# Patient Record
Sex: Female | Born: 1935 | Race: Black or African American | Hispanic: No | Marital: Married | Smoking: Never smoker
Health system: Southern US, Community
[De-identification: ages and names within clinical notes are randomized; demographics above are authoritative.]

## PROBLEM LIST (undated history)

## (undated) DIAGNOSIS — R350 Frequency of micturition: Secondary | ICD-10-CM

## (undated) DIAGNOSIS — G629 Polyneuropathy, unspecified: Secondary | ICD-10-CM

## (undated) DIAGNOSIS — Z8619 Personal history of other infectious and parasitic diseases: Secondary | ICD-10-CM

## (undated) DIAGNOSIS — T7840XA Allergy, unspecified, initial encounter: Secondary | ICD-10-CM

## (undated) DIAGNOSIS — I10 Essential (primary) hypertension: Secondary | ICD-10-CM

## (undated) DIAGNOSIS — R002 Palpitations: Secondary | ICD-10-CM

## (undated) DIAGNOSIS — I639 Cerebral infarction, unspecified: Secondary | ICD-10-CM

## (undated) DIAGNOSIS — I872 Venous insufficiency (chronic) (peripheral): Secondary | ICD-10-CM

## (undated) DIAGNOSIS — E7800 Pure hypercholesterolemia, unspecified: Secondary | ICD-10-CM

## (undated) DIAGNOSIS — M1990 Unspecified osteoarthritis, unspecified site: Secondary | ICD-10-CM

## (undated) DIAGNOSIS — F419 Anxiety disorder, unspecified: Secondary | ICD-10-CM

## (undated) DIAGNOSIS — R011 Cardiac murmur, unspecified: Secondary | ICD-10-CM

## (undated) DIAGNOSIS — K5730 Diverticulosis of large intestine without perforation or abscess without bleeding: Secondary | ICD-10-CM

## (undated) DIAGNOSIS — Z87440 Personal history of urinary (tract) infections: Secondary | ICD-10-CM

## (undated) DIAGNOSIS — K219 Gastro-esophageal reflux disease without esophagitis: Secondary | ICD-10-CM

## (undated) HISTORY — DX: Gastro-esophageal reflux disease without esophagitis: K21.9

## (undated) HISTORY — DX: Diverticulosis of large intestine without perforation or abscess without bleeding: K57.30

## (undated) HISTORY — DX: Allergy, unspecified, initial encounter: T78.40XA

## (undated) HISTORY — DX: Venous insufficiency (chronic) (peripheral): I87.2

## (undated) HISTORY — DX: Pure hypercholesterolemia, unspecified: E78.00

## (undated) HISTORY — PX: TONSILLECTOMY: SUR1361

## (undated) HISTORY — DX: Essential (primary) hypertension: I10

## (undated) HISTORY — DX: Unspecified osteoarthritis, unspecified site: M19.90

## (undated) HISTORY — DX: Frequency of micturition: R35.0

## (undated) HISTORY — DX: Personal history of other infectious and parasitic diseases: Z86.19

## (undated) HISTORY — PX: VESICOVAGINAL FISTULA CLOSURE W/ TAH: SUR271

## (undated) HISTORY — DX: Palpitations: R00.2

## (undated) HISTORY — DX: Personal history of urinary (tract) infections: Z87.440

## (undated) HISTORY — DX: Polyneuropathy, unspecified: G62.9

## (undated) HISTORY — PX: ABDOMINAL HYSTERECTOMY: SHX81

## (undated) HISTORY — DX: Anxiety disorder, unspecified: F41.9

---

## 1998-09-23 ENCOUNTER — Emergency Department (HOSPITAL_COMMUNITY): Admission: EM | Admit: 1998-09-23 | Discharge: 1998-09-23 | Payer: Self-pay | Admitting: Emergency Medicine

## 1999-06-12 ENCOUNTER — Ambulatory Visit (HOSPITAL_COMMUNITY): Admission: RE | Admit: 1999-06-12 | Discharge: 1999-06-12 | Payer: Self-pay | Admitting: Internal Medicine

## 1999-06-12 ENCOUNTER — Encounter: Payer: Self-pay | Admitting: Internal Medicine

## 2000-11-19 ENCOUNTER — Ambulatory Visit (HOSPITAL_COMMUNITY): Admission: RE | Admit: 2000-11-19 | Discharge: 2000-11-19 | Payer: Self-pay | Admitting: Pulmonary Disease

## 2000-11-19 ENCOUNTER — Encounter: Payer: Self-pay | Admitting: Pulmonary Disease

## 2000-11-26 ENCOUNTER — Encounter: Payer: Self-pay | Admitting: Pulmonary Disease

## 2000-11-26 ENCOUNTER — Ambulatory Visit (HOSPITAL_COMMUNITY): Admission: RE | Admit: 2000-11-26 | Discharge: 2000-11-26 | Payer: Self-pay | Admitting: Pulmonary Disease

## 2002-01-17 ENCOUNTER — Emergency Department (HOSPITAL_COMMUNITY): Admission: EM | Admit: 2002-01-17 | Discharge: 2002-01-17 | Payer: Self-pay | Admitting: Emergency Medicine

## 2004-07-31 ENCOUNTER — Emergency Department (HOSPITAL_COMMUNITY): Admission: EM | Admit: 2004-07-31 | Discharge: 2004-07-31 | Payer: Self-pay | Admitting: Emergency Medicine

## 2004-09-11 ENCOUNTER — Ambulatory Visit (HOSPITAL_COMMUNITY): Admission: RE | Admit: 2004-09-11 | Discharge: 2004-09-11 | Payer: Self-pay | Admitting: Internal Medicine

## 2004-10-31 ENCOUNTER — Ambulatory Visit: Payer: Self-pay | Admitting: Pulmonary Disease

## 2005-02-28 ENCOUNTER — Ambulatory Visit: Payer: Self-pay | Admitting: Pulmonary Disease

## 2005-05-01 ENCOUNTER — Ambulatory Visit: Payer: Self-pay | Admitting: Pulmonary Disease

## 2005-06-26 ENCOUNTER — Ambulatory Visit: Payer: Self-pay | Admitting: Pulmonary Disease

## 2005-07-01 ENCOUNTER — Encounter: Admission: RE | Admit: 2005-07-01 | Discharge: 2005-07-01 | Payer: Self-pay | Admitting: Pulmonary Disease

## 2005-07-23 ENCOUNTER — Ambulatory Visit: Payer: Self-pay | Admitting: Pulmonary Disease

## 2005-08-06 ENCOUNTER — Ambulatory Visit: Payer: Self-pay | Admitting: Pulmonary Disease

## 2005-08-15 ENCOUNTER — Ambulatory Visit: Payer: Self-pay

## 2005-08-23 ENCOUNTER — Ambulatory Visit: Payer: Self-pay | Admitting: Pulmonary Disease

## 2005-10-14 ENCOUNTER — Ambulatory Visit: Payer: Self-pay | Admitting: Pulmonary Disease

## 2006-01-13 ENCOUNTER — Ambulatory Visit: Payer: Self-pay | Admitting: Pulmonary Disease

## 2006-02-25 ENCOUNTER — Ambulatory Visit: Payer: Self-pay | Admitting: Pulmonary Disease

## 2006-02-25 ENCOUNTER — Ambulatory Visit (HOSPITAL_COMMUNITY): Admission: RE | Admit: 2006-02-25 | Discharge: 2006-02-25 | Payer: Self-pay | Admitting: Pulmonary Disease

## 2006-03-19 ENCOUNTER — Ambulatory Visit: Payer: Self-pay | Admitting: Pulmonary Disease

## 2006-05-13 ENCOUNTER — Ambulatory Visit: Payer: Self-pay | Admitting: Pulmonary Disease

## 2006-06-04 ENCOUNTER — Ambulatory Visit: Payer: Self-pay

## 2006-06-04 ENCOUNTER — Encounter: Payer: Self-pay | Admitting: Cardiovascular Disease

## 2006-06-20 ENCOUNTER — Ambulatory Visit: Payer: Self-pay | Admitting: Pulmonary Disease

## 2006-08-13 ENCOUNTER — Encounter: Admission: RE | Admit: 2006-08-13 | Discharge: 2006-08-13 | Payer: Self-pay | Admitting: Pulmonary Disease

## 2006-09-10 ENCOUNTER — Ambulatory Visit: Payer: Self-pay | Admitting: Pulmonary Disease

## 2006-09-29 ENCOUNTER — Ambulatory Visit: Payer: Self-pay | Admitting: Pulmonary Disease

## 2006-10-17 ENCOUNTER — Ambulatory Visit: Payer: Self-pay | Admitting: Pulmonary Disease

## 2006-10-17 LAB — CONVERTED CEMR LAB
Bilirubin Urine: NEGATIVE
CO2: 33 meq/L — ABNORMAL HIGH (ref 19–32)
Glucose, Bld: 110 mg/dL — ABNORMAL HIGH (ref 70–99)
Ketones, ur: NEGATIVE mg/dL
Nitrite: NEGATIVE
Phosphorus Concentration: 2.4 mg/dL (ref 2.3–4.6)
Potassium: 3.9 meq/L (ref 3.5–5.1)
Total Protein, Urine: NEGATIVE mg/dL
Urine Glucose: NEGATIVE mg/dL
Urobilinogen, UA: 0.2 (ref 0.0–1.0)
pH: 6 (ref 5.0–8.0)

## 2006-11-17 ENCOUNTER — Ambulatory Visit: Payer: Self-pay | Admitting: Pulmonary Disease

## 2006-11-17 LAB — CONVERTED CEMR LAB
AST: 29 units/L (ref 0–37)
BUN: 11 mg/dL (ref 6–23)
CO2: 35 meq/L — ABNORMAL HIGH (ref 19–32)
Calcium: 10.7 mg/dL — ABNORMAL HIGH (ref 8.4–10.5)
Creatinine, Ser: 0.9 mg/dL (ref 0.4–1.2)
GFR calc non Af Amer: 62 mL/min
Glomerular Filtration Rate, Af Am: 75 mL/min/{1.73_m2}
Total Protein: 6.3 g/dL (ref 6.0–8.3)

## 2006-12-18 ENCOUNTER — Ambulatory Visit: Payer: Self-pay | Admitting: Pulmonary Disease

## 2007-01-12 ENCOUNTER — Ambulatory Visit: Payer: Self-pay | Admitting: Pulmonary Disease

## 2007-01-12 LAB — CONVERTED CEMR LAB
ALT: 16 units/L (ref 0–40)
BUN: 8 mg/dL (ref 6–23)
Basophils Absolute: 0.1 10*3/uL (ref 0.0–0.1)
Basophils Relative: 1.3 % — ABNORMAL HIGH (ref 0.0–1.0)
Calcium: 10.6 mg/dL — ABNORMAL HIGH (ref 8.4–10.5)
Chloride: 106 meq/L (ref 96–112)
Cholesterol: 253 mg/dL (ref 0–200)
Direct LDL: 92.6 mg/dL
Eosinophils Relative: 8.9 % — ABNORMAL HIGH (ref 0.0–5.0)
Glucose, Bld: 95 mg/dL (ref 70–99)
Hemoglobin: 14.5 g/dL (ref 12.0–15.0)
Lymphocytes Relative: 46.7 % — ABNORMAL HIGH (ref 12.0–46.0)
MCV: 84.7 fL (ref 78.0–100.0)
Monocytes Absolute: 0.5 10*3/uL (ref 0.2–0.7)
Neutrophils Relative %: 34.5 % — ABNORMAL LOW (ref 43.0–77.0)
Platelets: 227 10*3/uL (ref 150–400)
Potassium: 3.6 meq/L (ref 3.5–5.1)
TSH: 2.03 microintl units/mL (ref 0.35–5.50)
Total CHOL/HDL Ratio: 2.1
Total Protein: 7 g/dL (ref 6.0–8.3)
Triglycerides: 125 mg/dL (ref 0–149)
WBC: 5.6 10*3/uL (ref 4.5–10.5)

## 2007-05-12 ENCOUNTER — Ambulatory Visit: Payer: Self-pay | Admitting: Pulmonary Disease

## 2007-07-02 ENCOUNTER — Ambulatory Visit: Payer: Self-pay | Admitting: Pulmonary Disease

## 2007-07-02 LAB — CONVERTED CEMR LAB
BUN: 9 mg/dL (ref 6–23)
Basophils Relative: 0.4 % (ref 0.0–1.0)
CO2: 36 meq/L — ABNORMAL HIGH (ref 19–32)
Calcium: 10.4 mg/dL (ref 8.4–10.5)
Creatinine, Ser: 0.9 mg/dL (ref 0.4–1.2)
Crystals: NEGATIVE
Eosinophils Absolute: 0.4 10*3/uL (ref 0.0–0.6)
Eosinophils Relative: 6.8 % — ABNORMAL HIGH (ref 0.0–5.0)
Glucose, Bld: 87 mg/dL (ref 70–99)
HCT: 38.9 % (ref 36.0–46.0)
Hemoglobin: 13.3 g/dL (ref 12.0–15.0)
Ketones, ur: NEGATIVE mg/dL
Lymphocytes Relative: 39.8 % (ref 12.0–46.0)
MCV: 84.3 fL (ref 78.0–100.0)
Monocytes Absolute: 0.5 10*3/uL (ref 0.2–0.7)
Neutro Abs: 2.5 10*3/uL (ref 1.4–7.7)
Neutrophils Relative %: 43.4 % (ref 43.0–77.0)
Total Protein, Urine: NEGATIVE mg/dL
Urine Glucose: NEGATIVE mg/dL
Urobilinogen, UA: 0.2 (ref 0.0–1.0)
WBC: 5.6 10*3/uL (ref 4.5–10.5)

## 2007-07-03 ENCOUNTER — Encounter: Payer: Self-pay | Admitting: Pulmonary Disease

## 2007-07-30 ENCOUNTER — Ambulatory Visit: Payer: Self-pay | Admitting: Pulmonary Disease

## 2007-08-10 ENCOUNTER — Ambulatory Visit: Payer: Self-pay | Admitting: Pulmonary Disease

## 2007-09-23 ENCOUNTER — Encounter: Admission: RE | Admit: 2007-09-23 | Discharge: 2007-09-23 | Payer: Self-pay | Admitting: Pulmonary Disease

## 2007-10-28 ENCOUNTER — Ambulatory Visit: Payer: Self-pay | Admitting: Pulmonary Disease

## 2008-01-26 ENCOUNTER — Ambulatory Visit: Payer: Self-pay | Admitting: Pulmonary Disease

## 2008-02-07 DIAGNOSIS — F0390 Unspecified dementia without behavioral disturbance: Secondary | ICD-10-CM

## 2008-02-07 DIAGNOSIS — I10 Essential (primary) hypertension: Secondary | ICD-10-CM

## 2008-02-07 DIAGNOSIS — J209 Acute bronchitis, unspecified: Secondary | ICD-10-CM | POA: Insufficient documentation

## 2008-02-07 DIAGNOSIS — M810 Age-related osteoporosis without current pathological fracture: Secondary | ICD-10-CM | POA: Insufficient documentation

## 2008-02-07 DIAGNOSIS — E7800 Pure hypercholesterolemia, unspecified: Secondary | ICD-10-CM | POA: Insufficient documentation

## 2008-02-07 DIAGNOSIS — F411 Generalized anxiety disorder: Secondary | ICD-10-CM | POA: Insufficient documentation

## 2008-02-07 DIAGNOSIS — M1990 Unspecified osteoarthritis, unspecified site: Secondary | ICD-10-CM

## 2008-02-07 DIAGNOSIS — K5730 Diverticulosis of large intestine without perforation or abscess without bleeding: Secondary | ICD-10-CM | POA: Insufficient documentation

## 2008-02-07 LAB — CONVERTED CEMR LAB
Albumin: 3.9 g/dL (ref 3.5–5.2)
Basophils Absolute: 0 10*3/uL (ref 0.0–0.1)
Basophils Relative: 0.4 % (ref 0.0–1.0)
Bilirubin, Direct: 0.3 mg/dL (ref 0.0–0.3)
Eosinophils Absolute: 0.2 10*3/uL (ref 0.0–0.6)
Eosinophils Relative: 4.4 % (ref 0.0–5.0)
GFR calc non Af Amer: 62 mL/min
Glucose, Bld: 79 mg/dL (ref 70–99)
HDL: 135.9 mg/dL (ref >39.0)
LDL Cholesterol: 44 mg/dL (ref 0–99)
Monocytes Relative: 8.2 % (ref 3.0–11.0)
Neutro Abs: 2.2 10*3/uL (ref 1.4–7.7)
Platelets: 205 10*3/uL (ref 150–400)
Potassium: 3.9 meq/L (ref 3.5–5.1)
RBC: 4.79 M/uL (ref 3.87–5.11)
RDW: 13.1 % (ref 11.5–14.6)
Sodium: 143 meq/L (ref 135–145)
TSH: 1.67 microintl units/mL (ref 0.35–5.50)
Triglycerides: 99 mg/dL (ref 0–149)
VLDL: 20 mg/dL (ref 0–40)
Vit D, 1,25-Dihydroxy: 34 (ref 30–89)
WBC: 5.3 10*3/uL (ref 4.5–10.5)

## 2008-02-09 ENCOUNTER — Telehealth (INDEPENDENT_AMBULATORY_CARE_PROVIDER_SITE_OTHER): Payer: Self-pay | Admitting: *Deleted

## 2008-04-01 ENCOUNTER — Ambulatory Visit: Payer: Self-pay | Admitting: Pulmonary Disease

## 2008-06-01 ENCOUNTER — Telehealth (INDEPENDENT_AMBULATORY_CARE_PROVIDER_SITE_OTHER): Payer: Self-pay | Admitting: *Deleted

## 2008-06-13 ENCOUNTER — Ambulatory Visit: Payer: Self-pay | Admitting: Pulmonary Disease

## 2008-08-19 ENCOUNTER — Ambulatory Visit: Payer: Self-pay | Admitting: Internal Medicine

## 2008-08-19 DIAGNOSIS — I872 Venous insufficiency (chronic) (peripheral): Secondary | ICD-10-CM

## 2008-08-19 LAB — CONVERTED CEMR LAB
Bacteria, UA: NEGATIVE
Crystals: NEGATIVE
Hemoglobin, Urine: NEGATIVE
Ketones, ur: NEGATIVE mg/dL
RBC / HPF: NONE SEEN
Specific Gravity, Urine: 1.01 (ref 1.000–1.03)
Urobilinogen, UA: 0.2 (ref 0.0–1.0)

## 2008-08-20 ENCOUNTER — Encounter: Payer: Self-pay | Admitting: Adult Health

## 2008-09-29 ENCOUNTER — Encounter: Admission: RE | Admit: 2008-09-29 | Discharge: 2008-09-29 | Payer: Self-pay | Admitting: Pulmonary Disease

## 2008-10-11 ENCOUNTER — Ambulatory Visit: Payer: Self-pay | Admitting: Pulmonary Disease

## 2009-02-02 ENCOUNTER — Ambulatory Visit: Payer: Self-pay | Admitting: Pulmonary Disease

## 2009-03-16 ENCOUNTER — Telehealth (INDEPENDENT_AMBULATORY_CARE_PROVIDER_SITE_OTHER): Payer: Self-pay | Admitting: *Deleted

## 2009-03-31 ENCOUNTER — Telehealth (INDEPENDENT_AMBULATORY_CARE_PROVIDER_SITE_OTHER): Payer: Self-pay | Admitting: *Deleted

## 2009-04-05 ENCOUNTER — Ambulatory Visit: Payer: Self-pay | Admitting: Internal Medicine

## 2009-04-05 ENCOUNTER — Encounter: Payer: Self-pay | Admitting: Adult Health

## 2009-04-11 ENCOUNTER — Telehealth (INDEPENDENT_AMBULATORY_CARE_PROVIDER_SITE_OTHER): Payer: Self-pay | Admitting: *Deleted

## 2009-04-11 LAB — CONVERTED CEMR LAB
BUN: 11 mg/dL (ref 6–23)
Basophils Absolute: 0 10*3/uL (ref 0.0–0.1)
Calcium: 10.5 mg/dL (ref 8.4–10.5)
Creatinine, Ser: 0.8 mg/dL (ref 0.4–1.2)
Eosinophils Relative: 4.8 % (ref 0.0–5.0)
GFR calc non Af Amer: 85.63 mL/min (ref >60)
Glucose, Bld: 78 mg/dL (ref 70–99)
HCT: 39.3 % (ref 36.0–46.0)
Lymphocytes Relative: 33.5 % (ref 12.0–46.0)
Lymphs Abs: 1.8 10*3/uL (ref 0.7–4.0)
Monocytes Relative: 8.8 % (ref 3.0–12.0)
Neutrophils Relative %: 52.7 % (ref 43.0–77.0)
Platelets: 204 10*3/uL (ref 150.0–400.0)
Potassium: 3.8 meq/L (ref 3.5–5.1)
RDW: 13.8 % (ref 11.5–14.6)
TSH: 1.53 microintl units/mL (ref 0.35–5.50)
Vit D, 25-Hydroxy: 26 ng/mL — ABNORMAL LOW (ref 30–89)
WBC: 5.4 10*3/uL (ref 4.5–10.5)

## 2009-04-13 ENCOUNTER — Encounter: Payer: Self-pay | Admitting: Adult Health

## 2009-04-18 ENCOUNTER — Telehealth (INDEPENDENT_AMBULATORY_CARE_PROVIDER_SITE_OTHER): Payer: Self-pay | Admitting: *Deleted

## 2009-05-23 ENCOUNTER — Telehealth (INDEPENDENT_AMBULATORY_CARE_PROVIDER_SITE_OTHER): Payer: Self-pay | Admitting: *Deleted

## 2009-05-24 ENCOUNTER — Ambulatory Visit: Payer: Self-pay | Admitting: Pulmonary Disease

## 2009-06-21 ENCOUNTER — Ambulatory Visit: Payer: Self-pay | Admitting: Pulmonary Disease

## 2009-06-21 DIAGNOSIS — J069 Acute upper respiratory infection, unspecified: Secondary | ICD-10-CM | POA: Insufficient documentation

## 2009-07-03 ENCOUNTER — Ambulatory Visit: Payer: Self-pay | Admitting: Pulmonary Disease

## 2009-07-03 ENCOUNTER — Telehealth (INDEPENDENT_AMBULATORY_CARE_PROVIDER_SITE_OTHER): Payer: Self-pay | Admitting: *Deleted

## 2009-07-04 ENCOUNTER — Ambulatory Visit: Payer: Self-pay | Admitting: Pulmonary Disease

## 2009-07-05 ENCOUNTER — Ambulatory Visit: Payer: Self-pay | Admitting: Cardiovascular Disease

## 2009-07-06 LAB — CONVERTED CEMR LAB
ALT: 18 U/L
AST: 32 U/L
Albumin: 4.2 g/dL
Alkaline Phosphatase: 53 U/L
BUN: 18 mg/dL
Bilirubin, Direct: 0.2 mg/dL
CO2: 32 meq/L
Calcium: 10.8 mg/dL — ABNORMAL HIGH
Chloride: 100 meq/L
Creatinine, Ser: 0.8 mg/dL
GFR calc non Af Amer: 85.58 mL/min
Glucose, Bld: 95 mg/dL
HCT: 42.5 %
Hemoglobin: 13.9 g/dL
MCHC: 32.7 g/dL
MCV: 86.8 fL
Platelets: 225 K/uL
Potassium: 5 meq/L
RBC: 4.9 M/uL
RDW: 13.4 %
Sed Rate: 9 mm/h
Sodium: 140 meq/L
TSH: 2.43 u[IU]/mL
Total Bilirubin: 1 mg/dL
Total Protein: 7.2 g/dL
WBC: 6.8 10*3/microliter

## 2009-07-26 ENCOUNTER — Ambulatory Visit: Payer: Self-pay | Admitting: Adult Health

## 2009-07-26 ENCOUNTER — Encounter (INDEPENDENT_AMBULATORY_CARE_PROVIDER_SITE_OTHER): Payer: Self-pay | Admitting: *Deleted

## 2009-07-26 DIAGNOSIS — R002 Palpitations: Secondary | ICD-10-CM | POA: Insufficient documentation

## 2009-07-28 ENCOUNTER — Telehealth (INDEPENDENT_AMBULATORY_CARE_PROVIDER_SITE_OTHER): Payer: Self-pay | Admitting: *Deleted

## 2009-07-28 ENCOUNTER — Other Ambulatory Visit: Payer: Self-pay | Admitting: Emergency Medicine

## 2009-07-28 ENCOUNTER — Inpatient Hospital Stay (HOSPITAL_COMMUNITY): Admission: EM | Admit: 2009-07-28 | Discharge: 2009-07-29 | Payer: Self-pay | Admitting: Emergency Medicine

## 2009-07-28 ENCOUNTER — Encounter: Payer: Self-pay | Admitting: Cardiovascular Disease

## 2009-07-28 ENCOUNTER — Ambulatory Visit: Payer: Self-pay | Admitting: Cardiovascular Disease

## 2009-08-01 ENCOUNTER — Telehealth: Payer: Self-pay | Admitting: Pulmonary Disease

## 2009-08-02 ENCOUNTER — Ambulatory Visit: Payer: Self-pay | Admitting: Cardiovascular Disease

## 2009-08-09 ENCOUNTER — Telehealth: Payer: Self-pay | Admitting: Pulmonary Disease

## 2009-08-14 ENCOUNTER — Telehealth: Payer: Self-pay | Admitting: Cardiovascular Disease

## 2009-08-17 ENCOUNTER — Telehealth (INDEPENDENT_AMBULATORY_CARE_PROVIDER_SITE_OTHER): Payer: Self-pay | Admitting: *Deleted

## 2009-08-23 ENCOUNTER — Ambulatory Visit: Payer: Self-pay | Admitting: Pulmonary Disease

## 2009-09-11 ENCOUNTER — Ambulatory Visit: Payer: Self-pay | Admitting: Pulmonary Disease

## 2009-09-11 ENCOUNTER — Ambulatory Visit: Payer: Self-pay | Admitting: Cardiovascular Disease

## 2009-09-20 ENCOUNTER — Ambulatory Visit: Payer: Self-pay | Admitting: Pulmonary Disease

## 2009-09-20 DIAGNOSIS — R32 Unspecified urinary incontinence: Secondary | ICD-10-CM

## 2009-10-05 ENCOUNTER — Encounter: Admission: RE | Admit: 2009-10-05 | Discharge: 2009-10-05 | Payer: Self-pay | Admitting: Pulmonary Disease

## 2009-10-17 ENCOUNTER — Ambulatory Visit: Payer: Self-pay | Admitting: Pulmonary Disease

## 2009-10-26 ENCOUNTER — Encounter: Payer: Self-pay | Admitting: Pulmonary Disease

## 2009-10-30 ENCOUNTER — Encounter: Payer: Self-pay | Admitting: Pulmonary Disease

## 2009-11-02 ENCOUNTER — Telehealth: Payer: Self-pay | Admitting: Pulmonary Disease

## 2009-12-04 ENCOUNTER — Encounter: Payer: Self-pay | Admitting: Pulmonary Disease

## 2009-12-27 ENCOUNTER — Ambulatory Visit: Payer: Self-pay | Admitting: Pulmonary Disease

## 2010-02-08 ENCOUNTER — Encounter: Payer: Self-pay | Admitting: Pulmonary Disease

## 2010-02-27 ENCOUNTER — Encounter: Payer: Self-pay | Admitting: Pulmonary Disease

## 2010-02-28 ENCOUNTER — Encounter: Payer: Self-pay | Admitting: Pulmonary Disease

## 2010-03-09 ENCOUNTER — Telehealth (INDEPENDENT_AMBULATORY_CARE_PROVIDER_SITE_OTHER): Payer: Self-pay | Admitting: *Deleted

## 2010-03-13 ENCOUNTER — Encounter: Payer: Self-pay | Admitting: Pulmonary Disease

## 2010-03-28 ENCOUNTER — Ambulatory Visit: Payer: Self-pay | Admitting: Pulmonary Disease

## 2010-03-29 ENCOUNTER — Encounter: Payer: Self-pay | Admitting: Pulmonary Disease

## 2010-04-01 LAB — CONVERTED CEMR LAB
ALT: 19 units/L (ref 0–35)
AST: 30 units/L (ref 0–37)
Albumin: 4.1 g/dL (ref 3.5–5.2)
BUN: 10 mg/dL (ref 6–23)
Basophils Relative: 1 % (ref 0.0–3.0)
Chloride: 101 meq/L (ref 96–112)
Cholesterol: 244 mg/dL — ABNORMAL HIGH (ref 0–200)
Creatinine, Ser: 0.9 mg/dL (ref 0.4–1.2)
Eosinophils Relative: 3.8 % (ref 0.0–5.0)
GFR calc non Af Amer: 74.59 mL/min (ref >60)
Glucose, Bld: 91 mg/dL (ref 70–99)
HDL: 149 mg/dL (ref >39.00)
Lymphocytes Relative: 31.7 % (ref 12.0–46.0)
Monocytes Relative: 7.3 % (ref 3.0–12.0)
Neutrophils Relative %: 56.2 % (ref 43.0–77.0)
Platelets: 225 10*3/uL (ref 150.0–400.0)
Potassium: 3.7 meq/L (ref 3.5–5.1)
RBC: 4.75 M/uL (ref 3.87–5.11)
Total CHOL/HDL Ratio: 2
Triglycerides: 143 mg/dL (ref 0.0–149.0)
VLDL: 28.6 mg/dL (ref 0.0–40.0)
WBC: 4.8 10*3/uL (ref 4.5–10.5)

## 2010-04-10 ENCOUNTER — Telehealth: Payer: Self-pay | Admitting: Pulmonary Disease

## 2010-04-30 ENCOUNTER — Telehealth: Payer: Self-pay | Admitting: Pulmonary Disease

## 2010-05-01 ENCOUNTER — Telehealth (INDEPENDENT_AMBULATORY_CARE_PROVIDER_SITE_OTHER): Payer: Self-pay | Admitting: *Deleted

## 2010-05-01 DIAGNOSIS — G629 Polyneuropathy, unspecified: Secondary | ICD-10-CM

## 2010-05-08 ENCOUNTER — Encounter: Payer: Self-pay | Admitting: Pulmonary Disease

## 2010-05-10 ENCOUNTER — Telehealth (INDEPENDENT_AMBULATORY_CARE_PROVIDER_SITE_OTHER): Payer: Self-pay | Admitting: *Deleted

## 2010-05-11 ENCOUNTER — Ambulatory Visit: Payer: Self-pay

## 2010-05-11 ENCOUNTER — Ambulatory Visit: Payer: Self-pay | Admitting: Pulmonary Disease

## 2010-05-11 ENCOUNTER — Encounter: Payer: Self-pay | Admitting: Adult Health

## 2010-05-17 LAB — CONVERTED CEMR LAB
BUN: 17 mg/dL (ref 6–23)
CO2: 35 meq/L — ABNORMAL HIGH (ref 19–32)
Calcium: 11.2 mg/dL — ABNORMAL HIGH (ref 8.4–10.5)
Creatinine, Ser: 0.9 mg/dL (ref 0.4–1.2)
Glucose, Bld: 78 mg/dL (ref 70–99)
Sodium: 145 meq/L (ref 135–145)
Uric Acid, Serum: 3.6 mg/dL (ref 2.4–7.0)

## 2010-05-24 ENCOUNTER — Ambulatory Visit: Payer: Self-pay | Admitting: Pulmonary Disease

## 2010-05-30 ENCOUNTER — Telehealth (INDEPENDENT_AMBULATORY_CARE_PROVIDER_SITE_OTHER): Payer: Self-pay | Admitting: *Deleted

## 2010-06-04 ENCOUNTER — Encounter: Payer: Self-pay | Admitting: Pulmonary Disease

## 2010-06-04 ENCOUNTER — Telehealth (INDEPENDENT_AMBULATORY_CARE_PROVIDER_SITE_OTHER): Payer: Self-pay | Admitting: *Deleted

## 2010-06-08 ENCOUNTER — Telehealth: Payer: Self-pay | Admitting: Pulmonary Disease

## 2010-06-13 ENCOUNTER — Ambulatory Visit: Payer: Self-pay | Admitting: Pulmonary Disease

## 2010-07-05 ENCOUNTER — Telehealth: Payer: Self-pay | Admitting: Pulmonary Disease

## 2010-07-17 ENCOUNTER — Encounter: Payer: Self-pay | Admitting: Pulmonary Disease

## 2010-07-19 ENCOUNTER — Encounter: Payer: Self-pay | Admitting: Pulmonary Disease

## 2010-08-02 ENCOUNTER — Telehealth (INDEPENDENT_AMBULATORY_CARE_PROVIDER_SITE_OTHER): Payer: Self-pay | Admitting: *Deleted

## 2010-09-18 ENCOUNTER — Ambulatory Visit: Payer: Self-pay | Admitting: Pulmonary Disease

## 2010-10-03 ENCOUNTER — Telehealth: Payer: Self-pay | Admitting: Pulmonary Disease

## 2010-10-11 ENCOUNTER — Encounter: Admission: RE | Admit: 2010-10-11 | Discharge: 2010-10-11 | Payer: Self-pay | Admitting: Pulmonary Disease

## 2010-10-11 ENCOUNTER — Ambulatory Visit: Payer: Self-pay | Admitting: Pulmonary Disease

## 2010-10-22 ENCOUNTER — Telehealth (INDEPENDENT_AMBULATORY_CARE_PROVIDER_SITE_OTHER): Payer: Self-pay | Admitting: *Deleted

## 2010-10-25 ENCOUNTER — Encounter: Payer: Self-pay | Admitting: Pulmonary Disease

## 2010-11-30 ENCOUNTER — Ambulatory Visit: Payer: Self-pay | Admitting: Pulmonary Disease

## 2010-11-30 ENCOUNTER — Telehealth: Payer: Self-pay | Admitting: Pulmonary Disease

## 2010-12-26 ENCOUNTER — Other Ambulatory Visit: Payer: Self-pay | Admitting: Pulmonary Disease

## 2010-12-26 ENCOUNTER — Ambulatory Visit
Admission: RE | Admit: 2010-12-26 | Discharge: 2010-12-26 | Payer: Self-pay | Source: Home / Self Care | Attending: Pulmonary Disease | Admitting: Pulmonary Disease

## 2010-12-26 LAB — LIPID PANEL
Cholesterol: 266 mg/dL — ABNORMAL HIGH (ref 0–200)
HDL: 150.8 mg/dL (ref >39.00)
Total CHOL/HDL Ratio: 2
Triglycerides: 102 mg/dL (ref 0.0–149.0)
VLDL: 20.4 mg/dL (ref 0.0–40.0)

## 2010-12-26 LAB — CBC WITH DIFFERENTIAL/PLATELET
Basophils Absolute: 0.1 10*3/uL (ref 0.0–0.1)
Basophils Relative: 1 % (ref 0.0–3.0)
Eosinophils Absolute: 0.1 10*3/uL (ref 0.0–0.7)
Eosinophils Relative: 1.6 % (ref 0.0–5.0)
HCT: 40.3 % (ref 36.0–46.0)
Hemoglobin: 13.4 g/dL (ref 12.0–15.0)
Lymphocytes Relative: 34.5 % (ref 12.0–46.0)
Lymphs Abs: 1.8 10*3/uL (ref 0.7–4.0)
MCHC: 33.3 g/dL (ref 30.0–36.0)
MCV: 87.8 fl (ref 78.0–100.0)
Monocytes Absolute: 0.5 10*3/uL (ref 0.1–1.0)
Monocytes Relative: 9.9 % (ref 3.0–12.0)
Neutro Abs: 2.8 10*3/uL (ref 1.4–7.7)
Neutrophils Relative %: 53 % (ref 43.0–77.0)
Platelets: 211 10*3/uL (ref 150.0–400.0)
RBC: 4.59 Mil/uL (ref 3.87–5.11)
RDW: 14.8 % — ABNORMAL HIGH (ref 11.5–14.6)
WBC: 5.4 10*3/uL (ref 4.5–10.5)

## 2010-12-26 LAB — BASIC METABOLIC PANEL
BUN: 16 mg/dL (ref 6–23)
CO2: 32 mEq/L (ref 19–32)
Calcium: 11.7 mg/dL — ABNORMAL HIGH (ref 8.4–10.5)
Chloride: 104 mEq/L (ref 96–112)
Creatinine, Ser: 0.9 mg/dL (ref 0.4–1.2)
GFR: 73.53 mL/min (ref >60.00)
Glucose, Bld: 72 mg/dL (ref 70–99)
Potassium: 4 mEq/L (ref 3.5–5.1)
Sodium: 144 mEq/L (ref 135–145)

## 2010-12-26 LAB — LDL CHOLESTEROL, DIRECT: Direct LDL: 82.9 mg/dL

## 2010-12-26 LAB — HEPATIC FUNCTION PANEL
ALT: 13 U/L (ref 0–35)
AST: 25 U/L (ref 0–37)
Albumin: 4.1 g/dL (ref 3.5–5.2)
Alkaline Phosphatase: 54 U/L (ref 39–117)
Bilirubin, Direct: 0.1 mg/dL (ref 0.0–0.3)
Total Bilirubin: 0.6 mg/dL (ref 0.3–1.2)
Total Protein: 6.5 g/dL (ref 6.0–8.3)

## 2010-12-26 LAB — TSH: TSH: 1.49 u[IU]/mL (ref 0.35–5.50)

## 2011-01-06 ENCOUNTER — Encounter: Payer: Self-pay | Admitting: Pulmonary Disease

## 2011-01-06 ENCOUNTER — Encounter: Payer: Self-pay | Admitting: Internal Medicine

## 2011-01-15 NOTE — Assessment & Plan Note (Signed)
Summary: flu shot/mhh  Nurse Visit   Allergies: No Known Drug Allergies  Orders Added: 1)  Flu Vaccine 77yrs + MEDICARE PATIENTS [Q2039] 2)  Administration Flu vaccine - MCR [G0008] Flu Vaccine Consent Questions     Do you have a history of severe allergic reactions to this vaccine? no    Any prior history of allergic reactions to egg and/or gelatin? no    Do you have a sensitivity to the preservative Thimersol? no    Do you have a past history of Guillan-Barre Syndrome? no    Do you currently have an acute febrile illness? no    Have you ever had a severe reaction to latex? no    Vaccine information given and explained to patient? yes    Are you currently pregnant? no    Lot Number:[id number]   Exp Date:06/15/2011   Site Given  Left Deltoid IMu  Clarise Cruz [facility])  September 18, 2010 2:08 PM

## 2011-01-15 NOTE — Progress Notes (Signed)
Summary: elevated BP  Phone Note Call from Patient Call back at Home Phone [phone]   Caller: PT - Jessica [name] Call For: Jessica [name] Reason for Call: Talk to Nurse Summary of Call: Pt's BP was 182/85 before they started therapy.  Pt has taken her BP med for today, she takes another one before bed.  Please advise pt. Initial call taken by: Eugene Gavia,  March 09, 2010 3:41 PM  Follow-up for Phone Call        called and spoke with pt.  pt states PT took her bp recently today and it was 185/89.  Pt states she took her Norvasc 5mg  this morning as scheduled and will take the Lisinopril 20mg  this evening.  Pt states this is the first day her bp was this elevated.  pt denied headache or blurred vision.  pt also mentioned that she was very nervous today and hadn't taken her Xanax yet when she had her bp checked.  Pt has since taken her Xanax and feels less nervous..  I informed pt that the one-time increase in her bp probably was d/t her anxiousness/nervousness but will check with SN to get his opinion.  please advise.  Aundra Millet Reynolds LPN  March 09, 2010 4:50 PM   Additional Follow-up for Phone Call Additional follow up Details #1::        per SN----one time BP being elevated is not a big concern---need to know if her BP is still elevated now or if it has come back down. if her BP is still elevated then have her take another norvasc 5mg  and take the lisinopril at bedtime and keep a check on her BP at home.  called pt to get more info but no one is at home and unable to leave a message. Randell Loop CMA  March 09, 2010 4:58 PM     Additional Follow-up for Phone Call Additional follow up Details #2::    called but no answer x 2.   Randell Loop CMA  March 13, 2010 2:19 PM     128/65 bp today---just checked earlier by scott and pt stated that she has been doing pretty good with her BP. Randell Loop CMA  March 13, 2010 4:07 PM

## 2011-01-15 NOTE — Assessment & Plan Note (Signed)
Summary: NP follow up - left leg edema   Primary Provider/Referring Provider:  Alroy Dust, MD  CC:  2 week follow up for left leg edema .  History of Present Illness: 77 year old female with known hx HTN, osteoporosis, DJD, dementia.    ~  Jun10:  follow up visit w/ mult somatic complaints:  1) swelling in legs that tends to decr overnight, on Lasix 20mg /d & we discussed no salt & incr Lasix to 40mg /d;  2) LBP when getting up in the AM- asked to check her mattress, Rx ESTylenol;  3) nasal drainage x 90mo- we discussed treatment regimen w/ Zyrtek, Saline, Astepro Prn...   ~  Jul10:  walk-in today (here w/ her daughter) w/ mult somatic complaints- weight loss, weakness, intermittent dizzy & ringing, persistant bladder problems, etc... she states appetite is fair & review of chart shows weights over the last yr betw 123-129#... today 117# w/ 1-2+edema in feet at present... we prev discussed supplements but she isn't taking them regularly... she is concerned and wants further eval... she has been weak & intermiitently dizzy- BP is trending low on her meds and we discussed decreasing Lisinopril20- 1/2 tab, Amlodipine5- 1/2 tab, Lasix20- 1tab daily...   July 26, 2009 - acute OV - pt presents with 1 week hx intermittent visible "rapid fluttering" of the vessels in her antecubital area.     May 11, 2010--Presents for increased swelling and pain iin ankles/feet. She has a hx of venous insufficiency w/ ankle edema on lasix 20mg  once daily. This is not getting any better and feet hurt daily. She went to neuro this past week for possible neuropathy but was told she did not  have neuropathy per pt. (no records available yet). Swelling worse in evening, sore around ankles , top of feet and pain w/ walking at times. Feet are getting so big in evening she cant fit into shoes.   May 24, 2010--Presents for a  2 week follow up for left leg edema. Last visit labs w/ good renal fxn, nml bnp, venous doppler neg for  DVT. Felt this was secondary to venous insufficiency. Advised on extra diuretics, low salt diet and teds stocking and norvasc stopped ?edema side effect. She has decreased swelling in am, extra lasix helped. Denies chest pain.   Medications Prior to Update: 1)  [facility]vs Saline Nasal Spray 0.65 % Soln (Saline) .... As Needed 2)  Adult Aspirin Low Strength 81 Mg  Tbdp (Aspirin) .Marland Kitchen.. 1 By Mouth Once Daily 3)  Norvasc 5 Mg  Tabs (Amlodipine Besylate) .... Take 1 Tab By Mouth Once Daily 4)  Lisinopril 20 Mg  Tabs (Lisinopril) .... Take 1 Tablet By Mouth Once A Day 5)  Furosemide 20 Mg  Tabs (Furosemide) .... Take 1/2 To 1 Tab By Mouth Each Morning For Swelling... 6)  Caltrate 600+d 600-400 Mg-Unit  Tabs (Calcium Carbonate-Vitamin D) .Marland Kitchen.. 1 Tab Twice Daily.Marland KitchenMarland Kitchen 7)  Multivitamins  Tabs (Multiple Vitamin) .... Take 1 Tablet By Mouth Once A Day 8)  Vitamin D 1000 Unit Caps (Cholecalciferol) .... Take 1 Cap By Mouth Once Daily.Marland KitchenMarland Kitchen 9)  Aricept 10 Mg  Tabs (Donepezil Hcl) .Marland Kitchen.. 1 By Mouth Once Daily 10)  Alprazolam 0.25 Mg  Tabs (Alprazolam) .... 1/2 To 1 Tab By Mouth Three Times A Day As Needed For Nerves...  Current Medications (verified): 1)  Cvs Saline Nasal Spray 0.65 % Soln (Saline) .... As Needed 2)  Adult Aspirin Low Strength 81 Mg  Tbdp (Aspirin) .Marland KitchenMarland KitchenMarland Kitchen  1 By Mouth Once Daily 3)  Norvasc 5 Mg  Tabs (Amlodipine Besylate) .... Take 1 Tab By Mouth Once Daily 4)  Lisinopril 20 Mg  Tabs (Lisinopril) .... Take 1 Tablet By Mouth Once A Day 5)  Furosemide 20 Mg  Tabs (Furosemide) .... Take 1/2 To 1 Tab By Mouth Each Morning For Swelling... 6)  Caltrate 600+d 600-400 Mg-Unit  Tabs (Calcium Carbonate-Vitamin D) .Marland Kitchen.. 1 Tab Twice Daily.Marland KitchenMarland Kitchen 7)  Multivitamins  Tabs (Multiple Vitamin) .... Take 1 Tablet By Mouth Once A Day 8)  Vitamin D 1000 Unit Caps (Cholecalciferol) .... Take 1 Cap By Mouth Once Daily.Marland KitchenMarland Kitchen 9)  Aricept 10 Mg  Tabs (Donepezil Hcl) .Marland Kitchen.. 1 By Mouth Once Daily 10)  Alprazolam 0.25 Mg  Tabs (Alprazolam) ....  1/2 To 1 Tab By Mouth Three Times A Day As Needed For Nerves...  Allergies (verified): No Known Drug Allergies  Past History:  Family History: Last updated: 08/26/2009  Both of her parents are deceased and her father had a  stroke, but neither one had heart disease.  She has one brother who died  at age 75 of pneumonia.      Social History: Last updated: 08/26/2009  She lives in [place] with her husband.  They have  been married almost 60 years.  She is a retired Runner, broadcasting/film/video.  She has no  history of alcohol, tobacco, or drug abuse.   Risk Factors: Smoking Status: never (02/02/2009)  Past Medical History: Hx of ALLERGIES (ICD-995.3) - she uses OTC meds as needed, & we prev discussed Zyrtek 10mg Qam, Saline nasal mist, & Astepro 1-2 sp Bid for drainage (one of her chronic complaints)...  Hx of ASTHMATIC BRONCHITIS, ACUTE (ICD-466.0) - no recent exacerbations...  HYPERTENSION (ICD-401.9) - controlled  on meds   VENOUS INSUFFICIENCY, CHRONIC (ICD-459.81) - chronic pedal edema on . on low sodium diet, elevation, support hose- and lasix 20mg  once daily --norvasc stopped 05/2010.   HYPERCHOLESTEROLEMIA (ICD-272.0) - prev on Lipitor 10mg /d, but she stopped this on her own... she has a very high HDL!!!  ~  FLP was 1/08 showed TChol 253, TG 125, HDL 121, LDL 93  ~  FLP 2/09 showed TChol 200, TG 99, HDL 136, LDL 44  ~  FLP 4/11 showed TChol 244, TG 143, HDL 149, LDL 59  GERD (ICD-530.81) - last EGD was 9/05 by DrPerry showing GERD and esoph stricture- dilated...  DIVERTICULOSIS OF COLON (ICD-562.10) - last colonoscopy was 11/00 showing divertics only...  HX, URINARY INFECTION (ICD-V13.02) & INCONTINENCE symptoms... she has seen DrTannenbaum & staff    ~  7/10:  offered f/u appt w/ Urology vs second opinion consult but she declines...  ~  9/10:  wrote for trial Enablex 7.5mg /d >> no benefit she says.  ~  12/10: saw DrNesi- tried sample med, sl improved, but symptoms not bad enough for more  meds, she says.  DEGENERATIVE JOINT DISEASE (ICD-715.90) -      Review of Systems      See HPI  Vital Signs:  Patient profile:   77 year old female Height:      60 inches Weight:      121 pounds BMI:     23.72 O2 Sat:      97 % on Room air Temp:     97.3 degrees F oral Pulse rate:   60 / minute BP sitting:   120 / 70  (left arm) Cuff size:   regular  Vitals Entered By: Boone Master CNA/MA (May 24, 2010 10:10 AM)  O2 Flow:  Room air CC: 2 week follow up for left leg edema  Is Patient Diabetic? No Comments Medications reviewed with patient Daytime contact number verified with patient. Boone Master CNA/MA  May 24, 2010 10:09 AM    Physical Exam  Additional Exam:  WD, WN, 77 y/o BF in NAD.Marland Kitchenappears younger than stated age.  GENERAL:  Alert, pleasant & cooperative HEENT:  EACs-clear, TMs-wnl, NOSE-clear, THROAT-clear & wnl, mm moist NECK:  Supple, no JVD; normal carotid impulses w/o bruits; no thyromegaly or nodules palpated; no lymphadenopathy. CHEST:  Clear to P & A; without wheezes/ rales/ or rhonchi., noted kyphosis .  HEART:  Regular Rhythm; without murmurs/ rubs/ or gallops. ABDOMEN:  Soft & nontender; normal bowel sounds; no organomegaly or masses detected. EXT: without deformities, mod arthritic changes; no varicose veins/ +venous insuffic. 1-2+ BLE edema along ankles/feet, neg homans sign, pulses intact bialterall NEURO:   Alert, answers questions appropriately.  no focal neuro deficits... DERM:  warm and dry, No lesions noted; no rash etc...     Impression & Recommendations:  Problem # 1:  VENOUS INSUFFICIENCY, CHRONIC (ICD-459.81)  Chronic venous insufficiency of lower ext. w/ neg neuro w/up for neuropathy, neg venous doppler, labs unrevealing.  REC:  May take Furosemide 20mg  1-2 tabs once daily for swelling.   low salt diet, keep legs elevated. support stocking daily and remove at bedtime.  follow up Dr. Kriste Basque as scheduled  Please contact office for  sooner follow up if symptoms do not improve or worsen   Orders: Est. Patient Level II (27253)  Problem # 2:  HYPERTENSION (ICD-401.9) controlled off norvasc The following medications were removed from the medication list:    Norvasc 5 Mg Tabs (Amlodipine besylate) .Marland Kitchen... Take 1 tab by mouth once daily Her updated medication list for this problem includes:    Lisinopril 20 Mg Tabs (Lisinopril) .Marland Kitchen... Take 1 tablet by mouth once a day    Furosemide 20 Mg Tabs (Furosemide) .Marland Kitchen... Take 1/2 to 1 tab by mouth each morning for swelling...  Orders: Est. Patient Level II (66440)  Complete Medication List: 1)  Cvs Saline Nasal Spray 0.65 % Soln (Saline) .... As needed 2)  Adult Aspirin Low Strength 81 Mg Tbdp (Aspirin) .Marland Kitchen.. 1 by mouth once daily 3)  Lisinopril 20 Mg Tabs (Lisinopril) .... Take 1 tablet by mouth once a day 4)  Furosemide 20 Mg Tabs (Furosemide) .... Take 1/2 to 1 tab by mouth each morning for swelling... 5)  Caltrate 600+d 600-400 Mg-unit Tabs (Calcium carbonate-vitamin d) .Marland Kitchen.. 1 tab twice daily.Marland KitchenMarland Kitchen 6)  Multivitamins Tabs (Multiple vitamin) .... Take 1 tablet by mouth once a day 7)  Vitamin D 1000 Unit Caps (Cholecalciferol) .... Take 1 cap by mouth once daily.Marland KitchenMarland Kitchen 8)  Aricept 10 Mg Tabs (Donepezil hcl) .Marland Kitchen.. 1 by mouth once daily 9)  Alprazolam 0.25 Mg Tabs (Alprazolam) .... 1/2 to 1 tab by mouth three times a day as needed for nerves...  Patient Instructions: 1)  May take Furosemide 20mg  1-2 tabs once daily for swelling.   2)  low salt diet, keep legs elevated. support stocking daily and remove at bedtime.  3)  follow up Dr. Kriste Basque as scheduled  4)  Please contact office for sooner follow up if symptoms do not improve or worsen

## 2011-01-15 NOTE — Miscellaneous (Signed)
Summary: Order/[facility]  Order/[facility]   Imported By: Lester  08/01/2010 08:26:38  _____________________________________________________________________  External Attachment:    Type:   Image     Comment:   External Document

## 2011-01-15 NOTE — Progress Notes (Signed)
Summary: r hip pain  Phone Note Call from Patient Call back at Home Phone [phone]   Caller: Patient Call For: nadel Summary of Call: pt was recently seen by dr nadel on 03/28/10. says she told sn about her r hip pain. however in the past few days it is "giving her a fit". thinks she should have an xray asap.  Initial call taken by: Tivis Ringer, CNA,  April 10, 2010 9:41 AM  Follow-up for Phone Call        pt c/o right hip pain that has gotten worse over the last few days. She is requesting referral to ortho. Per SN ok to refer. Pt request Dr. Darrelyn Hillock. Order placed. Carron Curie CMA  April 10, 2010 10:23 AM

## 2011-01-15 NOTE — Miscellaneous (Signed)
Summary: Order/[facility]  Order/[facility]   Imported By: Lester [place] 04/03/2010 11:08:14  _____________________________________________________________________  External Attachment:    Type:   Image     Comment:   External Document

## 2011-01-15 NOTE — Miscellaneous (Signed)
Summary: PT Orders/[facility]  PT Orders/[facility]   Imported By: Lanelle Bal 03/29/2010 09:46:21  _____________________________________________________________________  External Attachment:    Type:   Image     Comment:   External Document

## 2011-01-15 NOTE — Assessment & Plan Note (Signed)
Summary: 3 month return/mhh   Primary Care Provider:  Alroy Dust, MD  CC:  3 month ROV & review of mult medical problems....  History of Present Illness: 77 y/o BF here for a follow up visit... he has multiple medical problems as noted below...  she is slow moving and deliberate in her actions, but she assures me that she and her husb are doing satis and safe at home... they do not want to consider alternative living arrangements...   ~  seen Oct09 c/o urinary symptoms- "gotta go"... she's drinking fluids all day long and c/o nocturia 1-4 times/night, no dysuria, no incontinence, hasn't seen GYN in yrs but saw DrNesi in 2008, given "pill" without relief...  we decided to alter her fluid intake pattern, and try Oxybutynin 5mg  Qhs but this didn't help... she saw DrTannenbaum et al & they tried phys therapy exercises and "a salve" w/ some transient improvement... she persists w/ these complaints but doesn't want to go back to Urology ("there's nothing they can do") or get second opinion in [place] etc...   ~  Jun10:  follow up visit w/ mult somatic complaints:  1) swelling in legs that tends to decr overnight, on Lasix 20mg /d & we discussed no salt & incr Lasix to 40mg /d;  2) LBP when getting up in the AM- asked to check her mattress, Rx ESTylenol;  3) nasal drainage x 36mo- we discussed treatment regimen w/ Zyrtek, Saline, Astepro Prn...   ~  July 03, 2009:  walk-in today (here w/ her daughter) w/ mult somatic complaints- weight loss, weakness, intermittent dizzy & ringing, persistant bladder problems, etc... she states appetite is fair & review of chart shows weights over the last yr betw 123-129#... today 117# w/ 1-2+edema in feet at present... we prev discussed supplements but she isn't taking them regularly... she is concerned and wants further eval... she has been weak & intermiitently dizzy- BP is trending low on her meds and we discussed decreasing Lisinopril20- 1/2 tab, Amlodipine5- 1/2 tab, Lasix20-  1tab daily...       ** Labs- all WNL, sed= 9.      ** CXR- clear, NAD, kyphoscoliosis...     ** CT Chest/ Abd/ Pelvis= NEG- 3 tiny 3mm right lung nodules (likely granulomas), tiny cysts in liver (otherw neg abd), sm ing hernias (otherw neg pelvis).   ~  August 23, 2009:  she presented to ER in Aug w/ concern for prominent brachial pulses ("I never saw those before, and I was concerned")- this got translated into palpitations & she was adm 8/14-15 for monitoring & r/o MI per cards...       ** EKG showed sinus brady, RBBB, NAD...  Labs normal & MI was R/O...      ** 2DEcho showed biatrial enlarg, norm LVF w/ mod LVH, sclerotic AV w/ mild AI & MR, norm RV. She has been fine since disch & I explained "hardening of the arteries" to her... she and husb are getting weaker & having more difficulty managing on their own- I have encouraged them to discuss w/ family re: alternative living arrangements.   ~  September 20, 2009:  add-on for persistant mult somatic complaints- discussed w/ pt & daughter... notes runny nose (Astepro helps), burning in feet c/w neuropathy but doesn't want new meds (offered Neurontin), want phys therapy at home for balance issues, & wants f/u DrNesi for bladder issues...   ~  December 27, 2009:  she's had a good 70mo- still "nervous &  weak" but balance improved w/ home phys therapy & she does exercises MWF w/ the TV!!!  she had f/u DrNesi for bladder issues- no new meds, states it's not too bad & doesn't want more meds...    Current Problem List:  Hx of ALLERGIES (ICD-995.3) - she uses OTC meds as needed, & we prev discussed Zyrtek 10mg Qam, Saline nasal mist, & Astepro 1-2 sp Bid for drainage (one of her chronic complaints)...  Hx of ASTHMATIC BRONCHITIS, ACUTE (ICD-466.0) - no recent exaserbations...  HYPERTENSION (ICD-401.9) - controlled on LISINOPRIL 20mg - 1 daily, NORVASC 5mg - 1daily, & FUROSEMIDE 20mg - 1/2 to 1 tab each AM... BP= 122/64 and she states that she is taking her  meds regularly & tolerating them well... denies HA, visual changes, CP, palpit, change in dyspnea, etc... but notes weakness, intermittently dizzy & persist edema in her feet...   VENOUS INSUFFICIENCY, CHRONIC (ICD-459.81) - there is mild pedal edema bilat... on low sodium diet, elevation, support hose- prev refused diuretic meds due to urinary symptoms, now on LASIX 20mg - 1/2 to 1Qam...  HYPERCHOLESTEROLEMIA (ICD-272.0) - prev on Lipitor 10mg /d, but she stopped this on her own... she has a very high HDL!!!  ~  FLP was 1/08 showed TChol 253, TG 125, HDL 121, LDL 93...  ~  FLP 2/09 showed TChol 200, TG 99, HDL 136, LDL 44...  GERD (ICD-530.81) - last EGD was 9/05 by DrPerry showing GERD and esoph stricture- dilated... she stopped her Nexium but states that her swallowing is good & she uses PRILOSEC as needed.  DIVERTICULOSIS OF COLON (ICD-562.10) - last colonoscopy was 11/00 showing divertics only...  HX, URINARY INFECTION (ICD-V13.02) & INCONTINENCE symptoms... she has seen DrTannenbaum & staff on bladder exercises and she reported some improvement but has persistant symptoms & never followed up...  ~  7/10:  offered f/u appt w/ Urology vs second opinion consult but she declines...  ~  9/10:  wrote for trial Enablex 7.5mg /d>>> no benefit she says.  ~  12/10: saw DrNesi- tried sample med, sl improved, but symptoms not bad enough for more meds, she says.  DEGENERATIVE JOINT DISEASE (ICD-715.90) - notes right shoulder symptoms, but managing OK w/ Tylenol...  OSTEOPOROSIS (ICD-733.00) - she was on Fosamax but had discontinued this on her own... we discussed the need for continued therapy and she restarted generic FOSAMAX, along w/ ca++, MVI, VitD...  SENILE DEMENTIA (ICD-290.0) - she takes ASA 81mg /d... she remains on ARICEPT 10mg /d and appears stable...  ANXIETY (ICD-300.00) - she uses Alpraz 0.25mg  as needed.  Hx of SHINGLES (ICD-053.9)    Allergies (verified): No Known Drug  Allergies  Comments:  Nurse/Medical Assistant: The patient's medications and allergies were reviewed with the patient and were updated in the Medication and Allergy Lists.  Past History:  Past Medical History:  Hx of ALLERGIES (ICD-995.3) Hx of ASTHMATIC BRONCHITIS, ACUTE (ICD-466.0) HYPERTENSION (ICD-401.9) PALPITATIONS (ICD-785.1) VENOUS INSUFFICIENCY, CHRONIC (ICD-459.81) HYPERCHOLESTEROLEMIA (ICD-272.0) GERD (ICD-530.81) DIVERTICULOSIS OF COLON (ICD-562.10) HX, URINARY INFECTION (ICD-V13.02) FREQUENCY, URINARY (ICD-788.41) URINARY INCONTINENCE (ICD-788.30) DEGENERATIVE JOINT DISEASE (ICD-715.90) OSTEOPOROSIS (ICD-733.00) SENILE DEMENTIA (ICD-290.0) ANXIETY (ICD-300.00) Hx of SHINGLES (ICD-053.9)  Past Surgical History: Hysterectomy Tonsillectomy  Family History: Reviewed history from 08/26/2009 and no changes required.  Both of her parents are deceased and her father had a  stroke, but neither one had heart disease.  She has one brother who died  at age 14 of pneumonia.      Social History: Reviewed history from 08/26/2009 and no changes required.  She lives  in [place] with her husband.  They have  been married almost 60 years.  She is a retired Runner, broadcasting/film/video.  She has no  history of alcohol, tobacco, or drug abuse.   Review of Systems      See HPI       The patient complains of decreased hearing, dyspnea on exertion, peripheral edema, incontinence, muscle weakness, and difficulty walking.  The patient denies anorexia, fever, weight loss, weight gain, vision loss, hoarseness, chest pain, syncope, prolonged cough, headaches, hemoptysis, abdominal pain, melena, hematochezia, severe indigestion/heartburn, hematuria, suspicious skin lesions, transient blindness, depression, unusual weight change, abnormal bleeding, enlarged lymph nodes, and angioedema.    Vital Signs:  Patient profile:   77 year old female Height:      60 inches Weight:      118.38 pounds O2 Sat:       100 % on Room air Temp:     98.0 degrees F oral Pulse rate:   51 / minute BP sitting:   122 / 64  (left arm) Cuff size:   regular  Vitals Entered By: Randell Loop CMA (December 27, 2009 11:35 AM)  O2 Sat at Rest %:  100 O2 Flow:  Room air CC: 3 month ROV & review of mult medical problems... Is Patient Diabetic? No Pain Assessment Patient in pain? no      Comments meds updated today   Physical Exam  Additional Exam:  WD, WN, 77 y/o BF in NAD... GENERAL:  Alert, pleasant & cooperative; she is slow moving as noted... HEENT:  [place]/AT,  EACs- some wax, TMs-wnl, NOSE-clear, THROAT-clear & wnl. NECK:  Supple w/ fairROM; no JVD; normal carotid impulses w/o bruits; no thyromegaly or nodules palpated; no lymphadenopathy. CHEST:  Clear to P & A; without wheezes/ rales/ or rhonchi., noted kyphosis .  HEART:  Regular Rhythm; without murmurs/ rubs/ or gallops. ABDOMEN:  Soft & nontender; normal bowel sounds; no organomegaly or masses detected. EXT: without deformities, mod arthritic changes; no varicose veins/ +venous insuffic/ 1+edema... NEURO:   Alert, answers questions appropriately, no focal neuro deficits... DERM:  No lesions noted; no rash etc...  REVIEW OF WEIGHTS: Jun09 to Jun10 - weights varied betw 123-129# Jun10 weight= 123# w/ 1+ edema reported... Jul10 weight= 117# w/ 1+ edema present... Sep10 weight= 122# w/ mod pedal edema... Oct10 weight= 123# w/ persist edema in feet... Jan11 weight = 119# w/ min edema...     Impression & Recommendations:  Problem # 1:  Hx of ASTHMATIC BRONCHITIS, ACUTE (ICD-466.0) No exac-  doing satis, same Rx...  Problem # 2:  HYPERTENSION (ICD-401.9) Controlled-  same meds. Her updated medication list for this problem includes:    Norvasc 5 Mg Tabs (Amlodipine besylate) .Marland Kitchen... Take 1 tab by mouth once daily    Lisinopril 20 Mg Tabs (Lisinopril) .Marland Kitchen... Take 1 tablet by mouth once a day    Furosemide 20 Mg Tabs (Furosemide) .Marland Kitchen... Take 1 tablet by  mouth once a day  Problem # 3:  VENOUS INSUFFICIENCY, CHRONIC (ICD-459.81) Continue no salt , elevation, support hose, and the Lasix...  Problem # 4:  HYPERCHOLESTEROLEMIA (ICD-272.0) On diet alone at age 89...  Problem # 5:  GERD (ICD-530.81) GI is stable-  using OTC Prilosec Prn...  Problem # 6:  URINARY INCONTINENCE (ICD-788.30) Followed by Thermon Leyland, as noted...  Problem # 7:  OSTEOPOROSIS (ICD-733.00) She stopped the Fosamax, again-  rec continue calcium, vits, vit D... The following medications were removed from the medication list:  Fosamax 70 Mg Tabs (Alendronate sodium) .Marland Kitchen... 1 by mouth every week  Problem # 8:  DEGENERATIVE JOINT DISEASE (ICD-715.90) Aware-  age 3, rec to be very careful, they refuse AL etc... Her updated medication list for this problem includes:    Adult Aspirin Low Strength 81 Mg Tbdp (Aspirin) .Marland Kitchen... 1 by mouth once daily  Problem # 9:  SENILE DEMENTIA (ICD-290.0) She remains on ARICEPT...  Problem # 10:  OTHER MEDICAL PROBLEMS AS NOTED>>>  Complete Medication List: 1)  [facility]vs Saline Nasal Spray 0.65 % Soln (Saline) .... As needed 2)  Adult Aspirin Low Strength 81 Mg Tbdp (Aspirin) .Marland Kitchen.. 1 by mouth once daily 3)  Norvasc 5 Mg Tabs (Amlodipine besylate) .... Take 1 tab by mouth once daily 4)  Lisinopril 20 Mg Tabs (Lisinopril) .... Take 1 tablet by mouth once a day 5)  Furosemide 20 Mg Tabs (Furosemide) .... Take 1 tablet by mouth once a day 6)  Caltrate 600+d 600-400 Mg-unit Tabs (Calcium carbonate-vitamin d) .Marland Kitchen.. 1 tab twice daily.Marland KitchenMarland Kitchen 7)  Multivitamins Tabs (Multiple vitamin) .... Take 1 tablet by mouth once a day 8)  Vitamin D 1000 Unit Caps (Cholecalciferol) .... Take 1 cap by mouth once daily.Marland KitchenMarland Kitchen 9)  Aricept 10 Mg Tabs (Donepezil hcl) .Marland Kitchen.. 1 by mouth once daily 10)  Alprazolam 0.25 Mg Tabs (Alprazolam) .... 1/2 to 1 tab by mouth three times a day as needed for nerves...  Other Orders: Prescription Created Electronically [phone])  Patient  Instructions: 1)  Today we updated your med list- see below.... 2)  It's OK to space out your medications any way that you want to... 3)  We refilled your Alprazolam nerve pill today... 4)  Let's plan a follow up visit in 3-4 months w/ FASTING blood work at that time- please make an early appt that day. Prescriptions: ALPRAZOLAM 0.25 MG  TABS (ALPRAZOLAM) 1/2 to 1 tab by mouth three times a day as needed for nerves...  #100 x prn   Entered and Authorized by:   [name] MD   Signed by:   [name] MD on 12/27/2009   Method used:   Print then Give to Patient   RxID:   [id number]

## 2011-01-15 NOTE — Miscellaneous (Signed)
Summary: PT Order/[facility]  PT Order/[facility]   Imported By: Sherian Rein 03/01/2010 14:47:17  _____________________________________________________________________  External Attachment:    Type:   Image     Comment:   External Document

## 2011-01-15 NOTE — Miscellaneous (Signed)
Summary: Physician Verbal Order/[facility]  Physician Verbal Order/[facility]   Imported By: Sherian Rein 03/08/2010 13:54:17  _____________________________________________________________________  External Attachment:    Type:   Image     Comment:   External Document

## 2011-01-15 NOTE — Miscellaneous (Signed)
Summary: Order/[facility]  Order/[facility]   Imported By: Lester [place] 06/14/2010 07:52:36  _____________________________________________________________________  External Attachment:    Type:   Image     Comment:   External Document

## 2011-01-15 NOTE — Miscellaneous (Signed)
Summary: Plan of Care & Treatment/[facility]  Plan of Care & Treatment/[facility]   Imported By: Sherian Rein 03/08/2010 13:52:48  _____________________________________________________________________  External Attachment:    Type:   Image     Comment:   External Document

## 2011-01-15 NOTE — Progress Notes (Signed)
Summary: note  Phone Note Call from Patient Call back at Home Phone [phone]   Caller: Patient Call For: nadel Reason for Call: Talk to Nurse Summary of Call: need a note explaining to her lawyer that she is approvable for legal documents.   Initial call taken by: Eugene Gavia,  June 04, 2010 1:59 PM  Follow-up for Phone Call        pt requesting a letter stating that she able to make her own decisions about legal issues and legal documents--pt needs to p/u at 1pm tomorrow 6/21  because she has a meeting with the attorney at 2pm and needs to submit this letter at the meeting Follow-up by: Philipp Deputy CMA,  June 04, 2010 3:29 PM  Additional Follow-up for Phone Call Additional follow up Details #1::        called and spoke with pt and she is unable to come in the am for this appt but can make it for 6-29 at 9:30. Randell Loop CMA  June 04, 2010 5:43 PM

## 2011-01-15 NOTE — Progress Notes (Signed)
Summary: [name] appt  Phone Note Call from Patient Call back at [facility] Phone [phone]   Caller: Patient Call For: Orbie Grupe Summary of Call: pt called again re: [facility] appt. says aug is too far out for this appt. pt understands (i believe) that no one will call her until monday re: this.  Initial call taken by: Tivis Ringer, CNA,  June 08, 2010 5:33 PM  Follow-up for Phone Call        called and spoke with pt and she didnt even state anything about the [facility] appt---she only wanted to know if SN would be able to admit her to the hospital over the weekend.  i explained all of this to her that SN does not admit that they would call the hospitalist for admit if needed. Randell Loop CMA  June 08, 2010 5:40 PM

## 2011-01-15 NOTE — Progress Notes (Signed)
Summary: leg and hip pain  Phone Note From Other Clinic   Caller: libby Call For: nadel Summary of Call: spoke to this pt, there is a misunderstanding about this dr Caryl Ada is a gi dr ,she wants to see some here in [place] if possible for ? neurophaty because of the feet and hip pain she thinks that might be what she has Initial call taken by: Oneita Jolly,  May 01, 2010 12:17 PM  Follow-up for Phone Call        The docotr the pt saw at [name] years ago was a GI doctor, sothe pt is now requesitng to be referred to someone here in [place] for her "neropathy." Please advise where to refer her to . thanks. Carron Curie CMA  May 01, 2010 1:14 PM  Per Dr. Kriste Basque refer to [facility]  Neurology for neuopathy eval Dr. Terrace Arabia. Referral form completed and faxed to [facility]. Will call pt when appt is made. Pt is aware of process. Alfonso Ramus  May 01, 2010 3:17 PM   Additional Follow-up for Phone Call Additional follow up Details #1::        order is in computer for pt Randell Loop CMA  May 01, 2010 4:46 PM   New Problems: NEUROPATHY (ICD-355.9)   New Problems: NEUROPATHY (ICD-355.9)

## 2011-01-15 NOTE — Miscellaneous (Signed)
Summary: d/c/[facility]  d/c/[facility]   Imported By: Lester [place] 08/01/2010 08:24:07  _____________________________________________________________________  External Attachment:    Type:   Image     Comment:   External Document

## 2011-01-15 NOTE — Miscellaneous (Signed)
Summary: Episode Summary Report/[facility]  Episode Summary Report/[facility]   Imported By: Sherian Rein 04/10/2010 10:48:40  _____________________________________________________________________  External Attachment:    Type:   Image     Comment:   External Document

## 2011-01-15 NOTE — Assessment & Plan Note (Signed)
Summary: 4 months/apc   Primary Care Provider:  Alroy Dust, MD  CC:  4 month ROV & review of mult medical problems....  History of Present Illness: 77 y/o BF here for a follow up visit... he has multiple medical problems as noted below...    She is 77 y/o w/ hx HBP, VI & chr edema, DJD & osteoporosis, and a mild senile dementia... she & her husb still live on their own but are having numerous difficulties while still resisting any thoughts of assisted living etc... she has a mild senile dementia> eg- once got concerned because she saw an impulse in her left antecubital fossa (tortuous brachial art), went to the ER & this got translated to palpitations & lead to full ER cardiac eval= neg... MMSE 6/11 = 26/30 & started on Aricept.   ~  December 27, 2009:  she's had a good 58mo- still "nervous & weak" but balance improved w/ home phys therapy & she does exercises MWF w/ the TV!  she had f/u DrNesi for bladder issues- no new meds, states it's not too bad & doesn't want more meds...   ~  March 28, 2010:  she persists w/ mult concerns & is hard to reassure... she states "I have it rough, my husb is an old man"... today she mentions: DM> BS are all WNL;  out of Lasix & feet swelling> no salt & refill Lasix20;  "I have neuropathy"> c/o warmness on bottom of feet & up into her legs, but not worth additional med Rx she says;  c/o knee arthritis & balance off> getting PT at home twice weekly;  "Are grits OK in my diet & how about [place] sausage"...   ~  June 13, 2010:  once again she has mult somatic complaints> legs swelling, nose running, wants advise regarding MVI, and letter to lawyer stating she's able to handle her finances... re: leg swelling> we reviewed chr VI/ edema & the need to elim sodium, elevate legs, wear support hose, & stay on the Lasix20- 2tabsQam (she wants to see a specialist at [facility]);  re: runny nose> try Atrovent Nasal spray;  I rec Women's MVI to her for daily use;  MMSE today = 26/30  (missing place, & 2/3 recall items after distraction) see letter she requested.   ~  October 11, 2010:  she is upset that she had to wait 624mo for f/u appt- wants them Q658mo... she turned 77 y/o yest... she has list of complaints:  feet burning> known neuropathy but not bad enough for new med yet;  weak back> we discussed back brace Prn;  ?do I have bloos clots/> no;  eyes burning, nose running, mult complaints about husb> all discussed...  breathing is stable;  no CP, palpit, ch in SOB;  BP controlled on meds;  otherw stable & she will ret 58mo fasting for labs.   Current Problem List:  Hx of ALLERGIES (ICD-995.3) - she uses OTC meds as needed, & we prev discussed Zyrtek 10mg Qam, Saline nasal mist, & ATROVENT Nasal 0.03% Tid for drainage (one of her chronic complaints)...  Hx of ASTHMATIC BRONCHITIS, ACUTE (ICD-466.0) - no recent exacerbations...  HYPERTENSION (ICD-401.9) - controlled on LISINOPRIL 20mg - 1 daily & FUROSEMIDE 20mg - 1-2 each AM... BP= 122/60 and she states that she is taking her meds regularly & tolerating them well... denies HA, visual changes, CP, palpit, change in dyspnea, etc... but notes weakness, intermittently dizzy & persist edema in her feet...   VENOUS INSUFFICIENCY, CHRONIC (  ICD-459.81) - there is mod pedal edema bilat L>R... on low sodium diet, elevation, support hose- prev refused diuretic meds due to urinary symptoms, now on LASIX 20mg - 1-2 Qam.  HYPERCHOLESTEROLEMIA (ICD-272.0) - prev on Lip10, but she stopped this on her own... she has a very high HDL!!!  ~  FLP was 1/08 showed TChol 253, TG 125, HDL 121, LDL 93  ~  FLP 2/09 showed TChol 200, TG 99, HDL 136, LDL 44  ~  FLP 4/11 showed TChol 244, TG 143, HDL 149, LDL 59  GERD (ICD-530.81) - last EGD was 9/05 by DrPerry showing GERD and esoph stricture- dilated... she stopped her Nexium but states that her swallowing is good & she uses PRILOSEC as needed.  DIVERTICULOSIS OF COLON (ICD-562.10) - last colonoscopy was  11/00 showing divertics only...  HX, URINARY INFECTION (ICD-V13.02) & INCONTINENCE symptoms... she has seen DrTannenbaum & staff on bladder exercises and she reported some improvement but has persistant symptoms & never followed up...  ~  7/10:  offered f/u appt w/ Urology vs second opinion consult but she declines...  ~  9/10:  wrote for trial Enablex 7.5mg /d >> no benefit she says.  ~  12/10: saw DrNesi- tried sample med, sl improved, but symptoms not bad enough for more meds, she says.  DEGENERATIVE JOINT DISEASE (ICD-715.90) - notes right shoulder symptoms, but managing OK w/ Tylenol...  she also c/o neuropathic "burning" in legs but not bad enough for additional meds she says...  OSTEOPOROSIS (ICD-733.00) - she was on Fosamax but had discontinued this on her own... we discussed the need for continued therapy and she was asked to restart Alendronate but she never did...  she takes Ca++, MVI, VitD...  SENILE DEMENTIA (ICD-290.0) - she takes ASA 81mg /d... she tried Aricept in the past but didn't notice any improvement therefore stopped it.  ~  16/11: MMSE showed 26/30 & she agreed to try DONEPEZIL 10mg /d...  ANXIETY (ICD-300.00) - she uses Alpraz 0.25mg  as needed.  Hx of SHINGLES (ICD-053.9)   Preventive Screening-Counseling & Management  Alcohol-Tobacco     Smoking Status: never  Allergies (verified): No Known Drug Allergies  Comments:  Nurse/Medical Assistant: The patient's medications and allergies were reviewed with the patient and were updated in the Medication and Allergy Lists.  Past History:  Past Medical History: Hx of ALLERGIES (ICD-995.3) Hx of ASTHMATIC BRONCHITIS, ACUTE (ICD-466.0) HYPERTENSION (ICD-401.9) PALPITATIONS (ICD-785.1) VENOUS INSUFFICIENCY, CHRONIC (ICD-459.81) HYPERCHOLESTEROLEMIA (ICD-272.0) GERD (ICD-530.81) DIVERTICULOSIS OF COLON (ICD-562.10) HX, URINARY INFECTION (ICD-V13.02) FREQUENCY, URINARY (ICD-788.41) URINARY INCONTINENCE  (ICD-788.30) DEGENERATIVE JOINT DISEASE (ICD-715.90) OSTEOPOROSIS (ICD-733.00) SENILE DEMENTIA (ICD-290.0) NEUROPATHY (ICD-355.9) ANXIETY (ICD-300.00) Hx of SHINGLES (ICD-053.9)  Past Surgical History: Hysterectomy Tonsillectomy  Family History: Reviewed history from 06/13/2010 and no changes required. Both of her parents are deceased and her father had a  stroke, but neither one had heart disease.  She has one brother who died  at age 71 of pneumonia  Social History: Reviewed history from 06/13/2010 and no changes required. She lives in [place] with her husband.  They have  been married almost 60 years.  She has no  history of alcohol, tobacco, or drug abuse.  She is a retired Tourist information centre manager, Manufacturing engineer, retired Barrister's clerk  Review of Systems      See HPI       The patient complains of decreased hearing, dyspnea on exertion, peripheral edema, muscle weakness, and difficulty walking.  The patient denies anorexia, fever, weight loss, weight gain, vision loss, hoarseness, chest  pain, syncope, prolonged cough, headaches, hemoptysis, abdominal pain, melena, hematochezia, severe indigestion/heartburn, hematuria, incontinence, suspicious skin lesions, transient blindness, depression, unusual weight change, abnormal bleeding, enlarged lymph nodes, and angioedema.    Vital Signs:  Patient profile:   77 year old female Height:      60 inches Weight:      124.50 pounds BMI:     24.40 O2 Sat:      97 % on Room air Temp:     98.0 degrees F oral Pulse rate:   54 / minute BP sitting:   122 / 60  (left arm) Cuff size:   regular  Vitals Entered By: Randell Loop CMA (October 11, 2010 12:38 PM)  O2 Sat at Rest %:  97 O2 Flow:  Room air CC: 4 month ROV & review of mult medical problems... Is Patient Diabetic? No Pain Assessment Patient in pain? no      Comments meds updated today with pt   Physical Exam  Additional Exam:  WD, WN, 77 y/o BF in NAD... GENERAL:  Alert,  pleasant & cooperative; she is slow moving as noted... HEENT:  [place]/AT, EOM-full, EACs- some wax, TMs-wnl, NOSE-clear, THROAT-clear & wnl. NECK:  Supple w/ fairROM; no JVD; normal carotid impulses w/o bruits; no thyromegaly or nodules palpated; no lymphadenopathy. CHEST:  Clear to P & A; without wheezes/ rales/ or rhonchi., noted kyphosis... HEART:  Regular Rhythm; without murmurs/ rubs/ or gallops heard... ABDOMEN:  Soft & nontender; normal bowel sounds; no organomegaly or masses detected. EXT: without deformities, mod arthritic changes; no varicose veins/ +venous insuffic/ 1-2+edema... NEURO:   Alert, answers questions appropriately, no focal neuro deficits... DERM:  No lesions noted; no rash etc... MMSE> 06/13/10> Score 26/30 (missed place, 2/3 recall after distraction, & copy figure)...  REVIEW OF WEIGHTS: Jun09 to Jun10 - weights varied betw 123-129# Jun10 weight= 123# w/ 1+ edema reported. Jul10 weight= 117# w/ 1+ edema present. Sep10 weight= 122# w/ mod pedal edema. Oct10 weight= 123# w/ persist edema in feet. Jan11 weight = 119# w/ min edema. Mar11 weight = 123# w/ some edema. Jun11 weight = 123# w/ persist edema. Oct11 weight = 125# w/ no ch edema.    Impression & Recommendations:  Problem # 1:  WEAKNESS (ICD-780.79) Clinically no change- noe 77 y/o... rec to stay active & continue home therapy.  Problem # 2:  HYPERTENSION (ICD-401.9) Controlled>  same meds... supervised by daughter. Her updated medication list for this problem includes:    Lisinopril 20 Mg Tabs (Lisinopril) .Marland Kitchen... Take 1 tablet by mouth once a day    Furosemide 20 Mg Tabs (Furosemide) .Marland Kitchen... Take 2 tabs by mouth each morning for swelling...  Problem # 3:  VENOUS INSUFFICIENCY, CHRONIC (ICD-459.81) Reviewed no salt, elevation, support hose, & Lasix Rx...  Problem # 4:  HYPERCHOLESTEROLEMIA (ICD-272.0) She has one of the highest HDL's I've ever seen....  Problem # 5:  GERD (ICD-530.81) GI stable...  continue same.  Problem # 6:  FREQUENCY, URINARY (ICD-788.41) GU stable>  she states that she's "dry">  no additional meds.  Problem # 7:  DEGENERATIVE JOINT DISEASE (ICD-715.90) Aware>  she gets about w/ cane... uses Tylenol for pain... Her updated medication list for this problem includes:    Adult Aspirin Low Strength 81 Mg Tbdp (Aspirin) .Marland Kitchen... 1 by mouth once daily  Problem # 8:  SENILE DEMENTIA (ICD-290.0) She seems about the same on the generic Aricept...  Complete Medication List: 1)  [facility] Saline Nasal Spray 0.65 %  Soln (Saline) .... As needed 2)  Adult Aspirin Low Strength 81 Mg Tbdp (Aspirin) .Marland Kitchen.. 1 by mouth once daily 3)  Lisinopril 20 Mg Tabs (Lisinopril) .... Take 1 tablet by mouth once a day 4)  Furosemide 20 Mg Tabs (Furosemide) .... Take 2 tabs by mouth each morning for swelling... 5)  Caltrate 600+d 600-400 Mg-unit Tabs (Calcium carbonate-vitamin d) .Marland Kitchen.. 1 tab twice daily.Marland KitchenMarland Kitchen 6)  Multivitamins Tabs (Multiple vitamin) .... Take 1 tablet by mouth once a day 7)  Vitamin D 1000 Unit Caps (Cholecalciferol) .... Take 1 cap by mouth once daily.Marland KitchenMarland Kitchen 8)  Alprazolam 0.25 Mg Tabs (Alprazolam) .... 1/2 to 1 tab by mouth three times a day as needed for nerves.Marland KitchenMarland Kitchen 9)  Donepezil Hcl 10 Mg Tabs (Donepezil hcl) .... Take 1 tablet by mouth once a day  Patient Instructions: 1)  Today we updated your med list- see below.... 2)  Continue your current medications the same... 3)  Let's plan a follow up visit in 3 months w/ FASTING blood work at that time.Marland KitchenMarland Kitchen

## 2011-01-15 NOTE — Miscellaneous (Signed)
Summary: Order/[facility]  Order/[facility]   Imported By: Lester South [place] 08/01/2010 08:25:15  _____________________________________________________________________  External Attachment:    Type:   Image     Comment:   External Document

## 2011-01-15 NOTE — Miscellaneous (Signed)
Summary: OT order/[facility]  OT order/[facility]   Imported By: Sherian Rein 03/08/2010 13:51:31  _____________________________________________________________________  External Attachment:    Type:   Image     Comment:   External Document

## 2011-01-15 NOTE — Progress Notes (Signed)
Summary: FOOT SWELLING  Phone Note Call from Patient Call back at Home Phone [phone]   Caller: Patient Call For: NADEL Summary of Call: PATIENTS FOOT IS SWELLING AND SHE IS CONCERENED  Initial call taken by: Lacinda Axon,  May 30, 2010 12:41 PM  Follow-up for Phone Call        Spoke with pt.  She was last seen by TP on 05/24/10 for left leg edema.  She states that swelling is the same- not better and no worse.  She states that she does not eat salt and wears her support stockings daily. She keeps leg elevated.  She states that she is only taking 1 20 mg lasix daily.  I advised that per TP's last instructions, she can take 2 daily if needed.  I advised that she try taking another tablet and see if this helps and also to continue to keep legs elevated, no salt, and stockings daily.  Pt verbalized understanding.  Any other recs? Pls advise if so, thanks! Follow-up by: Vernie Murders,  May 30, 2010 2:56 PM  Additional Follow-up for Phone Call Additional follow up Details #1::        Per SN- pt should follow the above recs and just take 2 lasix every am x 7 days, and then go back to taking 1 every am.  Spoke with pt and she verbalized understanding of this. Additional Follow-up by: Vernie Murders,  May 30, 2010 3:48 PM

## 2011-01-15 NOTE — Progress Notes (Signed)
Summary: pain and legs and feet-  Phone Note Call from Patient   Caller: Patient Call For: Jessica [name] Summary of Call: pt have pain in  feet and legs would like to see Dr Kriste Basque this week Initial call taken by: Rickard Patience,  Apr 30, 2010 9:18 AM  Follow-up for Phone Call        Pt c/o pain and swelling in both feet. SHe states she ahs always had this but it is getting worse instead of better. She states she cannot get her shoes on durign the day. Pt has seen Dr. Darrelyn Hillock for her hip pain. Pt is requesting a referral to [name]. she states seh went to [name] in the past and wanted to know if SN thought she should go back since she is having this problem. Please advise. Carron Curie CMA  Apr 30, 2010 9:27 AM   Additional Follow-up for Phone Call Additional follow up Details #1::        per SN---he thinks that this would be the conservative approach than rx is much better in anyone over the age of 90---ov with SN if she insists--thanks Randell Loop CMA  Apr 30, 2010 2:24 PM   pt advsied and wants referral to [name] order palced. Carron Curie CMA  May 01, 2010 9:50 AM     Additional Follow-up for Phone Call Additional follow up Details #2::    LMOMTCB Vernie Murders  Apr 30, 2010 2:37 PM

## 2011-01-15 NOTE — Miscellaneous (Signed)
Summary: Orders Update  Clinical Lists Changes  Orders: Added new Test order of Venous Duplex Lower Extremity (Venous Duplex Lower) - Signed 

## 2011-01-15 NOTE — Progress Notes (Signed)
Summary: refill  Phone Note Call from Patient   Caller: Patient Call For: Starlin Steib Summary of Call: need refill for amlodipine bsylate 5mg  [facility] [address] Initial call taken by: Rickard Patience,  October 03, 2010 2:31 PM  Follow-up for Phone Call        lmomtcb for pt---looks like she has not been taking the norvasc since june.  pt is only taking lisinopril and lasix---this is what SN told her to take her  BP Randell Loop [facility]  October 03, 2010 3:22 PM   Additional Follow-up for Phone Call Additional follow up Details #1::        called and spoke with pt and explained to pt that SN had stopped the norvasc back in june---that is why the refill requests had been denied---pt reveiwed her meds with me and she is aware that she is not taking the norvasc now. pt stated that the furosemide has helped the swelling.   Randell Loop CMA  October 04, 2010 4:28 PM

## 2011-01-15 NOTE — Progress Notes (Signed)
Summary: refill  Phone Note Call from Patient Call back at Home Phone [phone]   Caller: Patient Call For: Terrin Meddaugh Reason for Call: Refill Medication Summary of Call: Refill for furosemide 20mg .[address] Initial call taken by: Darletta Moll,  July 05, 2010 4:36 PM  Follow-up for Phone Call        rx refill for furosemide has been sent to the pharmacy for pt.  pt is aware. Randell Loop [facility]  July 05, 2010 4:38 PM     Prescriptions: FUROSEMIDE 20 MG  TABS (FUROSEMIDE) Take 2 tabs by mouth each morning for swelling...  #60 x 2   Entered by:   Randell Loop CMA   Authorized by:   [name] MD   Signed by:   Randell Loop CMA on 07/05/2010   Method used:   [facility] to        [facility] [phone]* (retail)       [address]       [place], [place]  [id number]       Ph: [phone] or [phone]       Fax: [phone]   RxID:   [id number]

## 2011-01-15 NOTE — Progress Notes (Signed)
Summary: treatment  Phone Note Call from Patient Call back at Home Phone [phone]   Caller: Patient Call For: Melea Prezioso Reason for Call: Talk to Nurse Summary of Call: re: vemous insuficiancy that SN is treating her for. Initial call taken by: Eugene Gavia,  June 08, 2010 11:39 AM  Follow-up for Phone Call        Spoke with pt.  She states that she followed SN's recs to double her lasix x 7 days. She is back to taking 1 qam.  States that she never noticed much improvement at all. She is requesting referral to [name] to eval her venous insufficiency.  Please advise thanks! Follow-up by: Vernie Murders,  June 08, 2010 11:47 AM  Additional Follow-up for Phone Call Additional follow up Details #1::        SN has picked out a MD at [facility] and sent this to the [facility] for appt date and time.  will contact pt once this has been done. Randell Loop CMA  June 08, 2010 2:53 PM

## 2011-01-15 NOTE — Assessment & Plan Note (Signed)
Summary: discuss letter needed/la   Primary Care Provider:  Alroy Dust, MD  CC:  2 month ROV w/ mult somatic complaints....  History of Present Illness: 77 y/o BF here for a follow up visit... he has multiple medical problems as noted below...    She is 77 y/o w/ hx HBP, VI & chr edema, DJD & osteoporosis, and a mild senile dementia... she & her husb still live on their own but are having numerous difficulties while still resisting any thoughts of assisted /living etc... she has a mild senile dementia (eg- once got concerned because she saw an impulse in her left antecubital fossa (tortuous brachial art), went to the ER & this got translated to palpitations & lead to full ER cardiac eval= neg).   ~  December 27, 2009:  she's had a good 74mo- still "nervous & weak" but balance improved w/ home phys therapy & she does exercises MWF w/ the TV!!!  she had f/u DrNesi for bladder issues- no new meds, states it's not too bad & doesn't want more meds...   ~  March 28, 2010:  she persists w/ mult concerns & is hard to reassure... she states "I have it rough, my husb is an old man"... today she mentions: DM> BS are all WNL;  out of Lasix & feet swelling> no salt & refill Lasix20;  "I have neuropathy> c/o warmness on bottom of feet & up into her legs, but not worth additional med Rx she says;  c/o knee arthritis & balance off> getting PT at home twice weekly;  "Are grits OK in my diet & how about [place] sausage"...   ~  June 13, 2010:  once again she has mult somatic complaints> legs swelling, nose running, wants advise regarding MVI, and letter to lawyer stating she's able to handle her finances... re: leg swelling- we reviewed chr VI/ edema & the need to elim sodium, elevate legs, wear support hose, & stay on the Lasix20- 2tabsQam (she wants to see a specialist at [facility]);  re: runny nose- try Atrovent Nasal spray;  I rec Women[facility] MVI to her for daily use;  MMSE today = 26/30 (missing place, & 2/3 recall items  after distraction) see letter she requested.   Current Problem List:  Hx of ALLERGIES (ICD-995.3) - she uses OTC meds as needed, & we prev discussed Zyrtek 10mg Qam, Saline nasal mist, & ATROVENT Nasal 0.03% Tid for drainage (one of her chronic complaints)...  Hx of ASTHMATIC BRONCHITIS, ACUTE (ICD-466.0) - no recent exacerbations...  HYPERTENSION (ICD-401.9) - controlled on LISINOPRIL 20mg - 1 daily, NORVASC 5mg - 1daily, & FUROSEMIDE 20mg - 1-2 each AM... BP= 126/64 and she states that she is taking her meds regularly & tolerating them well... denies HA, visual changes, CP, palpit, change in dyspnea, etc... but notes weakness, intermittently dizzy & persist edema in her feet...   VENOUS INSUFFICIENCY, CHRONIC (ICD-459.81) - there is mod pedal edema bilat L>R... on low sodium diet, elevation, support hose- prev refused diuretic meds due to urinary symptoms, now on LASIX 20mg - 1-2 Qam.  HYPERCHOLESTEROLEMIA (ICD-272.0) - prev on Lipitor 10mg /d, but she stopped this on her own... she has a very high HDL!!!  ~  FLP was 1/08 showed TChol 253, TG 125, HDL 121, LDL 93  ~  FLP 2/09 showed TChol 200, TG 99, HDL 136, LDL 44  ~  FLP 4/11 showed TChol 244, TG 143, HDL 149, LDL 59  GERD (ICD-530.81) - last EGD was 9/05 by DrPerry showing  GERD and esoph stricture- dilated... she stopped her Nexium but states that her swallowing is good & she uses PRILOSEC as needed.  DIVERTICULOSIS OF COLON (ICD-562.10) - last colonoscopy was 11/00 showing divertics only...  HX, URINARY INFECTION (ICD-V13.02) & INCONTINENCE symptoms... she has seen DrTannenbaum & staff on bladder exercises and she reported some improvement but has persistant symptoms & never followed up...  ~  7/10:  offered f/u appt w/ Urology vs second opinion consult but she declines...  ~  9/10:  wrote for trial Enablex 7.5mg /d >> no benefit she says.  ~  12/10: saw DrNesi- tried sample med, sl improved, but symptoms not bad enough for more meds, she  says.  DEGENERATIVE JOINT DISEASE (ICD-715.90) - notes right shoulder symptoms, but managing OK w/ Tylenol...  OSTEOPOROSIS (ICD-733.00) - she was on Fosamax but had discontinued this on her own... we discussed the need for continued therapy and she was asked to restart Alendronate but she never did...  she takes ca++, MVI, VitD...  SENILE DEMENTIA (ICD-290.0) - she takes ASA 81mg /d... she tried Aricept in the past but didn't notice any improvement therefore stopped it.  ANXIETY (ICD-300.00) - she uses Alpraz 0.25mg  as needed.  Hx of SHINGLES (ICD-053.9)   Preventive Screening-Counseling & Management  Alcohol-Tobacco     Smoking Status: never  Allergies: No Known Drug Allergies  Comments:  Nurse/Medical Assistant: The patient's medications and allergies were reviewed with the patient and were updated in the Medication and Allergy Lists.  Past History:  Past Medical History: Hx of ALLERGIES (ICD-995.3) Hx of ASTHMATIC BRONCHITIS, ACUTE (ICD-466.0) HYPERTENSION (ICD-401.9) PALPITATIONS (ICD-785.1) VENOUS INSUFFICIENCY, CHRONIC (ICD-459.81) HYPERCHOLESTEROLEMIA (ICD-272.0) GERD (ICD-530.81) DIVERTICULOSIS OF COLON (ICD-562.10) HX, URINARY INFECTION (ICD-V13.02) FREQUENCY, URINARY (ICD-788.41) URINARY INCONTINENCE (ICD-788.30) DEGENERATIVE JOINT DISEASE (ICD-715.90) OSTEOPOROSIS (ICD-733.00) SENILE DEMENTIA (ICD-290.0) NEUROPATHY (ICD-355.9) ANXIETY (ICD-300.00) Hx of SHINGLES (ICD-053.9)  Past Surgical History: Hysterectomy Tonsillectomy  Family History: Reviewed history from 08/26/2009 and no changes required. Both of her parents are deceased and her father had a  stroke, but neither one had heart disease.  She has one brother who died  at age 95 of pneumonia.      Social History: Reviewed history from 08/26/2009 and no changes required. She lives in [place] with her husband.  They have  been married almost 60 years.  She has no  history of alcohol, tobacco,  or drug abuse.  She is a retired Tourist information centre manager, Manufacturing engineer, retired Barrister's clerk...  Review of Systems      See HPI       The patient complains of decreased hearing, dyspnea on exertion, peripheral edema, incontinence, and difficulty walking.  The patient denies anorexia, fever, weight loss, weight gain, vision loss, hoarseness, chest pain, syncope, prolonged cough, headaches, hemoptysis, abdominal pain, melena, hematochezia, severe indigestion/heartburn, hematuria, muscle weakness, suspicious skin lesions, transient blindness, depression, unusual weight change, abnormal bleeding, enlarged lymph nodes, and angioedema.    Vital Signs:  Patient profile:   77 year old female Height:      60 inches Weight:      123 pounds O2 Sat:      98 % on Room air Temp:     98.7 degrees F oral Pulse rate:   56 / minute BP sitting:   126 / 64  (left arm) Cuff size:   regular  Vitals Entered By: Randell Loop CMA (June 13, 2010 10:10 AM)  O2 Sat at Rest %:  98 O2 Flow:  Room air CC: 2 month ROV  w/ mult somatic complaints... Is Patient Diabetic? No Pain Assessment Patient in pain? no      Comments no changes in meds today   Physical Exam  Additional Exam:  WD, WN, 77 y/o BF in NAD... GENERAL:  Alert, pleasant & cooperative; she is slow moving as noted... HEENT:  [place]/AT, EOM-full, EACs- some wax, TMs-wnl, NOSE-clear, THROAT-clear & wnl. NECK:  Supple w/ fairROM; no JVD; normal carotid impulses w/o bruits; no thyromegaly or nodules palpated; no lymphadenopathy. CHEST:  Clear to P & A; without wheezes/ rales/ or rhonchi., noted kyphosis... HEART:  Regular Rhythm; without murmurs/ rubs/ or gallops heard... ABDOMEN:  Soft & nontender; normal bowel sounds; no organomegaly or masses detected. EXT: without deformities, mod arthritic changes; no varicose veins/ +venous insuffic/ 1-2+edema... NEURO:   Alert, answers questions appropriately, no focal neuro deficits... DERM:  No lesions noted; no  rash etc... MMSE> 06/13/10> Score 26/30 (missed place, 2/3 recall after distraction, & copy figure)...  REVIEW OF WEIGHTS: Jun09 to Jun10 - weights varied betw 123-129# Jun10 weight= 123# w/ 1+ edema reported... Jul10 weight= 117# w/ 1+ edema present... Sep10 weight= 122# w/ mod pedal edema... Oct10 weight= 123# w/ persist edema in feet... Jan11 weight = 119# w/ min edema... Mar11 weight = 123# w/ some edema... Jun11 weight = 123# w/ persist edema...    Impression & Recommendations:  Problem # 1:  VENOUS INSUFFICIENCY, CHRONIC (ICD-459.81) This is her CC> VI w/ edema... we reviewed the causes & the treatment> no salt, elevation, support hose & continue Lasix 40mg /d... she asked [place] to set up appt at [facility] w vasc specialist & I tried to convince her that this trip was unnecessary & could be too taxing for her...  Problem # 2:  HYPERTENSION (ICD-401.9) BP controlled>  continue same meds... Her updated medication list for this problem includes:    Lisinopril 20 Mg Tabs (Lisinopril) .Marland Kitchen... Take 1 tablet by mouth once a day    Furosemide 20 Mg Tabs (Furosemide) .Marland Kitchen... Take 2 tabs by mouth each morning for swelling...  Problem # 3:  HYPERCHOLESTEROLEMIA (ICD-272.0) She has the highest HDL I have ever seen... she remain on diet Rx alone, numbers reviewed..  Problem # 4:  GERD (ICD-530.81) GI is stable...  Problem # 5:  URINARY INCONTINENCE (ICD-788.30) She sees DrNesi on occas... stable.  Problem # 6:  DEGENERATIVE JOINT DISEASE (ICD-715.90) She uses OTC anti-inflamm meds Prn... Her updated medication list for this problem includes:    Adult Aspirin Low Strength 81 Mg Tbdp (Aspirin) .Marland Kitchen... 1 by mouth once daily  Problem # 7:  SENILE DEMENTIA (ICD-290.0) Her MMSE was actually pretty good= 26/30...  Problem # 8:  OTHER MEDICAL PROBLEMS AS NOTED...  Complete Medication List: 1)  [facility] Saline Nasal Spray 0.65 % Soln (Saline) .... As needed 2)  Adult Aspirin Low Strength 81 Mg Tbdp  (Aspirin) .Marland Kitchen.. 1 by mouth once daily 3)  Lisinopril 20 Mg Tabs (Lisinopril) .... Take 1 tablet by mouth once a day 4)  Furosemide 20 Mg Tabs (Furosemide) .... Take 2 tabs by mouth each morning for swelling... 5)  Caltrate 600+d 600-400 Mg-unit Tabs (Calcium carbonate-vitamin d) .Marland Kitchen.. 1 tab twice daily.Marland KitchenMarland Kitchen 6)  Multivitamins Tabs (Multiple vitamin) .... Take 1 tablet by mouth once a day 7)  Vitamin D 1000 Unit Caps (Cholecalciferol) .... Take 1 cap by mouth once daily.Marland KitchenMarland Kitchen 8)  Alprazolam 0.25 Mg Tabs (Alprazolam) .... 1/2 to 1 tab by mouth three times a day as needed for nerves.Marland KitchenMarland Kitchen 9)  Atrovent 0.03 % Soln (Ipratropium bromide) .Marland Kitchen.. 1-2 sprays in each nostril three times a day as needed for dripping...  Patient Instructions: 1)  Today we updated your med list- see below.... 2)  Continue your current meds the same & the FUROSEMIDE fluid pill - take 2 tabs each AM..Marland Kitchen 3)  Call for any questions.Marland KitchenMarland Kitchen 4)  Let's plan a follow up visit in the fall... Prescriptions: ATROVENT 0.03 % SOLN (IPRATROPIUM BROMIDE) 1-2 sprays in each nostril three times a day as needed for dripping...  #1 bottle x 11   Entered and Authorized by:   [name] MD   Signed by:   [name] MD on 06/13/2010   Method used:   Print then Give to Patient   RxID:   [id number] ALPRAZOLAM 0.25 MG  TABS (ALPRAZOLAM) 1/2 to 1 tab by mouth three times a day as needed for nerves...  #100 x 4   Entered and Authorized by:   [name] MD   Signed by:   [name] MD on 06/13/2010   Method used:   Print then Give to Patient   RxID:   [id number] FUROSEMIDE 20 MG  TABS (FUROSEMIDE) Take 2 tabs by mouth each morning for swelling...  #180 x 4   Entered and Authorized by:   [name] MD   Signed by:   [name] MD on 06/13/2010   Method used:   Print then Give to Patient   RxID:   [id number] LISINOPRIL 20 MG  TABS (LISINOPRIL) Take 1 tablet by mouth once a day  #90 x 4   Entered and Authorized by:   [name] MD   Signed by:   [name] MD on 06/13/2010   Method used:   Print then Give to Patient   RxID:   [id number]

## 2011-01-15 NOTE — Assessment & Plan Note (Signed)
Summary: 3-4 months/apc   Primary Care Provider:  Alroy Dust, MD  CC:  3 month ROV & review of mult medical problems....  History of Present Illness: 77 y/o Jessica [name] here for a follow up visit... he has multiple medical problems as noted below...  she is slow moving and deliberate in her actions, but she assures me that she and her husb are doing satis and safe at home... they do not want to consider alternative living arrangements...   ~  Jun10:  follow up visit w/ mult somatic complaints:  1) swelling in legs that tends to decr overnight, on Lasix 20mg /d & we discussed no salt & incr Lasix to 40mg /d;  2) LBP when getting up in the AM- asked to check her mattress, Rx ESTylenol;  3) nasal drainage x 41mo- we discussed treatment regimen w/ Zyrtek, Saline, Astepro Prn...   ~  Jul10:  walk-in today (here w/ her daughter) w/ mult somatic complaints- weight loss, weakness, intermittent dizzy & ringing, persistant bladder problems, etc... she states appetite is fair & review of chart shows weights over the last yr betw 123-129#... today 117# w/ 1-2+edema in feet at present... we prev discussed supplements but she isn't taking them regularly... she is concerned and wants further eval... she has been weak & intermiitently dizzy- BP is trending low on her meds and we discussed decreasing Lisinopril20- 1/2 tab, Amlodipine5- 1/2 tab, Lasix20- 1tab daily...       ** Labs- all WNL, sed= 9.      ** CXR- clear, NAD, kyphoscoliosis...     ** CT Chest/ Abd/ Pelvis= NEG- 3 tiny 3mm right lung nodules (likely granulomas), tiny cysts in liver (otherw neg abd), sm ing hernias (otherw neg pelvis).   ~  Sep10:  she presented to ER in Aug w/ concern for prominent brachial pulses ("I never saw those before, and I was concerned")- this got translated into palpitations & she was adm 8/14-15 for monitoring & r/o MI per cards...       ** EKG showed sinus brady, RBBB, NAD...  Labs normal & MI was R/O...      ** 2DEcho showed  biatrial enlarg, norm LVF w/ mod LVH, sclerotic AV w/ mild AI & MR, norm RV. She has been fine since disch & I explained "hardening of the arteries" to her... she and husb are getting weaker & having more difficulty managing on their own- I have encouraged them to discuss w/ family re: alternative living arrangements.   ~  Oct10:  add-on for persistant mult somatic complaints- discussed w/ pt & daughter... notes runny nose (Astepro helps), burning in feet c/w neuropathy but doesn't want new meds (offered Neurontin), want phys therapy at home for balance issues, & wants f/u DrNesi for bladder issues...   ~  December 27, 2009:  she's had a good 59mo- still "nervous & weak" but balance improved w/ home phys therapy & she does exercises MWF w/ the TV!!!  she had f/u DrNesi for bladder issues- no new meds, states it's not too bad & doesn't want more meds...   ~  March 28, 2010:  she persists w/ mult concerns & is hard to reassure... she states "I have it rough, my husb is an old man"... today she mentions: DM> BS are all WNL;  out of Lasix & feet swelling> no salt & refill Lasix20;  "I have neuropathy> c/o warmness on bottom of feet & up into her legs, but not worth additional med Rx  she says;  c/o knee arthritis & balance off> getting PT at home twice weekly;  "Are grits OK in my diet & how about [place] sausage"...   Current Problem List:  Hx of ALLERGIES (ICD-995.3) - she uses OTC meds as needed, & we prev discussed Zyrtek 10mg Qam, Saline nasal mist, & Astepro 1-2 sp Bid for drainage (one of her chronic complaints)...  Hx of ASTHMATIC BRONCHITIS, ACUTE (ICD-466.0) - no recent exaserbations...  HYPERTENSION (ICD-401.9) - controlled on LISINOPRIL 20mg - 1 daily, NORVASC 5mg - 1daily, & FUROSEMIDE 20mg - 1/2 to 1 tab each AM... BP= 110/70 and she states that she is taking her meds regularly & tolerating them well... denies HA, visual changes, CP, palpit, change in dyspnea, etc... but notes weakness,  intermittently dizzy & persist edema in her feet...   VENOUS INSUFFICIENCY, CHRONIC (ICD-459.81) - there is mild pedal edema bilat... on low sodium diet, elevation, support hose- prev refused diuretic meds due to urinary symptoms, now on LASIX 20mg - 1/2 to 1Qam...  HYPERCHOLESTEROLEMIA (ICD-272.0) - prev on Lipitor 10mg /d, but she stopped this on her own... she has a very high HDL!!!  ~  FLP was 1/08 showed TChol 253, TG 125, HDL 121, LDL 93  ~  FLP 2/09 showed TChol 200, TG 99, HDL 136, LDL 44  ~  FLP 4/11 showed TChol 244, TG 143, HDL 149, LDL 59  GERD (ICD-530.81) - last EGD was 9/05 by DrPerry showing GERD and esoph stricture- dilated... she stopped her Nexium but states that her swallowing is good & she uses PRILOSEC as needed.  DIVERTICULOSIS OF COLON (ICD-562.10) - last colonoscopy was 11/00 showing divertics only...  HX, URINARY INFECTION (ICD-V13.02) & INCONTINENCE symptoms... she has seen DrTannenbaum & staff on bladder exercises and she reported some improvement but has persistant symptoms & never followed up...  ~  7/10:  offered f/u appt w/ Urology vs second opinion consult but she declines...  ~  9/10:  wrote for trial Enablex 7.5mg /d >> no benefit she says.  ~  12/10: saw DrNesi- tried sample med, sl improved, but symptoms not bad enough for more meds, she says.  DEGENERATIVE JOINT DISEASE (ICD-715.90) - notes right shoulder symptoms, but managing OK w/ Tylenol...  OSTEOPOROSIS (ICD-733.00) - she was on Fosamax but had discontinued this on her own... we discussed the need for continued therapy and she was asked to restart Alendronate but she never did...  she takes ca++, MVI, VitD...  SENILE DEMENTIA (ICD-290.0) - she takes ASA 81mg /d... she remains on ARICEPT 10mg /d and appears stable...  ANXIETY (ICD-300.00) - she uses Alpraz 0.25mg  as needed.  Hx of SHINGLES (ICD-053.9)   Allergies (verified): No Known Drug Allergies  Comments:  Nurse/Medical Assistant: The  patient's medications and allergies were reviewed with the patient and were updated in the Medication and Allergy Lists.  Past History:  Past Medical History:  Hx of ALLERGIES (ICD-995.3) Hx of ASTHMATIC BRONCHITIS, ACUTE (ICD-466.0) HYPERTENSION (ICD-401.9) PALPITATIONS (ICD-785.1) VENOUS INSUFFICIENCY, CHRONIC (ICD-459.81) HYPERCHOLESTEROLEMIA (ICD-272.0) GERD (ICD-530.81) DIVERTICULOSIS OF COLON (ICD-562.10) HX, URINARY INFECTION (ICD-V13.02) FREQUENCY, URINARY (ICD-788.41) URINARY INCONTINENCE (ICD-788.30) DEGENERATIVE JOINT DISEASE (ICD-715.90) OSTEOPOROSIS (ICD-733.00) SENILE DEMENTIA (ICD-290.0) ANXIETY (ICD-300.00) Hx of SHINGLES (ICD-053.9)  Past Surgical History: Hysterectomy Tonsillectomy  Family History: Reviewed history from 08/26/2009 and no changes required.  Both of her parents are deceased and her father had a  stroke, but neither one had heart disease.  She has one brother who died  at age 50 of pneumonia.      Social History: Reviewed  history from 08/26/2009 and no changes required.  She lives in [place] with her husband.  They have  been married almost 60 years.  She is a retired Runner, broadcasting/film/video.  She has no  history of alcohol, tobacco, or drug abuse.   Review of Systems      See HPI       The patient complains of decreased hearing, dyspnea on exertion, peripheral edema, muscle weakness, and difficulty walking.  The patient denies anorexia, fever, weight loss, weight gain, vision loss, hoarseness, chest pain, syncope, prolonged cough, headaches, hemoptysis, abdominal pain, melena, hematochezia, severe indigestion/heartburn, hematuria, incontinence, suspicious skin lesions, transient blindness, depression, unusual weight change, abnormal bleeding, enlarged lymph nodes, and angioedema.    Vital Signs:  Patient profile:   77 year old female Height:      60 inches Weight:      123.13 pounds O2 Sat:      97 % on room air Temp:     98.5 degrees F oral Pulse  rate:   68 / minute BP sitting:   110 / 70  (left arm) Cuff size:   regular  Vitals Entered By: Randell Loop CMA (March 28, 2010 9:55 AM)  O2 Sat at Rest %:  97 O2 Flow:  room air CC: 3 month ROV & review of mult medical problems... Is Patient Diabetic? No Pain Assessment Patient in pain? no      Comments meds updated today---pt brought all meds today   Physical Exam  Additional Exam:  WD, WN, 77 y/o Jessica [name] in NAD... GENERAL:  Alert, pleasant & cooperative; she is slow moving as noted... HEENT:  [place]/AT,  EACs- some wax, TMs-wnl, NOSE-clear, THROAT-clear & wnl. NECK:  Supple w/ fairROM; no JVD; normal carotid impulses w/o bruits; no thyromegaly or nodules palpated; no lymphadenopathy. CHEST:  Clear to P & A; without wheezes/ rales/ or rhonchi., noted kyphosis .  HEART:  Regular Rhythm; without murmurs/ rubs/ or gallops. ABDOMEN:  Soft & nontender; normal bowel sounds; no organomegaly or masses detected. EXT: without deformities, mod arthritic changes; no varicose veins/ +venous insuffic/ 1+edema... NEURO:   Alert, answers questions appropriately, no focal neuro deficits... DERM:  No lesions noted; no rash etc...  REVIEW OF WEIGHTS: Jun09 to Jun10 - weights varied betw 123-129# Jun10 weight= 123# w/ 1+ edema reported... Jul10 weight= 117# w/ 1+ edema present... Sep10 weight= 122# w/ mod pedal edema... Oct10 weight= 123# w/ persist edema in feet... Jan11 weight = 119# w/ min edema... Mar11 weight = 123# w/ some edema...     MISC. Report  Procedure date:  03/28/2010  Findings:      BMP (METABOL)   Sodium                    142 mEq/L                   135-145   Potassium                 3.7 mEq/L                   3.5-5.1   Chloride                  101 mEq/L                   96-112   Carbon Dioxide       [H]  34 mEq/L  19-32   Glucose                   91 mg/dL                    16-10   BUN                       10 mg/dL                    9-60    Creatinine                0.9 mg/dL                   4.5-4.0   Calcium              [H]  10.9 mg/dL                  9.8-11.9   GFR                       74.59 mL/min                >60  Hepatic/Liver Function Panel (HEPATIC)   Total Bilirubin           0.3 mg/dL                   1.4-7.8   Direct Bilirubin          0.1 mg/dL                   2.9-5.6   Alkaline Phosphatase      52 U/L                      39-117   AST                       30 U/L                      0-37   ALT                       19 U/L                      0-35   Total Protein             6.9 g/dL                    2.1-3.0   Albumin                   4.1 g/dL                    8.6-5.7  CBC Platelet w/Diff (CBCD)   White Cell Count          4.8 K/uL                    4.5-10.5   Red Cell Count            4.75 Mil/uL                 3.87-5.11   Hemoglobin                13.8 g/dL  12.0-15.0   Hematocrit                40.9 %                      36.0-46.0   MCV                       86.0 fl                     78.0-100.0   Platelet Count            225.0 K/uL                  150.0-400.0   Neutrophil %              56.2 %                      43.0-77.0   Lymphocyte %              31.7 %                      12.0-46.0   Monocyte %                7.3 %                       3.0-12.0   Eosinophils%              3.8 %                       0.0-5.0   Basophils %               1.0 %                       0.0-3.0  Comments:      Lipid Panel (LIPID)   Cholesterol          [H]  244 mg/dL                   8-413   Triglycerides             143.0 mg/dL                 2.4-401.0   HDL                                                   >39.00       RESULT: 149.00 Repeated and verified X2. mg/dL  Cholesterol LDL - Direct                             59.4 mg/dL   TSH (TSH)   FastTSH                   2.08 uIU/mL                 0.35-5.50  Vitamin D (25-Hydroxy) (27253)  Vitamin D (25-Hydroxy)                              41  ng/mL                    30-89   Impression & Recommendations:  Problem # 1:  HYPERTENSION (ICD-401.9) BP stable-  same meds. Her updated medication list for this problem includes:    Norvasc 5 Mg Tabs (Amlodipine besylate) .Marland Kitchen... Take 1 tab by mouth once daily    Lisinopril 20 Mg Tabs (Lisinopril) .Marland Kitchen... Take 1 tablet by mouth once a day    Furosemide 20 Mg Tabs (Furosemide) .Marland Kitchen... Take 1/2 to 1 tab by mouth each morning for swelling...  Orders: T-Vitamin D (25-Hydroxy) [phone]) TLB-BMP (Basic Metabolic Panel-BMET) (80048-METABOL) TLB-Hepatic/Liver Function Pnl (80076-HEPATIC) TLB-CBC Platelet - w/Differential (85025-CBCD) TLB-Lipid Panel (80061-LIPID) TLB-TSH (Thyroid Stimulating Hormone) (84443-TSH)  Problem # 2:  VENOUS INSUFFICIENCY, CHRONIC (ICD-459.81) We discussed no salt, elevation, support hose & LASIX 20mg /d... Orders: Prescription Created Electronically [phone])  Problem # 3:  HYPERCHOLESTEROLEMIA (ICD-272.0) She has an incredible HDL of 149!!! No meds needed...  Problem # 4:  GERD (ICD-530.81) GI is stable...  Problem # 5:  URINARY INCONTINENCE (ICD-788.30) GU w/ persist symptoms but she is not willing to f/u w/ urology & consider additional meds...  Problem # 6:  OSTEOPOROSIS (ICD-733.00) She does not want to restart bisphos therapy...  Problem # 7:  SENILE DEMENTIA (ICD-290.0) Continue aricept, she does not want new meds (eg- Namaenda)...  Problem # 8:  OTHER MEDICAL PROBLEMS AS NOTED>>>  Complete Medication List: 1)  [facility]vs Saline Nasal Spray 0.65 % Soln (Saline) .... As needed 2)  Adult Aspirin Low Strength 81 Mg Tbdp (Aspirin) .Marland Kitchen.. 1 by mouth once daily 3)  Norvasc 5 Mg Tabs (Amlodipine besylate) .... Take 1 tab by mouth once daily 4)  Lisinopril 20 Mg Tabs (Lisinopril) .... Take 1 tablet by mouth once a day 5)  Furosemide 20 Mg Tabs (Furosemide) .... Take 1/2 to 1 tab by mouth each morning for swelling... 6)  Caltrate 600+d 600-400  Mg-unit Tabs (Calcium carbonate-vitamin d) .Marland Kitchen.. 1 tab twice daily.Marland KitchenMarland Kitchen 7)  Multivitamins Tabs (Multiple vitamin) .... Take 1 tablet by mouth once a day 8)  Vitamin D 1000 Unit Caps (Cholecalciferol) .... Take 1 cap by mouth once daily.Marland KitchenMarland Kitchen 9)  Aricept 10 Mg Tabs (Donepezil hcl) .Marland Kitchen.. 1 by mouth once daily 10)  Alprazolam 0.25 Mg Tabs (Alprazolam) .... 1/2 to 1 tab by mouth three times a day as needed for nerves...  Patient Instructions: 1)  Today we updated your med list- see below.... 2)  Continue your current medications the same... 3)  For your swaeeling:  check your feet in the AM>> if the swelling has gone down overnight then only take 1/2 of the Fluid pill Lasix (Furosemeide)... if the swelling has not gone down overnight then take a whole pill that day... 4)  Today we did your follow up bloof work... please call the "phone tree" in a few days for your lab results.Marland KitchenMarland Kitchen 5)  You are doing remarkably well for 77 y/o!!! keep up the good work. 6)  Please schedule a follow-up appointment in 4 months. Prescriptions: FUROSEMIDE 20 MG  TABS (FUROSEMIDE) Take 1/2 to 1 tab by mouth each morning for swelling...  #30 x prn   Entered and Authorized by:   [name] MD   Signed by:   [name] MD on 03/28/2010   Method used:   Print then Give to Patient   RxID:   [id number]

## 2011-01-15 NOTE — Progress Notes (Signed)
Summary: speak to nurse-pt called back  Phone Note Call from Patient Call back at Home Phone [phone]   Caller: Patient Call For: nadel Summary of Call: pt requests to speak to nurse re: recent conversation re: neoropathy. pt says that she has spoken to the specialist that was recommended and the specialist says she doesn't need to see her (pt) and was told to call sn.  Initial call taken by: Tivis Ringer, CNA,  May 10, 2010 3:36 PM  Follow-up for Phone Call        [facility] Vernie Murders  May 10, 2010 3:44 PM  PT WILL SEE TP TOMORROW. (appt made per td). Tivis Ringer, CNA  May 10, 2010 4:05 PM

## 2011-01-15 NOTE — Progress Notes (Signed)
Summary: swelling in leg/ pain  Phone Note Call from Patient Call back at Home Phone [phone]   Caller: Patient Call For: nadel Summary of Call: pt is having swelling in her left leg and foot still not alot better it goes down at night but during the day its swelling and she is having pain in it also Initial call taken by: Lacinda Axon,  August 02, 2010 12:06 PM  Follow-up for Phone Call        Spoke with pt-states she is having edema in left leg and foot slight edema in right leg; comes and goes for months now and pain at night; is concerned and wants to know what to do and if she should have any labs or tests done to ck for clots.Reynaldo Minium CMA  August 02, 2010 12:34 PM   Additional Follow-up for Phone Call Additional follow up Details #1::        per SN---all tests have been done and were negative---once again we rec--no salt, elevate legs, wear support hose all day, continue current meds and if swelling gets worse then SN rec get off of her feet and keep elevated x 2 days in the bed.     [email or web address] Randell Loop CMA  August 02, 2010 3:58 PM     Additional Follow-up for Phone Call Additional follow up Details #2::    Spoke with pt-aware of all recs from SN.Reynaldo Minium CMA  August 03, 2010 3:33 PM

## 2011-01-15 NOTE — Miscellaneous (Signed)
Summary: OT Eval Order/[facility]  OT Eval Order/[facility]   Imported By: Sherian Rein 03/13/2010 15:07:41  _____________________________________________________________________  External Attachment:    Type:   Image     Comment:   External Document

## 2011-01-15 NOTE — Assessment & Plan Note (Signed)
Summary: NP follow up - left leg edema   Primary Provider/Referring Provider:  Alroy Dust, MD  CC:  pt saw Dr. Terrace Arabia 1 week ago for neuropathy eval but was told she does not have it - having increased edema and pain in left calf/ankle/foot .  History of Present Illness: 77 year old female with known hx HTN, osteoporosis, DJD, dementia.    ~  Jun10:  follow up visit w/ mult somatic complaints:  1) swelling in legs that tends to decr overnight, on Lasix 20mg /d & we discussed no salt & incr Lasix to 40mg /d;  2) LBP when getting up in the AM- asked to check her mattress, Rx ESTylenol;  3) nasal drainage x 49mo- we discussed treatment regimen w/ Zyrtek, Saline, Astepro Prn...   ~  Jul10:  walk-in today (here w/ her daughter) w/ mult somatic complaints- weight loss, weakness, intermittent dizzy & ringing, persistant bladder problems, etc... she states appetite is fair & review of chart shows weights over the last yr betw 123-129#... today 117# w/ 1-2+edema in feet at present... we prev discussed supplements but she isn't taking them regularly... she is concerned and wants further eval... she has been weak & intermiitently dizzy- BP is trending low on her meds and we discussed decreasing Lisinopril20- 1/2 tab, Amlodipine5- 1/2 tab, Lasix20- 1tab daily...   July 26, 2009 - acute OV - pt presents with 1 week hx intermittent visible "rapid fluttering" of the vessels in her antecubital area.     May 11, 2010--Presents for increased swelling and pain iin ankles/feet. She has a hx of venous insufficiency w/ ankle edema on lasix 20mg  once daily. This is not getting any better and feet hurt daily. She went to neuro this past week for possible neuropathy but was told she did not  have neuropathy per pt. (no records available yet). Swelling worse in evening, sore around ankles , top of feet and pain w/ walking at times. Feet are getting so big in evening she cant fit into shoes.  Denies chest pain, dyspnea,  orthopnea, hemoptysis, fever, n/v/d, calf pain, fall, injury. Cant get support hose on some days.    Medications Prior to Update: 1)  Cvs Saline Nasal Spray 0.65 % Soln (Saline) .... As Needed 2)  Adult Aspirin Low Strength 81 Mg  Tbdp (Aspirin) .Marland Kitchen.. 1 By Mouth Once Daily 3)  Norvasc 5 Mg  Tabs (Amlodipine Besylate) .... Take 1 Tab By Mouth Once Daily 4)  Lisinopril 20 Mg  Tabs (Lisinopril) .... Take 1 Tablet By Mouth Once A Day 5)  Furosemide 20 Mg  Tabs (Furosemide) .... Take 1/2 To 1 Tab By Mouth Each Morning For Swelling... 6)  Caltrate 600+d 600-400 Mg-Unit  Tabs (Calcium Carbonate-Vitamin D) .Marland Kitchen.. 1 Tab Twice Daily.Marland KitchenMarland Kitchen 7)  Multivitamins  Tabs (Multiple Vitamin) .... Take 1 Tablet By Mouth Once A Day 8)  Vitamin D 1000 Unit Caps (Cholecalciferol) .... Take 1 Cap By Mouth Once Daily.Marland KitchenMarland Kitchen 9)  Aricept 10 Mg  Tabs (Donepezil Hcl) .Marland Kitchen.. 1 By Mouth Once Daily 10)  Alprazolam 0.25 Mg  Tabs (Alprazolam) .... 1/2 To 1 Tab By Mouth Three Times A Day As Needed For Nerves...  Current Medications (verified): 1)  Cvs Saline Nasal Spray 0.65 % Soln (Saline) .... As Needed 2)  Adult Aspirin Low Strength 81 Mg  Tbdp (Aspirin) .Marland Kitchen.. 1 By Mouth Once Daily 3)  Norvasc 5 Mg  Tabs (Amlodipine Besylate) .... Take 1 Tab By Mouth Once Daily 4)  Lisinopril 20  Mg  Tabs (Lisinopril) .... Take 1 Tablet By Mouth Once A Day 5)  Furosemide 20 Mg  Tabs (Furosemide) .... Take 1/2 To 1 Tab By Mouth Each Morning For Swelling... 6)  Caltrate 600+d 600-400 Mg-Unit  Tabs (Calcium Carbonate-Vitamin D) .Marland Kitchen.. 1 Tab Twice Daily.Marland KitchenMarland Kitchen 7)  Multivitamins  Tabs (Multiple Vitamin) .... Take 1 Tablet By Mouth Once A Day 8)  Vitamin D 1000 Unit Caps (Cholecalciferol) .... Take 1 Cap By Mouth Once Daily.Marland KitchenMarland Kitchen 9)  Aricept 10 Mg  Tabs (Donepezil Hcl) .Marland Kitchen.. 1 By Mouth Once Daily 10)  Alprazolam 0.25 Mg  Tabs (Alprazolam) .... 1/2 To 1 Tab By Mouth Three Times A Day As Needed For Nerves...  Allergies (verified): No Known Drug Allergies  Past  History:  Past Surgical History: Last updated: 03/28/2010 Hysterectomy Tonsillectomy  Family History: Last updated: 08/26/2009  Both of her parents are deceased and her father had a  stroke, but neither one had heart disease.  She has one brother who died  at age 34 of pneumonia.      Social History: Last updated: 08/26/2009  She lives in [place] with her husband.  They have  been married almost 60 years.  She is a retired Runner, broadcasting/film/video.  She has no  history of alcohol, tobacco, or drug abuse.   Risk Factors: Smoking Status: never (02/02/2009)  Past Medical History: Hx of ALLERGIES (ICD-995.3) - she uses OTC meds as needed, & we prev discussed Zyrtek 10mg Qam, Saline nasal mist, & Astepro 1-2 sp Bid for drainage (one of her chronic complaints)...  Hx of ASTHMATIC BRONCHITIS, ACUTE (ICD-466.0) - no recent exacerbations...  HYPERTENSION (ICD-401.9) - controlled  on meds   VENOUS INSUFFICIENCY, CHRONIC (ICD-459.81) - chronic pedal edema on . on low sodium diet, elevation, support hose- and lasix 20mg  once daily   HYPERCHOLESTEROLEMIA (ICD-272.0) - prev on Lipitor 10mg /d, but she stopped this on her own... she has a very high HDL!!!  ~  FLP was 1/08 showed TChol 253, TG 125, HDL 121, LDL 93  ~  FLP 2/09 showed TChol 200, TG 99, HDL 136, LDL 44  ~  FLP 4/11 showed TChol 244, TG 143, HDL 149, LDL 59  GERD (ICD-530.81) - last EGD was 9/05 by DrPerry showing GERD and esoph stricture- dilated...  DIVERTICULOSIS OF COLON (ICD-562.10) - last colonoscopy was 11/00 showing divertics only...  HX, URINARY INFECTION (ICD-V13.02) & INCONTINENCE symptoms... she has seen DrTannenbaum & staff    ~  7/10:  offered f/u appt w/ Urology vs second opinion consult but she declines...  ~  9/10:  wrote for trial Enablex 7.5mg /d >> no benefit she says.  ~  12/10: saw DrNesi- tried sample med, sl improved, but symptoms not bad enough for more meds, she says.  DEGENERATIVE JOINT DISEASE (ICD-715.90) -       Review of Systems      See HPI  Vital Signs:  Patient profile:   77 year old female Height:      60 inches Weight:      122 pounds BMI:     23.91 O2 Sat:      99 % on Room air Temp:     97.7 degrees F oral Pulse rate:   55 / minute BP sitting:   124 / 70  (left arm) Cuff size:   regular  Vitals Entered By: Boone Master CNA/MA (May 11, 2010 12:01 PM)  O2 Flow:  Room air CC: pt saw Dr. Terrace Arabia 1 week ago for neuropathy  eval but was told she does not have it - having increased edema and pain in left calf/ankle/foot  Is Patient Diabetic? Yes Comments Medications reviewed with patient Daytime contact number verified with patient. Boone Master CNA/MA  May 11, 2010 12:01 PM    Physical Exam  Additional Exam:  WD, WN, 77 y/o BF in NAD.Marland Kitchenappears younger than stated age.  GENERAL:  Alert, pleasant & cooperative HEENT:  EACs-clear, TMs-wnl, NOSE-clear, THROAT-clear & wnl, mm moist NECK:  Supple, no JVD; normal carotid impulses w/o bruits; no thyromegaly or nodules palpated; no lymphadenopathy. CHEST:  Clear to P & A; without wheezes/ rales/ or rhonchi., noted kyphosis .  HEART:  Regular Rhythm; without murmurs/ rubs/ or gallops. ABDOMEN:  Soft & nontender; normal bowel sounds; no organomegaly or masses detected. EXT: without deformities, mod arthritic changes; no varicose veins/ +venous insuffic. 1-2+ BLE edema along ankles/feet, neg homans sign, pulses intact bialterall NEURO:   Alert, answers questions appropriately.  no focal neuro deficits... DERM:  warm and dry, No lesions noted; no rash etc...     Impression & Recommendations:  Problem # 1:  VENOUS INSUFFICIENCY, CHRONIC (ICD-459.81) Worsening venous insufficiency of lower ext. w/ neg neuro w/up for neuropathy will check venous doppler along w/ labs  stop norvasc for now d/t potential for fluid retention (blp well controlled) gentle diuresis for next few days REc:  Stop norvasc  INcrease Furosemide 20mg  2 tabs once daily  for 3 days then back to 1 tab once daily  low salt diet, keep legs elevated. support stocking daily and remove at bedtime.  We are setting you up for doppler of legs  follow up 2 week and as needed  I will call with labs.  Orders: TLB-BMP (Basic Metabolic Panel-BMET) (80048-METABOL) TLB-BNP (B-Natriuretic Peptide) (83880-BNPR) TLB-TSH (Thyroid Stimulating Hormone) (84443-TSH) TLB-Uric Acid, Blood (84550-URIC) Doppler Referral (Doppler) Est. Patient Level IV ([address]109)  Complete Medication List: 1)  Cvs Saline Nasal Spray 0.65 % Soln (Saline) .... As needed 2)  Adult Aspirin Low Strength 81 Mg Tbdp (Aspirin) .Marland Kitchen.. 1 by mouth once daily 3)  Norvasc 5 Mg Tabs (Amlodipine besylate) .... Take 1 tab by mouth once daily 4)  Lisinopril 20 Mg Tabs (Lisinopril) .... Take 1 tablet by mouth once a day 5)  Furosemide 20 Mg Tabs (Furosemide) .... Take 1/2 to 1 tab by mouth each morning for swelling... 6)  Caltrate 600+d 600-400 Mg-unit Tabs (Calcium carbonate-vitamin d) .Marland Kitchen.. 1 tab twice daily.Marland KitchenMarland Kitchen 7)  Multivitamins Tabs (Multiple vitamin) .... Take 1 tablet by mouth once a day 8)  Vitamin D 1000 Unit Caps (Cholecalciferol) .... Take 1 cap by mouth once daily.Marland KitchenMarland Kitchen 9)  Aricept 10 Mg Tabs (Donepezil hcl) .Marland Kitchen.. 1 by mouth once daily 10)  Alprazolam 0.25 Mg Tabs (Alprazolam) .... 1/2 to 1 tab by mouth three times a day as needed for nerves...  Patient Instructions: 1)  Stop norvasc  2)  INcrease Furosemide 20mg  2 tabs once daily for 3 days then back to 1 tab once daily  3)  low salt diet, keep legs elevated. support stocking daily and remove at bedtime.  4)  We are setting you up for doppler of legs  5)  follow up 2 week and as needed  6)  I will call with labs.     Appended Document: NP follow up - left leg edema neg venous doppler prelim report sent

## 2011-01-15 NOTE — Progress Notes (Signed)
Summary: elevated b/p > increase lasix, add amlodipine  Phone Note Call from Patient   Caller: Patient Call For: nadel Summary of Call: elevated blood pressure. Initial call taken by: Rickard Patience,  October 22, 2010 10:21 AM  Follow-up for Phone Call        pt states her BP has been running high over last 2 days.  She staets this AM it was 199/87, and she also c/o having a headache this morning. Pt states BP ran the same yesterday as weel. She denies any vision disturbance, swelling has improved. Pt staets she has been taking medications daily. Pt requesting an appt. Please advise.Carron Curie CMA  October 22, 2010 11:23 AM   Additional Follow-up for Phone Call Additional follow up Details #1::        per SN---at her last ov BP was 122/60 on 10/27...she is on the lisinopril 20mg  daily and lasix 20  1 or 2 tabs every morning---rec increasing the lasix/furosemide 20mg  to 2 tabs each morning and add amlodipine 5mg   once daily  #30 with 5 refills and give this a chance to work but keep a check on her BP.  thanks Randell Loop CMA  October 22, 2010 1:43 PM     Additional Follow-up for Phone Call Additional follow up Details #2::    Called, spoke with pt.  She was informed of above recs per SN.  She verbalized understanding and is aware amlodipine rx [facility] [address]  Gweneth Dimitri RN  October 22, 2010 2:23 PM   New/Updated Medications: AMLODIPINE BESYLATE 5 MG TABS (AMLODIPINE BESYLATE) Take 1 tablet by mouth once a day Prescriptions: AMLODIPINE BESYLATE 5 MG TABS (AMLODIPINE BESYLATE) Take 1 tablet by mouth once a day  #30 x 5   Entered by:   Gweneth Dimitri RN   Authorized by:   [name] MD   Signed by:   Gweneth Dimitri RN on 10/22/2010   Method used:   [facility]  [address] [phone]ail[facility]       [address]       [place], [place]  [address]       Ph: [phone] or [phone]       Fax: [phone]  RxID:   [id number]

## 2011-01-15 NOTE — Consult Note (Signed)
Summary: [facility]  [facility]   Imported By: Sherian Rein 05/17/2010 15:09:33  _____________________________________________________________________  External Attachment:    Type:   Image     Comment:   External Document

## 2011-01-15 NOTE — Letter (Signed)
Summary: [facility]  [address]   [place], [place] [address]   Phone: [phone]  Fax: [phone]    06/13/2010     To Whom It May Concern,    [name] is a 77 year old patient of mine that is followed for general medical purposes.   She has High Blood Pressure, venous insufficiency, edema, degenerative arthritis, osteoporosis and mild senile dementia.  A mini mental status exam today shows a score  of 26/30--a mild impairment.   I hope this information is helful to you.          Sincerely,       Lonzo Cloud. Elayne Snare. D.

## 2011-01-17 NOTE — Progress Notes (Signed)
Summary: bp/ head / leg-req to see sn today  Phone Note Call from Patient Call back at Home Phone [phone]   Caller: Patient Call For: nadel Summary of Call: pt wants to come in today and see dr nadel. she says she feels like her bp may be elevated. also her L leg is "burning" and her head hurts. prefers to see dr Kriste Basque.  Initial call taken by: Tivis Ringer, CNA,  November 30, 2010 10:35 AM  Follow-up for Phone Call        Pt states her head hurts and she has a burning feeling in her left leg and is concerned her BP is elevated and wants an appt today. I advised Dr. Kriste Basque does not have any available appts this AM and willnto be in this PM, but we can get her into see TP today. Pt set to see TP today at 3:30.Carron Curie CMA  November 30, 2010 11:00 AM

## 2011-01-17 NOTE — Assessment & Plan Note (Signed)
Summary: Acute NP follow up - multiple complaints   Primary Provider/Referring Provider:  Alroy Dust, MD  CC:  LE edema in left ankle/foot w/ numbness, occasional low back pain, occasional right hip pain x97months, and would also like to discuss BP stating that the norvasc does not always keep it lower.  History of Present Illness:  77 yo with known hx of HTN.   July 26, 2009 - acute OV - pt presents with 1 week hx intermittent visible "rapid fluttering" of the vessels in her antecubital area.     May 11, 2010--Presents for increased swelling and pain iin ankles/feet. She has a hx of venous insufficiency w/ ankle edema on lasix 20mg  once daily. This is not getting any better and feet hurt daily. She went to neuro this past week for possible neuropathy but was told she did not  have neuropathy per pt. (no records available yet). Swelling worse in evening, sore around ankles , top of feet and pain w/ walking at times. Feet are getting so big in evening she cant fit into shoes.    ~  June 13, 2010:  once again she has mult somatic complaints> legs swelling, nose running, wants advise regarding MVI, and letter to lawyer stating she's able to handle her finances... re: leg swelling> we reviewed chr VI/ edema & the need to elim sodium, elevate legs, wear support hose, & stay on the Lasix20- 2tabsQam (she wants to [facility] a specialist at [facility]);  re: runny nose> try Atrovent Nasal spray;  I rec Women's MVI to her for daily use;  MMSE today = 26/30 (missing place, & 2/3 recall items after distraction) see letter she requested.   ~  October 11, 2010:  she is upset that she had to wait 14mo for f/u appt- wants them Q676mo... she turned 77 y/o yest... she has list of complaints:  feet burning> known neuropathy but not bad enough for new med yet;  weak back> we discussed back brace Prn;  ?do I have bloos clots/> no;  eyes burning, nose running, mult complaints about husb> all discussed...  breathing is stable;  no CP,  palpit, ch in SOB;  BP controlled on meds;  otherw stable & she will ret 76mo fasting for labs.     11/30/2010-Pt returns for work in visit. She has several issues to discuss. Her family member is with her today. Continues to complain of  LE edema in left ankle/foot w/ intermittent numbness. She has had extensive workup in past w/ unrevealing labs, neg venous doppler. felt to be consistent with venous insufficiency. Also discussed in past , neuroptathy not bad enough for meds-she declineds. Has some intermittent  low back pain, occasional right hip pain x75months, no radicular symptoms or ext weakness. Would also like to discuss BP stating that the norvasc does not always keep it lower, on avg runs  ~120-140. Denies chest pain, orthopnea, hemoptysis, fever, n/v/d, headache.   Medications Prior to Update: 1)  Cvs Saline Nasal Spray 0.65 % Soln (Saline) .... As Needed 2)  Amlodipine Besylate 5 Mg Tabs (Amlodipine Besylate) .... Take 1 Tablet By Mouth Once A Day 3)  Adult Aspirin Low Strength 81 Mg  Tbdp (Aspirin) .Marland Kitchen.. 1 By Mouth Once Daily 4)  Lisinopril 20 Mg  Tabs (Lisinopril) .... Take 1 Tablet By Mouth Once A Day 5)  Furosemide 20 Mg  Tabs (Furosemide) .... Take 2 Tabs By Mouth Each Morning For Swelling... 6)  Caltrate 600+d 600-400 Mg-Unit  Tabs (  Calcium Carbonate-Vitamin D) .Marland Kitchen.. 1 Tab Twice Daily.Marland KitchenMarland Kitchen 7)  Multivitamins  Tabs (Multiple Vitamin) .... Take 1 Tablet By Mouth Once A Day 8)  Vitamin D 1000 Unit Caps (Cholecalciferol) .... Take 1 Cap By Mouth Once Daily.Marland KitchenMarland Kitchen 9)  Donepezil Hcl 10 Mg Tabs (Donepezil Hcl) .... Take 1 Tablet By Mouth Once A Day 10)  Alprazolam 0.25 Mg  Tabs (Alprazolam) .... 1/2 To 1 Tab By Mouth Three Times A Day As Needed For Nerves...  Current Medications (verified): 1)  Adult Aspirin Low Strength 81 Mg  Tbdp (Aspirin) .Marland Kitchen.. 1 By Mouth Once Daily 2)  Lisinopril 20 Mg  Tabs (Lisinopril) .... Take 1 Tablet By Mouth Once A Day 3)  Furosemide 20 Mg  Tabs (Furosemide) ....  Take 2 Tabs By Mouth Each Morning For Swelling... 4)  Caltrate 600+d 600-400 Mg-Unit  Tabs (Calcium Carbonate-Vitamin D) .Marland Kitchen.. 1 Tab Twice Daily.Marland KitchenMarland Kitchen 5)  Multivitamins  Tabs (Multiple Vitamin) .... Take 1 Tablet By Mouth Once A Day 6)  Vitamin D 1000 Unit Caps (Cholecalciferol) .... Take 1 Cap By Mouth Once Daily.Marland KitchenMarland Kitchen 7)  Alprazolam 0.25 Mg  Tabs (Alprazolam) .... 1/2 To 1 Tab By Mouth Three Times A Day As Needed For Nerves... 8)  Donepezil Hcl 10 Mg Tabs (Donepezil Hcl) .... Take 1 Tablet By Mouth Once A Day 9)  Amlodipine Besylate 5 Mg Tabs (Amlodipine Besylate) .... Take 1 Tablet By Mouth Once A Day  Allergies (verified): No Known Drug Allergies  Past History:  Past Medical History: Last updated: 10/11/2010 Hx of ALLERGIES (ICD-995.3) Hx of ASTHMATIC BRONCHITIS, ACUTE (ICD-466.0) HYPERTENSION (ICD-401.9) PALPITATIONS (ICD-785.1) VENOUS INSUFFICIENCY, CHRONIC (ICD-459.81) HYPERCHOLESTEROLEMIA (ICD-272.0) GERD (ICD-530.81) DIVERTICULOSIS OF COLON (ICD-562.10) HX, URINARY INFECTION (ICD-V13.02) FREQUENCY, URINARY (ICD-788.41) URINARY INCONTINENCE (ICD-788.30) DEGENERATIVE JOINT DISEASE (ICD-715.90) OSTEOPOROSIS (ICD-733.00) SENILE DEMENTIA (ICD-290.0) NEUROPATHY (ICD-355.9) ANXIETY (ICD-300.00) Hx of SHINGLES (ICD-053.9)  Family History: Last updated: 10/11/2010 Both of her parents are deceased and her father had a  stroke, but neither one had heart disease.  She has one brother who died  at age 82 of pneumonia  Social History: Last updated: 10/11/2010 She lives in [place] with her husband.  They have  been married almost 60 years.  She has no  history of alcohol, tobacco, or drug abuse.  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  Risk Factors: Smoking Status: never (10/11/2010)  Review of Systems      See HPI  Vital Signs:  Patient profile:   77 year old female Height:      60 inches Weight:      123.13 pounds BMI:     24.13 O2 Sat:       98 % on Room air Temp:     98.0 degrees F oral Pulse rate:   52 / minute BP sitting:   124 / 66  (left arm) Cuff size:   regular  Vitals Entered By: Boone Master CNA/MA (November 30, 2010 3:40 PM)  O2 Flow:  Room air CC: LE edema in left ankle/foot w/ numbness, occasional low back pain, occasional right hip pain x81months, would also like to discuss BP stating that the norvasc does not always keep it lower Is Patient Diabetic? No Comments Medications reviewed with patient Daytime contact number verified with patient. Boone Master CNA/MA  November 30, 2010 3:40 PM    Physical Exam  Additional Exam:  WD, WN, 77 y/o BF in NAD.Marland Kitchenappears younger than stated age.  GENERAL:  Alert, pleasant & cooperative HEENT:  EACs-clear,  TMs-wnl, NOSE-clear, THROAT-clear & wnl, mm moist NECK:  Supple, no JVD; normal carotid impulses w/o bruits; no thyromegaly or nodules palpated; no lymphadenopathy. CHEST:  Clear to P & A; without wheezes/ rales/ or rhonchi., noted kyphosis .  HEART:  Regular Rhythm; without murmurs/ rubs/ or gallops. ABDOMEN:  Soft & nontender; normal bowel sounds; no organomegaly or masses detected. EXT: without deformities, mod arthritic changes; no varicose veins/ +venous insuffic. 1+ BLE edema along ankles/feet, neg homans sign, pulses intact bialteral, neg SLR  NEURO:   Alert, answers questions appropriately.  no focal neuro deficits... DERM:  warm and dry, No lesions noted; no rash etc...     Impression & Recommendations:  Problem # 1:  VENOUS INSUFFICIENCY, CHRONIC (ICD-459.81)  advised again on elevating legs.  low salt  support stockings.   Orders: Est. Patient Level IV ([address]109)  Problem # 2:  HYPERTENSION (ICD-401.9)  controlled on rx  low salt diet Her updated medication list for this problem includes:    Amlodipine Besylate 5 Mg Tabs (Amlodipine besylate) .Marland Kitchen... Take 1 tablet by mouth once a day    Lisinopril 20 Mg Tabs (Lisinopril) .Marland Kitchen... Take 1 tablet by  mouth once a day    Furosemide 20 Mg Tabs (Furosemide) .Marland Kitchen... Take 2 tabs by mouth each morning for swelling...  BP today: 124/66 Prior BP: 122/60 (10/11/2010)  Labs Reviewed: K+: 4.0 (05/11/2010) Creat: : 0.9 (05/11/2010)   Chol: 244 (03/28/2010)   HDL: 149.00 Repeated and verified X2. mg/dL (60/45/4098)   LDL: 44 (01/26/2008)   TG: 143.0 (03/28/2010)  Orders: Est. Patient Level IV (11914)  Problem # 3:  DEGENERATIVE JOINT DISEASE (ICD-715.90)  low back pain and hip pain-arthritic flare Plan trial of mobic as needed  heat and ice as needed  Her updated medication list for this problem includes:    Adult Aspirin Low Strength 81 Mg Tbdp (Aspirin) .Marland Kitchen... 1 by mouth once daily    Mobic 7.5 Mg Tabs (Meloxicam) .Marland Kitchen... 1 by mouth once daily w/ food  as needed for joint/back pain  Orders: Est. Patient Level IV (78295)  Medications Added to Medication List This Visit: 1)  Mobic 7.5 Mg Tabs (Meloxicam) .Marland Kitchen.. 1 by mouth once daily w/ food  as needed for joint/back pain  Patient Instructions: 1)  Trial of Mobic 7.5mg  once daily with food for joint/back pain  2)  Warm heat to back and hips.  3)  Please contact office for sooner follow up if symptoms do not improve or worsen  Prescriptions: MOBIC 7.5 MG TABS (MELOXICAM) 1 by mouth once daily w/ food  as needed for joint/back pain  #30 x 5   Entered and Authorized by:   Rubye Oaks NP   Signed by:   Rubye Oaks NP on 11/30/2010   Method used:   [phone][facility]ail[facility]       [address]       [place], [place]  [id number]       Ph: [phone] or [phone]       Fax: [phone]   RxID:   [id number]

## 2011-01-17 NOTE — Assessment & Plan Note (Signed)
Summary: rov/la   Primary Care Provider:  Alroy Dust, MD  CC:  3 month ROV & review of mult medical problems....  History of Present Illness: 77 y/o BF here for a follow up visit... She is 77 y/o w/ hx HBP, VI & chr edema, DJD & osteoporosis, and a mild senile dementia... she & her husb still live on their own but are having numerous difficulties while still resisting any thoughts of assisted living etc... she has a mild senile dementia> eg- once got concerned because she saw an impulse in her left antecubital fossa (tortuous brachial art), went to the ER & this got translated to palpitations & lead to full ER cardiac eval= neg... MMSE Jun11 = 26/30 & started on Aricept.   ~  Apr11:  she persists w/ mult concerns & is hard to reassure... she states "I have it rough, my husb is an old man"... today she mentions: DM> BS are all WNL;  out of Lasix & feet swelling> no salt & refill Lasix20;  "I have neuropathy"> c/o warmness on bottom of feet & up into her legs, but not worth additional med Rx she says;  c/o knee arthritis & balance off> getting PT at home twice weekly;  "Are grits OK in my diet & how about [place] sausage"...  ~  Jun11:  once again she has mult somatic complaints> legs swelling, nose running, wants advise regarding MVI, and letter to lawyer stating she's able to handle her finances... re: leg swelling> we reviewed chr VI/ edema & the need to elim sodium, elevate legs, wear support hose, & stay on the Lasix20- 2tabsQam (she wants to see a specialist at [facility]);  re: runny nose> try Atrovent Nasal spray;  I rec Women's MVI to her for daily use;  MMSE today = 26/30 (missing place, & 2/3 recall items after distraction) see letter she requested.   ~  Oct11:  she is upset that she had to wait 28mo for f/u appt- wants them Q59mo... she turned 77 y/o yest... she has list of complaints:  feet burning> known neuropathy but not bad enough for new med yet;  weak back> we discussed back brace Prn;  ?do I  have blood clots/> no;  eyes burning, nose running, mult complaints about husb> all discussed...  breathing is stable;  no CP, palpit, ch in SOB;  BP controlled on meds;  otherw stable & she will ret 8mo fasting for labs.   ~  December 26, 2010:  clinically stable- she persists w/ mult complaints:  hips> offered Ortho eval- "it's not that bad", has Mobic for Prn use;  runny nose: chr complaint, ?doesn't remember our numerous prev conversations- rec OTC Zyrtek;  "[name] needs his nerve pill"> Alpraz 0.25mg  written;  Timea wants labs done (ate strawberries & milk this AM)...   Current Problem List:  Hx of ALLERGIES (ICD-995.3) - she uses OTC meds as needed, & we prev discussed Zyrtek 10mg Qam, Saline nasal mist, & ATROVENT Nasal 0.03% Tid for drainage (one of her chronic complaints)...  Hx of ASTHMATIC BRONCHITIS, ACUTE (ICD-466.0) - no recent exacerbations...  HYPERTENSION (ICD-401.9) - controlled on LISINOPRIL 20mg - 1 daily & FUROSEMIDE 20mg - 1-2 each AM... BP= 120/74 and she states that she is taking her meds regularly & tolerating them well... denies HA, visual changes, CP, palpit, change in dyspnea, etc... but notes weakness, intermittently dizzy & persist edema in her feet...   VENOUS INSUFFICIENCY, CHRONIC (ICD-459.81) - there is mild pedal edema bilat L>R.Marland KitchenMarland Kitchen  on low sodium diet, elevation, support hose- prev refused diuretic meds due to urinary symptoms, now on LASIX 20mg - 1-2 Qam.  HYPERCHOLESTEROLEMIA (ICD-272.0) - prev on Lip10, but she stopped this on her own... she has a very high HDL!!!  ~  FLP was 1/08 showed TChol 253, TG 125, HDL 121, LDL 93  ~  FLP 2/09 showed TChol 200, TG 99, HDL 136, LDL 44  ~  FLP 4/11 showed TChol 244, TG 143, HDL 149, LDL 59  ~  FLP 1/12 showed TChol 266, TG 102, HDL 151, LDL 83 >> BEST HDL I'VE EVER SEEN  GERD (QMV-784.69) - last EGD was 9/05 by DrPerry showing GERD and esoph stricture- dilated... she stopped her Nexium but states that her swallowing is good &  she uses PRILOSEC as needed.  DIVERTICULOSIS OF COLON (ICD-562.10) - last colonoscopy was 11/00 showing divertics only...  HX, URINARY INFECTION (ICD-V13.02) & INCONTINENCE symptoms... she has seen DrTannenbaum & staff on bladder exercises and she reported some improvement but has persistant symptoms & never followed up...  ~  7/10:  offered f/u appt w/ Urology vs second opinion consult but she declines...  ~  9/10:  wrote for trial Enablex 7.5mg /d >> no benefit she says.  ~  12/10: saw DrNesi- tried sample med, sl improved, but symptoms not bad enough for more meds, she says.  DEGENERATIVE JOINT DISEASE (ICD-715.90) - notes right shoulder symptoms, but managing OK w/ Tylenol; also right hip & lo0w back pain...  she also c/o neuropathic "burning" in legs but not bad enough for additional meds she says...  OSTEOPOROSIS (ICD-733.00) - she was on Fosamax but had discontinued this on her own... we discussed the need for continued therapy and she was asked to restart Alendronate but she never did...  she takes Ca++, MVI, VitD...  SENILE DEMENTIA (ICD-290.0) - she takes ASA 81mg /d... she tried Aricept in the past but didn't notice any improvement therefore stopped it.  ~  16/11: MMSE showed 26/30 & she agreed to try DONEPEZIL 10mg /d...  ANXIETY (ICD-300.00) - she uses Alpraz 0.25mg  as needed.  Hx of SHINGLES (ICD-053.9)   Preventive Screening-Counseling & Management  Alcohol-Tobacco     Smoking Status: never  Allergies (verified): No Known Drug Allergies  Comments:  Nurse/Medical Assistant: The patient's medications and allergies were reviewed with the patient and were updated in the Medication and Allergy Lists.  Past History:  Past Medical History: Hx of ALLERGIES (ICD-995.3) Hx of ASTHMATIC BRONCHITIS, ACUTE (ICD-466.0) HYPERTENSION (ICD-401.9) PALPITATIONS (ICD-785.1) VENOUS INSUFFICIENCY, CHRONIC (ICD-459.81) HYPERCHOLESTEROLEMIA (ICD-272.0) GERD (ICD-530.81) DIVERTICULOSIS  OF COLON (ICD-562.10) HX, URINARY INFECTION (ICD-V13.02) FREQUENCY, URINARY (ICD-788.41) URINARY INCONTINENCE (ICD-788.30) DEGENERATIVE JOINT DISEASE (ICD-715.90) OSTEOPOROSIS (ICD-733.00) SENILE DEMENTIA (ICD-290.0) NEUROPATHY (ICD-355.9) ANXIETY (ICD-300.00) Hx of SHINGLES (ICD-053.9)  Past Surgical History: Hysterectomy Tonsillectomy  Family History: Reviewed history from 10/11/2010 and no changes required. Both of her parents are deceased and her father had a  stroke, but neither one had heart disease.  She has one brother who died  at age 74 of pneumonia  Social History: Reviewed history from 10/11/2010 and no changes required. She lives in [place] with her husband.  They have  been married almost 60 years.  She has no  history of alcohol, tobacco, or drug abuse.  She is a retired Tourist information centre manager, Manufacturing engineer, retired Barrister's clerk  Review of Systems      See HPI       The patient complains of decreased hearing, dyspnea on exertion, peripheral edema, incontinence,  muscle weakness, and difficulty walking.  The patient denies anorexia, fever, weight loss, weight gain, vision loss, hoarseness, chest pain, syncope, prolonged cough, headaches, hemoptysis, abdominal pain, melena, hematochezia, severe indigestion/heartburn, hematuria, suspicious skin lesions, transient blindness, depression, unusual weight change, abnormal bleeding, enlarged lymph nodes, and angioedema.    Vital Signs:  Patient profile:   77 year old female Height:      60 inches Weight:      117.38 pounds BMI:     23.01 O2 Sat:      98 % on Room air Temp:     97.0 degrees F oral Pulse rate:   54 / minute BP sitting:   120 / 74  (left arm) Cuff size:   regular  Vitals Entered By: Randell Loop CMA (December 26, 2010 12:18 PM)  O2 Sat at Rest %:  98 O2 Flow:  Room air CC: 3 month ROV & review of mult medical problems... Is Patient Diabetic? No Pain Assessment Patient in pain? yes      Comments no  changes in meds today   Physical Exam  Additional Exam:  WD, WN, 77 y/o BF in NAD... GENERAL:  Alert, pleasant & cooperative; she is slow moving as noted... HEENT:  [place]/AT, EOM-full, EACs- some wax, TMs-wnl, NOSE-clear, THROAT-clear & wnl. NECK:  Supple w/ fairROM; no JVD; normal carotid impulses w/o bruits; no thyromegaly or nodules palpated; no lymphadenopathy. CHEST:  Clear to P & A; without wheezes/ rales/ or rhonchi., noted kyphosis... HEART:  Regular Rhythm; without murmurs/ rubs/ or gallops heard... ABDOMEN:  Soft & nontender; normal bowel sounds; no organomegaly or masses detected. EXT: without deformities, mod arthritic changes; no varicose veins/ +venous insuffic/ 1-2+edema... NEURO:   Alert, answers questions appropriately, no focal neuro deficits... DERM:  No lesions noted; no rash etc...  MMSE> 06/13/10> Score 26/30 (missed place, 2/3 recall after distraction, & copy figure)...  REVIEW OF WEIGHTS: Jun09 to Jun10 - weights varied betw 123-129# Jun10 weight= 123# w/ 1+ edema reported. Jul10 weight= 117# w/ 1+ edema present. Sep10 weight= 122# w/ mod pedal edema. Jan11 weight = 119# w/ min edema. Jun11 weight = 123# w/ persist edema. NGE95 weight = 118# w/ min swelling    MISC. Report  Procedure date:  12/26/2010  Findings:      BMP (METABOL)   Sodium                    144 mEq/L                   135-145   Potassium                 4.0 mEq/L                   3.5-5.1   Chloride                  104 mEq/L                   96-112   Carbon Dioxide            32 mEq/L                    19-32   Glucose                   72 mg/dL  70-99   BUN                       16 mg/dL                    7-42   Creatinine                0.9 mg/dL                   5.9-5.6   Calcium              [H]  11.7 mg/dL                  3.8-75.6   GFR                       73.53 mL/min                >60.00  Hepatic/Liver Function Panel (HEPATIC)   Total Bilirubin            0.6 mg/dL                   4.3-3.2   Direct Bilirubin          0.1 mg/dL                   9.5-1.8   Alkaline Phosphatase      54 U/L                      39-117   AST                       25 U/L                      0-37   ALT                       13 U/L                      0-35   Total Protein             6.5 g/dL                    8.4-1.6   Albumin                   4.1 g/dL                    6.0-6.3  CBC Platelet w/Diff (CBCD)   White Cell Count          5.4 K/uL                    4.5-10.5   Red Cell Count            4.59 Mil/uL                 3.87-5.11   Hemoglobin                13.4 g/dL                   01.6-01.0   Hematocrit                40.3 %  36.0-46.0   MCV                       87.8 fl                     78.0-100.0   Platelet Count            211.0 K/uL                  150.0-400.0   Neutrophil %              53.0 %                      43.0-77.0   Lymphocyte %              34.5 %                      12.0-46.0   Monocyte %                9.9 %                       3.0-12.0   Eosinophils%              1.6 %                       0.0-5.0   Basophils %               1.0 %                       0.0-3.0  Comments:      Lipid Panel (LIPID)   Cholesterol          [H]  266 mg/dL                   9-562   Triglycerides             102.0 mg/dL                 1.3-086.5   HDL                       784.69 mg/dL                >62.95  Cholesterol LDL - Direct                             82.9 mg/dL  Tests: (5) TSH (TSH)   FastTSH                   1.49 uIU/mL                 0.35-5.50   Impression & Recommendations:  Problem # 1:  Hx of ASTHMATIC BRONCHITIS, ACUTE (ICD-466.0) No recent exac... she c/o runny nose mostly> rec Zyrtek, Astelin NS, etc...  Problem # 2:  HYPERTENSION (ICD-401.9) BP controlled>  same meds. Her updated medication list for this problem includes:    Amlodipine Besylate 5 Mg Tabs (Amlodipine besylate) .Marland Kitchen... Take 1  tablet by mouth once a day    Lisinopril 20 Mg Tabs (Lisinopril) .Marland Kitchen... Take 1 tablet by mouth once a day    Furosemide 20 Mg Tabs (Furosemide) .Marland Kitchen... Take 2 tabs by mouth each morning for swelling.Marland KitchenMarland Kitchen  Orders: TLB-BMP (Basic Metabolic Panel-BMET) (80048-METABOL) TLB-Hepatic/Liver Function Pnl (80076-HEPATIC) TLB-CBC Platelet - w/Differential (85025-CBCD) TLB-Lipid Panel (80061-LIPID) TLB-TSH (Thyroid Stimulating Hormone) (84443-TSH)  Problem # 3:  VENOUS INSUFFICIENCY, CHRONIC (ICD-459.81) Aware>  reviewed no salt, elevation, support hose, diuretics...  Problem # 4:  HYPERCHOLESTEROLEMIA (ICD-272.0) She is on diet Rx alone w/ one of the highest HDLs I've ever seen...  Problem # 5:  GERD (ICD-530.81) GI is stable>  continue same...  Problem # 6:  URINARY INCONTINENCE (ICD-788.30) GU is stable>  continue monitoring the situation...  Problem # 7:  DEGENERATIVE JOINT DISEASE (ICD-715.90) Severe DJD w/ mult complaints but she copes well... Her updated medication list for this problem includes:    Adult Aspirin Low Strength 81 Mg Tbdp (Aspirin) .Marland Kitchen... 1 by mouth once daily    Mobic 7.5 Mg Tabs (Meloxicam) .Marland Kitchen... 1 by mouth once daily w/ food  as needed for joint/back pain  Problem # 8:  SENILE DEMENTIA (ICD-290.0) As noted...  Orders: TLB-BMP (Basic Metabolic Panel-BMET) (80048-METABOL) TLB-Hepatic/Liver Function Pnl (80076-HEPATIC) TLB-CBC Platelet - w/Differential (85025-CBCD) TLB-Lipid Panel (80061-LIPID) TLB-TSH (Thyroid Stimulating Hormone) (84443-TSH)  Complete Medication List: 1)  Adult Aspirin Low Strength 81 Mg Tbdp (Aspirin) .Marland Kitchen.. 1 by mouth once daily 2)  Amlodipine Besylate 5 Mg Tabs (Amlodipine besylate) .... Take 1 tablet by mouth once a day 3)  Lisinopril 20 Mg Tabs (Lisinopril) .... Take 1 tablet by mouth once a day 4)  Furosemide 20 Mg Tabs (Furosemide) .... Take 2 tabs by mouth each morning for swelling... 5)  Mobic 7.5 Mg Tabs (Meloxicam) .Marland Kitchen.. 1 by mouth once daily  w/ food  as needed for joint/back pain 6)  Caltrate 600+d 600-400 Mg-unit Tabs (Calcium carbonate-vitamin d) .Marland Kitchen.. 1 tab twice daily.Marland KitchenMarland Kitchen 7)  Multivitamins Tabs (Multiple vitamin) .... Take 1 tablet by mouth once a day 8)  Vitamin D 1000 Unit Caps (Cholecalciferol) .... Take 1 cap by mouth once daily.Marland KitchenMarland Kitchen 9)  Donepezil Hcl 10 Mg Tabs (Donepezil hcl) .... Take 1 tablet by mouth once a day 10)  Alprazolam 0.25 Mg Tabs (Alprazolam) .... 1/2 to 1 tab by mouth three times a day as needed for nerves...  Patient Instructions: 1)  Today we updated your med list- see below.... 2)  Continue your current meds the same... 3)  Don't forget the ZYRTEK 10mg  OTC for the runny nose.Marland KitchenMarland Kitchen 4)  Today we did your follow up FASTING blood work... please call the "phone tree" in a few days for your lab results.Marland KitchenMarland Kitchen 5)  Call for any questions.Marland KitchenMarland Kitchen 6)  Please schedule a follow-up appointment in 3 months.

## 2011-01-17 NOTE — Medication Information (Signed)
Summary: [facility] for Lisinopril[facility]  [facility]   Imported By: Sherian Rein 12/06/2010 12:02:09  _____________________________________________________________________  External Attachment:    Type:   Image     Comment:   External Document

## 2011-01-24 ENCOUNTER — Telehealth (INDEPENDENT_AMBULATORY_CARE_PROVIDER_SITE_OTHER): Payer: Self-pay | Admitting: *Deleted

## 2011-01-28 ENCOUNTER — Telehealth: Payer: Self-pay | Admitting: Pulmonary Disease

## 2011-01-29 ENCOUNTER — Telehealth (INDEPENDENT_AMBULATORY_CARE_PROVIDER_SITE_OTHER): Payer: Self-pay | Admitting: *Deleted

## 2011-01-31 ENCOUNTER — Telehealth: Payer: Self-pay | Admitting: Pulmonary Disease

## 2011-01-31 NOTE — Progress Notes (Signed)
Summary: Leg/hip pain  Phone Note Call from Patient Call back at Home Phone [phone]   Caller: Patient Summary of Call: Patient called regarding problem with joints.  Also left leg is swelling.   Initial call taken by: Leonette Monarch,  January 24, 2011 1:31 PM  Follow-up for Phone Call        Pt states she has had increased pain in her legs and hips. She denied any significant swelling. She says she has not been using the mobic and was instructed to try usin this daily for a few days to see if the pain would improve. She will callback if this does not help or her sxs get worse. Follow-up by: Michel Bickers CMA,  January 24, 2011 2:56 PM

## 2011-02-01 ENCOUNTER — Telehealth: Payer: Self-pay | Admitting: Pulmonary Disease

## 2011-02-06 NOTE — Progress Notes (Signed)
Summary: [facility] went to see patient today needs verbal orders  Phone Note From Other Clinic   Caller: Feliberto Harts [facility] Call For:  Chivas Notz Summary of Call: Feliberto Harts with [facility] went to see the patient today stated that she is an amazing 77 year old. Patient stated that she needs help with her medications she is getting a little over whelmed. She wants a nurse to come and help with the medical management of both her and her husband.  they are both on alot of meds and the patient wants a pill box for both her and her husband Auriel Kist and wants a nurse to teach them how to use it. Also Herbert Seta states that Mr Cullen has fallen several times and she thinks that he needs PT more than Ms Sonntag. Herbert Seta can take orders either by fax or verbal. she is trying to get her a walker with a seat. Fax number is [phone] and the phone number is [phone]  Initial call taken by: Vedia Coffer,  January 31, 2011 3:22 PM  Follow-up for Phone Call        lmomtcb for heather to give verbal orders for pt Randell Loop [facility]  January 31, 2011 4:22 PM   Verbal orders for nursing and PT given to [facility] for pt Nanine Means. See pt husband chart for orders for him Aurora Mask. Carron Curie CMA  January 31, 2011 4:37 PM

## 2011-02-06 NOTE — Progress Notes (Signed)
Summary: physical therapy  Phone Note From Other Clinic   Caller: sara w/ [facility] Call For: nadel Summary of Call: caller states that [facility] will not be able to take pt on (re: physical therapy) as a client. Huntley Dec csr # [phone] Initial call taken by: Tivis Ringer, CNA,  January 29, 2011 10:52 AM  Follow-up for Phone Call        Per Huntley Dec at [facility].  They do not have enought staff to take this case right now.  Pt has used [facility] in the past but pt doesn't want to use them again.  instrcuted pt that we would send order to [facility] for PT eval. Abigail Miyamoto RN  January 29, 2011 11:47 AM

## 2011-02-06 NOTE — Progress Notes (Signed)
Summary: joint pain  Phone Note Call from Patient Call back at Home Phone [phone]   Caller: Patient Call For: Trinh Sanjose Summary of Call: pt was told to call back if joint pain was not any better. (see msg 01/24/11).  Initial call taken by: Tivis Ringer, CNA,  January 28, 2011 11:33 AM  Follow-up for Phone Call        Spoke with t and she staets he hip and leg pain is omproved with mobic, but she is still having trouble walking and is requesting an prder be placed to [facility] for physical therapy to help her with walking. Please advise.Carron Curie CMA  January 28, 2011 3:41 PM   Additional Follow-up for Phone Call Additional follow up Details #1::        called and spoke with pt and she is aware per SN ok for the PT to come to her home and help her with her walking.  order has been placed and pt is aware that they will contact her prior to sending someone out. Randell Loop [facility]  January 28, 2011 4:26 PM

## 2011-02-06 NOTE — Progress Notes (Signed)
Summary: [facility] needs verbal order for 4 wheeled walker with seat and basket  Phone Note From Other Clinic   Caller: TONYA WITH [facility] Call For: Taniaya Rudder Summary of Call: Tonya with [facility] phoned stated that she needed a verbal order for a four wheeled walker with seat and basket. Archie Patten can be reached at [phone] ext [phone] Initial call taken by: Vedia Coffer,  February 01, 2011 2:50 PM  Follow-up for Phone Call        called and spoke with tonya at [facility] order given for pt to have a 4 wheeled walker with a seat and basket.  Archie Patten will call back for any other questions or concerns Randell Loop [facility]  February 01, 2011 3:32 PM

## 2011-02-19 ENCOUNTER — Encounter: Payer: Self-pay | Admitting: Pulmonary Disease

## 2011-02-27 ENCOUNTER — Telehealth (INDEPENDENT_AMBULATORY_CARE_PROVIDER_SITE_OTHER): Payer: Self-pay | Admitting: *Deleted

## 2011-03-05 NOTE — Miscellaneous (Signed)
Summary: Order/[facility]  Order/[facility]   Imported By: Sherian Rein 02/26/2011 07:47:34  _____________________________________________________________________  External Attachment:    Type:   Image     Comment:   External Document

## 2011-03-05 NOTE — Progress Notes (Signed)
Summary: pt wants to continue in home with [facility]  Phone Note From Other Clinic   Caller: HEATHER SMITH/[facility] Call For: NADEL Summary of Call: heatherwith [facility] phoned she is at [facility] office to discharge her but the patient isnt happy with this she wants to continue for a few more weeks. Herbert Seta stated that she still has some weakness in her knees and lower legs and has balance issues. Herbert Seta can be reached at [phone] She needs orders if it is ok to continue Initial call taken by: Vedia Coffer,  February 27, 2011 1:23 PM  Follow-up for Phone Call        lmomtcb x1 for heather to call back Carver Fila  February 27, 2011 3:00 PM   Heather calling back. Lehman Prom  February 27, 2011 3:35 PM  [facility] x1 for [facility].Michel Bickers [facility]  February 27, 2011 4:13 PM  Additional Follow-up for Phone Call Additional follow up Details #1::        Spoke with Herbert Seta and she states pt does not want to d/c the physical therapy and wants to continue for a few more weeks. heather states pt is doing real well with the physcial therapy but it still having some ongoing balance problem, and some leg weakness.  Dr. Kriste Basque please advise if you are okay with pt conitnue PT.  Carver Fila  February 27, 2011 4:54 PM     Additional Follow-up for Phone Call Additional follow up Details #2::    ok to cont the PT for pt at home with [facility] x 4  weeks.  thanks Randell Loop CMA  February 27, 2011 4:59 PM   Called and spoke with Herbert Seta and informed her it was okay to extend PT x 4 weeks. She verbalized understanding Carver Fila  February 27, 2011 5:02 PM

## 2011-03-24 LAB — LIPID PANEL
HDL: 107 mg/dL (ref >39)
Total CHOL/HDL Ratio: 1.9 RATIO
Triglycerides: 50 mg/dL (ref <150)
VLDL: 10 mg/dL (ref 0–40)

## 2011-03-24 LAB — BASIC METABOLIC PANEL
BUN: 13 mg/dL (ref 6–23)
CO2: 31 mEq/L (ref 19–32)
Chloride: 105 mEq/L (ref 96–112)
Potassium: 4 mEq/L (ref 3.5–5.1)

## 2011-03-24 LAB — BRAIN NATRIURETIC PEPTIDE: Pro B Natriuretic peptide (BNP): 95 pg/mL (ref 0.0–100.0)

## 2011-03-24 LAB — CARDIAC PANEL(CRET KIN+CKTOT+MB+TROPI)
CK, MB: 4 ng/mL (ref 0.3–4.0)
Troponin I: 0.03 ng/mL (ref 0.00–0.06)

## 2011-03-24 LAB — POCT CARDIAC MARKERS: Troponin i, poc: <0.05 ng/mL (ref 0.00–0.09)

## 2011-03-24 LAB — CBC
Platelets: 185 10*3/uL (ref 150–400)
WBC: 5.3 10*3/uL (ref 4.0–10.5)

## 2011-03-24 LAB — TSH: TSH: 2.43 u[IU]/mL (ref 0.350–4.500)

## 2011-03-26 ENCOUNTER — Encounter: Payer: Self-pay | Admitting: Pulmonary Disease

## 2011-03-27 ENCOUNTER — Ambulatory Visit (INDEPENDENT_AMBULATORY_CARE_PROVIDER_SITE_OTHER): Payer: Medicare Other | Admitting: Pulmonary Disease

## 2011-03-27 ENCOUNTER — Encounter: Payer: Self-pay | Admitting: Pulmonary Disease

## 2011-03-27 DIAGNOSIS — M810 Age-related osteoporosis without current pathological fracture: Secondary | ICD-10-CM

## 2011-03-27 DIAGNOSIS — M1990 Unspecified osteoarthritis, unspecified site: Secondary | ICD-10-CM

## 2011-03-27 DIAGNOSIS — R32 Unspecified urinary incontinence: Secondary | ICD-10-CM

## 2011-03-27 DIAGNOSIS — I872 Venous insufficiency (chronic) (peripheral): Secondary | ICD-10-CM

## 2011-03-27 DIAGNOSIS — E7800 Pure hypercholesterolemia, unspecified: Secondary | ICD-10-CM

## 2011-03-27 DIAGNOSIS — F0390 Unspecified dementia without behavioral disturbance: Secondary | ICD-10-CM

## 2011-03-27 DIAGNOSIS — I10 Essential (primary) hypertension: Secondary | ICD-10-CM

## 2011-03-27 DIAGNOSIS — J209 Acute bronchitis, unspecified: Secondary | ICD-10-CM

## 2011-03-27 NOTE — Progress Notes (Signed)
Subjective:    Patient ID: Jessica [name], female    DOB: 06-03-1911, 77 y.o.   MRN: [id number]  HPI 77 y/o BF here for a follow up visit...  She has mult medical problems including:  AR & runny nose;  Asthmatic Bronchitis;  HBP;  Chronic VI & edema;  Hyperchol;  GERD & esoph stricture;  Divertics;  Hx Urinary incontinence & UTIs;  DJD/ Osteoporosis;  Semile Dementia;  Anxiety...  ~  Oct11:  she is upset that she had to wait 60mo for f/u appt- wants them Q62mo... she turned 77 y/o yest... she has list of complaints:  feet burning> known neuropathy but not bad enough for new med yet;  weak back> we discussed back brace Prn;  ?do I have blood clots/> no;  eyes burning, nose running, mult complaints about husb> all discussed...  breathing is stable;  no CP, palpit, ch in SOB;  BP controlled on meds;  otherw stable & she will ret 21mo fasting for labs.  ~  December 26, 2010:  clinically stable- she persists w/ mult complaints:  hips> offered Ortho eval- "it's not that bad", has Mobic for Prn use;  runny nose: chr complaint, ?doesn't remember our numerous prev conversations- rec OTC Zyrtek;  "Jessica [name] needs his nerve pill"> Alpraz 0.25mg  written;  Jessica [name] wants labs done (ate strawberries & milk this AM)...  ~  March 27, 2011:  21mo ROV & she's glad to be here because "things aren't doing to good"> c/o knees weak, knee pain (Mobic helps), noct leg discomfrt (Mobic helps this too), bladder trouble & she wants to see Jessica [name] again as Jessica [name] hasn't been able to help her;  Wants to take Fish OPil- OK;  We reviewed her meds & her full labs from 1/12 w/ HDL= 151!!!       Problem List:  Hx of ALLERGIES (ICD-995.3) - she uses OTC meds as needed, & we prev discussed Zyrtek 10mg Qam, Saline nasal mist, & ATROVENT Nasal 0.03% Tid for drainage (one of her chronic complaints)...  Hx of ASTHMATIC BRONCHITIS, ACUTE (ICD-466.0) - no recent exacerbations...  HYPERTENSION (ICD-401.9) - controlled on LISINOPRIL 20mg - 1 daily &  FUROSEMIDE 20mg - 1-2 each AM... ~  4/12:  BP= 110/60 and she states that she is taking her meds regularly & tolerating them well... denies HA, visual changes, CP, palpit, change in dyspnea, etc... but notes weakness, intermittently dizzy & persist edema in her feet...   VENOUS INSUFFICIENCY, CHRONIC (ICD-459.81) - there is mild pedal edema bilat L>R... on low sodium diet, elevation, support hose- prev refused diuretic meds due to urinary symptoms, now on LASIX 20mg - 1-2 Qam.  HYPERCHOLESTEROLEMIA (ICD-272.0) - prev on Lip10, but she stopped this on her own... she has a very high HDL!!! ~  FLP was 1/08 showed TChol 253, TG 125, HDL 121, LDL 93 ~  FLP 2/09 showed TChol 200, TG 99, HDL 136, LDL 44 ~  FLP 4/11 showed TChol 244, TG 143, HDL 149, LDL 59 ~  FLP 1/12 showed TChol 266, TG 102, HDL 151, LDL 83 >> BEST HDL I'VE EVER SEEN  GERD (ZHY-865.78) - last EGD was 9/05 by Jessica [name] showing GERD and esoph stricture- dilated... she stopped her Nexium but states that her swallowing is good & she uses PRILOSEC as needed.  DIVERTICULOSIS OF COLON (ICD-562.10) - last colonoscopy was 11/00 showing divertics only...  HX, URINARY INFECTION (ICD-V13.02) & INCONTINENCE symptoms... she has seen Jessica [name] & staff on bladder exercises and she reported some improvement but  has persistant symptoms & never followed up... ~  7/10:  offered f/u appt w/ Urology vs second opinion consult but she declines... ~  9/10:  wrote for trial Enablex 7.5mg /d >> no benefit she says. ~  12/10: saw Jessica [name]- tried sample med, sl improved, but "he turned me over to a lady for longer term treatments" she says...  DEGENERATIVE JOINT DISEASE (ICD-715.90) - notes right shoulder symptoms, but managing OK w/ Tylenol; also right hip & lo0w back pain...  she also c/o neuropathic "burning" in legs but not bad enough for additional meds she says...  OSTEOPOROSIS (ICD-733.00) - she was on Fosamax but had discontinued this on her own... we  discussed the need for continued therapy and she was asked to restart Alendronate but she never did...  she takes Ca++, MVI, VitD...  SENILE DEMENTIA (ICD-290.0) - she takes ASA 81mg /d... she tried Aricept in the past but didn't notice any improvement therefore stopped it. ~  she & her husb still live on their own but are having numerous difficulties while still resisting any thoughts of assisted living etc> got concerned because she saw an impulse in her left antecubital fossa (tortuous brachial art), went to the ER & this got translated to palpitations & lead to full ER cardiac eval= neg... ~  6/11: MMSE showed 26/30 & she agreed to try DONEPEZIL 10mg /d...  ANXIETY (ICD-300.00) - she uses Alpraz 0.25mg  as needed.  Hx of SHINGLES (ICD-053.9)   Past Surgical History  Procedure Date  . Vesicovaginal fistula closure w/ tah   . Tonsillectomy     Outpatient Encounter Prescriptions as of 03/27/2011  Medication Sig Dispense Refill  . ALPRAZolam (XANAX) 0.25 MG tablet 1/2 -1 tablet by mouth three times a day as needed for nerves       . amLODipine (NORVASC) 5 MG tablet Take 5 mg by mouth daily.        Marland Kitchen aspirin 81 MG tablet Take 81 mg by mouth daily.        . Calcium Carbonate-Vitamin D 600-400 MG-UNIT per tablet Take 1 tablet by mouth 2 (two) times daily.        . Cholecalciferol (VITAMIN D) 1000 UNITS capsule Take 1,000 Units by mouth daily.        Marland Kitchen donepezil (ARICEPT) 10 MG tablet Take 10 mg by mouth daily.        . furosemide (LASIX) 20 MG tablet Take 40 mg by mouth every morning. For swelling       . lisinopril (PRINIVIL,ZESTRIL) 20 MG tablet Take 20 mg by mouth daily.        . meloxicam (MOBIC) 7.5 MG tablet 1 by mouth once daily w/ food as needed for joint/back pain       . Multiple Vitamin (MULTIVITAMIN) tablet Take 1 tablet by mouth daily.          No Known Allergies   Review of Systems        See HPI - all other systems neg except as noted...      The patient complains of  decreased hearing, dyspnea on exertion, peripheral edema, incontinence, muscle weakness, and difficulty walking.  The patient denies anorexia, fever, weight loss, weight gain, vision loss, hoarseness, chest pain, syncope, prolonged cough, headaches, hemoptysis, abdominal pain, melena, hematochezia, severe indigestion/heartburn, hematuria, suspicious skin lesions, transient blindness, depression, unusual weight change, abnormal bleeding, enlarged lymph nodes, and angioedema.     Objective:   Physical Exam      WD, WN,  77 y/o BF in NAD... GENERAL:  Alert, pleasant & cooperative; she is slow moving as noted... HEENT:  [place]/AT, EOM-full, EACs- some wax, TMs-wnl, NOSE-clear, THROAT-clear & wnl. NECK:  Supple w/ fairROM; no JVD; normal carotid impulses w/o bruits; no thyromegaly or nodules palpated; no lymphadenopathy. CHEST:  Clear to P & A; without wheezes/ rales/ or rhonchi., noted kyphosis... HEART:  Regular Rhythm; without murmurs/ rubs/ or gallops heard... ABDOMEN:  Soft & nontender; normal bowel sounds; no organomegaly or masses detected. EXT: without deformities, mod arthritic changes; no varicose veins/ +venous insuffic/ 1-2+edema... NEURO:   Alert, answers questions appropriately, no focal neuro deficits... DERM:  No lesions noted; no rash etc...  MMSE> 06/13/10> Score 26/30 (missed place, 2/3 recall after distraction, & copy figure)...  REVIEW OF WEIGHTS: Jun09 to Jun10 - weights varied betw 123-129# Jun10 weight= 123# w/ 1+ edema reported. Jul10 weight= 117# w/ 1+ edema present. Sep10 weight= 122# w/ mod pedal edema. Jan11 weight = 119# w/ min edema. Jun11 weight = 123# w/ persist edema. MVH84 weight = 118# w/ min swelling Apr12 weight = 114#... rec to add nutritional supplement daily.  Assessment & Plan:   AR & ASTHMA>  Breathing is stable, at baseline, she has chr rhinorrhea complaints...  HBP>  Controlled on meds, continue same...  Ven insuffic>  Reminded of sodium  restriction etc...  CHOL>  She has the world's best HDL!!!  GU>  She wants another appt w/ Jessica [name] regarding her incont...  DJD/ Osteopenia>  Aware, she is stable on OTC meds, she has repeatedly refuse Bisphos meds for her bones...  Dementia>  She & husb Jessica [name] are still living independently...  Other medical problems as noted.Marland KitchenMarland Kitchen

## 2011-03-27 NOTE — Patient Instructions (Addendum)
Today we updated your med list in the EPIC system...    Continue your current meds the same...  We reviewed your previous lab data....  I recommend starting on a nutritional supplement like ENSURE, SUSTACAL, BOOST> And drink 1-2 cans every day...  We will arrange an appt w/ DrTannenbaum at the Urology office to check your incontinence problem...  Let's plan a follow up visit in 3 months.Marland KitchenMarland Kitchen

## 2011-03-31 ENCOUNTER — Encounter: Payer: Self-pay | Admitting: Pulmonary Disease

## 2011-04-03 ENCOUNTER — Other Ambulatory Visit: Payer: Self-pay | Admitting: *Deleted

## 2011-04-03 MED ORDER — ALPRAZOLAM 0.25 MG PO TABS: ORAL_TABLET | ORAL | Status: DC

## 2011-04-03 NOTE — Telephone Encounter (Signed)
Refills sent to the pharmacy with 5 refills of the alprazolam

## 2011-04-30 NOTE — H&P (Signed)
NAMEAMIREE, NO               ACCOUNT NO.:  [id number]   MEDICAL RECORD NO.:  [id number]          PATIENT TYPE:  INP   LOCATION:  4704                         FACILITY:  [facility]   PHYSICIAN:  Veverly Fells. Excell Seltzer, MD  DATE OF BIRTH:  1911/10/13   DATE OF ADMISSION:  07/28/2009  DATE OF DISCHARGE:                              HISTORY & PHYSICAL   PRIMARY CARE PHYSICIAN:  Lonzo Cloud. Kriste Basque, MD   PRIMARY CARDIOLOGIST:  None.   CHIEF COMPLAINT:  Shortness of breath.   HISTORY OF PRESENT ILLNESS:  Ms. Harral is a 77 year old female with no  history of coronary artery disease.  She had onset of throbbing, which  she said she could both see and feel in both her antecubital areas.  She  said it started on the left and then she also noticed it on the right as  well.  The throbbing was rapid.  It was intermittent.  It had been going  on 3 days.  She went to [facility] Emergency Room and an EKG was  concerning for an ST elevation MI.  She was transported urgently to  [facility] ER for evaluation.   In the [facility] ER, Ms. Mcginty is currently resting comfortably.  She  is denying chest pain or shortness of breath.  She has had intermittent  shortness of breath prior to admission.  She is not sure of the episodes  of shortness of breath and a throbbing occurred at the same time.  She  has no history of chest pain.  She has not had any presyncope or  syncope.  She states that she is active around the house with house  working and gardening, and denies any ischemic symptoms.  She still  drives.   PAST MEDICAL HISTORY:  1. Hypertension.  2. Osteoporosis.  3. Degenerative joint disease.  4. Reported history of dementia.  5. History of asthmatic bronchitis.  6. Chronic venous insufficiency.  7. Hypercholesterolemia.  8. Gastroesophageal reflux disease.  9. Diverticulosis.  10.Remote history of shingles.   SURGICAL HISTORY:  None noted.   ALLERGIES:  No known drug allergies.   CURRENT MEDICATIONS:  1. Aspirin 81 mg daily.  2. Norvasc 5 mg one-half tablet daily.  3. Lisinopril 20 mg one-half tablet daily.  4. Lasix 20 mg daily.  5. Fosamax every week.  6. Caltrate b.i.d.  7. Multivitamin daily.  8. Vitamin D daily.  9. Aricept 10 mg daily  10.Xanax p.r.n.   SOCIAL HISTORY:  She lives in [place] with her husband.  They have  been married almost 60 years.  She is a retired Runner, broadcasting/film/video.  She has no  history of alcohol, tobacco, or drug abuse.   FAMILY HISTORY:  Both of her parents are deceased and her father had a  stroke, but neither one had heart disease.  She has one brother who died  at age 74 of pneumonia.   REVIEW OF SYSTEMS:  She has occasional arthralgias.  She denies melena,  reflux symptoms, or abdominal pain.  She occasionally gets some daytime  edema.  She has chronic  dyspnea on exertion and does not think it has  worsened recently, although she has had problems with the heat.  Full 14-  point review of systems is negative other than in the HPI.   PHYSICAL EXAMINATION:  VITAL SIGNS:  Temperature is 98.0, blood pressure  162/82, pulse 62, respiratory rate 22, and O2 saturation 100% on 2L.  GENERAL:  She is a slender elderly African American female, in no acute  distress.  HEENT:  Essentially normal, but her dentition is somewhat poor.  NECK:  There is no lymphadenopathy, thyromegaly, or significant JVD  noted.  She does have a left carotid bruit.  This is possibly radiation  of murmur.  CVA:  Heart is regular in rate and rhythm with an S1-S2, and a 2 or 3/6  systolic murmur is noted at the left upper sternal border.  Distal  pulses are intact in all 4 extremities.  LUNGS:  Essentially clear to clear to auscultation bilaterally.  SKIN:  No rashes or lesions are noted.  ABDOMEN:  Soft and nontender with active bowel sounds.  EXTREMITIES:  There is minimal pedal edema, but no cyanosis or clubbing.  MUSCULOSKELETAL:  There is no joint deformity  or effusion, and no spine  or CVA tenderness.  NEUROLOGIC:  She is alert and oriented.  Cranial nerves II through XII  grossly intact.   Chest x-ray is pending.   LABORATORY VALUES:  Sodium 140, potassium 4.0, chloride 105, CO2 of 31,  BUN 13, creatinine 0.88, glucose 88, and calcium 10.9.  Point of care  markers negative x1.   EKG shows sinus rhythm rate.   IMPRESSION:  Sinus bradycardia rate 55 with a right bundle-branch block,  which was present on a previous EKG.  She has a possible anteroseptal  infarct because of Q-waves in V1 and V2 and there is a question of some  ST elevation in V2.   Ms. Severa was seen today by Dr. Excell Seltzer.  She is a 77 year old female  who went to [facility] with a pulsation feeling in both arms, but no  chest pain.  Her EKG showed a right bundle-branch block with  indeterminate ST changes.  A code STEMI was called and she was  transferred to [facility].  She has no pain or symptoms at present.  She  had been referred to Cardiology by Dr. Kriste Basque for intermittent tachy  palpitations.  A bedside echo was done and preliminary images reviewed  by Dr. Excell Seltzer showed normal systolic function and no regional wall  motion abnormalities, although she does have LVH.   PLAN:  She will get a complete 2-D echocardiogram.  She will be admitted  to telemetry and monitored closely.  We will cycle serial cardiac  enzymes and check a BNP.  If these hospital studies are unrevealing, an  outpatient event recorder is appropriate and she can be discharged  tomorrow.  The code STEMI was cancelled.  She will be continued on her  home medications.      Theodore Demark, PA-C      Veverly Fells. Excell Seltzer, MD  Electronically Signed    RB/MEDQ  D:  07/28/2009  T:  07/29/2009  Job:  [id number]

## 2011-04-30 NOTE — Discharge Summary (Signed)
Jessica [name], Jessica [name]               ACCOUNT NO.:  [id number]   MEDICAL RECORD NO.:  [id number]          PATIENT TYPE:  INP   LOCATION:  4704                         FACILITY:  [facility]   PHYSICIAN:  Madolyn Frieze. Jens Som, MD, FACCDATE OF BIRTH:  04/01/1911   DATE OF ADMISSION:  07/28/2009  DATE OF DISCHARGE:  07/29/2009                               DISCHARGE SUMMARY   PRIMARY CARE PHYSICIAN:  Lonzo Cloud. Kriste Basque, MD   CARDIOLOGIST:  New, will be Veverly Fells. Excell Seltzer, MD   DISCHARGE DIAGNOSES:  1. Palpitations.  2. Hypertension.  3. Gastroesophageal reflux disease.  4. Asthmatic bronchitis.   This is a 77 year old [place] American female with no history of  coronary artery disease, sudden onset of throbbing visible in left chest  and right IC space going on 3 days, went to [employer] [facility].  EKG concerning  for STEMI.  The patient transferred to [facility] for further  evaluation.  In the ER the patient without complaints of chest  discomfort and shortness of breath.  The patient states she never had  any chest discomfort.  She continues to remain active, enjoys her house  work and gardening.  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  The patient transferred to [facility].  EKG abnormal, but  not indicative of a STEMI.  EKG showed right bundle-branch block with  indeterminate ST changes.  The patient was ruled out for myocardial  infarction by serial cardiac markers.  Echocardiogram; normal LV  function, mild AI, MR, and bilateral biatrial enlargement.  The patient  is stable to be discharged home to follow up outpatient with a Card[employer]  monitor per Dr. Excell Seltzer and then follow up with Dr. Excell Seltzer in 4-6 weeks.   MEDICATIONS AT TIME OF DISCHARGE:  1. Amlodipine 2.5 mg daily.  2. Aspirin 81 mg daily.  3. Furosemide 10 mg daily.  4. Lisinopril 10 mg daily.  5. Xanax 0.25 mg t.i.d. p.r.n.  6. Aricept 10 mg daily.  7. Calcium and vitamin D daily.   DURATION OF DISCHARGE ENCOUNTER:   Less than 30 minutes.   FOLLOWUP:  Office will call the patient with date and time for followup.      Dorian Pod, ACNP      Madolyn Frieze. Jens Som, MD, [facility]  Electronically Signed    MB/MEDQ  D:  07/29/2009  T:  07/29/2009  Job:  [id number]

## 2011-05-03 NOTE — Assessment & Plan Note (Signed)
[facility]                             PULMONARY OFFICE NOTE   Jessica [name], Jessica [name]                        MRN:          [id number]  DATE:12/18/2006                            DOB:          03/31/1911    HISTORY OF PRESENT ILLNESS:  This is a very pleasant 77 year old African-  American female patient of Dr. Kriste Basque, who has a known history of  hypertension, asthmatic bronchitis and hyperlipidemia, presents for an  acute office visit.  The patient complains, over the last two weeks, she  has had intermittent lower back pains bilaterally and some increased  urinary urgency and nocturia.  The patient had similar symptoms two  months ago and had an unremarkable urinalysis.  The patient denies any  dysuria, discharge, abdominal pain, nausea or vomiting, hematuria,  polydipsia.  Symptoms seem to be worsened if the patient is moving,  bending or walking, at times.  The patient has not used any medications  for treatment.   PAST MEDICAL HISTORY:  Reviewed.   CURRENT MEDICATIONS:  Reviewed.   PHYSICAL EXAMINATION:  The patient is an elderly female, in no acute  distress.  She is afebrile with stable vital signs.  Her O2 saturation  is 98% on room air.  HEENT:  Unremarkable.  NECK:  Supple, without adenopathy.  LUNGS:  Her lung sounds are clear.  CARDIAC:  Regular rate.  ABDOMEN:  Soft, without any hepatosplenomegaly.  No guarding or rebound  noted.  No abdominal bruits or masses appreciated.  Negative CVA  tenderness.  EXTREMITIES:  Warm without any calf cyanosis or clubbing.  Equal  strength of the upper and lower extremities.  Negative straight leg  raises.  The patient does have some tenderness in the lumbosacral region  bilaterally.  No ecchymoses or deformity are noted.   IMPRESSION AND PLAN:  Lower back pain with some mild nocturia.  Urinalysis and culture are pending at time of dictation.  The patient  will increase fluid intake.  May use Tylenol  Arthritis as needed.  Alternate ice and heat.  The patient is to return as scheduled or sooner  if needed.      Rubye Oaks, NP  Electronically Signed      Lonzo Cloud. Kriste Basque, MD  Electronically Signed   TP/MedQ  DD: 12/19/2006  DT: 12/19/2006  Job #: [id number]

## 2011-05-24 ENCOUNTER — Telehealth: Payer: Self-pay | Admitting: Pulmonary Disease

## 2011-05-24 MED ORDER — AMLODIPINE BESYLATE 5 MG PO TABS: 5.0000 mg | ORAL_TABLET | Freq: Every day | ORAL | Status: DC

## 2011-05-24 NOTE — Telephone Encounter (Signed)
Spoke with pt and notified that rx was sent.  Pt verbalized understanding.

## 2011-05-30 ENCOUNTER — Ambulatory Visit (INDEPENDENT_AMBULATORY_CARE_PROVIDER_SITE_OTHER): Payer: Medicare Other | Admitting: Pulmonary Disease

## 2011-05-30 DIAGNOSIS — I10 Essential (primary) hypertension: Secondary | ICD-10-CM

## 2011-05-30 DIAGNOSIS — G589 Mononeuropathy, unspecified: Secondary | ICD-10-CM

## 2011-05-30 DIAGNOSIS — R5381 Other malaise: Secondary | ICD-10-CM

## 2011-05-30 DIAGNOSIS — F0390 Unspecified dementia without behavioral disturbance: Secondary | ICD-10-CM

## 2011-05-30 DIAGNOSIS — M810 Age-related osteoporosis without current pathological fracture: Secondary | ICD-10-CM

## 2011-05-30 DIAGNOSIS — R5383 Other fatigue: Secondary | ICD-10-CM

## 2011-05-30 DIAGNOSIS — R609 Edema, unspecified: Secondary | ICD-10-CM

## 2011-05-30 DIAGNOSIS — R350 Frequency of micturition: Secondary | ICD-10-CM

## 2011-05-30 DIAGNOSIS — R32 Unspecified urinary incontinence: Secondary | ICD-10-CM

## 2011-05-30 DIAGNOSIS — I872 Venous insufficiency (chronic) (peripheral): Secondary | ICD-10-CM

## 2011-05-30 DIAGNOSIS — F411 Generalized anxiety disorder: Secondary | ICD-10-CM

## 2011-05-30 DIAGNOSIS — M1990 Unspecified osteoarthritis, unspecified site: Secondary | ICD-10-CM

## 2011-05-30 NOTE — Patient Instructions (Signed)
Today we updated your med list in EPIC...    Remember to increase your Lasix/ Furosemide fluid pill to 1 or 2 tabs each AM for the swelling...    Remember to eliminate salt & sodium from your diet> you may use SALT SUBSTITUTE if you want to...  We will arrange for a Urology appt w/ DrTannenbaum about your bladder control issues...  We will request for [facility] to start Balance training at home to help w/ your balance...  Call for ant questions...  Let's plan another follow up appt in 2 months.Jessica KitchenMarland [name]

## 2011-05-30 NOTE — Progress Notes (Signed)
Subjective:    Patient ID: Jessica [name], female    DOB: 03-11-1911, 77 y.o.   MRN: [id number]  HPI 77 y/o BF here for a follow up visit...  She has mult medical problems including:  AR & runny nose;  Asthmatic Bronchitis;  HBP;  Chronic VI & edema;  Hyperchol;  GERD & esoph stricture;  Divertics;  Hx Urinary incontinence & UTIs;  DJD/ Osteoporosis;  Semile Dementia;  Anxiety...  ~  Oct11:  she is upset that she had to wait 50mo for f/u appt- wants them Q48mo... she turned 77 y/o yest... she has list of complaints:  feet burning> known neuropathy but not bad enough for new med yet;  weak back> we discussed back brace Prn;  ?do I have blood clots/> no;  eyes burning, nose running, mult complaints about husb> all discussed...  breathing is stable;  no CP, palpit, ch in SOB;  BP controlled on meds;  otherw stable & she will ret 6627mo fasting for labs.  ~  December 26, 2010:  6627mo ROV & clinically stable but she persists w/ mult somatic complaints:  hips> offered Ortho eval- "it's not that bad", has Mobic for Prn use;  runny nose: chr complaint, ?doesn't remember our numerous prev conversations- rec OTC Zyrtek;  "[name] needs his nerve pill"> Alpraz 0.25mg  written;  Aubrianna wants labs done (ate strawberries & milk this AM)> routine labs all wnl, TChol is elev but HDL is 151!  ~  March 27, 2011:  6627mo ROV & she's glad to be here because "things aren't doing to good"> c/o knees weak, knee pain (Mobic helps), noct leg discomfrt (Mobic helps this too), bladder trouble & she wants to see DrTannenbaum again as DrNesi hasn't been able to help her;  Wants to take Fish Oil- OK;  We reviewed her meds & her full labs from 1/12 w/ HDL= 151!!!  ~  May 30, 2011:  27mo ROV & she states "Oh, not doing so well"> c/o allergy & runny nose; feet & legs are burning & stinging (reviewed neuropathy w/ her & offered med but she declines meds at this time); wants more PT & balance training from the same [facility] therapist who comes to the house  now for regular PT; wants power chair & I wrote Rx for [facility] to start the process; never went to see DrTannenbaum after last OV & she wants to resched this appt; finally she notes that Q78mo appts aren't satisfactory for her & she wants to be seen every 27months now...       Problem List:  Hx of ALLERGIES (ICD-995.3) - she uses OTC meds as needed, & we prev discussed Zyrtek 10mg Qam, Saline nasal mist, & ATROVENT Nasal 0.03% Tid for drainage (one of her chronic complaints)...  Hx of ASTHMATIC BRONCHITIS, ACUTE (ICD-466.0) - no recent exacerbations...  HYPERTENSION (ICD-401.9) - controlled on LISINOPRIL 20mg - 1 daily & FUROSEMIDE 20mg - 1-2 each AM... ~  4/12:  BP= 110/60 and she states that she is taking her meds regularly & tolerating them well... denies HA, visual changes, CP, palpit, change in dyspnea, etc... but notes weakness, intermittently dizzy & persist edema in her feet...  ~  6/12:  BP= 124/70 & she remains stable...  VENOUS INSUFFICIENCY, CHRONIC (ICD-459.81) - there is mild pedal edema bilat L>R... on low sodium diet, elevation, support hose- prev refused diuretic meds due to urinary symptoms, now on LASIX 20mg - 1-2 Qam.  HYPERCHOLESTEROLEMIA (ICD-272.0) - prev on Lip10, but she stopped this on  her own... she has a very high HDL!!! ~  FLP was 1/08 showed TChol 253, TG 125, HDL 121, LDL 93 ~  FLP 2/09 showed TChol 200, TG 99, HDL 136, LDL 44 ~  FLP 4/11 showed TChol 244, TG 143, HDL 149, LDL 59 ~  FLP 1/12 showed TChol 266, TG 102, HDL 151, LDL 83 >> BEST HDL I'VE EVER SEEN  GERD (AVW-098.11) - last EGD was 9/05 by DrPerry showing GERD and esoph stricture- dilated... she stopped her Nexium but states that her swallowing is good & she uses PRILOSEC as needed.  DIVERTICULOSIS OF COLON (ICD-562.10) - last colonoscopy was 11/00 showing divertics only...  HX, URINARY INFECTION (ICD-V13.02) & INCONTINENCE symptoms... she has seen DrTannenbaum & staff on bladder exercises and she reported some  improvement but has persistant symptoms & never followed up... ~  7/10:  offered f/u appt w/ Urology vs second opinion consult but she declines... ~  9/10:  wrote for trial Enablex 7.5mg /d >> no benefit she says. ~  12/10: saw DrNesi- tried sample med, sl improved, but "he turned me over to a lady for longer term treatments" she says... ~  4/12 & 6/12: states DrNesi hasn't been able to help her & she wants appt w/ DrTannenbaum.  DEGENERATIVE JOINT DISEASE (ICD-715.90) - notes right shoulder symptoms, but managing OK w/ MOBIC 7.5mg  & Tylenol; also right hip & low back pain...  she also c/o neuropathic "burning" in legs but not bad enough for additional meds she says...  OSTEOPOROSIS (ICD-733.00) - she was on Fosamax but had discontinued this on her own... we discussed the need for continued therapy and she was asked to restart Alendronate but she never did...  she takes Ca++, MVI, VitD...  SENILE DEMENTIA (ICD-290.0) - she takes ASA 81mg /d... she tried Aricept in the past but didn't notice any improvement therefore stopped it. ~  she & her husb still live on their own but are having numerous difficulties while still resisting any thoughts of assisted living etc> got concerned because she saw an impulse in her left antecubital fossa (tortuous brachial art), went to the ER & this got translated to palpitations & lead to full ER cardiac eval= neg... ~  6/11: MMSE showed 26/30 & she agreed to try DONEPEZIL 10mg /d... ~  6/12:  Still taking Aricept & appears stable...  ANXIETY (ICD-300.00) - she uses Alpraz 0.25mg  as needed.  Hx of SHINGLES (ICD-053.9)   Past Surgical History  Procedure Date  . Vesicovaginal fistula closure w/ tah   . Tonsillectomy     Outpatient Encounter Prescriptions as of 05/30/2011  Medication Sig Dispense Refill  . ALPRAZolam (XANAX) 0.25 MG tablet 1/2 -1 tablet by mouth three times a day as needed for nerves  90 tablet  5  . amLODipine (NORVASC) 5 MG tablet Take 1 tablet  (5 mg total) by mouth daily.  90 tablet  3  . aspirin 81 MG tablet Take 81 mg by mouth daily.        . Calcium Carbonate-Vitamin D 600-400 MG-UNIT per tablet Take 1 tablet by mouth 2 (two) times daily.        . Cholecalciferol (VITAMIN D) 1000 UNITS capsule Take 1,000 Units by mouth daily.        Marland Kitchen donepezil (ARICEPT) 10 MG tablet Take 10 mg by mouth daily.        . furosemide (LASIX) 20 MG tablet Take 40 mg by mouth every morning. For swelling       .  lisinopril (PRINIVIL,ZESTRIL) 20 MG tablet Take 20 mg by mouth daily.        . meloxicam (MOBIC) 7.5 MG tablet 1 by mouth once daily w/ food as needed for joint/back pain       . Multiple Vitamin (MULTIVITAMIN) tablet Take 1 tablet by mouth daily.          No Known Allergies   Review of Systems        See HPI - all other systems neg except as noted...      The patient complains of decreased hearing, dyspnea on exertion, peripheral edema, incontinence, muscle weakness, and difficulty walking.  The patient denies anorexia, fever, weight loss, weight gain, vision loss, hoarseness, chest pain, syncope, prolonged cough, headaches, hemoptysis, abdominal pain, melena, hematochezia, severe indigestion/heartburn, hematuria, suspicious skin lesions, transient blindness, depression, unusual weight change, abnormal bleeding, enlarged lymph nodes, and angioedema.     Objective:   Physical Exam      WD, WN, 77 y/o BF in NAD... GENERAL:  Alert, pleasant & cooperative; she is slow moving as noted... HEENT:  [place]/AT, EOM-full, EACs- some wax, TMs-wnl, NOSE-clear, THROAT-clear & wnl. NECK:  Supple w/ fairROM; no JVD; normal carotid impulses w/o bruits; no thyromegaly or nodules palpated; no lymphadenopathy. CHEST:  Clear to P & A; without wheezes/ rales/ or rhonchi., noted kyphosis... HEART:  Regular Rhythm; without murmurs/ rubs/ or gallops heard... ABDOMEN:  Soft & nontender; normal bowel sounds; no organomegaly or masses detected. EXT: without  deformities, mod arthritic changes; no varicose veins/ +venous insuffic/ 1-2+edema... NEURO:   Alert, answers questions appropriately, no focal neuro deficits... DERM:  No lesions noted; no rash etc...  MMSE> 06/13/10> Score 26/30 (missed place, 2/3 recall after distraction, & copy figure)...  REVIEW OF WEIGHTS: Jun09 to Jun10 - weights varied betw 123-129# Jun10 weight= 123# w/ 1+ edema reported. Jul10 weight= 117# w/ 1+ edema present. Sep10 weight= 122# w/ mod pedal edema. Jan11 weight = 119# w/ min edema. Jun11 weight = 123# w/ persist edema. GUR42 weight = 118# w/ min swelling Apr12 weight = 114#... rec to add nutritional supplement daily. HCW23 weight = 126#... She has mild pedal edema & advised to inc Lasix20 to 2Qam.   Assessment & Plan:   AR & ASTHMA>  Breathing is stable, at baseline, she has chr rhinorrhea complaints w/ numerous discussions regarding management of this chronic problem....  HBP>  Controlled on meds, continue same...  Ven insuffic>  Reminded of sodium restriction etc; asked to incr her Lasix20 to 1-2 Qam for swelling...  CHOL>  She has the world's best HDL!!!  GU>  She wants another appt w/ DrTannenbaum regarding her incont...  DJD/ Osteopenia>  Aware, she is stable on OTC meds, she has repeatedly refuse Bisphos meds for her bones...  Dementia>  She & husb [name] are still living independently...  Other medical problems as noted.Marland KitchenMarland Kitchen

## 2011-05-31 ENCOUNTER — Encounter: Payer: Self-pay | Admitting: Pulmonary Disease

## 2011-06-24 ENCOUNTER — Telehealth: Payer: Self-pay | Admitting: Pulmonary Disease

## 2011-06-24 MED ORDER — FUROSEMIDE 20 MG PO TABS: 20.0000 mg | ORAL_TABLET | ORAL | Status: DC

## 2011-06-24 NOTE — Telephone Encounter (Signed)
Refill sent. Pt aware.Jennifer Castillo, CMA  

## 2011-06-26 ENCOUNTER — Telehealth: Payer: Self-pay | Admitting: Pulmonary Disease

## 2011-06-26 NOTE — Telephone Encounter (Signed)
NOTE: I HAVE PLACED THIS ON LEIGH'S COMPUTER (WITH OTHER NOTES FOR LEIGH'S ATTENTION WHEN SHE RETURNS NEXT WEEK). Hazel Sams

## 2011-06-27 ENCOUNTER — Ambulatory Visit (INDEPENDENT_AMBULATORY_CARE_PROVIDER_SITE_OTHER): Payer: Medicare Other | Admitting: Adult Health

## 2011-06-27 ENCOUNTER — Other Ambulatory Visit (INDEPENDENT_AMBULATORY_CARE_PROVIDER_SITE_OTHER): Payer: Medicare Other

## 2011-06-27 ENCOUNTER — Encounter: Payer: Self-pay | Admitting: Adult Health

## 2011-06-27 VITALS — BP 142/84 | HR 51 | Temp 97.7°F | Ht 60.0 in | Wt 123.0 lb

## 2011-06-27 DIAGNOSIS — I10 Essential (primary) hypertension: Secondary | ICD-10-CM

## 2011-06-27 DIAGNOSIS — I872 Venous insufficiency (chronic) (peripheral): Secondary | ICD-10-CM

## 2011-06-27 LAB — BASIC METABOLIC PANEL
CO2: 32 mEq/L (ref 19–32)
Calcium: 10.2 mg/dL (ref 8.4–10.5)
Chloride: 99 mEq/L (ref 96–112)
Potassium: 3.8 mEq/L (ref 3.5–5.1)
Sodium: 140 mEq/L (ref 135–145)

## 2011-06-27 LAB — CBC WITH DIFFERENTIAL/PLATELET
Basophils Relative: 0.6 % (ref 0.0–3.0)
Eosinophils Absolute: 0.1 10*3/uL (ref 0.0–0.7)
HCT: 38.3 % (ref 36.0–46.0)
Hemoglobin: 12.9 g/dL (ref 12.0–15.0)
Lymphocytes Relative: 32.4 % (ref 12.0–46.0)
Lymphs Abs: 1.8 10*3/uL (ref 0.7–4.0)
MCHC: 33.6 g/dL (ref 30.0–36.0)
MCV: 87 fl (ref 78.0–100.0)
Neutro Abs: 3 10*3/uL (ref 1.4–7.7)
RBC: 4.4 Mil/uL (ref 3.87–5.11)

## 2011-06-27 MED ORDER — FUROSEMIDE 20 MG PO TABS: ORAL_TABLET | ORAL | Status: DC

## 2011-06-27 NOTE — Assessment & Plan Note (Signed)
Refills of lasix given  Can go back to 2 tabs daily of lasix to help with edema/VI  Labs pending.  Low salt diet Leg elevation

## 2011-06-27 NOTE — Patient Instructions (Signed)
We have updated your Lasix prescription to the pharmacy  I will call with labs results.  Low salt diet   Please contact office for sooner follow up if symptoms do not improve or worsen or seek emergency care

## 2011-06-27 NOTE — Assessment & Plan Note (Signed)
Slightly elevated, refills on lasix given ,so she may go back to 2 tabs of lasix daily which may help her b/p control  Advised on low salt diet  Labs pending.

## 2011-06-27 NOTE — Progress Notes (Signed)
Subjective:    Patient ID: Jessica [name], female    DOB: 10-12-1911, 77 y.o.   MRN: [id number]  HPI 78 y/o BF here for a follow up visit...  She has mult medical problems including:  AR & runny nose;  Asthmatic Bronchitis;  HBP;  Chronic VI & edema;  Hyperchol;  GERD & esoph stricture;  Divertics;  Hx Urinary incontinence & UTIs;  DJD/ Osteoporosis;  Semile Dementia;  Anxiety...  ~  Oct11:  she is upset that she had to wait 230mo for f/u appt- wants them Q956mo... she turned 77 y/o yest... she has list of complaints:  feet burning> known neuropathy but not bad enough for new med yet;  weak back> we discussed back brace Prn;  ?do I have blood clots/> no;  eyes burning, nose running, mult complaints about husb> all discussed...  breathing is stable;  no CP, palpit, ch in SOB;  BP controlled on meds;  otherw stable & she will ret 30mo fasting for labs.  ~  December 26, 2010:  30mo ROV & clinically stable but she persists w/ mult somatic complaints:  hips> offered Ortho eval- "it's not that bad", has Mobic for Prn use;  runny nose: chr complaint, ?doesn't remember our numerous prev conversations- rec OTC Zyrtek;  "[name] needs his nerve pill"> Alpraz 0.25mg  written;  Mattisen wants labs done (ate strawberries & milk this AM)> routine labs all wnl, TChol is elev but HDL is 151!  ~  March 27, 2011:  30mo ROV & she's glad to be here because "things aren't doing to good"> c/o knees weak, knee pain (Mobic helps), noct leg discomfrt (Mobic helps this too), bladder trouble & she wants to see DrTannenbaum again as DrNesi hasn't been able to help her;  Wants to take Fish Oil- OK;  We reviewed her meds & her full labs from 1/12 w/ HDL= 151!!!  ~  May 30, 2011:  56mo ROV & she states "Oh, not doing so well"> c/o allergy & runny nose; feet & legs are burning & stinging (reviewed neuropathy w/ her & offered med but she declines meds at this time); wants more PT & balance training from the same [facility] therapist who comes to the house  now for regular PT; wants power chair & I wrote Rx for [facility] to start the process; never went to see DrTannenbaum after last OV & she wants to resched this appt; finally she notes that Q69mo appts aren't satisfactory for her & she wants to be seen every 56months now...  ~06/27/2011 Acute OV  Pt presents for work in visit . Complains of elevated BP this AM was 170/80.   Marland Kitchen  pt also reports increased LE edema, esp in the left foot/ankle x3-4days. Pt has been low on her lasix for last couple of weeks. Last visit . Lasix was increased to 2 tabs daily which helped her swelling. She has only been taking  1 daily for last week because she is out of refills and did not want to run out. She denies headache,vision/speech changes, chest pain or ext weakness. B/p today in office is ~142/84.           Problem List:  Hx of ALLERGIES (ICD-995.3) - she uses OTC meds as needed, & we prev discussed Zyrtek 10mg Qam, Saline nasal mist, & ATROVENT Nasal 0.03% Tid for drainage (one of her chronic complaints)...  Hx of ASTHMATIC BRONCHITIS, ACUTE (ICD-466.0) - no recent exacerbations...  HYPERTENSION (ICD-401.9) - controlled on LISINOPRIL 20mg - 1  daily & FUROSEMIDE 20mg - 1-2 each AM... ~  4/12:  BP= 110/60 and she states that she is taking her meds regularly & tolerating them well... denies HA, visual changes, CP, palpit, change in dyspnea, etc... but notes weakness, intermittently dizzy & persist edema in her feet...  ~  6/12:  BP= 124/70 & she remains stable...  VENOUS INSUFFICIENCY, CHRONIC (ICD-459.81) - there is mild pedal edema bilat L>R... on low sodium diet, elevation, support hose- prev refused diuretic meds due to urinary symptoms, now on LASIX 20mg - 1-2 Qam.  HYPERCHOLESTEROLEMIA (ICD-272.0) - prev on Lip10, but she stopped this on her own... she has a very high HDL!!! ~  FLP was 1/08 showed TChol 253, TG 125, HDL 121, LDL 93 ~  FLP 2/09 showed TChol 200, TG 99, HDL 136, LDL 44 ~  FLP 4/11 showed TChol 244, TG  143, HDL 149, LDL 59 ~  FLP 1/12 showed TChol 266, TG 102, HDL 151, LDL 83 >> BEST HDL I'VE EVER SEEN  GERD (ZOX-096.04) - last EGD was 9/05 by DrPerry showing GERD and esoph stricture- dilated... she stopped her Nexium but states that her swallowing is good & she uses PRILOSEC as needed.  DIVERTICULOSIS OF COLON (ICD-562.10) - last colonoscopy was 11/00 showing divertics only...  HX, URINARY INFECTION (ICD-V13.02) & INCONTINENCE symptoms... she has seen DrTannenbaum & staff on bladder exercises and she reported some improvement but has persistant symptoms & never followed up... ~  7/10:  offered f/u appt w/ Urology vs second opinion consult but she declines... ~  9/10:  wrote for trial Enablex 7.5mg /d >> no benefit she says. ~  12/10: saw DrNesi- tried sample med, sl improved, but "he turned me over to a lady for longer term treatments" she says... ~  4/12 & 6/12: states DrNesi hasn't been able to help her & she wants appt w/ DrTannenbaum.  DEGENERATIVE JOINT DISEASE (ICD-715.90) - notes right shoulder symptoms, but managing OK w/ MOBIC 7.5mg  & Tylenol; also right hip & low back pain...  she also c/o neuropathic "burning" in legs but not bad enough for additional meds she says...  OSTEOPOROSIS (ICD-733.00) - she was on Fosamax but had discontinued this on her own... we discussed the need for continued therapy and she was asked to restart Alendronate but she never did...  she takes Ca++, MVI, VitD...  SENILE DEMENTIA (ICD-290.0) - she takes ASA 81mg /d... she tried Aricept in the past but didn't notice any improvement therefore stopped it. ~  she & her husb still live on their own but are having numerous difficulties while still resisting any thoughts of assisted living etc> got concerned because she saw an impulse in her left antecubital fossa (tortuous brachial art), went to the ER & this got translated to palpitations & lead to full ER cardiac eval= neg... ~  6/11: MMSE showed 26/30 & she agreed  to try DONEPEZIL 10mg /d... ~  6/12:  Still taking Aricept & appears stable...  ANXIETY (ICD-300.00) - she uses Alpraz 0.25mg  as needed.  Hx of SHINGLES (ICD-053.9)   Past Surgical History  Procedure Date  . Vesicovaginal fistula closure w/ tah   . Tonsillectomy     Outpatient Encounter Prescriptions as of 06/27/2011  Medication Sig Dispense Refill  . ALPRAZolam (XANAX) 0.25 MG tablet 1/2 -1 tablet by mouth three times a day as needed for nerves  90 tablet  5  . amLODipine (NORVASC) 5 MG tablet Take 1 tablet (5 mg total) by mouth daily.  90  tablet  3  . Calcium Carbonate-Vitamin D 600-400 MG-UNIT per tablet Take 1 tablet by mouth 2 (two) times daily.        . Cholecalciferol (VITAMIN D) 1000 UNITS capsule Take 1,000 Units by mouth daily.        Marland Kitchen donepezil (ARICEPT) 10 MG tablet Take 10 mg by mouth daily.        Marland Kitchen lisinopril (PRINIVIL,ZESTRIL) 20 MG tablet Take 20 mg by mouth daily.        . meloxicam (MOBIC) 7.5 MG tablet 1 by mouth once daily w/ food as needed for joint/back pain       . Multiple Vitamin (MULTIVITAMIN) tablet Take 1 tablet by mouth daily.        Marland Kitchen DISCONTD: furosemide (LASIX) 20 MG tablet Take 1 tablet (20 mg total) by mouth every morning. For swelling  30 tablet  6  . aspirin 81 MG tablet Take 81 mg by mouth daily.        . furosemide (LASIX) 20 MG tablet 1-2 tabs daily for swelling  60 tablet  11    No Known Allergies   Review of Systems        Constitutional:   No  weight loss, night sweats,  Fevers, chills, fatigue, or  lassitude.  HEENT:   No headaches,  Difficulty swallowing,  Tooth/dental problems, or  Sore throat,                No sneezing, itching, ear ache, nasal congestion, post nasal drip,   CV:  No chest pain,  Orthopnea, PND, swelling in lower extremities, anasarca, dizziness, palpitations, syncope.   GI  No heartburn, indigestion, abdominal pain, nausea, vomiting, diarrhea, change in bowel habits, loss of appetite, bloody stools.   Resp: No  shortness of breath with exertion or at rest.  No excess mucus, no productive cough,  No non-productive cough,  No coughing up of blood.  No change in color of mucus.  No wheezing.  No chest wall deformity  Skin: no rash or lesions.  GU: no dysuria, change in color of urine, no urgency or frequency.  No flank pain, no hematuria   MS:  No joint pain or swelling.  No decreased range of motion.  No back pain.  Psych:  No change in mood or affect. No depression or anxiety.  No memory loss.       Objective:   Physical Exam      WD, WN, 77 y/o BF in NAD... GENERAL:  Alert, pleasant & cooperative; she is slow moving as noted... HEENT:  /AT, EOM-full, EACs- some wax, TMs-wnl, NOSE-clear, THROAT-clear & wnl. NECK:  Supple w/ fairROM; no JVD; normal carotid impulses w/o bruits; no thyromegaly or nodules palpated; no lymphadenopathy. CHEST:  Clear to P & A; without wheezes/ rales/ or rhonchi., noted kyphosis... HEART:  Regular Rhythm; without murmurs/ rubs/ or gallops heard... ABDOMEN:  Soft & nontender; normal bowel sounds; no organomegaly or masses detected. EXT: without deformities, mod arthritic changes; no varicose veins/ +venous insuffic/ 1-2+edema... NEURO:   Alert, answers questions appropriately, no focal neuro deficits... DERM:  No lesions noted; no rash etc...       Assessment & Plan:

## 2011-07-01 ENCOUNTER — Telehealth: Payer: Self-pay | Admitting: Pulmonary Disease

## 2011-07-01 MED ORDER — LISINOPRIL 20 MG PO TABS: 20.0000 mg | ORAL_TABLET | Freq: Every day | ORAL | Status: DC

## 2011-07-01 NOTE — Telephone Encounter (Signed)
RX sent. Pt aware. 

## 2011-07-02 NOTE — Telephone Encounter (Signed)
Form has been signed by SN and faxed back to [facility] for pt eval.

## 2011-07-03 ENCOUNTER — Other Ambulatory Visit: Payer: Self-pay | Admitting: *Deleted

## 2011-07-03 MED ORDER — DONEPEZIL HCL 10 MG PO TABS: 10.0000 mg | ORAL_TABLET | Freq: Every day | ORAL | Status: DC

## 2011-08-01 ENCOUNTER — Ambulatory Visit (INDEPENDENT_AMBULATORY_CARE_PROVIDER_SITE_OTHER): Payer: Medicare Other | Admitting: Pulmonary Disease

## 2011-08-01 ENCOUNTER — Encounter: Payer: Self-pay | Admitting: Pulmonary Disease

## 2011-08-01 DIAGNOSIS — M1990 Unspecified osteoarthritis, unspecified site: Secondary | ICD-10-CM

## 2011-08-01 DIAGNOSIS — K5900 Constipation, unspecified: Secondary | ICD-10-CM

## 2011-08-01 DIAGNOSIS — R609 Edema, unspecified: Secondary | ICD-10-CM

## 2011-08-01 DIAGNOSIS — R32 Unspecified urinary incontinence: Secondary | ICD-10-CM

## 2011-08-01 DIAGNOSIS — G589 Mononeuropathy, unspecified: Secondary | ICD-10-CM

## 2011-08-01 DIAGNOSIS — M810 Age-related osteoporosis without current pathological fracture: Secondary | ICD-10-CM

## 2011-08-01 DIAGNOSIS — F411 Generalized anxiety disorder: Secondary | ICD-10-CM

## 2011-08-01 DIAGNOSIS — I872 Venous insufficiency (chronic) (peripheral): Secondary | ICD-10-CM

## 2011-08-01 DIAGNOSIS — I10 Essential (primary) hypertension: Secondary | ICD-10-CM

## 2011-08-01 DIAGNOSIS — K5730 Diverticulosis of large intestine without perforation or abscess without bleeding: Secondary | ICD-10-CM

## 2011-08-01 DIAGNOSIS — F0390 Unspecified dementia without behavioral disturbance: Secondary | ICD-10-CM

## 2011-08-01 NOTE — Patient Instructions (Signed)
Today we updated your med list in EPIC...    Continue your current meds the same...  For your constipation:    Try the [facility]- one capful in water or juice daily...    ...and try the SENAKOT-S 1-2 tabs at bedtime...  Hang-in-there, & try to decrease your stress level...  Let's continue our every 2 month rechecks.Marland KitchenMarland Kitchen

## 2011-08-01 NOTE — Progress Notes (Signed)
Subjective:    Patient ID: Jessica [name], female    DOB: 1911-02-16, 77 y.o.   MRN: [id number]  HPI 77 y/o BF here for a follow up visit...  She has mult medical problems including:  AR & runny nose;  Asthmatic Bronchitis;  HBP;  Chronic VI & edema;  Hyperchol;  GERD & esoph stricture;  Divertics;  Hx Urinary incontinence & UTIs;  DJD/ Osteoporosis;  Semile Dementia;  Anxiety...  ~  December 26, 2010:  44mo ROV & clinically stable but she persists w/ mult somatic complaints:  hips> offered Ortho eval- "it's not that bad", has Mobic for Prn use;  runny nose: chr complaint, ?doesn't remember our numerous prev conversations- rec OTC Zyrtek;  "[name] needs his nerve pill"> Alpraz 0.25mg  written;  Kenna wants labs done (ate strawberries & milk this AM)> routine labs all wnl, TChol is elev but HDL is 151!  ~  March 27, 2011:  44mo ROV & she's glad to be here because "things aren't doing to good"> c/o knees weak, knee pain (Mobic helps), noct leg discomfrt (Mobic helps this too), bladder trouble & she wants to see DrTannenbaum again as DrNesi hasn't been able to help her;  Wants to take Fish Oil- OK;  We reviewed her meds & her full labs from 1/12 w/ HDL= 151!!!  ~  May 30, 2011:  40mo ROV & she states "Oh, not doing so well"> c/o allergy & runny nose; feet & legs are burning & stinging (reviewed neuropathy w/ her & offered med but she declines meds at this time); wants more PT & balance training from the same [facility] therapist who comes to the house now for regular PT; wants power chair & I wrote Rx for [facility] to start the process; never went to see DrTannenbaum after last OV & she wants to resched this appt; finally she notes that Q1mo appts aren't satisfactory for her & she wants to be seen every 40months now...  ~  August 01, 2011:  40mo ROV & she reports that she's enjoyed the therapy, waiting for her power chair, & has appt w/ Urology next week...  She persists w/ mult somatic complaints> swelling in feet (edema is  actually down w/ Lasix 20mg - 2Qam & wt down 6# to 120# today); she notes that her nose runs "off & on for >22yr", legs achy & numb, back weak, wonders about allergy shots...  BP looks good; denies CP, palpit, syncope, ch in SOB, or cerebral ischemic symptoms...  She notes some constipation & we discussed Miralax & Senakot-S therapy...  She is really frustrated w/ [name]'s needs.        Problem List:  Hx of ALLERGIES (ICD-995.3) - she uses OTC meds as needed, & we prev discussed Zyrtek 10mg Qam, Saline nasal mist, & ATROVENT Nasal 0.03% Tid for drainage (one of her chronic complaints)...  Hx of ASTHMATIC BRONCHITIS, ACUTE (ICD-466.0) - no recent exacerbations...  HYPERTENSION (ICD-401.9) - controlled on LISINOPRIL 20mg - 1 daily & FUROSEMIDE 20mg - 1-2 each AM... ~  4/12:  BP= 110/60 and she states that she is taking her meds regularly & tolerating them well... denies HA, visual changes, CP, palpit, change in dyspnea, etc... but notes weakness, intermittently dizzy & persist edema in her feet...  ~  6/12:  BP= 124/70 & she remains stable... ~  8/12:  BP= 110/ 66 stable...  VENOUS INSUFFICIENCY, CHRONIC (ICD-459.81) - there is mild pedal edema bilat L>R... on low sodium diet, elevation, support hose- prev refused diuretic  meds due to urinary symptoms, now on LASIX 20mg - 1-2 Qam.  HYPERCHOLESTEROLEMIA (ICD-272.0) - prev on Lip10, but she stopped this on her own... she has a very high HDL!!! ~  FLP was 1/08 showed TChol 253, TG 125, HDL 121, LDL 93 ~  FLP 2/09 showed TChol 200, TG 99, HDL 136, LDL 44 ~  FLP 4/11 showed TChol 244, TG 143, HDL 149, LDL 59 ~  FLP 1/12 showed TChol 266, TG 102, HDL 151, LDL 83 >> BEST HDL I'VE EVER SEEN  GERD (VHQ-469.62) - last EGD was 9/05 by DrPerry showing GERD and esoph stricture- dilated... she stopped her Nexium but states that her swallowing is good & she uses PRILOSEC OTC as needed.  DIVERTICULOSIS OF COLON (ICD-562.10) - last colonoscopy was 11/00 showing  divertics only... ~  8/12:  She notes some constipation & rec to take [facility] & SENAKOT-S per our usual protocol...  HX, URINARY INFECTION (ICD-V13.02) & INCONTINENCE symptoms... she has seen DrTannenbaum & staff on bladder exercises and she reported some improvement but has persistant symptoms & never followed up... ~  7/10:  offered f/u appt w/ Urology vs second opinion consult but she declines... ~  9/10:  wrote for trial Enablex 7.5mg /d >> no benefit she says. ~  12/10: saw DrNesi- tried sample med, sl improved, but "he turned me over to a lady for longer term treatments" she says... ~  4/12 & 6/12: states DrNesi hasn't been able to help her & she wants appt w/ DrTannenbaum==> pending.  DEGENERATIVE JOINT DISEASE (ICD-715.90) - notes right shoulder symptoms, but managing OK w/ MOBIC 7.5mg  & Tylenol; also right hip & low back pain...  she also c/o neuropathic "burning" in legs but not bad enough for additional meds she says...  OSTEOPOROSIS (ICD-733.00) - she was on Fosamax but had discontinued this on her own... we discussed the need for continued therapy and she was asked to restart Alendronate but she never did...  she takes Ca++, MVI, VitD...  SENILE DEMENTIA (ICD-290.0) - she takes ASA 81mg /d... she tried Aricept in the past but didn't notice any improvement therefore stopped it. ~  she & her husb still live on their own but are having numerous difficulties while still resisting any thoughts of assisted living etc> got concerned because she saw an impulse in her left antecubital fossa (tortuous brachial art), went to the ER & this got translated to palpitations & lead to full ER cardiac eval= neg... ~  6/11: MMSE showed 26/30 & she agreed to try DONEPEZIL 10mg /d... ~  6/12:  Still taking Aricept & appears stable...  ANXIETY (ICD-300.00) - she uses Alpraz 0.25mg  as needed.  Hx of SHINGLES (ICD-053.9)   Past Surgical History  Procedure Date  . Vesicovaginal fistula closure w/ tah   .  Tonsillectomy   . Abdominal hysterectomy     Outpatient Encounter Prescriptions as of 08/01/2011  Medication Sig Dispense Refill  . ALPRAZolam (XANAX) 0.25 MG tablet 1/2 -1 tablet by mouth three times a day as needed for nerves  90 tablet  5  . amLODipine (NORVASC) 5 MG tablet Take 1 tablet (5 mg total) by mouth daily.  90 tablet  3  . aspirin 81 MG tablet Take 81 mg by mouth daily.        . Calcium Carbonate-Vitamin D 600-400 MG-UNIT per tablet Take 1 tablet by mouth 2 (two) times daily.        . Cholecalciferol (VITAMIN D) 1000 UNITS capsule Take 1,000 Units  by mouth daily.        Marland Kitchen donepezil (ARICEPT) 10 MG tablet Take 1 tablet (10 mg total) by mouth daily.  30 tablet  5  . furosemide (LASIX) 20 MG tablet 1-2 tabs daily for swelling  60 tablet  11  . lisinopril (PRINIVIL,ZESTRIL) 20 MG tablet Take 1 tablet (20 mg total) by mouth daily.  30 tablet  5  . meloxicam (MOBIC) 7.5 MG tablet 1 by mouth once daily w/ food as needed for joint/back pain       . Multiple Vitamin (MULTIVITAMIN) tablet Take 1 tablet by mouth daily.          No Known Allergies   Current Medications, Allergies, Past Medical History, Past Surgical History, Family History, and Social History were reviewed in [employer] record.    Review of Systems        See HPI - all other systems neg except as noted...      The patient complains of decreased hearing, dyspnea on exertion, peripheral edema, incontinence, muscle weakness, and difficulty walking.  The patient denies anorexia, fever, weight loss, weight gain, vision loss, hoarseness, chest pain, syncope, prolonged cough, headaches, hemoptysis, abdominal pain, melena, hematochezia, severe indigestion/heartburn, hematuria, suspicious skin lesions, transient blindness, depression, unusual weight change, abnormal bleeding, enlarged lymph nodes, and angioedema.     Objective:   Physical Exam      WD, WN, 77 y/o BF in NAD... GENERAL:  Alert, pleasant  & cooperative; she is slow moving as noted... HEENT:  [place]/AT, EOM-full, EACs- some wax, TMs-wnl, NOSE-clear, THROAT-clear & wnl. NECK:  Supple w/ fairROM; no JVD; normal carotid impulses w/o bruits; no thyromegaly or nodules palpated; no lymphadenopathy. CHEST:  Clear to P & A; without wheezes/ rales/ or rhonchi., noted kyphosis... HEART:  Regular Rhythm; without murmurs/ rubs/ or gallops heard... ABDOMEN:  Soft & nontender; normal bowel sounds; no organomegaly or masses detected. EXT:  mod arthritic changes; no varicose veins/ +venous insuffic/ 1-2+edema... NEURO:   Alert, answers questions appropriately, no focal neuro deficits... DERM:  No lesions noted; no rash etc...  MMSE> 06/13/10> Score 26/30 (missed place, 2/3 recall after distraction, & copy figure)...  REVIEW OF WEIGHTS: Jun09 to Jun10 - weights varied betw 123-129# Jun10 weight= 123# w/ 1+ edema reported. Jul10 weight= 117# w/ 1+ edema present. Sep10 weight= 122# w/ mod pedal edema. Jan11 weight = 119# w/ min edema. Jun11 weight = 123# w/ persist edema. NFA21 weight = 118# w/ min swelling Apr12 weight = 114#... rec to add nutritional supplement daily. HYQ65 weight = 126#... She has mild pedal edema & advised to inc Lasix20 to 2Qam. Aug12 weight = 120#... Decreased edema.   Assessment & Plan:   AR & ASTHMA>  Breathing is stable, at baseline, she has chr rhinorrhea complaints w/ numerous discussions regarding management of this chronic problem....  HBP>  Controlled on meds, continue same...  Ven insuffic>  Reminded of sodium restriction etc; asked to keep her Lasix20 at 2 Qam for swelling...  CHOL>  She has the world's best HDL!!!  GU>  She wants another appt w/ DrTannenbaum regarding her incont ==> set up for next week.  DJD/ Osteopenia>  Aware, she is stable on OTC meds, she has repeatedly refuse Bisphos meds for her bones...  Dementia>  She & husb [name] are still living independently...  Other medical problems as  noted.Marland KitchenMarland Kitchen

## 2011-08-14 ENCOUNTER — Telehealth: Payer: Self-pay | Admitting: Pulmonary Disease

## 2011-08-14 NOTE — Telephone Encounter (Signed)
Spoke with pt and she states that a company called [employer] has been calling her multiple times and trying to get her to order from them. I advised her if they are a telemarkting company then just advise them to quit calling. She states she will

## 2011-09-04 ENCOUNTER — Ambulatory Visit: Payer: Medicare Other | Admitting: Physical Therapy

## 2011-09-12 DIAGNOSIS — I872 Venous insufficiency (chronic) (peripheral): Secondary | ICD-10-CM

## 2011-09-12 DIAGNOSIS — IMO0001 Reserved for inherently not codable concepts without codable children: Secondary | ICD-10-CM

## 2011-09-12 DIAGNOSIS — F411 Generalized anxiety disorder: Secondary | ICD-10-CM

## 2011-09-12 DIAGNOSIS — I10 Essential (primary) hypertension: Secondary | ICD-10-CM

## 2011-09-12 DIAGNOSIS — R269 Unspecified abnormalities of gait and mobility: Secondary | ICD-10-CM

## 2011-09-12 DIAGNOSIS — IMO0002 Reserved for concepts with insufficient information to code with codable children: Secondary | ICD-10-CM

## 2011-09-12 DIAGNOSIS — M6281 Muscle weakness (generalized): Secondary | ICD-10-CM

## 2011-09-23 ENCOUNTER — Ambulatory Visit: Payer: Medicare Other | Attending: Pulmonary Disease | Admitting: Physical Therapy

## 2011-09-23 DIAGNOSIS — IMO0001 Reserved for inherently not codable concepts without codable children: Secondary | ICD-10-CM | POA: Insufficient documentation

## 2011-09-23 DIAGNOSIS — R262 Difficulty in walking, not elsewhere classified: Secondary | ICD-10-CM | POA: Insufficient documentation

## 2011-09-30 ENCOUNTER — Telehealth: Payer: Self-pay | Admitting: Pulmonary Disease

## 2011-09-30 NOTE — Telephone Encounter (Signed)
lmomtcb for megan. Yes pt can use OV 10/03/11 to qualify for her power wheel chair but that is what the apt will consist of.

## 2011-10-01 NOTE — Telephone Encounter (Signed)
Spoke with Misty Stanley at [facility] and advised her that the upcoming visit will be for power wheel chair. Nothing further needed. Julaine Hua, CMA

## 2011-10-01 NOTE — Telephone Encounter (Signed)
[name] returned call & can be reached at [phone].  Jessica [name]

## 2011-10-03 ENCOUNTER — Encounter: Payer: Self-pay | Admitting: Pulmonary Disease

## 2011-10-03 ENCOUNTER — Ambulatory Visit (INDEPENDENT_AMBULATORY_CARE_PROVIDER_SITE_OTHER): Payer: Medicare Other | Admitting: Pulmonary Disease

## 2011-10-03 DIAGNOSIS — F411 Generalized anxiety disorder: Secondary | ICD-10-CM

## 2011-10-03 DIAGNOSIS — K219 Gastro-esophageal reflux disease without esophagitis: Secondary | ICD-10-CM

## 2011-10-03 DIAGNOSIS — I10 Essential (primary) hypertension: Secondary | ICD-10-CM

## 2011-10-03 DIAGNOSIS — J209 Acute bronchitis, unspecified: Secondary | ICD-10-CM

## 2011-10-03 DIAGNOSIS — E7800 Pure hypercholesterolemia, unspecified: Secondary | ICD-10-CM

## 2011-10-03 DIAGNOSIS — R32 Unspecified urinary incontinence: Secondary | ICD-10-CM

## 2011-10-03 DIAGNOSIS — F0390 Unspecified dementia without behavioral disturbance: Secondary | ICD-10-CM

## 2011-10-03 DIAGNOSIS — I872 Venous insufficiency (chronic) (peripheral): Secondary | ICD-10-CM

## 2011-10-03 DIAGNOSIS — G589 Mononeuropathy, unspecified: Secondary | ICD-10-CM

## 2011-10-03 DIAGNOSIS — Z23 Encounter for immunization: Secondary | ICD-10-CM

## 2011-10-03 DIAGNOSIS — R5381 Other malaise: Secondary | ICD-10-CM

## 2011-10-03 DIAGNOSIS — R609 Edema, unspecified: Secondary | ICD-10-CM

## 2011-10-03 DIAGNOSIS — M810 Age-related osteoporosis without current pathological fracture: Secondary | ICD-10-CM

## 2011-10-03 DIAGNOSIS — K5730 Diverticulosis of large intestine without perforation or abscess without bleeding: Secondary | ICD-10-CM

## 2011-10-03 DIAGNOSIS — M1990 Unspecified osteoarthritis, unspecified site: Secondary | ICD-10-CM

## 2011-10-03 MED ORDER — LISINOPRIL 20 MG PO TABS: 20.0000 mg | ORAL_TABLET | Freq: Every day | ORAL | Status: DC

## 2011-10-03 MED ORDER — AMLODIPINE BESYLATE 5 MG PO TABS: 5.0000 mg | ORAL_TABLET | Freq: Every day | ORAL | Status: DC

## 2011-10-03 NOTE — Progress Notes (Signed)
Subjective:    Patient ID: Jessica [name], female    DOB: [birth year], 77 y.o.   MRN: [id number]  HPI 77 y/o BF here for a follow up visit...  She has mult medical problems including:  AR & runny nose;  Asthmatic Bronchitis;  HBP;  Chronic VI & edema;  Hyperchol;  GERD & esoph stricture;  Divertics;  Hx Urinary incontinence & UTIs;  DJD/ Osteoporosis;  Semile Dementia;  Anxiety...  ~  December 26, 2010:  37mo ROV & clinically stable but she persists w/ mult somatic complaints:  hips> offered Ortho eval- "it's not that bad", has Mobic for Prn use;  runny nose: chr complaint, ?doesn't remember our numerous prev conversations- rec OTC Zyrtek;  "Jessica [name] needs his nerve pill"> Alpraz 0.25mg  written;  Aneyah wants labs done (ate strawberries & milk this AM)> routine labs all wnl, TChol is elev but HDL is 151!  ~  March 27, 2011:  37mo ROV & she's glad to be here because "things aren't doing to good"> c/o knees weak, knee pain (Mobic helps), noct leg discomfrt (Mobic helps this too), bladder trouble & she wants to see DrTannenbaum again as DrNesi hasn't been able to help her;  Wants to take Fish Oil- OK;  We reviewed her meds & her full labs from 1/12 w/ HDL= 151!!!  ~  May 30, 2011:  36mo ROV & she states "Oh, not doing so well"> c/o allergy & runny nose; feet & legs are burning & stinging (reviewed neuropathy w/ her & offered med but she declines meds at this time); wants more PT & balance training from the same [facility] therapist who comes to the house now for regular PT; wants power chair & I wrote Rx for [facility] to start the process; never went to see DrTannenbaum after last OV & she wants to resched this appt; finally she notes that Q36mo appts aren't satisfactory for her & she wants to be seen every 36months now...  ~  August 01, 2011:  36mo ROV & she reports that she's enjoyed the therapy, waiting for her power chair, & has appt w/ Urology next week...  She persists w/ mult somatic complaints> swelling in feet (edema is  actually down w/ Lasix 20mg - 2Qam & wt down 6# to 120# today); she notes that her nose runs "off & on for >30yr", legs achy & numb, back weak, wonders about allergy shots...  BP looks good; denies CP, palpit, syncope, ch in SOB, or cerebral ischemic symptoms...  She notes some constipation & we discussed Miralax & Senakot-S therapy...  She is really frustrated w/ [name]'s needs.  ~  October 03, 2011:  36mo ROV & here for face-to-face mobility eval per [facility] for her power wheelchair> forms completed & will be faxed to [facility] & scanned into the EMR... She states "Fair but not too good" today> notes that her legs are "nummy" & we discussed neuropathy & offered Gabapentin Rx but she doesn't want more pills;  C/o continued balance problems for which she had balance training which helped but she wants me to request MORE balance PT;  BP controlled, wt stable, persist LE edema L>R, etc...  She saw DrTannenbaum recently for Urology f/u w/ small hypersens bladder & large bladder diverticulum, not a surg candidate, taught "double voiding" tech; OK Flu vaccine, refills provided per request...        Problem List:  Hx of ALLERGIES (ICD-995.3) - she uses OTC meds as needed, & we prev discussed Zyrtek 10mg Qam,  Saline nasal mist, & ATROVENT Nasal 0.03% Tid for drainage (one of her chronic complaints)...  Hx of ASTHMATIC BRONCHITIS, ACUTE (ICD-466.0) - no recent exacerbations...  HYPERTENSION (ICD-401.9) - controlled on NORVASC 5mg , LISINOPRIL 20mg /d & FUROSEMIDE 20mg - 1-2 each AM... ~  4/12:  BP= 110/60 and she states that she is taking her meds regularly & tolerating them well... denies HA, visual changes, CP, palpit, change in dyspnea, etc... but notes weakness, intermittently dizzy & persist edema in her feet...  ~  6/12:  BP= 124/70 & she remains stable... ~  8/12:  BP= 110/ 66 & stable... ~  10/12:  BP= 114/62 & stable...  VENOUS INSUFFICIENCY, CHRONIC (ICD-459.81) - there is mild pedal edema bilat L>R... on low  sodium diet, elevation, support hose- prev refused diuretic meds due to urinary symptoms, now on LASIX 20mg - 1-2 Qam.  HYPERCHOLESTEROLEMIA (ICD-272.0) - prev on Lip10, but she stopped this on her own... she has a very high HDL!!! ~  FLP was 1/08 showed TChol 253, TG 125, HDL 121, LDL 93 ~  FLP 2/09 showed TChol 200, TG 99, HDL 136, LDL 44 ~  FLP 4/11 showed TChol 244, TG 143, HDL 149, LDL 59 ~  FLP 1/12 showed TChol 266, TG 102, HDL 151, LDL 83 >> BEST HDL I'VE EVER SEEN  GERD (JXB-147.82) - last EGD was 9/05 by DrPerry showing GERD and esoph stricture- dilated... she stopped her Nexium but states that her swallowing is good & she uses PRILOSEC OTC as needed.  DIVERTICULOSIS OF COLON (ICD-562.10) - last colonoscopy was 11/00 showing divertics only... ~  8/12:  She notes some constipation & rec to take [facility] & SENAKOT-S per our usual protocol...  HX, URINARY INFECTION (ICD-V13.02) & INCONTINENCE symptoms... she has seen DrTannenbaum & staff on bladder exercises and she reported some improvement but has persistant symptoms & never followed up... ~  7/10:  offered f/u appt w/ Urology vs second opinion consult but she declines... ~  9/10:  wrote for trial Enablex 7.5mg /d >> no benefit she says. ~  12/10: saw DrNesi- tried sample med, sl improved, but "he turned me over to a lady for longer term treatments" she says... ~  4/12 & 6/12: states DrNesi hasn't been able to help her & she wants appt w/ DrTannenbaum==> seen w/ urodynamic eval revealing a sm hypersens bladder & a large bladder divertic; not a surg candidate, taught double voiding technique...  DEGENERATIVE JOINT DISEASE (ICD-715.90) - notes right shoulder symptoms, but managing OK w/ MOBIC 7.5mg  & Tylenol; also right hip & low back pain...  she also c/o neuropathic "burning" in legs but not bad enough for additional meds she says...  OSTEOPOROSIS (ICD-733.00) - she was on Fosamax but had discontinued this on her own... we discussed the  need for continued therapy and she was asked to restart Alendronate but she never did...  she takes Ca++, MVI, VitD...  SENILE DEMENTIA (ICD-290.0) - she takes ASA 81mg /d... she tried Aricept in the past but didn't notice any improvement therefore stopped it. ~  she & her husb still live on their own but are having numerous difficulties while still resisting any thoughts of assisted living etc> got concerned because she saw an impulse in her left antecubital fossa (tortuous brachial art), went to the ER & this got translated to palpitations & lead to full ER cardiac eval= neg... ~  6/11: MMSE showed 26/30 & she agreed to try DONEPEZIL 10mg /d... ~  6/12:  Still taking Aricept &  appears stable...  ANXIETY (ICD-300.00) - she uses Alpraz 0.25mg  as needed.  Hx of SHINGLES (ICD-053.9)   Past Surgical History  Procedure Date  . Vesicovaginal fistula closure w/ tah   . Tonsillectomy   . Abdominal hysterectomy     Outpatient Encounter Prescriptions as of 10/03/2011  Medication Sig Dispense Refill  . ALPRAZolam (XANAX) 0.25 MG tablet 1/2 -1 tablet by mouth three times a day as needed for nerves  90 tablet  5  . amLODipine (NORVASC) 5 MG tablet Take 1 tablet (5 mg total) by mouth daily.  90 tablet  3  . aspirin 81 MG tablet Take 81 mg by mouth daily.        . Calcium Carbonate-Vitamin D 600-400 MG-UNIT per tablet Take 1 tablet by mouth 2 (two) times daily.        . Cholecalciferol (VITAMIN D) 1000 UNITS capsule Take 1,000 Units by mouth daily.        Marland Kitchen donepezil (ARICEPT) 10 MG tablet Take 1 tablet (10 mg total) by mouth daily.  30 tablet  5  . furosemide (LASIX) 20 MG tablet 1-2 tabs daily for swelling  60 tablet  11  . lisinopril (PRINIVIL,ZESTRIL) 20 MG tablet Take 1 tablet (20 mg total) by mouth daily.  30 tablet  5  . Multiple Vitamin (MULTIVITAMIN) tablet Take 1 tablet by mouth daily.        . meloxicam (MOBIC) 7.5 MG tablet 1 by mouth once daily w/ food as needed for joint/back pain          No Known Allergies   Current Medications, Allergies, Past Medical History, Past Surgical History, Family History, and Social History were reviewed in [employer] record.    Review of Systems        See HPI - all other systems neg except as noted...      The patient complains of decreased hearing, dyspnea on exertion, peripheral edema, incontinence, muscle weakness, and difficulty walking.  The patient denies anorexia, fever, weight loss, weight gain, vision loss, hoarseness, chest pain, syncope, prolonged cough, headaches, hemoptysis, abdominal pain, melena, hematochezia, severe indigestion/heartburn, hematuria, suspicious skin lesions, transient blindness, depression, unusual weight change, abnormal bleeding, enlarged lymph nodes, and angioedema.     Objective:   Physical Exam      WD, WN, 77 y/o BF in NAD... GENERAL:  Alert, pleasant & cooperative; she is slow moving as noted... HEENT:  [place]/AT, EOM-full, EACs- some wax, TMs-wnl, NOSE-clear, THROAT-clear & wnl. NECK:  Supple w/ fairROM; no JVD; normal carotid impulses w/o bruits; no thyromegaly or nodules palpated; no lymphadenopathy. CHEST:  Clear to P & A; without wheezes/ rales/ or rhonchi., noted kyphosis... HEART:  Regular Rhythm; without murmurs/ rubs/ or gallops heard... ABDOMEN:  Soft & nontender; normal bowel sounds; no organomegaly or masses detected. EXT:  mod arthritic changes; no varicose veins/ +venous insuffic/ 1-2+edema... NEURO:   Alert, answers questions appropriately, but slowly, no focal neuro deficits... DERM:  No lesions noted; no rash etc...  MMSE> 06/13/10> Score 26/30 (missed place, 2/3 recall after distraction, & copy figure)...  REVIEW OF WEIGHTS: Jun09 to Jun10 - weights varied betw 123-129# Jun10 weight= 123# w/ 1+ edema reported. Jul10 weight= 117# w/ 1+ edema present. Sep10 weight= 122# w/ mod pedal edema. Jan11 weight = 119# w/ min edema. Jun11 weight = 123# w/ persist  edema. [id number] weight = 118# w/ min swelling Apr12 weight = 114#... rec to add nutritional supplement daily. [id number] weight = 126#.Marland KitchenMarland Kitchen  She has mild pedal edema & advised to inc Lasix20 to 2Qam. Aug12 weight = 120#... Decreased edema.   Assessment & Plan:   Mobility EVAL>  Required for her motorized wheelchair; see forms scanned into the EMR...  AR & ASTHMA>  Breathing is stable, at baseline, she has chr rhinorrhea complaints w/ numerous discussions regarding management of this chronic problem....  HBP>  Controlled on meds, continue same...  Ven insuffic>  Reminded of sodium restriction etc; asked to keep her Lasix20 at 2 Qam for swelling...  CHOL>  She has the world's best HDL!!!  GU>  She saw DrTannenbaum w/ extensive investigation into her voiding symptoms & urodynamics as above; rec to use "double voiding" technique...  DJD/ Osteopenia>  Aware, she is stable on OTC meds, she has repeatedly refuse Bisphos meds for her bones...  Dementia>  She & husb Jessica [name] are still living independently...  Other medical problems as noted.Marland KitchenMarland Kitchen

## 2011-10-03 NOTE — Patient Instructions (Signed)
Today we updated your med list in EPIC...    We refilled your meds per request...  We completed the required FACE-TO-FACE mobility eval required by Medicare to qualify you for a power wheelchair...  We gave you the 2012 flu vaccine today...  Call for any questions...  Let's plan another follow up eval in 2 month's time.Marland KitchenMarland Kitchen

## 2011-10-04 ENCOUNTER — Encounter: Payer: Self-pay | Admitting: Pulmonary Disease

## 2011-10-18 ENCOUNTER — Telehealth: Payer: Self-pay | Admitting: Pulmonary Disease

## 2011-10-18 NOTE — Telephone Encounter (Signed)
Attempted to call pt but no answer.  Will try back later.  

## 2011-10-21 NOTE — Telephone Encounter (Signed)
lmomtcb for pt 

## 2011-10-22 NOTE — Telephone Encounter (Signed)
Unable to reach pt. Will wait for pt to call back

## 2011-10-23 ENCOUNTER — Telehealth: Payer: Self-pay | Admitting: *Deleted

## 2011-10-23 NOTE — Telephone Encounter (Signed)
lmomtcb for pt to reschedule appt on 12/27. 

## 2011-10-24 ENCOUNTER — Telehealth: Payer: Self-pay | Admitting: Pulmonary Disease

## 2011-10-24 NOTE — Telephone Encounter (Signed)
lmomtcb for pt .  Will sign off of message and wait on return call.

## 2011-10-24 NOTE — Telephone Encounter (Signed)
Error.  Duplicate message.  Jessica [name] °- °

## 2011-10-24 NOTE — Telephone Encounter (Signed)
Rescheduled appt to 12/18/11 @ 12:00.  Pt would like to speak w/ SN's nurse regarding motorized wheel chair.  Antionette Fairy

## 2011-10-28 ENCOUNTER — Telehealth: Payer: Self-pay | Admitting: Pulmonary Disease

## 2011-10-28 NOTE — Telephone Encounter (Signed)
Per TP- pt and SN can discuss this at the next ov, no need to do letter or call [employer] for now. Spoke with Eunice Blase and notified her of this and she verbalized understanding.

## 2011-10-28 NOTE — Telephone Encounter (Signed)
Spoke with [name] at [facility]. She states that power wheel chair was denied by [facility] because there was no dx to support lack of trunk support. She states that we can wither leave it alone, or write letter to or call medical director at [facility] and try to get it approved. She states she feels that the pt would benefit from this. Please advise, thanks!

## 2011-10-28 NOTE — Telephone Encounter (Signed)
No need for msg , patient realized she had already spoken to [facility].

## 2011-11-29 ENCOUNTER — Telehealth: Payer: Self-pay | Admitting: Pulmonary Disease

## 2011-11-29 DIAGNOSIS — R5381 Other malaise: Secondary | ICD-10-CM

## 2011-11-29 DIAGNOSIS — F411 Generalized anxiety disorder: Secondary | ICD-10-CM

## 2011-11-29 DIAGNOSIS — F0390 Unspecified dementia without behavioral disturbance: Secondary | ICD-10-CM

## 2011-11-29 NOTE — Telephone Encounter (Signed)
Called and spoke with pt and she stated that she has pain in her left leg--she is having a hard time getting around and is requesting help in the home from a nurse and someone to help her with her balance.  She is aware that we have sent order for nurse eval in the home and they will contact her before they come out to the house.

## 2011-12-02 ENCOUNTER — Telehealth: Payer: Self-pay | Admitting: Pulmonary Disease

## 2011-12-02 MED ORDER — ALPRAZOLAM 0.25 MG PO TABS: ORAL_TABLET | ORAL | Status: DC

## 2011-12-02 NOTE — Telephone Encounter (Signed)
rx for the alprazolam has been sent to the pharmacy per pts request.  Called and spoke with pt and she is aware of rx called to the pharmacy.

## 2011-12-03 ENCOUNTER — Telehealth: Payer: Self-pay | Admitting: Pulmonary Disease

## 2011-12-03 NOTE — Telephone Encounter (Signed)
Pt aware her prescription for Alprazolam is at the pharmacy and they will get this ready for her.

## 2011-12-06 ENCOUNTER — Telehealth: Payer: Self-pay | Admitting: Pulmonary Disease

## 2011-12-06 DIAGNOSIS — I872 Venous insufficiency (chronic) (peripheral): Secondary | ICD-10-CM | POA: Diagnosis not present

## 2011-12-06 DIAGNOSIS — R269 Unspecified abnormalities of gait and mobility: Secondary | ICD-10-CM | POA: Diagnosis not present

## 2011-12-06 DIAGNOSIS — F0390 Unspecified dementia without behavioral disturbance: Secondary | ICD-10-CM | POA: Diagnosis not present

## 2011-12-06 DIAGNOSIS — M6281 Muscle weakness (generalized): Secondary | ICD-10-CM | POA: Diagnosis not present

## 2011-12-06 DIAGNOSIS — F411 Generalized anxiety disorder: Secondary | ICD-10-CM | POA: Diagnosis not present

## 2011-12-06 DIAGNOSIS — I10 Essential (primary) hypertension: Secondary | ICD-10-CM | POA: Diagnosis not present

## 2011-12-06 NOTE — Telephone Encounter (Signed)
Spoke with Diane and gave VO for the PT and OT. She verbalized understanding and states nothing further needed.

## 2011-12-12 ENCOUNTER — Ambulatory Visit: Payer: Medicare Other | Admitting: Pulmonary Disease

## 2011-12-13 DIAGNOSIS — I872 Venous insufficiency (chronic) (peripheral): Secondary | ICD-10-CM | POA: Diagnosis not present

## 2011-12-13 DIAGNOSIS — F411 Generalized anxiety disorder: Secondary | ICD-10-CM | POA: Diagnosis not present

## 2011-12-13 DIAGNOSIS — M6281 Muscle weakness (generalized): Secondary | ICD-10-CM | POA: Diagnosis not present

## 2011-12-13 DIAGNOSIS — F0390 Unspecified dementia without behavioral disturbance: Secondary | ICD-10-CM | POA: Diagnosis not present

## 2011-12-13 DIAGNOSIS — R269 Unspecified abnormalities of gait and mobility: Secondary | ICD-10-CM | POA: Diagnosis not present

## 2011-12-13 DIAGNOSIS — I10 Essential (primary) hypertension: Secondary | ICD-10-CM | POA: Diagnosis not present

## 2011-12-16 DIAGNOSIS — F411 Generalized anxiety disorder: Secondary | ICD-10-CM | POA: Diagnosis not present

## 2011-12-16 DIAGNOSIS — I10 Essential (primary) hypertension: Secondary | ICD-10-CM | POA: Diagnosis not present

## 2011-12-16 DIAGNOSIS — R269 Unspecified abnormalities of gait and mobility: Secondary | ICD-10-CM | POA: Diagnosis not present

## 2011-12-16 DIAGNOSIS — I872 Venous insufficiency (chronic) (peripheral): Secondary | ICD-10-CM | POA: Diagnosis not present

## 2011-12-16 DIAGNOSIS — M6281 Muscle weakness (generalized): Secondary | ICD-10-CM | POA: Diagnosis not present

## 2011-12-16 DIAGNOSIS — F0390 Unspecified dementia without behavioral disturbance: Secondary | ICD-10-CM | POA: Diagnosis not present

## 2011-12-17 DIAGNOSIS — I872 Venous insufficiency (chronic) (peripheral): Secondary | ICD-10-CM | POA: Diagnosis not present

## 2011-12-17 DIAGNOSIS — F411 Generalized anxiety disorder: Secondary | ICD-10-CM | POA: Diagnosis not present

## 2011-12-17 DIAGNOSIS — F0390 Unspecified dementia without behavioral disturbance: Secondary | ICD-10-CM | POA: Diagnosis not present

## 2011-12-17 DIAGNOSIS — I10 Essential (primary) hypertension: Secondary | ICD-10-CM | POA: Diagnosis not present

## 2011-12-17 DIAGNOSIS — R269 Unspecified abnormalities of gait and mobility: Secondary | ICD-10-CM | POA: Diagnosis not present

## 2011-12-17 DIAGNOSIS — M6281 Muscle weakness (generalized): Secondary | ICD-10-CM | POA: Diagnosis not present

## 2011-12-18 ENCOUNTER — Ambulatory Visit (INDEPENDENT_AMBULATORY_CARE_PROVIDER_SITE_OTHER): Payer: Medicare Other | Admitting: Pulmonary Disease

## 2011-12-18 ENCOUNTER — Encounter: Payer: Self-pay | Admitting: Pulmonary Disease

## 2011-12-18 DIAGNOSIS — K219 Gastro-esophageal reflux disease without esophagitis: Secondary | ICD-10-CM | POA: Diagnosis not present

## 2011-12-18 DIAGNOSIS — R5383 Other fatigue: Secondary | ICD-10-CM

## 2011-12-18 DIAGNOSIS — F411 Generalized anxiety disorder: Secondary | ICD-10-CM

## 2011-12-18 DIAGNOSIS — I10 Essential (primary) hypertension: Secondary | ICD-10-CM

## 2011-12-18 DIAGNOSIS — K5730 Diverticulosis of large intestine without perforation or abscess without bleeding: Secondary | ICD-10-CM

## 2011-12-18 DIAGNOSIS — E7800 Pure hypercholesterolemia, unspecified: Secondary | ICD-10-CM | POA: Diagnosis not present

## 2011-12-18 DIAGNOSIS — R32 Unspecified urinary incontinence: Secondary | ICD-10-CM

## 2011-12-18 DIAGNOSIS — I872 Venous insufficiency (chronic) (peripheral): Secondary | ICD-10-CM | POA: Diagnosis not present

## 2011-12-18 DIAGNOSIS — M810 Age-related osteoporosis without current pathological fracture: Secondary | ICD-10-CM

## 2011-12-18 DIAGNOSIS — M1990 Unspecified osteoarthritis, unspecified site: Secondary | ICD-10-CM

## 2011-12-18 NOTE — Patient Instructions (Signed)
Today we updated your med list in our EPIC system...    Continue your current medications the same...  Stay as active as possible...    And stay SAFE...  Call for any questions...  Let's plan a follow up visit in about 3 month's time.Marland KitchenMarland Kitchen

## 2011-12-19 DIAGNOSIS — R269 Unspecified abnormalities of gait and mobility: Secondary | ICD-10-CM | POA: Diagnosis not present

## 2011-12-19 DIAGNOSIS — F0390 Unspecified dementia without behavioral disturbance: Secondary | ICD-10-CM | POA: Diagnosis not present

## 2011-12-19 DIAGNOSIS — I10 Essential (primary) hypertension: Secondary | ICD-10-CM | POA: Diagnosis not present

## 2011-12-19 DIAGNOSIS — I872 Venous insufficiency (chronic) (peripheral): Secondary | ICD-10-CM | POA: Diagnosis not present

## 2011-12-19 DIAGNOSIS — M6281 Muscle weakness (generalized): Secondary | ICD-10-CM | POA: Diagnosis not present

## 2011-12-19 DIAGNOSIS — F411 Generalized anxiety disorder: Secondary | ICD-10-CM | POA: Diagnosis not present

## 2011-12-20 DIAGNOSIS — R269 Unspecified abnormalities of gait and mobility: Secondary | ICD-10-CM | POA: Diagnosis not present

## 2011-12-20 DIAGNOSIS — M6281 Muscle weakness (generalized): Secondary | ICD-10-CM | POA: Diagnosis not present

## 2011-12-20 DIAGNOSIS — F0390 Unspecified dementia without behavioral disturbance: Secondary | ICD-10-CM | POA: Diagnosis not present

## 2011-12-20 DIAGNOSIS — I10 Essential (primary) hypertension: Secondary | ICD-10-CM | POA: Diagnosis not present

## 2011-12-20 DIAGNOSIS — F411 Generalized anxiety disorder: Secondary | ICD-10-CM | POA: Diagnosis not present

## 2011-12-20 DIAGNOSIS — I872 Venous insufficiency (chronic) (peripheral): Secondary | ICD-10-CM | POA: Diagnosis not present

## 2011-12-22 ENCOUNTER — Encounter: Payer: Self-pay | Admitting: Pulmonary Disease

## 2011-12-22 NOTE — Progress Notes (Signed)
Subjective:    Patient ID: Jessica [name], female    DOB: 01-14-1911, 78 y.o.   MRN: [id number]  HPI 78 y/o BF here for a follow up visit...  She has mult medical problems including:  AR & runny nose;  Asthmatic Bronchitis;  HBP;  Chronic VI & edema;  Hyperchol;  GERD & esoph stricture;  Divertics;  Hx Urinary incontinence & UTIs;  DJD/ Osteoporosis;  Semile Dementia;  Anxiety...  ~  December 26, 2010:  74mo ROV & clinically stable but she persists w/ mult somatic complaints:  hips> offered Ortho eval- "it's not that bad", has Mobic for Prn use;  runny nose: chr complaint, ?doesn't remember our numerous prev conversations- rec OTC Zyrtek;  "Jessica [name] needs his nerve pill"> Alpraz 0.25mg  written;  Jessica [name] wants labs done (ate strawberries & milk this AM)> routine labs all wnl, TChol is elev but HDL is 151!  ~  March 27, 2011:  74mo ROV & she's glad to be here because "things aren't doing to good"> c/o knees weak, knee pain (Mobic helps), noct leg discomfrt (Mobic helps this too), bladder trouble & she wants to see DrTannenbaum again as DrNesi hasn't been able to help her;  Wants to take Fish Oil- OK;  We reviewed her meds & her full labs from 1/12 w/ HDL= 151!!!  ~  May 30, 2011:  476mo ROV & she states "Oh, not doing so well"> c/o allergy & runny nose; feet & legs are burning & stinging (reviewed neuropathy w/ her & offered med but she declines meds at this time); wants more PT & balance training from the same [facility] therapist who comes to the house now for regular PT; wants power chair & I wrote Rx for [facility] to start the process; never went to see DrTannenbaum after last OV & she wants to resched this appt; finally she notes that Q64mo appts aren't satisfactory for her & she wants to be seen every 476months now...  ~  August 01, 2011:  476mo ROV & she reports that she's enjoyed the therapy, waiting for her power chair, & has appt w/ Urology next week...  She persists w/ mult somatic complaints> swelling in feet (edema is  actually down w/ Lasix 20mg - 2Qam & wt down 6# to 120# today); she notes that her nose runs "off & on for >67yr", legs achy & numb, back weak, wonders about allergy shots...  BP looks good; denies CP, palpit, syncope, ch in SOB, or cerebral ischemic symptoms...  She notes some constipation & we discussed Miralax & Senakot-S therapy...  She is really frustrated w/ Jessica [name]'s needs.  ~  October 03, 2011:  476mo ROV & here for face-to-face mobility eval per [facility] for her power wheelchair> forms completed & will be faxed to [facility] & scanned into the EMR... She states "Fair but not too good" today> notes that her legs are "nummy" & we discussed neuropathy & offered Gabapentin Rx but she doesn't want more pills;  C/o continued balance problems for which she had balance training which helped but she wants me to request MORE balance PT;  BP controlled, wt stable, persist LE edema L>R, etc...  She saw DrTannenbaum recently for Urology f/u w/ small hypersens bladder & large bladder diverticulum, not a surg candidate, taught "double voiding" tech; OK Flu vaccine, refills provided per request...  ~  December 18, 2011:  2-65mo ROV & she states "not doing too well" c/o weak (esp in AMs), "nummy" feeling in legs c/w neuropathy (  offer Gabapentin Rx & she will let me know), but notes that knees are better & the therapy is helping;  Wt is down 6# but she says appetite is good & she is eating well & resting well;  BP controlled on meds; On diet alone for Chol w/ VERY hi HDL; GI stable w/ Prilosec & Miralax/ Senakot-S; she saw DrTannenbaum for Urology 10/12- mult complaints, urodynamics done w/ sm hypersens bladder unstable w/ leakage, & large bladder divertic> he rec double voiding technique...        Problem List:  Hx of ALLERGIES (ICD-995.3) - she uses OTC meds as needed, & we prev discussed Zyrtek 10mg Qam, Saline nasal mist, & ATROVENT Nasal 0.03% Tid for drainage (one of her chronic complaints)...  Hx of ASTHMATIC BRONCHITIS, ACUTE  (ICD-466.0) - no recent exacerbations...  HYPERTENSION (ICD-401.9) - controlled on NORVASC 5mg , LISINOPRIL 20mg /d & FUROSEMIDE 20mg - 1-2 each AM... ~  4/12:  BP= 110/60 and she states that she is taking her meds regularly & tolerating them well... denies HA, visual changes, CP, palpit, change in dyspnea, etc... but notes weakness, intermittently dizzy & persist edema in her feet...  ~  6/12:  BP= 124/70 & she remains stable... ~  8/12:  BP= 110/ 66 & stable... ~  10/12:  BP= 114/62 & stable... ~  1/13:  BP= 110/64 & she remains stable...  VENOUS INSUFFICIENCY, CHRONIC (ICD-459.81) - there is mild pedal edema bilat L>R... on low sodium diet, elevation, support hose- prev refused diuretic meds due to urinary symptoms, now on LASIX 20mg - 1-2 Qam.  HYPERCHOLESTEROLEMIA (ICD-272.0) - prev on Lip10, but she stopped this on her own... she has a very high HDL!!! ~  FLP was 1/08 showed TChol 253, TG 125, HDL 121, LDL 93 ~  FLP 2/09 showed TChol 200, TG 99, HDL 136, LDL 44 ~  FLP 4/11 showed TChol 244, TG 143, HDL 149, LDL 59 ~  FLP 1/12 showed TChol 266, TG 102, HDL 151, LDL 83 >> BEST HDL I'VE EVER SEEN  GERD (AVW-098.11) - last EGD was 9/05 by DrPerry showing GERD and esoph stricture- dilated... she stopped her Nexium but states that her swallowing is good & she uses PRILOSEC OTC as needed.  DIVERTICULOSIS OF COLON (ICD-562.10) - last colonoscopy was 11/00 showing divertics only... ~  8/12:  She notes some constipation & rec to take [facility] & SENAKOT-S per our usual protocol...  HX, URINARY INFECTION (ICD-V13.02) & INCONTINENCE symptoms... she has seen DrTannenbaum & staff on bladder exercises and she reported some improvement but has persistant symptoms & never followed up... ~  7/10:  offered f/u appt w/ Urology vs second opinion consult but she declines... ~  9/10:  wrote for trial Enablex 7.5mg /d >> no benefit she says. ~  12/10: saw DrNesi- tried sample med, sl improved, but "he turned me over  to a lady for longer term treatments" she says... ~  4/12 & 6/12: states DrNesi hasn't been able to help her & she wants appt w/ DrTannenbaum==> seen w/ urodynamic eval revealing a sm hypersens bladder & a large bladder divertic; not a surg candidate, taught double voiding technique... ~  10/12: she had f/u DrTannenbaum & he reinforced prev w/u & need for double voiding technique...  DEGENERATIVE JOINT DISEASE (ICD-715.90) - notes right shoulder symptoms, but managing OK w/ MOBIC 7.5mg  & Tylenol; also right hip & low back pain...  she also c/o neuropathic "burning" in legs but not bad enough for additional meds she says.Marland KitchenMarland Kitchen  OSTEOPOROSIS (ICD-733.00) - she was on Fosamax but had discontinued this on her own... we discussed the need for continued therapy and she was asked to restart Alendronate but she never did...  she takes Ca++, MVI, VitD...  SENILE DEMENTIA (ICD-290.0) - she takes ASA 81mg /d... she tried Aricept in the past but didn't notice any improvement therefore stopped it. ~  she & her husb still live on their own but are having numerous difficulties while still resisting any thoughts of assisted living etc> got concerned because she saw an impulse in her left antecubital fossa (tortuous brachial art), went to the ER & this got translated to palpitations & lead to full ER cardiac eval= neg... ~  6/11: MMSE showed 26/30 & she agreed to try DONEPEZIL 10mg /d... ~  6/12:  Still taking Aricept & appears stable...  ANXIETY (ICD-300.00) - she uses Alpraz 0.25mg  as needed.  Hx of SHINGLES (ICD-053.9)   Past Surgical History  Procedure Date  . Vesicovaginal fistula closure w/ tah   . Tonsillectomy   . Abdominal hysterectomy     Outpatient Encounter Prescriptions as of 12/18/2011  Medication Sig Dispense Refill  . ALPRAZolam (XANAX) 0.25 MG tablet 1/2 -1 tablet by mouth three times a day as needed for nerves  90 tablet  5  . amLODipine (NORVASC) 5 MG tablet Take 1 tablet (5 mg total) by mouth  daily.  90 tablet  3  . aspirin 81 MG tablet Take 81 mg by mouth daily.        . Calcium Carbonate-Vitamin D 600-400 MG-UNIT per tablet Take 1 tablet by mouth 2 (two) times daily.        . Cholecalciferol (VITAMIN D) 1000 UNITS capsule Take 1,000 Units by mouth daily.        Marland Kitchen donepezil (ARICEPT) 10 MG tablet Take 1 tablet (10 mg total) by mouth daily.  30 tablet  5  . furosemide (LASIX) 20 MG tablet 1-2 tabs daily for swelling  60 tablet  11  . lisinopril (PRINIVIL,ZESTRIL) 20 MG tablet Take 1 tablet (20 mg total) by mouth daily.  90 tablet  3  . Multiple Vitamin (MULTIVITAMIN) tablet Take 1 tablet by mouth daily.        . meloxicam (MOBIC) 7.5 MG tablet 1 by mouth once daily w/ food as needed for joint/back pain         No Known Allergies   Current Medications, Allergies, Past Medical History, Past Surgical History, Family History, and Social History were reviewed in [employer] record.    Review of Systems        See HPI - all other systems neg except as noted...      The patient complains of decreased hearing, dyspnea on exertion, peripheral edema, incontinence, muscle weakness, and difficulty walking.  The patient denies anorexia, fever, weight loss, weight gain, vision loss, hoarseness, chest pain, syncope, prolonged cough, headaches, hemoptysis, abdominal pain, melena, hematochezia, severe indigestion/heartburn, hematuria, suspicious skin lesions, transient blindness, depression, unusual weight change, abnormal bleeding, enlarged lymph nodes, and angioedema.     Objective:   Physical Exam      WD, WN, 78 y/o BF in NAD... GENERAL:  Alert, pleasant & cooperative; she is slow moving as noted... HEENT:  /AT, EOM-full, EACs- some wax, TMs-wnl, NOSE-clear, THROAT-clear & wnl. NECK:  Supple w/ fairROM; no JVD; normal carotid impulses w/o bruits; no thyromegaly or nodules palpated; no lymphadenopathy. CHEST:  Clear to P & A; without wheezes/ rales/ or rhonchi.,  noted kyphosis... HEART:  Regular Rhythm; without murmurs/ rubs/ or gallops heard... ABDOMEN:  Soft & nontender; normal bowel sounds; no organomegaly or masses detected. EXT:  mod arthritic changes; no varicose veins/ +venous insuffic/ 1-2+edema... NEURO:   Alert, answers questions appropriately, but slowly, no focal neuro deficits... DERM:  No lesions noted; no rash etc...  MMSE> 06/13/10> Score 26/30 (missed place, 2/3 recall after distraction, & copy figure)...  REVIEW OF WEIGHTS: Jun09 to Jun10 - weights varied betw 123-129# Jun10 weight= 123# w/ 1+ edema reported. Jul10 weight= 117# w/ 1+ edema present. Sep10 weight= 122# w/ mod pedal edema. Jan11 weight = 119# w/ min edema. Jun11 weight = 123# w/ persist edema. EAV40 weight = 118# w/ min swelling Apr12 weight = 114#... rec to add nutritional supplement daily. JWJ19 weight = 126#... She has mild pedal edema & advised to inc Lasix20 to 2Qam. Aug12 weight = 120#... Decreased edema. JYN82 weight = 116#   Assessment & Plan:   AR & ASTHMA>  Breathing is stable, at baseline, she has chr rhinorrhea complaints w/ numerous discussions regarding management of this chronic problem....  HBP>  Controlled on meds, continue same...  Ven insuffic>  Reminded of sodium restriction etc; asked to keep her Lasix20 at 2 Qam for swelling...  CHOL>  She has the world's best HDL!!!  GU>  She saw DrTannenbaum w/ extensive investigation into her voiding symptoms & urodynamics as above; rec to use "double voiding" technique...  DJD/ Osteopenia>  Aware, she is stable on OTC meds, she has repeatedly refuse Bisphos meds for her bones...  Dementia>  She & husb Jessica [name] are still living independently & have been married 62 yrs.  Other medical problems as noted.Marland KitchenMarland Kitchen

## 2011-12-23 DIAGNOSIS — F411 Generalized anxiety disorder: Secondary | ICD-10-CM | POA: Diagnosis not present

## 2011-12-23 DIAGNOSIS — R269 Unspecified abnormalities of gait and mobility: Secondary | ICD-10-CM | POA: Diagnosis not present

## 2011-12-23 DIAGNOSIS — M6281 Muscle weakness (generalized): Secondary | ICD-10-CM | POA: Diagnosis not present

## 2011-12-23 DIAGNOSIS — I10 Essential (primary) hypertension: Secondary | ICD-10-CM | POA: Diagnosis not present

## 2011-12-23 DIAGNOSIS — F0390 Unspecified dementia without behavioral disturbance: Secondary | ICD-10-CM | POA: Diagnosis not present

## 2011-12-23 DIAGNOSIS — I872 Venous insufficiency (chronic) (peripheral): Secondary | ICD-10-CM | POA: Diagnosis not present

## 2011-12-25 DIAGNOSIS — F411 Generalized anxiety disorder: Secondary | ICD-10-CM | POA: Diagnosis not present

## 2011-12-25 DIAGNOSIS — I872 Venous insufficiency (chronic) (peripheral): Secondary | ICD-10-CM | POA: Diagnosis not present

## 2011-12-25 DIAGNOSIS — M6281 Muscle weakness (generalized): Secondary | ICD-10-CM | POA: Diagnosis not present

## 2011-12-25 DIAGNOSIS — I10 Essential (primary) hypertension: Secondary | ICD-10-CM | POA: Diagnosis not present

## 2011-12-25 DIAGNOSIS — F0390 Unspecified dementia without behavioral disturbance: Secondary | ICD-10-CM | POA: Diagnosis not present

## 2011-12-25 DIAGNOSIS — R269 Unspecified abnormalities of gait and mobility: Secondary | ICD-10-CM | POA: Diagnosis not present

## 2011-12-27 DIAGNOSIS — I10 Essential (primary) hypertension: Secondary | ICD-10-CM | POA: Diagnosis not present

## 2011-12-27 DIAGNOSIS — F411 Generalized anxiety disorder: Secondary | ICD-10-CM | POA: Diagnosis not present

## 2011-12-27 DIAGNOSIS — F0390 Unspecified dementia without behavioral disturbance: Secondary | ICD-10-CM | POA: Diagnosis not present

## 2011-12-27 DIAGNOSIS — M6281 Muscle weakness (generalized): Secondary | ICD-10-CM | POA: Diagnosis not present

## 2011-12-27 DIAGNOSIS — R269 Unspecified abnormalities of gait and mobility: Secondary | ICD-10-CM | POA: Diagnosis not present

## 2011-12-27 DIAGNOSIS — I872 Venous insufficiency (chronic) (peripheral): Secondary | ICD-10-CM | POA: Diagnosis not present

## 2011-12-30 ENCOUNTER — Telehealth: Payer: Self-pay | Admitting: Pulmonary Disease

## 2011-12-30 DIAGNOSIS — F0390 Unspecified dementia without behavioral disturbance: Secondary | ICD-10-CM | POA: Diagnosis not present

## 2011-12-30 DIAGNOSIS — R269 Unspecified abnormalities of gait and mobility: Secondary | ICD-10-CM | POA: Diagnosis not present

## 2011-12-30 DIAGNOSIS — M6281 Muscle weakness (generalized): Secondary | ICD-10-CM | POA: Diagnosis not present

## 2011-12-30 DIAGNOSIS — F411 Generalized anxiety disorder: Secondary | ICD-10-CM | POA: Diagnosis not present

## 2011-12-30 DIAGNOSIS — I872 Venous insufficiency (chronic) (peripheral): Secondary | ICD-10-CM | POA: Diagnosis not present

## 2011-12-30 DIAGNOSIS — I10 Essential (primary) hypertension: Secondary | ICD-10-CM | POA: Diagnosis not present

## 2011-12-30 NOTE — Telephone Encounter (Signed)
ATC NA phone rang approximately 15 times. WCB

## 2011-12-31 DIAGNOSIS — R269 Unspecified abnormalities of gait and mobility: Secondary | ICD-10-CM | POA: Diagnosis not present

## 2011-12-31 DIAGNOSIS — F0390 Unspecified dementia without behavioral disturbance: Secondary | ICD-10-CM | POA: Diagnosis not present

## 2011-12-31 DIAGNOSIS — M6281 Muscle weakness (generalized): Secondary | ICD-10-CM | POA: Diagnosis not present

## 2011-12-31 DIAGNOSIS — I10 Essential (primary) hypertension: Secondary | ICD-10-CM | POA: Diagnosis not present

## 2011-12-31 DIAGNOSIS — F411 Generalized anxiety disorder: Secondary | ICD-10-CM | POA: Diagnosis not present

## 2011-12-31 DIAGNOSIS — I872 Venous insufficiency (chronic) (peripheral): Secondary | ICD-10-CM | POA: Diagnosis not present

## 2011-12-31 NOTE — Telephone Encounter (Signed)
ATC NA and no option to leave a msg 

## 2012-01-01 NOTE — Telephone Encounter (Signed)
Spoke with pt and she states that a few days ago when she took her aricept it caused her whole face to swell. No swelling in her throat or anywhere else. No other s/e. She states that she stopped taking med until early this am, and her face was swollen some, but only in her cheeks and very mild swelling. She wants to know if should stop med all together. Please advise, thanks!

## 2012-01-01 NOTE — Telephone Encounter (Signed)
Per SN---recs to decrease the donepezil to 1/2 tablet for a week or so then increase back up to 1 tablet daily.  thanks

## 2012-01-01 NOTE — Telephone Encounter (Signed)
Spoke with patient-aware of recs from SN.

## 2012-01-02 DIAGNOSIS — F0390 Unspecified dementia without behavioral disturbance: Secondary | ICD-10-CM | POA: Diagnosis not present

## 2012-01-02 DIAGNOSIS — I10 Essential (primary) hypertension: Secondary | ICD-10-CM | POA: Diagnosis not present

## 2012-01-02 DIAGNOSIS — M6281 Muscle weakness (generalized): Secondary | ICD-10-CM | POA: Diagnosis not present

## 2012-01-02 DIAGNOSIS — R269 Unspecified abnormalities of gait and mobility: Secondary | ICD-10-CM | POA: Diagnosis not present

## 2012-01-02 DIAGNOSIS — F411 Generalized anxiety disorder: Secondary | ICD-10-CM | POA: Diagnosis not present

## 2012-01-02 DIAGNOSIS — I872 Venous insufficiency (chronic) (peripheral): Secondary | ICD-10-CM | POA: Diagnosis not present

## 2012-01-06 DIAGNOSIS — I10 Essential (primary) hypertension: Secondary | ICD-10-CM | POA: Diagnosis not present

## 2012-01-06 DIAGNOSIS — F411 Generalized anxiety disorder: Secondary | ICD-10-CM | POA: Diagnosis not present

## 2012-01-06 DIAGNOSIS — R269 Unspecified abnormalities of gait and mobility: Secondary | ICD-10-CM | POA: Diagnosis not present

## 2012-01-06 DIAGNOSIS — I872 Venous insufficiency (chronic) (peripheral): Secondary | ICD-10-CM | POA: Diagnosis not present

## 2012-01-06 DIAGNOSIS — F0390 Unspecified dementia without behavioral disturbance: Secondary | ICD-10-CM | POA: Diagnosis not present

## 2012-01-06 DIAGNOSIS — M6281 Muscle weakness (generalized): Secondary | ICD-10-CM | POA: Diagnosis not present

## 2012-01-08 DIAGNOSIS — M6281 Muscle weakness (generalized): Secondary | ICD-10-CM | POA: Diagnosis not present

## 2012-01-08 DIAGNOSIS — I10 Essential (primary) hypertension: Secondary | ICD-10-CM | POA: Diagnosis not present

## 2012-01-08 DIAGNOSIS — F411 Generalized anxiety disorder: Secondary | ICD-10-CM | POA: Diagnosis not present

## 2012-01-08 DIAGNOSIS — I872 Venous insufficiency (chronic) (peripheral): Secondary | ICD-10-CM | POA: Diagnosis not present

## 2012-01-08 DIAGNOSIS — F0390 Unspecified dementia without behavioral disturbance: Secondary | ICD-10-CM | POA: Diagnosis not present

## 2012-01-08 DIAGNOSIS — R269 Unspecified abnormalities of gait and mobility: Secondary | ICD-10-CM | POA: Diagnosis not present

## 2012-01-09 DIAGNOSIS — F411 Generalized anxiety disorder: Secondary | ICD-10-CM | POA: Diagnosis not present

## 2012-01-09 DIAGNOSIS — I872 Venous insufficiency (chronic) (peripheral): Secondary | ICD-10-CM | POA: Diagnosis not present

## 2012-01-09 DIAGNOSIS — I10 Essential (primary) hypertension: Secondary | ICD-10-CM | POA: Diagnosis not present

## 2012-01-09 DIAGNOSIS — F0390 Unspecified dementia without behavioral disturbance: Secondary | ICD-10-CM | POA: Diagnosis not present

## 2012-01-09 DIAGNOSIS — M6281 Muscle weakness (generalized): Secondary | ICD-10-CM | POA: Diagnosis not present

## 2012-01-09 DIAGNOSIS — R269 Unspecified abnormalities of gait and mobility: Secondary | ICD-10-CM | POA: Diagnosis not present

## 2012-01-17 ENCOUNTER — Telehealth: Payer: Self-pay | Admitting: Pulmonary Disease

## 2012-01-17 DIAGNOSIS — R269 Unspecified abnormalities of gait and mobility: Secondary | ICD-10-CM

## 2012-01-17 NOTE — Telephone Encounter (Signed)
I spoke with pt and she is wanting to know if we can send an order to [facility] to get her help with her balance. Pt states she can barely walk. i advised per leigh okay to send order for PT through [facility]. Referral has been placed and pt is aware

## 2012-01-21 DIAGNOSIS — R350 Frequency of micturition: Secondary | ICD-10-CM | POA: Diagnosis not present

## 2012-01-21 DIAGNOSIS — R351 Nocturia: Secondary | ICD-10-CM | POA: Diagnosis not present

## 2012-01-21 DIAGNOSIS — R3915 Urgency of urination: Secondary | ICD-10-CM | POA: Diagnosis not present

## 2012-01-22 DIAGNOSIS — F0390 Unspecified dementia without behavioral disturbance: Secondary | ICD-10-CM | POA: Diagnosis not present

## 2012-01-22 DIAGNOSIS — F411 Generalized anxiety disorder: Secondary | ICD-10-CM | POA: Diagnosis not present

## 2012-01-22 DIAGNOSIS — I10 Essential (primary) hypertension: Secondary | ICD-10-CM | POA: Diagnosis not present

## 2012-01-22 DIAGNOSIS — I872 Venous insufficiency (chronic) (peripheral): Secondary | ICD-10-CM | POA: Diagnosis not present

## 2012-01-22 DIAGNOSIS — R269 Unspecified abnormalities of gait and mobility: Secondary | ICD-10-CM | POA: Diagnosis not present

## 2012-01-22 DIAGNOSIS — M6281 Muscle weakness (generalized): Secondary | ICD-10-CM | POA: Diagnosis not present

## 2012-01-23 DIAGNOSIS — M79609 Pain in unspecified limb: Secondary | ICD-10-CM | POA: Diagnosis not present

## 2012-01-23 DIAGNOSIS — B351 Tinea unguium: Secondary | ICD-10-CM | POA: Diagnosis not present

## 2012-01-28 DIAGNOSIS — F0390 Unspecified dementia without behavioral disturbance: Secondary | ICD-10-CM | POA: Diagnosis not present

## 2012-01-28 DIAGNOSIS — I10 Essential (primary) hypertension: Secondary | ICD-10-CM | POA: Diagnosis not present

## 2012-01-28 DIAGNOSIS — R269 Unspecified abnormalities of gait and mobility: Secondary | ICD-10-CM | POA: Diagnosis not present

## 2012-01-28 DIAGNOSIS — F411 Generalized anxiety disorder: Secondary | ICD-10-CM | POA: Diagnosis not present

## 2012-01-28 DIAGNOSIS — M6281 Muscle weakness (generalized): Secondary | ICD-10-CM | POA: Diagnosis not present

## 2012-01-28 DIAGNOSIS — I872 Venous insufficiency (chronic) (peripheral): Secondary | ICD-10-CM | POA: Diagnosis not present

## 2012-01-30 DIAGNOSIS — F0390 Unspecified dementia without behavioral disturbance: Secondary | ICD-10-CM | POA: Diagnosis not present

## 2012-01-30 DIAGNOSIS — I872 Venous insufficiency (chronic) (peripheral): Secondary | ICD-10-CM | POA: Diagnosis not present

## 2012-01-30 DIAGNOSIS — I10 Essential (primary) hypertension: Secondary | ICD-10-CM | POA: Diagnosis not present

## 2012-01-30 DIAGNOSIS — F411 Generalized anxiety disorder: Secondary | ICD-10-CM | POA: Diagnosis not present

## 2012-01-30 DIAGNOSIS — R269 Unspecified abnormalities of gait and mobility: Secondary | ICD-10-CM | POA: Diagnosis not present

## 2012-01-30 DIAGNOSIS — M6281 Muscle weakness (generalized): Secondary | ICD-10-CM | POA: Diagnosis not present

## 2012-01-31 DIAGNOSIS — I10 Essential (primary) hypertension: Secondary | ICD-10-CM | POA: Diagnosis not present

## 2012-01-31 DIAGNOSIS — F0390 Unspecified dementia without behavioral disturbance: Secondary | ICD-10-CM | POA: Diagnosis not present

## 2012-01-31 DIAGNOSIS — M6281 Muscle weakness (generalized): Secondary | ICD-10-CM | POA: Diagnosis not present

## 2012-01-31 DIAGNOSIS — R269 Unspecified abnormalities of gait and mobility: Secondary | ICD-10-CM | POA: Diagnosis not present

## 2012-01-31 DIAGNOSIS — F411 Generalized anxiety disorder: Secondary | ICD-10-CM | POA: Diagnosis not present

## 2012-01-31 DIAGNOSIS — I872 Venous insufficiency (chronic) (peripheral): Secondary | ICD-10-CM | POA: Diagnosis not present

## 2012-02-03 DIAGNOSIS — R269 Unspecified abnormalities of gait and mobility: Secondary | ICD-10-CM | POA: Diagnosis not present

## 2012-02-03 DIAGNOSIS — M6281 Muscle weakness (generalized): Secondary | ICD-10-CM | POA: Diagnosis not present

## 2012-02-03 DIAGNOSIS — F0390 Unspecified dementia without behavioral disturbance: Secondary | ICD-10-CM | POA: Diagnosis not present

## 2012-02-03 DIAGNOSIS — F411 Generalized anxiety disorder: Secondary | ICD-10-CM | POA: Diagnosis not present

## 2012-02-03 DIAGNOSIS — I872 Venous insufficiency (chronic) (peripheral): Secondary | ICD-10-CM | POA: Diagnosis not present

## 2012-02-03 DIAGNOSIS — I10 Essential (primary) hypertension: Secondary | ICD-10-CM | POA: Diagnosis not present

## 2012-02-19 ENCOUNTER — Ambulatory Visit: Payer: Medicare Other | Admitting: Pulmonary Disease

## 2012-02-24 DIAGNOSIS — F0390 Unspecified dementia without behavioral disturbance: Secondary | ICD-10-CM

## 2012-02-24 DIAGNOSIS — I872 Venous insufficiency (chronic) (peripheral): Secondary | ICD-10-CM | POA: Diagnosis not present

## 2012-02-24 DIAGNOSIS — M6281 Muscle weakness (generalized): Secondary | ICD-10-CM | POA: Diagnosis not present

## 2012-02-24 DIAGNOSIS — R269 Unspecified abnormalities of gait and mobility: Secondary | ICD-10-CM | POA: Diagnosis not present

## 2012-02-26 ENCOUNTER — Ambulatory Visit: Payer: Medicare Other | Admitting: Adult Health

## 2012-02-27 ENCOUNTER — Encounter: Payer: Self-pay | Admitting: Adult Health

## 2012-02-27 ENCOUNTER — Ambulatory Visit (INDEPENDENT_AMBULATORY_CARE_PROVIDER_SITE_OTHER): Payer: Medicare Other | Admitting: Adult Health

## 2012-02-27 ENCOUNTER — Other Ambulatory Visit (INDEPENDENT_AMBULATORY_CARE_PROVIDER_SITE_OTHER): Payer: Medicare Other

## 2012-02-27 VITALS — BP 108/64 | HR 55 | Temp 97.7°F | Ht 64.0 in | Wt 120.8 lb

## 2012-02-27 DIAGNOSIS — F411 Generalized anxiety disorder: Secondary | ICD-10-CM

## 2012-02-27 DIAGNOSIS — M810 Age-related osteoporosis without current pathological fracture: Secondary | ICD-10-CM

## 2012-02-27 DIAGNOSIS — R0609 Other forms of dyspnea: Secondary | ICD-10-CM

## 2012-02-27 DIAGNOSIS — I872 Venous insufficiency (chronic) (peripheral): Secondary | ICD-10-CM

## 2012-02-27 DIAGNOSIS — R0600 Dyspnea, unspecified: Secondary | ICD-10-CM

## 2012-02-27 DIAGNOSIS — R609 Edema, unspecified: Secondary | ICD-10-CM | POA: Diagnosis not present

## 2012-02-27 DIAGNOSIS — K5730 Diverticulosis of large intestine without perforation or abscess without bleeding: Secondary | ICD-10-CM | POA: Diagnosis not present

## 2012-02-27 DIAGNOSIS — I10 Essential (primary) hypertension: Secondary | ICD-10-CM

## 2012-02-27 DIAGNOSIS — R0989 Other specified symptoms and signs involving the circulatory and respiratory systems: Secondary | ICD-10-CM | POA: Diagnosis not present

## 2012-02-27 DIAGNOSIS — M1990 Unspecified osteoarthritis, unspecified site: Secondary | ICD-10-CM

## 2012-02-27 LAB — CBC WITH DIFFERENTIAL/PLATELET
Basophils Absolute: 0 10*3/uL (ref 0.0–0.1)
HCT: 41 % (ref 36.0–46.0)
Lymphs Abs: 1.7 10*3/uL (ref 0.7–4.0)
MCV: 87 fl (ref 78.0–100.0)
Monocytes Absolute: 0.5 10*3/uL (ref 0.1–1.0)
Platelets: 204 10*3/uL (ref 150.0–400.0)
RDW: 14.4 % (ref 11.5–14.6)

## 2012-02-27 LAB — BASIC METABOLIC PANEL
BUN: 16 mg/dL (ref 6–23)
Chloride: 101 mEq/L (ref 96–112)
Creatinine, Ser: 0.9 mg/dL (ref 0.4–1.2)

## 2012-02-27 LAB — BRAIN NATRIURETIC PEPTIDE: Pro B Natriuretic peptide (BNP): 101 pg/mL — ABNORMAL HIGH (ref 0.0–100.0)

## 2012-02-27 LAB — TSH: TSH: 1.63 u[IU]/mL (ref 0.35–5.50)

## 2012-02-27 NOTE — Assessment & Plan Note (Signed)
lower back weakness with no notable pain, trauma or injury degenerative joint changes with kyphosis and scoliosis  Calcium Carbonate-Vitamin D supplement   Plan:  Continue calcium and vitamin D daily supplements  May use Mobic daily as needed for joint/back pain   

## 2012-02-27 NOTE — Assessment & Plan Note (Addendum)
Mild BLE edema, more likely chronic in nature. Will check labs today to explore any other cause for swelling.    Continue on As needed  Lasi  Plan:  Labs: BNP, BMET, CBC with diff, TSH  Low salt diet Keep legs elevated.  May use Lasix daily As needed For swelling

## 2012-02-27 NOTE — Progress Notes (Signed)
Subjective:    Patient ID: Jessica [name], female    DOB: 03-10-1911, 78 y.o.   MRN: [id number]  HPI 78 y/o BF with known hx of -  AR & runny nose;  Asthmatic Bronchitis;  HBP;  Chronic VI & edema;  Hyperchol;  GERD & esoph stricture;  Divertics;  Hx Urinary incontinence & UTIs;  DJD/ Osteoporosis;  Semile Dementia;  Anxiety...  ~  December 26, 2010:  544mo ROV & clinically stable but she persists w/ mult somatic complaints:  hips> offered Ortho eval- "it's not that bad", has Mobic for Prn use;  runny nose: chr complaint, ?doesn't remember our numerous prev conversations- rec OTC Zyrtek;  "Jessica [name] needs his nerve pill"> Alpraz 0.25mg  written;  Jessica [name] wants labs done (ate strawberries & milk this AM)> routine labs all wnl, TChol is elev but HDL is 151!  ~  March 27, 2011:  544mo ROV & she's glad to be here because "things aren't doing to good"> c/o knees weak, knee pain (Mobic helps), noct leg discomfrt (Mobic helps this too), bladder trouble & she wants to see Jessica [name] again as Jessica [name] hasn't been able to help her;  Wants to take Fish Oil- OK;  We reviewed her meds & her full labs from 1/12 w/ HDL= 151!!!  ~  May 30, 2011:  44mo ROV & she states "Oh, not doing so well"> c/o allergy & runny nose; feet & legs are burning & stinging (reviewed neuropathy w/ her & offered med but she declines meds at this time); wants more PT & balance training from the same Jessica [name] therapist who comes to the house now for regular PT; wants power chair & I wrote Rx for Jessica [name] to start the process; never went to see Jessica [name] after last OV & she wants to resched this appt; finally she notes that Q1mo appts aren't satisfactory for her & she wants to be seen every 44months now...  ~  August 01, 2011:  44mo ROV & she reports that she's enjoyed the therapy, waiting for her power chair, & has appt w/ Jessica [name] next week...  She persists w/ mult somatic complaints> swelling in feet (edema is actually down w/ Lasix 20mg - 2Qam & wt down 6# to  120# today); she notes that her nose runs "off & on for >52yr", legs achy & numb, back weak, wonders about allergy shots...  BP looks good; denies CP, palpit, syncope, ch in SOB, or cerebral ischemic symptoms...  She notes some constipation & we discussed Miralax & Senakot-S therapy...  She is really frustrated w/ Jessica [name]'s needs.  ~  October 03, 2011:  44mo ROV & here for face-to-face mobility eval per Jessica [name] for her power wheelchair> forms completed & will be faxed to Jessica [name] & scanned into the EMR... She states "Fair but not too good" today> notes that her legs are "nummy" & we discussed neuropathy & offered Gabapentin Rx but she doesn't want more pills;  C/o continued balance problems for which she had balance training which helped but she wants me to request MORE balance PT;  BP controlled, wt stable, persist LE edema L>R, etc...  She saw Jessica [name] recently for Jessica [name] f/u w/ small hypersens bladder & large bladder diverticulum, not a surg candidate, taught "double voiding" tech; OK Flu vaccine, refills provided per request...  ~  December 18, 2011:  2-344mo ROV & she states "not doing too well" c/o weak (esp in AMs), "nummy" feeling in legs c/w neuropathy (offer Gabapentin Rx & she will let me  know), but notes that knees are better & the therapy is helping;  Wt is down 6# but she says appetite is good & she is eating well & resting well;  BP controlled on meds; On diet alone for Chol w/ VERY hi HDL; GI stable w/ Prilosec & Miralax/ Senakot-S; she saw Jessica [name] for Jessica [name] 10/12- mult complaints, urodynamics done w/ sm hypersens bladder unstable w/ leakage, & large bladder divertic> he rec double voiding technique...  ~02/27/2012 Acute OV  Presents with complaints of leg and knee swelling that worsens throughout the day. Denies any knee, leg, calf or ankle pain. Describes as a tingling sensation. Reports a lot of standing and walking during the day as she helps to care for her husband who is chronically ill. Takes  Lasix 20 mg every morning and wears support hoses. To note, this is a chronic issue for her and has presented here several times in the past with complaints of the same. No chest pain, shortness of breath, activity intolerance, dizziness or fevers. Appetite is good and reports eating well. Also has complaints of back "weakness." denies any trauma, injuries, excessive lifting or pulling. Has prescribed Mobic as needed  for joint/back pain but doesn't use because she has no back or joint pain..."just weak." No bowel incontinence or changes in urinary patterns.        Problem List:  Hx of ALLERGIES (ICD-995.3) - she uses OTC meds as needed, & we prev discussed Zyrtek 10mg Qam, Saline nasal mist, & ATROVENT Nasal 0.03% Tid for drainage (one of her chronic complaints)...  Hx of ASTHMATIC BRONCHITIS, ACUTE (ICD-466.0) - no recent exacerbations...  HYPERTENSION (ICD-401.9) - controlled on NORVASC 5mg , LISINOPRIL 20mg /d & FUROSEMIDE 20mg - 1-2 each AM... ~  4/12:  BP= 110/60 and she states that she is taking her meds regularly & tolerating them well... denies HA, visual changes, CP, palpit, change in dyspnea, etc... but notes weakness, intermittently dizzy & persist edema in her feet...  ~  6/12:  BP= 124/70 & she remains stable... ~  8/12:  BP= 110/ 66 & stable... ~  10/12:  BP= 114/62 & stable... ~  1/13:  BP= 110/64 & she remains stable...  VENOUS INSUFFICIENCY, CHRONIC (ICD-459.81) - there is mild pedal edema bilat L>R... on low sodium diet, elevation, support hose- prev refused diuretic meds due to urinary symptoms, now on LASIX 20mg - 1-2 Qam.  HYPERCHOLESTEROLEMIA (ICD-272.0) - prev on Lip10, but she stopped this on her own... she has a very high HDL!!! ~  FLP was 1/08 showed TChol 253, TG 125, HDL 121, LDL 93 ~  FLP 2/09 showed TChol 200, TG 99, HDL 136, LDL 44 ~  FLP 4/11 showed TChol 244, TG 143, HDL 149, LDL 59 ~  FLP 1/12 showed TChol 266, TG 102, HDL 151, LDL 83 >> BEST HDL I'VE EVER  SEEN  GERD (ICD-530.81) - last EGD was 9/05 by DrPerry showing GERD and esoph stricture- dilated... she stopped her Nexium but states that her swallowing is good & she uses PRILOSEC OTC as needed.  DIVERTICULOSIS OF COLON (ICD-562.10) - last colonoscopy was 11/00 showing divertics only... ~  8/12:  She notes some constipation & rec to take [facility] & SENAKOT-S per our usual protocol...  HX, URINARY INFECTION (ICD-V13.02) & INCONTINENCE symptoms... she has seen Jessica [name] & staff on bladder exercises and she reported some improvement but has persistant symptoms & never followed up... ~  7/10:  offered f/u appt w/ Jessica [name] vs second opinion consult but she declines... ~  9/10:  wrote for trial Enablex 7.5mg /d >> no benefit she says. ~  12/10: saw Jessica [name]- tried sample med, sl improved, but "he turned me over to a lady for longer term treatments" she says... ~  4/12 & 6/12: states Jessica [name] hasn't been able to help her & she wants appt w/ Jessica [name]==> seen w/ urodynamic eval revealing a sm hypersens bladder & a large bladder divertic; not a surg candidate, taught double voiding technique... ~  10/12: she had f/u Jessica [name] & he reinforced prev w/u & need for double voiding technique...  DEGENERATIVE JOINT DISEASE (ICD-715.90) - notes right shoulder symptoms, but managing OK w/ MOBIC 7.5mg  & Tylenol; also right hip & low back pain...  she also c/o neuropathic "burning" in legs but not bad enough for additional meds she says...  OSTEOPOROSIS (ICD-733.00) - she was on Fosamax but had discontinued this on her own... we discussed the need for continued therapy and she was asked to restart Alendronate but she never did...  she takes Ca++, MVI, VitD...  SENILE DEMENTIA (ICD-290.0) - she takes ASA 81mg /d... she tried Aricept in the past but didn't notice any improvement therefore stopped it. ~  she & her husb still live on their own but are having numerous difficulties while still resisting any thoughts of  assisted living etc> got concerned because she saw an impulse in her left antecubital fossa (tortuous brachial art), went to the ER & this got translated to palpitations & lead to full ER cardiac eval= neg... ~  6/11: MMSE showed 26/30 & she agreed to try DONEPEZIL 10mg /d... ~  6/12:  Still taking Aricept & appears stable...  ANXIETY (ICD-300.00) - she uses Alpraz 0.25mg  as needed.  Hx of SHINGLES (ICD-053.9)   Past Surgical History  Procedure Date  . Vesicovaginal fistula closure w/ tah   . Tonsillectomy   . Abdominal hysterectomy     Outpatient Encounter Prescriptions as of 02/27/2012  Medication Sig Dispense Refill  . ALPRAZolam (XANAX) 0.25 MG tablet 1/2 -1 tablet by mouth three times a day as needed for nerves  90 tablet  5  . amLODipine (NORVASC) 5 MG tablet Take 1 tablet (5 mg total) by mouth daily.  90 tablet  3  . aspirin 81 MG tablet Take 81 mg by mouth daily.        . Calcium Carbonate-Vitamin D 600-400 MG-UNIT per tablet Take 1 tablet by mouth 2 (two) times daily.        . Cholecalciferol (VITAMIN D) 1000 UNITS capsule Take 1,000 Units by mouth daily.        Marland Kitchen donepezil (ARICEPT) 10 MG tablet Take 1 tablet (10 mg total) by mouth daily.  30 tablet  5  . furosemide (LASIX) 20 MG tablet 1-2 tabs daily for swelling  60 tablet  11  . lisinopril (PRINIVIL,ZESTRIL) 20 MG tablet Take 1 tablet (20 mg total) by mouth daily.  90 tablet  3  . Multiple Vitamin (MULTIVITAMIN) tablet Take 1 tablet by mouth daily.        . meloxicam (MOBIC) 7.5 MG tablet 1 by mouth once daily w/ food as needed for joint/back pain         No Known Allergies   Current Medications, Allergies, Past Medical History, Past Surgical History, Family History, and Social History were reviewed in [employer] record.    Review of Systems        Constitutional:   No  weight loss, night sweats,  fevers, chills, +  fatigue, or  lassitude.  HEENT:   No headaches,  difficulty swallowing,   tooth/dental problems, or  sore throat. No sneezing, itching, ear ache, nasal congestion, post nasal drip,   CV:  No chest pain,  orthopnea, PND, anasarca, dizziness, palpitations, syncope.  + swelling in lower extremities  GI: No heartburn, indigestion, abdominal pain, nausea, vomiting, diarrhea, change in bowel habits, loss of appetite, bloody stools.   Resp: No shortness of breath with exertion or at rest.  No excess mucus, no productive cough,  No non-productive cough,  No coughing up of blood.  No change in color of mucus.  No wheezing.  No chest wall deformity  Skin: no rash or lesions.  GU: no dysuria, change in color of urine, no urgency or frequency.  No flank pain or hematuria. No changes in usual urinary pattern.   MS:  No joint pain or swelling.  No decreased range of motion.  No back pain. + weakness   Psych:  No change in mood or affect. No depression. No memory loss. + anxiety, occasional    Objective:   Physical Exam     GENERAL:  Well-developed, Well-nourished, 78 y/o African American Female in NAD. Alert, pleasant & cooperative. Closes eyes intermittently while speaking.  HEENT: nasal pharynx clear, pink and moist. Oral pharynx pink and moist with no exudate or lesions.  NECK:  Supple w/ fairROM; no JVD; no thyromegaly or nodules palpated; no lymphadenopathy. CHEST:  Breath sounds present in all fields. No wheezes/ rales/ or rhonchi.  HEART:  Regular rate and rhythm. No murmurs/ rubs/ or gallops  ABDOMEN:  Soft & nontender; bowel sounds present throughouot; no organomegaly or masses detected. EXT:  mod arthritic changes; no varicose veins.  + venous insuffic. 2+ BLE edema. No leg pain. Negative homans.  NEURO:   Alert, answers questions appropriately, but slowly, no focal neuro deficits noted DERM:  No lesions noted; no rash etc... MSK: kyphosis and slight spinal curvature    Assessment & Plan:

## 2012-02-27 NOTE — Assessment & Plan Note (Deleted)
lower back weakness with no notable pain, trauma or injury degenerative joint changes with kyphosis and scoliosis  Calcium Carbonate-Vitamin D supplement   Plan:  Continue calcium and vitamin D daily supplements  May use Mobic daily as needed for joint/back pain

## 2012-02-27 NOTE — Patient Instructions (Signed)
Low salt diet .  Keep legs elevated.  I will call with labs  MAy use Lasix daily As needed  For swelling  May use Mobic daily As needed  For joint/back pain .  Please contact office for sooner follow up if symptoms do not improve or worsen or seek emergency care   follow up Dr. Kriste Basque  As planned and As needed

## 2012-03-25 ENCOUNTER — Encounter: Payer: Self-pay | Admitting: Pulmonary Disease

## 2012-03-25 ENCOUNTER — Ambulatory Visit (INDEPENDENT_AMBULATORY_CARE_PROVIDER_SITE_OTHER): Payer: Medicare Other | Admitting: Pulmonary Disease

## 2012-03-25 VITALS — BP 116/58 | HR 52 | Temp 97.8°F | Ht 60.0 in | Wt 121.2 lb

## 2012-03-25 DIAGNOSIS — G589 Mononeuropathy, unspecified: Secondary | ICD-10-CM

## 2012-03-25 DIAGNOSIS — R609 Edema, unspecified: Secondary | ICD-10-CM

## 2012-03-25 DIAGNOSIS — I10 Essential (primary) hypertension: Secondary | ICD-10-CM

## 2012-03-25 DIAGNOSIS — R5381 Other malaise: Secondary | ICD-10-CM

## 2012-03-25 DIAGNOSIS — M1990 Unspecified osteoarthritis, unspecified site: Secondary | ICD-10-CM

## 2012-03-25 DIAGNOSIS — K5730 Diverticulosis of large intestine without perforation or abscess without bleeding: Secondary | ICD-10-CM

## 2012-03-25 DIAGNOSIS — K219 Gastro-esophageal reflux disease without esophagitis: Secondary | ICD-10-CM

## 2012-03-25 DIAGNOSIS — M810 Age-related osteoporosis without current pathological fracture: Secondary | ICD-10-CM

## 2012-03-25 DIAGNOSIS — E7800 Pure hypercholesterolemia, unspecified: Secondary | ICD-10-CM | POA: Diagnosis not present

## 2012-03-25 DIAGNOSIS — F411 Generalized anxiety disorder: Secondary | ICD-10-CM

## 2012-03-25 DIAGNOSIS — I872 Venous insufficiency (chronic) (peripheral): Secondary | ICD-10-CM | POA: Diagnosis not present

## 2012-03-25 DIAGNOSIS — F0390 Unspecified dementia without behavioral disturbance: Secondary | ICD-10-CM

## 2012-03-25 NOTE — Progress Notes (Addendum)
Subjective:    Patient ID: Jessica [name], female    DOB: 01-14-1911, 78 y.o.   MRN: [id number]  HPI 78 y/o BF here for a follow up visit...  She has mult medical problems including:  AR & runny nose;  Asthmatic Bronchitis;  HBP;  Chronic VI & edema;  Hyperchol;  GERD & esoph stricture;  Divertics;  Hx Urinary incontinence & UTIs;  DJD/ Osteoporosis;  Semile Dementia;  Anxiety...  ~  December 26, 2010:  74mo ROV & clinically stable but she persists w/ mult somatic complaints:  hips> offered Ortho eval- "it's not that bad", has Mobic for Prn use;  runny nose: chr complaint, ?doesn't remember our numerous prev conversations- rec OTC Zyrtek;  "[name] needs his nerve pill"> Alpraz 0.25mg  written;  [name] wants labs done (ate strawberries & milk this AM)> routine labs all wnl, TChol is elev but HDL is 151!  ~  March 27, 2011:  74mo ROV & she's glad to be here because "things aren't doing to good"> c/o knees weak, knee pain (Mobic helps), noct leg discomfrt (Mobic helps this too), bladder trouble & she wants to see DrTannenbaum again as DrNesi hasn't been able to help her;  Wants to take Fish Oil- OK;  We reviewed her meds & her full labs from 1/12 w/ HDL= 151!!!  ~  May 30, 2011:  476mo ROV & she states "Oh, not doing so well"> c/o allergy & runny nose; feet & legs are burning & stinging (reviewed neuropathy w/ her & offered med but she declines meds at this time); wants more PT & balance training from the same [facility] therapist who comes to the house now for regular PT; wants power chair & I wrote Rx for [facility] to start the process; never went to see DrTannenbaum after last OV & she wants to resched this appt; finally she notes that Q64mo appts aren't satisfactory for her & she wants to be seen every 476months now...  ~  August 01, 2011:  476mo ROV & she reports that she's enjoyed the therapy, waiting for her power chair, & has appt w/ Urology next week...  She persists w/ mult somatic complaints> swelling in feet (edema is  actually down w/ Lasix 20mg - 2Qam & wt down 6# to 120# today); she notes that her nose runs "off & on for >67yr", legs achy & numb, back weak, wonders about allergy shots...  BP looks good; denies CP, palpit, syncope, ch in SOB, or cerebral ischemic symptoms...  She notes some constipation & we discussed Miralax & Senakot-S therapy...  She is really frustrated w/ [name]'s needs.  ~  October 03, 2011:  476mo ROV & here for face-to-face mobility eval per [facility] for her power wheelchair> forms completed & will be faxed to [facility] & scanned into the EMR... She states "Fair but not too good" today> notes that her legs are "nummy" & we discussed neuropathy & offered Gabapentin Rx but she doesn't want more pills;  C/o continued balance problems for which she had balance training which helped but she wants me to request MORE balance PT;  BP controlled, wt stable, persist LE edema L>R, etc...  She saw DrTannenbaum recently for Urology f/u w/ small hypersens bladder & large bladder diverticulum, not a surg candidate, taught "double voiding" tech; OK Flu vaccine, refills provided per request...  ~  December 18, 2011:  2-65mo ROV & she states "not doing too well" c/o weak (esp in AMs), "nummy" feeling in legs c/w neuropathy (  offer Gabapentin Rx & she will let me know), but notes that knees are better & the therapy is helping;  Wt is down 6# but she says appetite is good & she is eating well & resting well;  BP controlled on meds; On diet alone for Chol w/ VERY hi HDL; GI stable w/ Prilosec & Miralax/ Senakot-S; she saw DrTannenbaum for Urology 10/12- mult complaints, urodynamics done w/ sm hypersens bladder unstable w/ leakage, & large bladder divertic> he rec double voiding technique...  ~  March 25, 2012:  2mo ROV & Dalina is basically stable at 78 y/o; she notes some intermittent swelling in ankles & occas sharp CP laterally (min tender CWP)...  [name] is currently in rehab at [place] & I doubt she will be able to cqare for him  when the rehab stay is up...  Her CC is "just weak", eating OK, wt= 121#, walks w/ cane, no falls, denies SOB & manages ADLs ok...  We reviewed prob list, meds, xrays & labs>  See below>> LABS 3/13:  Chems- ok x Ca=11.1;  CBC- ok w/ Hg=13.2;  TSH=1.63;  BNP=101 We will need to check PTH in follow up...        Problem List:  Hx of ALLERGIES (ICD-995.3) - she uses OTC meds as needed, & we prev discussed Zyrtek 10mg Qam, Saline nasal mist, & ATROVENT Nasal 0.03% Tid for drainage (one of her chronic complaints)...  Hx of ASTHMATIC BRONCHITIS, ACUTE (ICD-466.0) - no recent exacerbations...  HYPERTENSION (ICD-401.9) - controlled on NORVASC 5mg , LISINOPRIL 20mg /d & FUROSEMIDE 20mg - 1-2 each AM... ~  4/12:  BP= 110/60 and she states that she is taking her meds regularly & tolerating them well... denies HA, visual changes, CP, palpit, change in dyspnea, etc... but notes weakness, intermittently dizzy & persist edema in her feet...  ~  6/12:  BP= 124/70 & she remains stable... ~  8/12:  BP= 110/ 66 & stable... ~  10/12:  BP= 114/62 & stable... ~  1/13:  BP= 110/64 & she remains stable... ~  4/13:  BP= 116/58 & she is reminded to adjust Lasix 1-2 Qam according to her edema...  VENOUS INSUFFICIENCY, CHRONIC (ICD-459.81) - there is mild pedal edema bilat L>R... on low sodium diet, elevation, support hose- prev refused diuretic meds due to urinary symptoms, now on LASIX 20mg - 1-2 Qam.  HYPERCHOLESTEROLEMIA (ICD-272.0) - prev on Lip10, but she stopped this on her own... she has a very high HDL!!! ~  FLP was 1/08 showed TChol 253, TG 125, HDL 121, LDL 93 ~  FLP 2/09 showed TChol 200, TG 99, HDL 136, LDL 44 ~  FLP 4/11 showed TChol 244, TG 143, HDL 149, LDL 59 ~  FLP 1/12 showed TChol 266, TG 102, HDL 151, LDL 83 >> BEST HDL I'VE EVER SEEN  HYPERCALCEMIA >> routine labs w/ Calcium levels betw 10.2 & 11.7 over the last 65yrs... ~  We will need to check PTH level on return...  GERD (ICD-530.81) - last EGD  was 9/05 by DrPerry showing GERD and esoph stricture- dilated... she stopped her Nexium but states that her swallowing is good & she uses PRILOSEC OTC as needed.  DIVERTICULOSIS OF COLON (ICD-562.10) - last colonoscopy was 11/00 showing divertics only... ~  8/12:  She notes some constipation & rec to take [facility] & SENAKOT-S per our usual protocol...  HX, URINARY INFECTION (ICD-V13.02) & INCONTINENCE symptoms... she has seen DrTannenbaum & staff on bladder exercises and she reported some improvement but has  persistant symptoms & never followed up... ~  7/10:  offered f/u appt w/ Urology vs second opinion consult but she declines... ~  9/10:  wrote for trial Enablex 7.5mg /d >> no benefit she says. ~  12/10: saw DrNesi- tried sample med, sl improved, but "he turned me over to a lady for longer term treatments" she says... ~  4/12 & 6/12: states DrNesi hasn't been able to help her & she wants appt w/ DrTannenbaum==> seen w/ urodynamic eval revealing a sm hypersens bladder & a large bladder divertic; not a surg candidate, taught double voiding technique... ~  10/12: she had f/u DrTannenbaum & he reinforced prev w/u & need for double voiding technique...  DEGENERATIVE JOINT DISEASE (ICD-715.90) - notes right shoulder symptoms, but managing OK w/ MOBIC 7.5mg  & Tylenol; also right hip & low back pain...  she also c/o neuropathic "burning" in legs but not bad enough for additional meds she says...  OSTEOPOROSIS (ICD-733.00) - she was on Fosamax but had discontinued this on her own... we discussed the need for continued therapy and she was asked to restart Alendronate but she never did...  she takes Ca++, MVI, VitD==> rec to STOP the Calcium.  SENILE DEMENTIA (ICD-290.0) - she takes ASA 81mg /d... she tried Aricept in the past but didn't notice any improvement therefore stopped it. ~  she & her husb still live on their own but are having numerous difficulties while still resisting any thoughts of assisted  living etc> got concerned because she saw an impulse in her left antecubital fossa (tortuous brachial art), went to the ER & this got translated to palpitations & lead to full ER cardiac eval= neg... ~  6/11: MMSE showed 26/30 & she agreed to try DONEPEZIL 10mg /d... ~  6/12:  Still taking Aricept & appears stable...  ANXIETY (ICD-300.00) - she uses Alpraz 0.25mg  as needed.  Hx of SHINGLES (ICD-053.9)   Past Surgical History  Procedure Date  . Vesicovaginal fistula closure w/ tah   . Tonsillectomy   . Abdominal hysterectomy     Outpatient Encounter Prescriptions as of 03/25/2012  Medication Sig Dispense Refill  . ALPRAZolam (XANAX) 0.25 MG tablet 1/2 -1 tablet by mouth three times a day as needed for nerves  90 tablet  5  . amLODipine (NORVASC) 5 MG tablet Take 1 tablet (5 mg total) by mouth daily.  90 tablet  3  . aspirin 81 MG tablet Take 81 mg by mouth daily.        . Calcium Carbonate-Vitamin D 600-400 MG-UNIT per tablet Take 1 tablet by mouth 2 (two) times daily.        . Cholecalciferol (VITAMIN D) 1000 UNITS capsule Take 1,000 Units by mouth daily.        Marland Kitchen donepezil (ARICEPT) 10 MG tablet Take 1 tablet (10 mg total) by mouth daily.  30 tablet  5  . furosemide (LASIX) 20 MG tablet 1-2 tabs daily for swelling  60 tablet  11  . lisinopril (PRINIVIL,ZESTRIL) 20 MG tablet Take 1 tablet (20 mg total) by mouth daily.  90 tablet  3  . Multiple Vitamin (MULTIVITAMIN) tablet Take 1 tablet by mouth daily.        . meloxicam (MOBIC) 7.5 MG tablet 1 by mouth once daily w/ food as needed for joint/back pain         No Known Allergies   Current Medications, Allergies, Past Medical History, Past Surgical History, Family History, and Social History were reviewed in [employer] [facility]  electronic medical record.    Review of Systems        See HPI - all other systems neg except as noted...      The patient complains of decreased hearing, dyspnea on exertion, peripheral edema, incontinence,  muscle weakness, and difficulty walking.  The patient denies anorexia, fever, weight loss, weight gain, vision loss, hoarseness, chest pain, syncope, prolonged cough, headaches, hemoptysis, abdominal pain, melena, hematochezia, severe indigestion/heartburn, hematuria, suspicious skin lesions, transient blindness, depression, unusual weight change, abnormal bleeding, enlarged lymph nodes, and angioedema.     Objective:   Physical Exam      WD, WN, 78 y/o BF in NAD... GENERAL:  Alert, pleasant & cooperative; she is slow moving as noted... HEENT:  [place]/AT, EOM-full, EACs- some wax, TMs-wnl, NOSE-clear, THROAT-clear & wnl. NECK:  Supple w/ fairROM; no JVD; normal carotid impulses w/o bruits; no thyromegaly or nodules palpated; no lymphadenopathy. CHEST:  Clear to P & A; without wheezes/ rales/ or rhonchi., noted kyphosis... HEART:  Regular Rhythm; without murmurs/ rubs/ or gallops heard... ABDOMEN:  Soft & nontender; normal bowel sounds; no organomegaly or masses detected. EXT:  mod arthritic changes; no varicose veins/ +venous insuffic/ 1-2+edema... NEURO:   Alert, answers questions appropriately, but slowly, no focal neuro deficits... DERM:  No lesions noted; no rash etc...  MMSE> 06/13/10> Score 26/30 (missed place, 2/3 recall after distraction, & copy figure)...  REVIEW OF WEIGHTS: Jun09 to Jun10 - weights varied betw 123-129# Jun10 weight= 123# w/ 1+ edema reported. Jul10 weight= 117# w/ 1+ edema present. Sep10 weight= 122# w/ mod pedal edema. Jan11 weight = 119# w/ min edema. Jun11 weight = 123# w/ persist edema. ZOX09 weight = 118# w/ min swelling Apr12 weight = 114#... rec to add nutritional supplement daily. UEA54 weight = 126#... She has mild pedal edema & advised to inc Lasix20 to 2Qam. Aug12 weight = 120#... Decreased edema. UJW11 weight = 116# Apr13 weight = 121#   Assessment & Plan:   AR & ASTHMA>  Breathing is stable, at baseline, she has chr rhinorrhea complaints w/  numerous discussions regarding management of this chronic problem....  HBP>  Controlled on meds, continue same...  Ven insuffic>  Reminded of sodium restriction etc; asked to keep her Lasix20 at 2 Qam for swelling...  CHOL>  She has the world's best HDL!!!  Hypercalcemia>  She is instructed to stop her calcium supplement & we will recheck w/ PTH level on return...  GU>  She saw DrTannenbaum w/ extensive investigation into her voiding symptoms & urodynamics as above; rec to use "double voiding" technique...  DJD/ Osteopenia>  Aware, she is stable on OTC meds, she has repeatedly refuse Bisphos meds for her bones...  Dementia>  She & husb [name] are still living independently & have been married 62 yrs.  Other medical problems as noted...   Patient's Medications  New Prescriptions   No medications on file  Previous Medications   ALPRAZOLAM (XANAX) 0.25 MG TABLET    1/2 -1 tablet by mouth three times a day as needed for nerves   AMLODIPINE (NORVASC) 5 MG TABLET    Take 1 tablet (5 mg total) by mouth daily.   ASPIRIN 81 MG TABLET    Take 81 mg by mouth daily.     CHOLECALCIFEROL (VITAMIN D) 1000 UNITS CAPSULE    Take 1,000 Units by mouth daily.     DONEPEZIL (ARICEPT) 10 MG TABLET    Take 1 tablet (10 mg total) by mouth daily.  FUROSEMIDE (LASIX) 20 MG TABLET    1-2 tabs daily for swelling   LISINOPRIL (PRINIVIL,ZESTRIL) 20 MG TABLET    Take 1 tablet (20 mg total) by mouth daily.   MELOXICAM (MOBIC) 7.5 MG TABLET    1 by mouth once daily w/ food as needed for joint/back pain    MULTIPLE VITAMIN (MULTIVITAMIN) TABLET    Take 1 tablet by mouth daily.    Modified Medications   No medications on file  Discontinued Medications   STOP THE CALCIUM SUPPLEMENT.Marland KitchenMarland Kitchen

## 2012-03-25 NOTE — Patient Instructions (Signed)
Today we updated your med list in our EPIC system...    Continue your current medications the same...  We reviewed what you are to do about your swelling:    NO SALT or sodium in diet...    Elevate legs as much as possible...    Wear support hose when up & about...    Take the LASIX fluid pill> 1-2 tabs each AM...  Call for any problems...  Let's plan a follow up visit in 3 months.Marland KitchenMarland Kitchen

## 2012-04-24 ENCOUNTER — Other Ambulatory Visit: Payer: Self-pay | Admitting: Pulmonary Disease

## 2012-05-06 ENCOUNTER — Telehealth: Payer: Self-pay | Admitting: Pulmonary Disease

## 2012-05-06 NOTE — Telephone Encounter (Signed)
Per SN---we can see her on Friday at 10.  thanks

## 2012-05-06 NOTE — Telephone Encounter (Signed)
I spoke with pt and she states both her feet and ankles are swollen x couple months but is getting worse. Pt is keeping legs elevated, has not been eating any salt, and is taking lasix 20 mg 1 in the AM and 1 in the PM. She states she can barely walk and has no energy. Pt is requesting to be seen but no available openings. Please advise SN thanks  No Known Allergies

## 2012-05-06 NOTE — Telephone Encounter (Signed)
I spoke with pt and she is scheduled to come in and see SN on Friday at 10. I advised pt to continue what she is doing. She voiced her understanding and had no questions

## 2012-05-08 ENCOUNTER — Ambulatory Visit (INDEPENDENT_AMBULATORY_CARE_PROVIDER_SITE_OTHER): Payer: Medicare Other | Admitting: Pulmonary Disease

## 2012-05-08 ENCOUNTER — Ambulatory Visit (INDEPENDENT_AMBULATORY_CARE_PROVIDER_SITE_OTHER)
Admission: RE | Admit: 2012-05-08 | Discharge: 2012-05-08 | Disposition: A | Discharge status: Home / Self Care | Payer: Medicare Other | Source: Ambulatory Visit | Attending: Pulmonary Disease | Admitting: Pulmonary Disease

## 2012-05-08 ENCOUNTER — Encounter: Payer: Self-pay | Admitting: Pulmonary Disease

## 2012-05-08 VITALS — BP 140/72 | HR 69 | Temp 97.3°F | Ht 60.0 in | Wt 118.2 lb

## 2012-05-08 DIAGNOSIS — K219 Gastro-esophageal reflux disease without esophagitis: Secondary | ICD-10-CM

## 2012-05-08 DIAGNOSIS — F0390 Unspecified dementia without behavioral disturbance: Secondary | ICD-10-CM

## 2012-05-08 DIAGNOSIS — R609 Edema, unspecified: Secondary | ICD-10-CM | POA: Diagnosis not present

## 2012-05-08 DIAGNOSIS — F411 Generalized anxiety disorder: Secondary | ICD-10-CM

## 2012-05-08 DIAGNOSIS — I10 Essential (primary) hypertension: Secondary | ICD-10-CM

## 2012-05-08 DIAGNOSIS — M810 Age-related osteoporosis without current pathological fracture: Secondary | ICD-10-CM

## 2012-05-08 DIAGNOSIS — I872 Venous insufficiency (chronic) (peripheral): Secondary | ICD-10-CM | POA: Diagnosis not present

## 2012-05-08 DIAGNOSIS — K5730 Diverticulosis of large intestine without perforation or abscess without bleeding: Secondary | ICD-10-CM

## 2012-05-08 DIAGNOSIS — E7800 Pure hypercholesterolemia, unspecified: Secondary | ICD-10-CM | POA: Diagnosis not present

## 2012-05-08 DIAGNOSIS — M4120 Other idiopathic scoliosis, site unspecified: Secondary | ICD-10-CM | POA: Diagnosis not present

## 2012-05-08 DIAGNOSIS — M1990 Unspecified osteoarthritis, unspecified site: Secondary | ICD-10-CM

## 2012-05-08 DIAGNOSIS — J811 Chronic pulmonary edema: Secondary | ICD-10-CM | POA: Diagnosis not present

## 2012-05-08 MED ORDER — TORSEMIDE 20 MG PO TABS: ORAL_TABLET | ORAL | Status: DC

## 2012-05-08 NOTE — Patient Instructions (Signed)
Today we updated your med list in our EPIC system...     We decided to change your fluid pill to the new DEMADEX 20mg - take 2 tabs each AM...    Continue to eliminate the salt from your diet...    Keep your legs elevated...    Wear support hose...  Today we rechecked your CXR...  Let's plan a follow up visit in 1 month w/ blood work at that time.Jessica KitchenMarland [name]

## 2012-05-08 NOTE — Progress Notes (Signed)
Subjective:    Patient ID: Jessica [name], female    DOB: 01-14-1911, 78 y.o.   MRN: [id number]  HPI 78 y/o BF here for a follow up visit...  She has mult medical problems including:  AR & runny nose;  Asthmatic Bronchitis;  HBP;  Chronic VI & edema;  Hyperchol;  GERD & esoph stricture;  Divertics;  Hx Urinary incontinence & UTIs;  DJD/ Osteoporosis;  Semile Dementia;  Anxiety...  ~  December 26, 2010:  74mo ROV & clinically stable but she persists w/ mult somatic complaints:  hips> offered Ortho eval- "it's not that bad", has Mobic for Prn use;  runny nose: chr complaint, ?doesn't remember our numerous prev conversations- rec OTC Zyrtek;  "[name] needs his nerve pill"> Alpraz 0.25mg  written;  [name] wants labs done (ate strawberries & milk this AM)> routine labs all wnl, TChol is elev but HDL is 151!  ~  March 27, 2011:  74mo ROV & she's glad to be here because "things aren't doing to good"> c/o knees weak, knee pain (Mobic helps), noct leg discomfrt (Mobic helps this too), bladder trouble & she wants to see DrTannenbaum again as DrNesi hasn't been able to help her;  Wants to take Fish Oil- OK;  We reviewed her meds & her full labs from 1/12 w/ HDL= 151!!!  ~  May 30, 2011:  476mo ROV & she states "Oh, not doing so well"> c/o allergy & runny nose; feet & legs are burning & stinging (reviewed neuropathy w/ her & offered med but she declines meds at this time); wants more PT & balance training from the same [facility] therapist who comes to the house now for regular PT; wants power chair & I wrote Rx for [facility] to start the process; never went to see DrTannenbaum after last OV & she wants to resched this appt; finally she notes that Q64mo appts aren't satisfactory for her & she wants to be seen every 476months now...  ~  August 01, 2011:  476mo ROV & she reports that she's enjoyed the therapy, waiting for her power chair, & has appt w/ Urology next week...  She persists w/ mult somatic complaints> swelling in feet (edema is  actually down w/ Lasix 20mg - 2Qam & wt down 6# to 120# today); she notes that her nose runs "off & on for >67yr", legs achy & numb, back weak, wonders about allergy shots...  BP looks good; denies CP, palpit, syncope, ch in SOB, or cerebral ischemic symptoms...  She notes some constipation & we discussed Miralax & Senakot-S therapy...  She is really frustrated w/ [name]'s needs.  ~  October 03, 2011:  476mo ROV & here for face-to-face mobility eval per [facility] for her power wheelchair> forms completed & will be faxed to [facility] & scanned into the EMR... She states "Fair but not too good" today> notes that her legs are "nummy" & we discussed neuropathy & offered Gabapentin Rx but she doesn't want more pills;  C/o continued balance problems for which she had balance training which helped but she wants me to request MORE balance PT;  BP controlled, wt stable, persist LE edema L>R, etc...  She saw DrTannenbaum recently for Urology f/u w/ small hypersens bladder & large bladder diverticulum, not a surg candidate, taught "double voiding" tech; OK Flu vaccine, refills provided per request...  ~  December 18, 2011:  2-65mo ROV & she states "not doing too well" c/o weak (esp in AMs), "nummy" feeling in legs c/w neuropathy (  offer Gabapentin Rx & she will let me know), but notes that knees are better & the therapy is helping;  Wt is down 6# but she says appetite is good & she is eating well & resting well;  BP controlled on meds; On diet alone for Chol w/ VERY hi HDL; GI stable w/ Prilosec & Miralax/ Senakot-S; she saw DrTannenbaum for Urology 10/12- mult complaints, urodynamics done w/ sm hypersens bladder unstable w/ leakage, & large bladder divertic> he rec double voiding technique...  ~  March 25, 2012:  90mo ROV & Ovella is basically stable at 78 y/o; she notes some intermittent swelling in ankles & occas sharp CP laterally (min tender CWP)...  [name] is currently in rehab at [place] & I doubt she will be able to cqare for him  when the rehab stay is up...  Her CC is "just weak", eating OK, wt= 121#, walks w/ cane, no falls, denies SOB & manages ADLs ok...  We reviewed prob list, meds, xrays & labs>  See below>> LABS 3/13:  Chems- ok x Ca=11.1;  CBC- ok w/ Hg=13.2;  TSH=1.63;  BNP=101 We will need to check PTH in follow up...  ~  May 08, 2012:  6wk ROV & Kimetha returns w/ increased swelling in ankles and feet- she has known VI & periph edema that has been refractory to management w/ her attempts at low sodium diet, elevation & support hose; she also takes Lasix 20mg  starting at one Qam & increased to Bid; she notes that swelling tends to go down overnight & is worse late in the day; she has lost 3# to 118# since last OV yet she is very concerned 7 quite perturbed about the edema...  We discussed the need to check CXR & labs; we will change the Lasix to Demadex 20mg - 2 Qam & follow up 1 month... CXR 5/13 showed borderline heart size, kyphoscoliosis, no edema, and clear lungs... LABS 5/13:  pending        Problem List:  Hx of ALLERGIES (ICD-995.3) - she uses OTC meds as needed, & we prev discussed Zyrtek 10mg Qam, Saline nasal mist, & ATROVENT Nasal 0.03% Tid for drainage (one of her chronic complaints)...  Hx of ASTHMATIC BRONCHITIS, ACUTE (ICD-466.0) - no recent exacerbations... ~  CXR 5/13 showed borderline heart size, kyphoscoliosis, no edema, and clear lungs...  HYPERTENSION (ICD-401.9) - controlled on NORVASC 5mg , LISINOPRIL 20mg /d & FUROSEMIDE 20mg - 1-2 each AM... ~  4/12:  BP= 110/60 and she states that she is taking her meds regularly & tolerating them well... denies HA, visual changes, CP, palpit, change in dyspnea, etc... but notes weakness, intermittently dizzy & persist edema in her feet...  ~  6/12:  BP= 124/70 & she remains stable... ~  8/12:  BP= 110/ 66 & stable... ~  10/12:  BP= 114/62 & stable... ~  1/13:  BP= 110/64 & she remains stable... ~  4/13:  BP= 116/58 & she is reminded to adjust Lasix 1-2 Qam  according to her edema... ~  5/13:  BP= 140/72 & she is c/o pedal edema; we decided to change to Demadex 20mg - 2Qam.  VENOUS INSUFFICIENCY, CHRONIC (ICD-459.81) - there is mild pedal edema bilat L>R... on low sodium diet, elevation, support hose- prev refused diuretic meds due to urinary symptoms, now on DEMADEX 20mg - 2Qam.  HYPERCHOLESTEROLEMIA (ICD-272.0) - prev on Lip10, but she stopped this on her own... she has a very high HDL!!! ~  FLP was 1/08 showed TChol 253, TG 125,  HDL 121, LDL 93 ~  FLP 2/09 showed TChol 200, TG 99, HDL 136, LDL 44 ~  FLP 4/11 showed TChol 244, TG 143, HDL 149, LDL 59 ~  FLP 1/12 showed TChol 266, TG 102, HDL 151, LDL 83 >> BEST HDL I'VE EVER SEEN  HYPERCALCEMIA >> routine labs w/ Calcium levels betw 10.2 & 11.7 over the last 20yrs... ~  We will need to check PTH level on return...  GERD (ICD-530.81) - last EGD was 9/05 by DrPerry showing GERD and esoph stricture- dilated... she stopped her Nexium but states that her swallowing is good & she uses PRILOSEC OTC as needed.  DIVERTICULOSIS OF COLON (ICD-562.10) - last colonoscopy was 11/00 showing divertics only... ~  8/12:  She notes some constipation & rec to take [facility] & SENAKOT-S per our usual protocol...  HX, URINARY INFECTION (ICD-V13.02) & INCONTINENCE symptoms... she has seen DrTannenbaum & staff on bladder exercises and she reported some improvement but has persistant symptoms & never followed up... ~  7/10:  offered f/u appt w/ Urology vs second opinion consult but she declines... ~  9/10:  wrote for trial Enablex 7.5mg /d >> no benefit she says. ~  12/10: saw DrNesi- tried sample med, sl improved, but "he turned me over to a lady for longer term treatments" she says... ~  4/12 & 6/12: states DrNesi hasn't been able to help her & she wants appt w/ DrTannenbaum==> seen w/ urodynamic eval revealing a sm hypersens bladder & a large bladder divertic; not a surg candidate, taught double voiding technique... ~   10/12: she had f/u DrTannenbaum & he reinforced prev w/u & need for double voiding technique...  DEGENERATIVE JOINT DISEASE (ICD-715.90) - notes right shoulder symptoms, but managing OK w/ Mobic 7.5mg  & Tylenol; also right hip & low back pain...  she also c/o neuropathic "burning" in legs but not bad enough for additional meds she says...  OSTEOPOROSIS (ICD-733.00) - she was on Fosamax but had discontinued this on her own... we discussed the need for continued therapy and she was asked to restart Alendronate but she never did...  she takes Ca++, MVI, VitD==> rec to STOP the Calcium.  SENILE DEMENTIA (ICD-290.0) - she takes ASA 81mg /d... she tried Aricept in the past but didn't notice any improvement therefore stopped it. ~  she & her husb still live on their own but are having numerous difficulties while still resisting any thoughts of assisted living etc> got concerned because she saw an impulse in her left antecubital fossa (tortuous brachial art), went to the ER & this got translated to palpitations & lead to full ER cardiac eval= neg... ~  6/11: MMSE showed 26/30 & she agreed to try DONEPEZIL 10mg /d... ~  6/12:  Still taking Aricept & appears stable...  ANXIETY (ICD-300.00) - she uses Alpraz 0.25mg  as needed.  Hx of SHINGLES (ICD-053.9)   Past Surgical History  Procedure Date  . Vesicovaginal fistula closure w/ tah   . Tonsillectomy   . Abdominal hysterectomy     Outpatient Encounter Prescriptions as of 05/08/2012  Medication Sig Dispense Refill  . ALPRAZolam (XANAX) 0.25 MG tablet 1/2 -1 tablet by mouth three times a day as needed for nerves  90 tablet  5  . amLODipine (NORVASC) 5 MG tablet Take 1 tablet (5 mg total) by mouth daily.  90 tablet  3  . aspirin 81 MG tablet Take 81 mg by mouth daily.        . Calcium Carbonate-Vitamin D 600-400  MG-UNIT per tablet Take 1 tablet by mouth 2 (two) times daily.        . Cholecalciferol (VITAMIN D) 1000 UNITS capsule Take 1,000 Units by mouth  daily.        Marland Kitchen donepezil (ARICEPT) 10 MG tablet TAKE 1 TABLET (10 MG TOTAL) BY MOUTH DAILY.  30 tablet  5  . furosemide (LASIX) 20 MG tablet 1-2 tabs daily for swelling  60 tablet  11  . lisinopril (PRINIVIL,ZESTRIL) 20 MG tablet Take 1 tablet (20 mg total) by mouth daily.  90 tablet  3  . Multiple Vitamin (MULTIVITAMIN) tablet Take 1 tablet by mouth daily.        . meloxicam (MOBIC) 7.5 MG tablet 1 by mouth once daily w/ food as needed for joint/back pain         No Known Allergies   Current Medications, Allergies, Past Medical History, Past Surgical History, Family History, and Social History were reviewed in [employer] record.    Review of Systems        See HPI - all other systems neg except as noted...      The patient complains of decreased hearing, dyspnea on exertion, peripheral edema, incontinence, muscle weakness, and difficulty walking.  The patient denies anorexia, fever, weight loss, weight gain, vision loss, hoarseness, chest pain, syncope, prolonged cough, headaches, hemoptysis, abdominal pain, melena, hematochezia, severe indigestion/heartburn, hematuria, suspicious skin lesions, transient blindness, depression, unusual weight change, abnormal bleeding, enlarged lymph nodes, and angioedema.     Objective:   Physical Exam      WD, WN, 78 y/o BF in NAD... GENERAL:  Alert, pleasant & cooperative; she is slow moving as noted... HEENT:  [place]/AT, EOM-full, EACs- some wax, TMs-wnl, NOSE-clear, THROAT-clear & wnl. NECK:  Supple w/ fairROM; no JVD; normal carotid impulses w/o bruits; no thyromegaly or nodules palpated; no lymphadenopathy. CHEST:  Clear to P & A; without wheezes/ rales/ or rhonchi., noted kyphosis... HEART:  Regular Rhythm; without murmurs/ rubs/ or gallops heard... ABDOMEN:  Soft & nontender; normal bowel sounds; no organomegaly or masses detected. EXT:  mod arthritic changes; no varicose veins/ +venous insuffic/ 1-2+edema... NEURO:    Alert, answers questions appropriately, but slowly, no focal neuro deficits... DERM:  No lesions noted; no rash etc...  MMSE> 06/13/10> Score 26/30 (missed place, 2/3 recall after distraction, & copy figure)...   Assessment & Plan:   AR & ASTHMA>  Breathing is stable, at baseline, she has chr rhinorrhea complaints w/ numerous discussions regarding management of this chronic problem....  HBP>  Controlled on meds, continue same...  Ven insuffic>  Her CC is pedal edema, she does not grasp the idea of ven insuffic but says she's not eating salt; rec elevation, support hose & change to Demadex20mg -2Qam...  CHOL>  She has the world's best HDL!!!  Hypercalcemia>  She is instructed to stop her calcium supplement & we will recheck w/ PTH level on return...  GU>  She saw DrTannenbaum w/ extensive investigation into her voiding symptoms & urodynamics as above; rec to use "double voiding" technique...  DJD/ Osteopenia>  Aware, she is stable on OTC meds, she has repeatedly refuse Bisphos meds for her bones...  Dementia>  She & husb [name] are still living independently & have been married 62 yrs.  Other medical problems as noted...   Patient's Medications  New Prescriptions   TORSEMIDE (DEMADEX) 20 MG TABLET    Take 2 tablets by mouth every morning  Previous Medications  ALPRAZOLAM (XANAX) 0.25 MG TABLET    1/2 -1 tablet by mouth three times a day as needed for nerves   AMLODIPINE (NORVASC) 5 MG TABLET    Take 1 tablet (5 mg total) by mouth daily.   ASPIRIN 81 MG TABLET    Take 81 mg by mouth daily.     CALCIUM CARBONATE-VITAMIN D 600-400 MG-UNIT PER TABLET    Take 1 tablet by mouth 2 (two) times daily.     CHOLECALCIFEROL (VITAMIN D) 1000 UNITS CAPSULE    Take 1,000 Units by mouth daily.     DONEPEZIL (ARICEPT) 10 MG TABLET    TAKE 1 TABLET (10 MG TOTAL) BY MOUTH DAILY.   LISINOPRIL (PRINIVIL,ZESTRIL) 20 MG TABLET    Take 1 tablet (20 mg total) by mouth daily.   MELOXICAM (MOBIC) 7.5 MG  TABLET    1 by mouth once daily w/ food as needed for joint/back pain    MULTIPLE VITAMIN (MULTIVITAMIN) TABLET    Take 1 tablet by mouth daily.    Modified Medications   No medications on file  Discontinued Medications   FUROSEMIDE (LASIX) 20 MG TABLET    1-2 tabs daily for swelling

## 2012-05-12 ENCOUNTER — Other Ambulatory Visit: Payer: Self-pay | Admitting: Pulmonary Disease

## 2012-05-12 DIAGNOSIS — R5383 Other fatigue: Secondary | ICD-10-CM

## 2012-05-12 DIAGNOSIS — I10 Essential (primary) hypertension: Secondary | ICD-10-CM

## 2012-05-12 DIAGNOSIS — R634 Abnormal weight loss: Secondary | ICD-10-CM

## 2012-05-12 DIAGNOSIS — E7800 Pure hypercholesterolemia, unspecified: Secondary | ICD-10-CM

## 2012-05-20 ENCOUNTER — Telehealth: Payer: Self-pay | Admitting: Pulmonary Disease

## 2012-05-20 DIAGNOSIS — R5381 Other malaise: Secondary | ICD-10-CM

## 2012-05-20 NOTE — Telephone Encounter (Signed)
Spoke with pt. She states having trouble with walking again and wants order sent to [facility] for PT- specifically asked to have [facility] as her PT. Please advise if okay to send order, thanks!

## 2012-05-20 NOTE — Telephone Encounter (Signed)
Spoke with pt and notified that the order was sent. She verbalized understanding and states nothing further needed.

## 2012-05-20 NOTE — Telephone Encounter (Signed)
Order has been placed for PT for the pt with the requested therapist.  They will contact the pt with the date and time.  thanks

## 2012-06-01 DIAGNOSIS — I10 Essential (primary) hypertension: Secondary | ICD-10-CM | POA: Diagnosis not present

## 2012-06-01 DIAGNOSIS — R269 Unspecified abnormalities of gait and mobility: Secondary | ICD-10-CM | POA: Diagnosis not present

## 2012-06-01 DIAGNOSIS — F411 Generalized anxiety disorder: Secondary | ICD-10-CM | POA: Diagnosis not present

## 2012-06-01 DIAGNOSIS — F0390 Unspecified dementia without behavioral disturbance: Secondary | ICD-10-CM | POA: Diagnosis not present

## 2012-06-01 DIAGNOSIS — IMO0001 Reserved for inherently not codable concepts without codable children: Secondary | ICD-10-CM | POA: Diagnosis not present

## 2012-06-01 DIAGNOSIS — M6281 Muscle weakness (generalized): Secondary | ICD-10-CM | POA: Diagnosis not present

## 2012-06-05 ENCOUNTER — Other Ambulatory Visit: Payer: Self-pay | Admitting: Pulmonary Disease

## 2012-06-05 ENCOUNTER — Telehealth: Payer: Self-pay | Admitting: Pulmonary Disease

## 2012-06-05 DIAGNOSIS — F411 Generalized anxiety disorder: Secondary | ICD-10-CM | POA: Diagnosis not present

## 2012-06-05 DIAGNOSIS — I10 Essential (primary) hypertension: Secondary | ICD-10-CM | POA: Diagnosis not present

## 2012-06-05 DIAGNOSIS — IMO0001 Reserved for inherently not codable concepts without codable children: Secondary | ICD-10-CM | POA: Diagnosis not present

## 2012-06-05 DIAGNOSIS — M6281 Muscle weakness (generalized): Secondary | ICD-10-CM | POA: Diagnosis not present

## 2012-06-05 DIAGNOSIS — F0390 Unspecified dementia without behavioral disturbance: Secondary | ICD-10-CM | POA: Diagnosis not present

## 2012-06-05 DIAGNOSIS — R269 Unspecified abnormalities of gait and mobility: Secondary | ICD-10-CM | POA: Diagnosis not present

## 2012-06-05 MED ORDER — TORSEMIDE 20 MG PO TABS: ORAL_TABLET | ORAL | Status: DC

## 2012-06-05 MED ORDER — DONEPEZIL HCL 10 MG PO TABS: 10.0000 mg | ORAL_TABLET | Freq: Every evening | ORAL | Status: DC | PRN

## 2012-06-05 NOTE — Telephone Encounter (Signed)
Pharmacy requesting 90 day supply on torsemide 20mg  .I sent the Rx to the pharmacy.

## 2012-06-05 NOTE — Telephone Encounter (Signed)
Called and spoke with  Sam at [facility] and she is aware that it is ok to refill the aricept for a 90 day supply.  Nothing further needed.

## 2012-06-08 DIAGNOSIS — R269 Unspecified abnormalities of gait and mobility: Secondary | ICD-10-CM | POA: Diagnosis not present

## 2012-06-08 DIAGNOSIS — IMO0001 Reserved for inherently not codable concepts without codable children: Secondary | ICD-10-CM | POA: Diagnosis not present

## 2012-06-08 DIAGNOSIS — F411 Generalized anxiety disorder: Secondary | ICD-10-CM | POA: Diagnosis not present

## 2012-06-08 DIAGNOSIS — I10 Essential (primary) hypertension: Secondary | ICD-10-CM | POA: Diagnosis not present

## 2012-06-08 DIAGNOSIS — M6281 Muscle weakness (generalized): Secondary | ICD-10-CM | POA: Diagnosis not present

## 2012-06-08 DIAGNOSIS — F0390 Unspecified dementia without behavioral disturbance: Secondary | ICD-10-CM | POA: Diagnosis not present

## 2012-06-09 ENCOUNTER — Ambulatory Visit: Payer: Medicare Other | Admitting: Pulmonary Disease

## 2012-06-10 DIAGNOSIS — IMO0001 Reserved for inherently not codable concepts without codable children: Secondary | ICD-10-CM | POA: Diagnosis not present

## 2012-06-10 DIAGNOSIS — F411 Generalized anxiety disorder: Secondary | ICD-10-CM | POA: Diagnosis not present

## 2012-06-10 DIAGNOSIS — R269 Unspecified abnormalities of gait and mobility: Secondary | ICD-10-CM | POA: Diagnosis not present

## 2012-06-10 DIAGNOSIS — M6281 Muscle weakness (generalized): Secondary | ICD-10-CM | POA: Diagnosis not present

## 2012-06-10 DIAGNOSIS — F0390 Unspecified dementia without behavioral disturbance: Secondary | ICD-10-CM | POA: Diagnosis not present

## 2012-06-10 DIAGNOSIS — I10 Essential (primary) hypertension: Secondary | ICD-10-CM | POA: Diagnosis not present

## 2012-06-11 ENCOUNTER — Ambulatory Visit: Payer: Medicare Other | Admitting: Pulmonary Disease

## 2012-06-12 DIAGNOSIS — IMO0001 Reserved for inherently not codable concepts without codable children: Secondary | ICD-10-CM | POA: Diagnosis not present

## 2012-06-12 DIAGNOSIS — F0390 Unspecified dementia without behavioral disturbance: Secondary | ICD-10-CM | POA: Diagnosis not present

## 2012-06-12 DIAGNOSIS — R269 Unspecified abnormalities of gait and mobility: Secondary | ICD-10-CM | POA: Diagnosis not present

## 2012-06-12 DIAGNOSIS — I10 Essential (primary) hypertension: Secondary | ICD-10-CM | POA: Diagnosis not present

## 2012-06-12 DIAGNOSIS — F411 Generalized anxiety disorder: Secondary | ICD-10-CM | POA: Diagnosis not present

## 2012-06-12 DIAGNOSIS — M6281 Muscle weakness (generalized): Secondary | ICD-10-CM | POA: Diagnosis not present

## 2012-06-15 DIAGNOSIS — F0390 Unspecified dementia without behavioral disturbance: Secondary | ICD-10-CM | POA: Diagnosis not present

## 2012-06-15 DIAGNOSIS — R269 Unspecified abnormalities of gait and mobility: Secondary | ICD-10-CM | POA: Diagnosis not present

## 2012-06-15 DIAGNOSIS — F411 Generalized anxiety disorder: Secondary | ICD-10-CM | POA: Diagnosis not present

## 2012-06-15 DIAGNOSIS — M6281 Muscle weakness (generalized): Secondary | ICD-10-CM | POA: Diagnosis not present

## 2012-06-15 DIAGNOSIS — I10 Essential (primary) hypertension: Secondary | ICD-10-CM | POA: Diagnosis not present

## 2012-06-15 DIAGNOSIS — IMO0001 Reserved for inherently not codable concepts without codable children: Secondary | ICD-10-CM | POA: Diagnosis not present

## 2012-06-17 DIAGNOSIS — F0390 Unspecified dementia without behavioral disturbance: Secondary | ICD-10-CM | POA: Diagnosis not present

## 2012-06-17 DIAGNOSIS — IMO0001 Reserved for inherently not codable concepts without codable children: Secondary | ICD-10-CM | POA: Diagnosis not present

## 2012-06-17 DIAGNOSIS — M6281 Muscle weakness (generalized): Secondary | ICD-10-CM | POA: Diagnosis not present

## 2012-06-17 DIAGNOSIS — F411 Generalized anxiety disorder: Secondary | ICD-10-CM | POA: Diagnosis not present

## 2012-06-17 DIAGNOSIS — R269 Unspecified abnormalities of gait and mobility: Secondary | ICD-10-CM | POA: Diagnosis not present

## 2012-06-17 DIAGNOSIS — I10 Essential (primary) hypertension: Secondary | ICD-10-CM | POA: Diagnosis not present

## 2012-06-19 DIAGNOSIS — F411 Generalized anxiety disorder: Secondary | ICD-10-CM | POA: Diagnosis not present

## 2012-06-19 DIAGNOSIS — M6281 Muscle weakness (generalized): Secondary | ICD-10-CM | POA: Diagnosis not present

## 2012-06-19 DIAGNOSIS — I10 Essential (primary) hypertension: Secondary | ICD-10-CM | POA: Diagnosis not present

## 2012-06-19 DIAGNOSIS — F0390 Unspecified dementia without behavioral disturbance: Secondary | ICD-10-CM | POA: Diagnosis not present

## 2012-06-19 DIAGNOSIS — R269 Unspecified abnormalities of gait and mobility: Secondary | ICD-10-CM | POA: Diagnosis not present

## 2012-06-19 DIAGNOSIS — IMO0001 Reserved for inherently not codable concepts without codable children: Secondary | ICD-10-CM | POA: Diagnosis not present

## 2012-06-22 DIAGNOSIS — R269 Unspecified abnormalities of gait and mobility: Secondary | ICD-10-CM | POA: Diagnosis not present

## 2012-06-22 DIAGNOSIS — IMO0001 Reserved for inherently not codable concepts without codable children: Secondary | ICD-10-CM | POA: Diagnosis not present

## 2012-06-22 DIAGNOSIS — F0390 Unspecified dementia without behavioral disturbance: Secondary | ICD-10-CM | POA: Diagnosis not present

## 2012-06-22 DIAGNOSIS — F411 Generalized anxiety disorder: Secondary | ICD-10-CM | POA: Diagnosis not present

## 2012-06-22 DIAGNOSIS — I10 Essential (primary) hypertension: Secondary | ICD-10-CM | POA: Diagnosis not present

## 2012-06-22 DIAGNOSIS — M6281 Muscle weakness (generalized): Secondary | ICD-10-CM | POA: Diagnosis not present

## 2012-06-23 ENCOUNTER — Encounter: Payer: Self-pay | Admitting: Pulmonary Disease

## 2012-06-23 ENCOUNTER — Ambulatory Visit (INDEPENDENT_AMBULATORY_CARE_PROVIDER_SITE_OTHER): Payer: Medicare Other | Admitting: Pulmonary Disease

## 2012-06-23 ENCOUNTER — Other Ambulatory Visit (INDEPENDENT_AMBULATORY_CARE_PROVIDER_SITE_OTHER): Payer: Medicare Other

## 2012-06-23 VITALS — BP 108/60 | HR 54 | Temp 97.5°F | Ht 60.0 in | Wt 115.8 lb

## 2012-06-23 DIAGNOSIS — K219 Gastro-esophageal reflux disease without esophagitis: Secondary | ICD-10-CM

## 2012-06-23 DIAGNOSIS — I10 Essential (primary) hypertension: Secondary | ICD-10-CM

## 2012-06-23 DIAGNOSIS — E7800 Pure hypercholesterolemia, unspecified: Secondary | ICD-10-CM

## 2012-06-23 DIAGNOSIS — R609 Edema, unspecified: Secondary | ICD-10-CM

## 2012-06-23 DIAGNOSIS — I872 Venous insufficiency (chronic) (peripheral): Secondary | ICD-10-CM

## 2012-06-23 DIAGNOSIS — K5730 Diverticulosis of large intestine without perforation or abscess without bleeding: Secondary | ICD-10-CM

## 2012-06-23 DIAGNOSIS — F411 Generalized anxiety disorder: Secondary | ICD-10-CM

## 2012-06-23 DIAGNOSIS — R2681 Unsteadiness on feet: Secondary | ICD-10-CM

## 2012-06-23 DIAGNOSIS — M1990 Unspecified osteoarthritis, unspecified site: Secondary | ICD-10-CM

## 2012-06-23 DIAGNOSIS — F0390 Unspecified dementia without behavioral disturbance: Secondary | ICD-10-CM

## 2012-06-23 DIAGNOSIS — M810 Age-related osteoporosis without current pathological fracture: Secondary | ICD-10-CM

## 2012-06-23 LAB — BASIC METABOLIC PANEL
BUN: 26 mg/dL — ABNORMAL HIGH (ref 6–23)
CO2: 39 mEq/L — ABNORMAL HIGH (ref 19–32)
Calcium: 11.8 mg/dL — ABNORMAL HIGH (ref 8.4–10.5)
Creatinine, Ser: 1.2 mg/dL (ref 0.4–1.2)
GFR: 53.79 mL/min — ABNORMAL LOW (ref >60.00)
Glucose, Bld: 85 mg/dL (ref 70–99)

## 2012-06-23 NOTE — Patient Instructions (Addendum)
Today we updated your med list in our EPIC system...    Continue your current medications the same...  Keep the fluid pill at 2 per day...  Today we did your follow up blood work...    We will call you w/ the results when avail...  For the "frying sound" in your ear>    Keep some background sounds on in the house- radio, TV, etc to "mask" the noise...  Stay as active as possible...    We will request FURTHER physical therapy & balance trianing from [facility] from [facility]...  Call for any questions...  Let's plan a follow up visit in 2-3 months.Jessica KitchenMarland [name]

## 2012-06-23 NOTE — Progress Notes (Signed)
Subjective:    Patient ID: Jessica [name], female    DOB: 01-14-1911, 78 y.o.   MRN: [id number]  HPI 78 y/o BF here for a follow up visit...  She has mult medical problems including:  AR & runny nose;  Asthmatic Bronchitis;  HBP;  Chronic VI & edema;  Hyperchol;  GERD & esoph stricture;  Divertics;  Hx Urinary incontinence & UTIs;  DJD/ Osteoporosis;  Semile Dementia;  Anxiety...  ~  December 26, 2010:  74mo ROV & clinically stable but she persists w/ mult somatic complaints:  hips> offered Ortho eval- "it's not that bad", has Mobic for Prn use;  runny nose: chr complaint, ?doesn't remember our numerous prev conversations- rec OTC Zyrtek;  "Jessica [name] needs his nerve pill"> Alpraz 0.25mg  written;  Jessica [name] wants labs done (ate strawberries & milk this AM)> routine labs all wnl, TChol is elev but HDL is 151!  ~  March 27, 2011:  74mo ROV & she's glad to be here because "things aren't doing to good"> c/o knees weak, knee pain (Mobic helps), noct leg discomfrt (Mobic helps this too), bladder trouble & she wants to see DrTannenbaum again as DrNesi hasn't been able to help her;  Wants to take Fish Oil- OK;  We reviewed her meds & her full labs from 1/12 w/ HDL= 151!!!  ~  May 30, 2011:  476mo ROV & she states "Oh, not doing so well"> c/o allergy & runny nose; feet & legs are burning & stinging (reviewed neuropathy w/ her & offered med but she declines meds at this time); wants more PT & balance training from the same [facility] therapist who comes to the house now for regular PT; wants power chair & I wrote Rx for [facility] to start the process; never went to see DrTannenbaum after last OV & she wants to resched this appt; finally she notes that Q64mo appts aren't satisfactory for her & she wants to be seen every 476months now...  ~  August 01, 2011:  476mo ROV & she reports that she's enjoyed the therapy, waiting for her power chair, & has appt w/ Urology next week...  She persists w/ mult somatic complaints> swelling in feet (edema is  actually down w/ Lasix 20mg - 2Qam & wt down 6# to 120# today); she notes that her nose runs "off & on for >67yr", legs achy & numb, back weak, wonders about allergy shots...  BP looks good; denies CP, palpit, syncope, ch in SOB, or cerebral ischemic symptoms...  She notes some constipation & we discussed Miralax & Senakot-S therapy...  She is really frustrated w/ [name]'s needs.  ~  October 03, 2011:  476mo ROV & here for face-to-face mobility eval per [facility] for her power wheelchair> forms completed & will be faxed to [facility] & scanned into the EMR... She states "Fair but not too good" today> notes that her legs are "nummy" & we discussed neuropathy & offered Gabapentin Rx but she doesn't want more pills;  C/o continued balance problems for which she had balance training which helped but she wants me to request MORE balance PT;  BP controlled, wt stable, persist LE edema L>R, etc...  She saw DrTannenbaum recently for Urology f/u w/ small hypersens bladder & large bladder diverticulum, not a surg candidate, taught "double voiding" tech; OK Flu vaccine, refills provided per request...  ~  December 18, 2011:  2-65mo ROV & she states "not doing too well" c/o weak (esp in AMs), "nummy" feeling in legs c/w neuropathy (  offer Gabapentin Rx & she will let me know), but notes that knees are better & the therapy is helping;  Wt is down 6# but she says appetite is good & she is eating well & resting well;  BP controlled on meds; On diet alone for Chol w/ VERY hi HDL; GI stable w/ Prilosec & Miralax/ Senakot-S; she saw DrTannenbaum for Urology 10/12- mult complaints, urodynamics done w/ sm hypersens bladder unstable w/ leakage, & large bladder divertic> he rec double voiding technique...  ~  March 25, 2012:  10mo ROV & Jocelynn is basically stable at 78 y/o; she notes some intermittent swelling in ankles & occas sharp CP laterally (min tender CWP)...  Jessica [name] is currently in rehab at [place] & I doubt she will be able to cqare for him  when the rehab stay is up...  Her CC is "just weak", eating OK, wt= 121#, walks w/ cane, no falls, denies SOB & manages ADLs ok...  We reviewed prob list, meds, xrays & labs>  See below>> LABS 3/13:  Chems- ok x Ca=11.1;  CBC- ok w/ Hg=13.2;  TSH=1.63;  BNP=101 We will need to check PTH in follow up...  ~  May 08, 2012:  6wk ROV & Jessica [name] returns w/ increased swelling in ankles and feet- she has known VI & periph edema that has been refractory to management w/ her attempts at low sodium diet, elevation & support hose; she also takes Lasix 20mg  starting at one Qam & increased to Bid; she notes that swelling tends to go down overnight & is worse late in the day; she has lost 3# to 118# since last OV yet she is very concerned & quite perturbed about the edema...  We discussed the need to check CXR & labs; we will change the Lasix to Demadex 20mg - 2 Qam & follow up 1 month... CXR 5/13 showed borderline heart size, kyphoscoliosis, no edema, and clear lungs... LABS 5/13:  Pending ==> she forgot to go to the lab this day.  ~  June 23, 2012:  6wk ROV & Akyla's edema is diminished on the Demadex20mg - 2 daily but she prefers to split it up 1AM & 1afternoon due to urination; she has mult additional symptoms, somatic complaints, etc> eg- c/o trouble w/ her ears w/ a noise "not a ringing, it' a frying" & noted less when active, distracted w/ background noise etc (we discussed tinnitus & masking the sound w/ "white noise");  She also wants additional Physical therapy & balance retraining from [facility] of [facility]...   daugh notes Jessica [name] is still in [place] but they may send him home soon...    We reviewed prob list, meds, xrays and labs> see below>> LABS 7/13:  Chems- HCO3=39, BUN=26, Creat=1.2, Ca=11.8, PTH=81... Rec- add DIAMOX 250mg /d, no calc supplements etc...        Problem List:  Hx of ALLERGIES (ICD-995.3) - she uses OTC meds as needed, & we prev discussed Zyrtek 10mg Qam, Saline nasal mist, & Atrovent Nasal  0.03% Tid for drainage (one of her chronic complaints)...  Hx of ASTHMATIC BRONCHITIS, ACUTE (ICD-466.0) - no recent exacerbations... ~  CXR 5/13 showed borderline heart size, kyphoscoliosis, no edema, and clear lungs...  HYPERTENSION (ICD-401.9) - controlled on ASA 81mg /d, NORVASC 5mg , LISINOPRIL 20mg /d & DEMADEX 20mg - 2 tabs/d... ~  4/12:  BP= 110/60 and she states that she is taking her meds regularly & tolerating them well... denies HA, visual changes, CP, palpit, change in dyspnea, etc... but notes weakness, intermittently  dizzy & persist edema in her feet...  ~  6/12:  BP= 124/70 & she remains stable... ~  8/12:  BP= 110/ 66 & stable... ~  10/12:  BP= 114/62 & stable... ~  1/13:  BP= 110/64 & she remains stable... ~  4/13:  BP= 116/58 & she is reminded to adjust Lasix 1-2 Qam according to her edema... ~  5/13:  BP= 140/72 & she is c/o pedal edema; we decided to change to Demadex 20mg - 2Qam. ~  7/13:  BP= 110/60 & her edema is diminished, still 1+ in feet... Labs showed HCO3=39 & DIAMOX 250mg /d added.  VENOUS INSUFFICIENCY, CHRONIC (ICD-459.81) - there is mild pedal edema bilat L>R... on low sodium diet, elevation, support hose- prev refused diuretic meds due to urinary symptoms, now on DEMADEX 20mg - 2Qam.  HYPERCHOLESTEROLEMIA (ICD-272.0) - prev on Lip10, but she stopped this on her own... she has a very high HDL!!! ~  FLP was 1/08 showed TChol 253, TG 125, HDL 121, LDL 93 ~  FLP 2/09 showed TChol 200, TG 99, HDL 136, LDL 44 ~  FLP 4/11 showed TChol 244, TG 143, HDL 149, LDL 59 ~  FLP 1/12 showed TChol 266, TG 102, HDL 151, LDL 83 >> BEST HDL I'VE EVER SEEN  HYPERCALCEMIA >> routine labs w/ Calcium levels betw 10.2 & 11.7 over the last 7yrs... ~  Labs 7/13 showed Ca= 11.8 &  PTH= 81... For now avoid calc supplements etc...  GERD (ICD-530.81) - last EGD was 9/05 by DrPerry showing GERD and esoph stricture- dilated... she stopped her Nexium but states that her swallowing is good & she  uses PRILOSEC OTC as needed.  DIVERTICULOSIS OF COLON (ICD-562.10) - last colonoscopy was 11/00 showing divertics only... ~  8/12:  She notes some constipation & rec to take [facility] & SENAKOT-S per our usual protocol...  HX, URINARY INFECTION (ICD-V13.02) & INCONTINENCE symptoms... she has seen DrTannenbaum & staff on bladder exercises and she reported some improvement but has persistant symptoms & never followed up... ~  7/10:  offered f/u appt w/ Urology vs second opinion consult but she declines... ~  9/10:  wrote for trial Enablex 7.5mg /d >> no benefit she says. ~  12/10: saw DrNesi- tried sample med, sl improved, but "he turned me over to a lady for longer term treatments" she says... ~  4/12 & 6/12: states DrNesi hasn't been able to help her & she wants appt w/ DrTannenbaum==> seen w/ urodynamic eval revealing a sm hypersens bladder & a large bladder divertic; not a surg candidate, taught double voiding technique... ~  10/12: she had f/u DrTannenbaum & he reinforced prev w/u & need for double voiding technique...  DEGENERATIVE JOINT DISEASE (ICD-715.90) - notes right shoulder symptoms, but managing OK w/ Mobic 7.5mg  & Tylenol; also right hip & low back pain...  she also c/o neuropathic "burning" in legs but not bad enough for additional meds she says...  OSTEOPOROSIS (ICD-733.00) - she was on Fosamax but had discontinued this on her own... we discussed the need for continued therapy and she was asked to restart Alendronate but she never did...  she takes Ca++, MVI, VitD==> rec to STOP the Calcium.  SENILE DEMENTIA (ICD-290.0) - she takes ASA 81mg /d... she tried Aricept in the past but didn't notice any improvement therefore stopped it. ~  she & her husb still live on their own but are having numerous difficulties while still resisting any thoughts of assisted living etc> got concerned because she saw  an impulse in her left antecubital fossa (tortuous brachial art), went to the ER & this got  translated to palpitations & lead to full ER cardiac eval= neg... ~  6/11: MMSE showed 26/30 & she agreed to try DONEPEZIL 10mg /d... ~  6/12:  Still taking Aricept & appears stable...  ANXIETY (ICD-300.00) - she uses Alpraz 0.25mg  as needed.  Hx of SHINGLES (ICD-053.9)   Past Surgical History  Procedure Date  . Vesicovaginal fistula closure w/ tah   . Tonsillectomy   . Abdominal hysterectomy     Outpatient Encounter Prescriptions as of 06/23/2012  Medication Sig Dispense Refill  . ALPRAZolam (XANAX) 0.25 MG tablet 1/2 -1 tablet by mouth three times a day as needed for nerves  90 tablet  5  . amLODipine (NORVASC) 5 MG tablet Take 1 tablet (5 mg total) by mouth daily.  90 tablet  3  . aspirin 81 MG tablet Take 81 mg by mouth daily.        . Calcium Carbonate-Vitamin D 600-400 MG-UNIT per tablet Take 1 tablet by mouth 2 (two) times daily.        . Cholecalciferol (VITAMIN D) 1000 UNITS capsule Take 1,000 Units by mouth daily.        Marland Kitchen donepezil (ARICEPT) 10 MG tablet Take 1 tablet (10 mg total) by mouth at bedtime as needed.  90 tablet  3  . lisinopril (PRINIVIL,ZESTRIL) 20 MG tablet Take 1 tablet (20 mg total) by mouth daily.  90 tablet  3  . meloxicam (MOBIC) 7.5 MG tablet 1 by mouth once daily w/ food as needed for joint/back pain       . Multiple Vitamin (MULTIVITAMIN) tablet Take 1 tablet by mouth daily.        Marland Kitchen torsemide (DEMADEX) 20 MG tablet Take 20 mg by mouth 2 (two) times daily. Take 2 tablets by mouth every morning      . DISCONTD: torsemide (DEMADEX) 20 MG tablet Take 2 tablets by mouth every morning  180 tablet  1    No Known Allergies   Current Medications, Allergies, Past Medical History, Past Surgical History, Family History, and Social History were reviewed in [employer] record.    Review of Systems        See HPI - all other systems neg except as noted...      The patient complains of decreased hearing, dyspnea on exertion, peripheral  edema, incontinence, muscle weakness, and difficulty walking.  The patient denies anorexia, fever, weight loss, weight gain, vision loss, hoarseness, chest pain, syncope, prolonged cough, headaches, hemoptysis, abdominal pain, melena, hematochezia, severe indigestion/heartburn, hematuria, suspicious skin lesions, transient blindness, depression, unusual weight change, abnormal bleeding, enlarged lymph nodes, and angioedema.     Objective:   Physical Exam      WD, WN, 78 y/o BF in NAD... GENERAL:  Alert, pleasant & cooperative; she is slow moving as noted... HEENT:  [place]/AT, EOM-full, EACs- some wax, TMs-wnl, NOSE-clear, THROAT-clear & wnl. NECK:  Supple w/ fairROM; no JVD; normal carotid impulses w/o bruits; no thyromegaly or nodules palpated; no lymphadenopathy. CHEST:  Clear to P & A; without wheezes/ rales/ or rhonchi., noted kyphosis... HEART:  Regular Rhythm; without murmurs/ rubs/ or gallops heard... ABDOMEN:  Soft & nontender; normal bowel sounds; no organomegaly or masses detected. EXT:  mod arthritic changes; no varicose veins/ +venous insuffic/ 1-2+edema... NEURO:   Alert, answers questions appropriately, but slowly, no focal neuro deficits... DERM:  No lesions noted; no rash etc..Marland Kitchen  MMSE> 06/13/10> Score 26/30 (missed place, 2/3 recall after distraction, & copy figure)...   Assessment & Plan:   AR & ASTHMA>  Breathing is stable, at baseline, she has chr rhinorrhea complaints w/ numerous discussions regarding management of this chronic problem....  HBP>  Controlled on meds, continue same... Chems show HCO3=39 on the Demadex, rec to add DIAMOX 250mg /d to start...  Ven insuffic>  Her CC is pedal edema, she does not grasp the idea of ven insuffic but says she's not eating salt; rec elevation, support hose & continue Demadex20mg -2Qam...  CHOL>  She has the world's best HDL!!!  Hypercalcemia>  She is instructed to stop her calcium supplement & we will recheck w/ PTH level on  return...  GU>  She saw DrTannenbaum w/ extensive investigation into her voiding symptoms & urodynamics as above; rec to use "double voiding" technique...  DJD/ Osteopenia>  Aware, she is stable on OTC meds, she has repeatedly refuse Bisphos meds for her bones...  Dementia>  She & husb Jessica [name] are still living independently & have been married 62 yrs.  Other medical problems as noted...   Patient's Medications  New Prescriptions   ACETAZOLAMIDE (DIAMOX) 250 MG TABLET    Take 1 tablet (250 mg total) by mouth daily. At 4 pm  Previous Medications   ALPRAZOLAM (XANAX) 0.25 MG TABLET    1/2 -1 tablet by mouth three times a day as needed for nerves   AMLODIPINE (NORVASC) 5 MG TABLET    Take 1 tablet (5 mg total) by mouth daily.   ASPIRIN 81 MG TABLET    Take 81 mg by mouth daily.     CHOLECALCIFEROL (VITAMIN D) 1000 UNITS CAPSULE    Take 1,000 Units by mouth daily.     DONEPEZIL (ARICEPT) 10 MG TABLET    Take 1 tablet (10 mg total) by mouth at bedtime as needed.   LISINOPRIL (PRINIVIL,ZESTRIL) 20 MG TABLET    Take 1 tablet (20 mg total) by mouth daily.   MELOXICAM (MOBIC) 7.5 MG TABLET    1 by mouth once daily w/ food as needed for joint/back pain    MULTIPLE VITAMIN (MULTIVITAMIN) TABLET    Take 1 tablet by mouth daily.    Modified Medications   Modified Medication Previous Medication   TORSEMIDE (DEMADEX) 20 MG TABLET torsemide (DEMADEX) 20 MG tablet      Take 20 mg by mouth 2 (two) times daily. Take 2 tablets by mouth every morning    Take 2 tablets by mouth every morning  Discontinued Medications   CALCIUM CARBONATE-VITAMIN D 600-400 MG-UNIT PER TABLET    Take 1 tablet by mouth 2 (two) times daily.

## 2012-06-24 LAB — PTH, INTACT AND CALCIUM
Calcium, Total (PTH): 11.8 mg/dL — ABNORMAL HIGH (ref 8.4–10.5)
PTH: 80.6 pg/mL — ABNORMAL HIGH (ref 14.0–72.0)

## 2012-06-25 ENCOUNTER — Other Ambulatory Visit: Payer: Self-pay | Admitting: Pulmonary Disease

## 2012-06-25 ENCOUNTER — Telehealth: Payer: Self-pay | Admitting: Pulmonary Disease

## 2012-06-25 ENCOUNTER — Other Ambulatory Visit: Payer: Self-pay | Admitting: *Deleted

## 2012-06-25 DIAGNOSIS — I10 Essential (primary) hypertension: Secondary | ICD-10-CM | POA: Diagnosis not present

## 2012-06-25 DIAGNOSIS — R269 Unspecified abnormalities of gait and mobility: Secondary | ICD-10-CM | POA: Diagnosis not present

## 2012-06-25 DIAGNOSIS — F0390 Unspecified dementia without behavioral disturbance: Secondary | ICD-10-CM | POA: Diagnosis not present

## 2012-06-25 DIAGNOSIS — IMO0001 Reserved for inherently not codable concepts without codable children: Secondary | ICD-10-CM | POA: Diagnosis not present

## 2012-06-25 DIAGNOSIS — F411 Generalized anxiety disorder: Secondary | ICD-10-CM | POA: Diagnosis not present

## 2012-06-25 DIAGNOSIS — M6281 Muscle weakness (generalized): Secondary | ICD-10-CM | POA: Diagnosis not present

## 2012-06-25 MED ORDER — ACETAZOLAMIDE 250 MG PO TABS: 250.0000 mg | ORAL_TABLET | Freq: Every day | ORAL | Status: DC

## 2012-06-25 NOTE — Telephone Encounter (Signed)
I spoke with Jessica [name] and she was calling to confirm the new medication added for pt to take bc pt did not have this written down. Nothing further was needed

## 2012-06-29 ENCOUNTER — Telehealth: Payer: Self-pay | Admitting: Pulmonary Disease

## 2012-06-29 DIAGNOSIS — IMO0001 Reserved for inherently not codable concepts without codable children: Secondary | ICD-10-CM | POA: Diagnosis not present

## 2012-06-29 DIAGNOSIS — F411 Generalized anxiety disorder: Secondary | ICD-10-CM | POA: Diagnosis not present

## 2012-06-29 DIAGNOSIS — M6281 Muscle weakness (generalized): Secondary | ICD-10-CM | POA: Diagnosis not present

## 2012-06-29 DIAGNOSIS — F0390 Unspecified dementia without behavioral disturbance: Secondary | ICD-10-CM | POA: Diagnosis not present

## 2012-06-29 DIAGNOSIS — R269 Unspecified abnormalities of gait and mobility: Secondary | ICD-10-CM | POA: Diagnosis not present

## 2012-06-29 DIAGNOSIS — I10 Essential (primary) hypertension: Secondary | ICD-10-CM | POA: Diagnosis not present

## 2012-06-29 NOTE — Telephone Encounter (Signed)
Per SN---will need to call the pt and shelby to make sure they are both aware and to have [facility] check her BP during the week.   Decrease the norvasc 5mg   To 1/2 tablet daily, lisinopril 20 mg   Take 1/2 tablet daily, demadex 20 mg   1 in the morning only.   thanks

## 2012-06-29 NOTE — Telephone Encounter (Signed)
Called and spoke with Jessica [name] from [facility] PT---she stated that the pt bp today was 92/58 today. No other symptoms.  She did state that the pt stated that she was a little more tired than usual today.  Has been eating and drinking well today.  Has not taken her fluid or BP meds today.  SN please advise. Thanks  No Known Allergies

## 2012-06-29 NOTE — Telephone Encounter (Signed)
LMTCB for [facility] with pt and notified of recs per SN. She verbalized understanding and states no questions.  Will await a call back from [place] to inform her.

## 2012-06-30 NOTE — Telephone Encounter (Signed)
Jessica [name] is aware of recs. Carron Curie, CMA

## 2012-07-01 ENCOUNTER — Ambulatory Visit: Payer: Medicare Other | Admitting: Pulmonary Disease

## 2012-07-01 ENCOUNTER — Telehealth: Payer: Self-pay | Admitting: Pulmonary Disease

## 2012-07-01 DIAGNOSIS — F411 Generalized anxiety disorder: Secondary | ICD-10-CM | POA: Diagnosis not present

## 2012-07-01 DIAGNOSIS — I10 Essential (primary) hypertension: Secondary | ICD-10-CM | POA: Diagnosis not present

## 2012-07-01 DIAGNOSIS — IMO0001 Reserved for inherently not codable concepts without codable children: Secondary | ICD-10-CM | POA: Diagnosis not present

## 2012-07-01 DIAGNOSIS — F0390 Unspecified dementia without behavioral disturbance: Secondary | ICD-10-CM | POA: Diagnosis not present

## 2012-07-01 DIAGNOSIS — M6281 Muscle weakness (generalized): Secondary | ICD-10-CM | POA: Diagnosis not present

## 2012-07-01 DIAGNOSIS — R269 Unspecified abnormalities of gait and mobility: Secondary | ICD-10-CM | POA: Diagnosis not present

## 2012-07-01 NOTE — Telephone Encounter (Signed)
1) metabolic changes from her Demadex diuretic> rec adding DIAMOX 250mg - take 1 daily at 4PM  I spoke with [facility] and advised her of this since pt did not know why she was taking the diamox. Nothing further was needed

## 2012-07-02 ENCOUNTER — Telehealth: Payer: Self-pay | Admitting: Pulmonary Disease

## 2012-07-02 DIAGNOSIS — IMO0001 Reserved for inherently not codable concepts without codable children: Secondary | ICD-10-CM | POA: Diagnosis not present

## 2012-07-02 DIAGNOSIS — F0390 Unspecified dementia without behavioral disturbance: Secondary | ICD-10-CM | POA: Diagnosis not present

## 2012-07-02 DIAGNOSIS — F411 Generalized anxiety disorder: Secondary | ICD-10-CM | POA: Diagnosis not present

## 2012-07-02 DIAGNOSIS — I10 Essential (primary) hypertension: Secondary | ICD-10-CM | POA: Diagnosis not present

## 2012-07-02 DIAGNOSIS — R269 Unspecified abnormalities of gait and mobility: Secondary | ICD-10-CM | POA: Diagnosis not present

## 2012-07-02 DIAGNOSIS — M6281 Muscle weakness (generalized): Secondary | ICD-10-CM | POA: Diagnosis not present

## 2012-07-02 NOTE — Telephone Encounter (Signed)
I spoke with Olegario Messier and she states there are several issues with the pt. The first is that they cannot get her to take the diamox 250mg  because the pt states that she has never taken something with such a high dosage.  Also the pt is not taking her BP meds correctly. The instructions from prev phone note was to take 1/2 tablet of Amlodipine 5mg , 1/2 lisinopril, and half torsemide. She states the pt is taking a whole tablet of amlodipine, and 1/2 tablet of torsemide. Today her BP was 106/58. She states they have instructed the pt on the correct way to take the meds, but Olegario Messier is not sure if she will. She states the pt is very overwhelmed with everything that is going on and is getting confused. She states she spoke with the pt and discussed with her about asssited living and the pt stated maybe it was time,  So Olegario Messier is asking can we place an order for a social work consult to help eval the pt to see what kind of services would help meet her needs.   Please advise. Carron Curie, CMA

## 2012-07-02 NOTE — Telephone Encounter (Signed)
Per SN ok to order social work consult.  I called the pt and reviewed her medications and the correct way to take them. She seemed to have a much better understanding of her BP meds and what the correct directions were supposed to be. I also advised the pt that we will be send ing an order for social work consult as well as hospice order for her husband Annalei Friesz. Pt states understanding. Order placed. I also made [facility] aware.  Carron Curie, CMA

## 2012-07-02 NOTE — Telephone Encounter (Signed)
Nurse also adds that she wants pt to have a Child psychotherapist see pt to help with setting up assisted living. Jessica [name]

## 2012-07-03 ENCOUNTER — Telehealth: Payer: Self-pay | Admitting: Pulmonary Disease

## 2012-07-03 NOTE — Telephone Encounter (Signed)
Spoke with patient- aware of how to take medications and which ones not to take. Nothing more needed.

## 2012-07-07 DIAGNOSIS — F411 Generalized anxiety disorder: Secondary | ICD-10-CM | POA: Diagnosis not present

## 2012-07-07 DIAGNOSIS — M6281 Muscle weakness (generalized): Secondary | ICD-10-CM | POA: Diagnosis not present

## 2012-07-07 DIAGNOSIS — R269 Unspecified abnormalities of gait and mobility: Secondary | ICD-10-CM | POA: Diagnosis not present

## 2012-07-07 DIAGNOSIS — IMO0001 Reserved for inherently not codable concepts without codable children: Secondary | ICD-10-CM | POA: Diagnosis not present

## 2012-07-07 DIAGNOSIS — I10 Essential (primary) hypertension: Secondary | ICD-10-CM | POA: Diagnosis not present

## 2012-07-07 DIAGNOSIS — F0390 Unspecified dementia without behavioral disturbance: Secondary | ICD-10-CM | POA: Diagnosis not present

## 2012-07-10 DIAGNOSIS — F411 Generalized anxiety disorder: Secondary | ICD-10-CM | POA: Diagnosis not present

## 2012-07-10 DIAGNOSIS — M6281 Muscle weakness (generalized): Secondary | ICD-10-CM | POA: Diagnosis not present

## 2012-07-10 DIAGNOSIS — I10 Essential (primary) hypertension: Secondary | ICD-10-CM | POA: Diagnosis not present

## 2012-07-10 DIAGNOSIS — F0390 Unspecified dementia without behavioral disturbance: Secondary | ICD-10-CM | POA: Diagnosis not present

## 2012-07-10 DIAGNOSIS — IMO0001 Reserved for inherently not codable concepts without codable children: Secondary | ICD-10-CM | POA: Diagnosis not present

## 2012-07-10 DIAGNOSIS — R269 Unspecified abnormalities of gait and mobility: Secondary | ICD-10-CM | POA: Diagnosis not present

## 2012-07-14 DIAGNOSIS — M6281 Muscle weakness (generalized): Secondary | ICD-10-CM

## 2012-07-14 DIAGNOSIS — F411 Generalized anxiety disorder: Secondary | ICD-10-CM | POA: Diagnosis not present

## 2012-07-14 DIAGNOSIS — F0390 Unspecified dementia without behavioral disturbance: Secondary | ICD-10-CM

## 2012-07-14 DIAGNOSIS — I10 Essential (primary) hypertension: Secondary | ICD-10-CM

## 2012-07-14 DIAGNOSIS — R269 Unspecified abnormalities of gait and mobility: Secondary | ICD-10-CM | POA: Diagnosis not present

## 2012-07-14 DIAGNOSIS — IMO0001 Reserved for inherently not codable concepts without codable children: Secondary | ICD-10-CM | POA: Diagnosis not present

## 2012-07-17 DIAGNOSIS — R269 Unspecified abnormalities of gait and mobility: Secondary | ICD-10-CM | POA: Diagnosis not present

## 2012-07-17 DIAGNOSIS — M6281 Muscle weakness (generalized): Secondary | ICD-10-CM | POA: Diagnosis not present

## 2012-07-17 DIAGNOSIS — I10 Essential (primary) hypertension: Secondary | ICD-10-CM | POA: Diagnosis not present

## 2012-07-17 DIAGNOSIS — IMO0001 Reserved for inherently not codable concepts without codable children: Secondary | ICD-10-CM | POA: Diagnosis not present

## 2012-07-17 DIAGNOSIS — F0390 Unspecified dementia without behavioral disturbance: Secondary | ICD-10-CM | POA: Diagnosis not present

## 2012-07-17 DIAGNOSIS — F411 Generalized anxiety disorder: Secondary | ICD-10-CM | POA: Diagnosis not present

## 2012-07-23 ENCOUNTER — Ambulatory Visit (INDEPENDENT_AMBULATORY_CARE_PROVIDER_SITE_OTHER): Payer: Medicare Other | Admitting: Pulmonary Disease

## 2012-07-23 ENCOUNTER — Encounter: Payer: Self-pay | Admitting: Pulmonary Disease

## 2012-07-23 VITALS — BP 118/64 | HR 52 | Temp 97.3°F | Ht 60.0 in | Wt 117.4 lb

## 2012-07-23 DIAGNOSIS — M1990 Unspecified osteoarthritis, unspecified site: Secondary | ICD-10-CM

## 2012-07-23 DIAGNOSIS — I10 Essential (primary) hypertension: Secondary | ICD-10-CM | POA: Diagnosis not present

## 2012-07-23 DIAGNOSIS — K5730 Diverticulosis of large intestine without perforation or abscess without bleeding: Secondary | ICD-10-CM

## 2012-07-23 DIAGNOSIS — R5383 Other fatigue: Secondary | ICD-10-CM

## 2012-07-23 DIAGNOSIS — G589 Mononeuropathy, unspecified: Secondary | ICD-10-CM

## 2012-07-23 DIAGNOSIS — F0390 Unspecified dementia without behavioral disturbance: Secondary | ICD-10-CM

## 2012-07-23 DIAGNOSIS — I872 Venous insufficiency (chronic) (peripheral): Secondary | ICD-10-CM

## 2012-07-23 DIAGNOSIS — R609 Edema, unspecified: Secondary | ICD-10-CM | POA: Diagnosis not present

## 2012-07-23 DIAGNOSIS — K219 Gastro-esophageal reflux disease without esophagitis: Secondary | ICD-10-CM

## 2012-07-23 DIAGNOSIS — M810 Age-related osteoporosis without current pathological fracture: Secondary | ICD-10-CM

## 2012-07-23 DIAGNOSIS — F411 Generalized anxiety disorder: Secondary | ICD-10-CM

## 2012-07-23 NOTE — Patient Instructions (Addendum)
Today we updated your med list in our EPIC system...    Continue your current medications the same...  Keep your NORVASC (Amlodipine) at 1/2 tab daily...  Keep your LISINOPRIL at 1/2 tab daily...  Add back the DEMADEX (Torsemide) at one tab in AM...  Keep the DIAMOX (Acetazolemide) at one tab each day at [facility]...  Continue your home therapy...  Let's plan a recheck w/ blood work in 1 month.Marland KitchenMarland Kitchen

## 2012-07-23 NOTE — Progress Notes (Signed)
Subjective:    Patient ID: Jessica [name], female    DOB: 01-14-1911, 78 y.o.   MRN: [id number]  HPI 78 y/o BF here for a follow up visit...  She has mult medical problems including:  AR & runny nose;  Asthmatic Bronchitis;  HBP;  Chronic VI & edema;  Hyperchol;  GERD & esoph stricture;  Divertics;  Hx Urinary incontinence & UTIs;  DJD/ Osteoporosis;  Semile Dementia;  Anxiety...  ~  December 26, 2010:  74mo ROV & clinically stable but she persists w/ mult somatic complaints:  hips> offered Ortho eval- "it's not that bad", has Mobic for Prn use;  runny nose: chr complaint, ?doesn't remember our numerous prev conversations- rec OTC Zyrtek;  "[name] needs his nerve pill"> Alpraz 0.25mg  written;  [name] wants labs done (ate strawberries & milk this AM)> routine labs all wnl, TChol is elev but HDL is 151!  ~  March 27, 2011:  74mo ROV & she's glad to be here because "things aren't doing to good"> c/o knees weak, knee pain (Mobic helps), noct leg discomfrt (Mobic helps this too), bladder trouble & she wants to see DrTannenbaum again as DrNesi hasn't been able to help her;  Wants to take Fish Oil- OK;  We reviewed her meds & her full labs from 1/12 w/ HDL= 151!!!  ~  May 30, 2011:  476mo ROV & she states "Oh, not doing so well"> c/o allergy & runny nose; feet & legs are burning & stinging (reviewed neuropathy w/ her & offered med but she declines meds at this time); wants more PT & balance training from the same [facility] therapist who comes to the house now for regular PT; wants power chair & I wrote Rx for [facility] to start the process; never went to see DrTannenbaum after last OV & she wants to resched this appt; finally she notes that Q64mo appts aren't satisfactory for her & she wants to be seen every 476months now...  ~  August 01, 2011:  476mo ROV & she reports that she's enjoyed the therapy, waiting for her power chair, & has appt w/ Urology next week...  She persists w/ mult somatic complaints> swelling in feet (edema is  actually down w/ Lasix 20mg - 2Qam & wt down 6# to 120# today); she notes that her nose runs "off & on for >67yr", legs achy & numb, back weak, wonders about allergy shots...  BP looks good; denies CP, palpit, syncope, ch in SOB, or cerebral ischemic symptoms...  She notes some constipation & we discussed Miralax & Senakot-S therapy...  She is really frustrated w/ [name]'s needs.  ~  October 03, 2011:  476mo ROV & here for face-to-face mobility eval per [facility] for her power wheelchair> forms completed & will be faxed to [facility] & scanned into the EMR... She states "Fair but not too good" today> notes that her legs are "nummy" & we discussed neuropathy & offered Gabapentin Rx but she doesn't want more pills;  C/o continued balance problems for which she had balance training which helped but she wants me to request MORE balance PT;  BP controlled, wt stable, persist LE edema L>R, etc...  She saw DrTannenbaum recently for Urology f/u w/ small hypersens bladder & large bladder diverticulum, not a surg candidate, taught "double voiding" tech; OK Flu vaccine, refills provided per request...  ~  December 18, 2011:  2-65mo ROV & she states "not doing too well" c/o weak (esp in AMs), "nummy" feeling in legs c/w neuropathy (  offer Gabapentin Rx & she will let me know), but notes that knees are better & the therapy is helping;  Wt is down 6# but she says appetite is good & she is eating well & resting well;  BP controlled on meds; On diet alone for Chol w/ VERY hi HDL; GI stable w/ Prilosec & Miralax/ Senakot-S; she saw DrTannenbaum for Urology 10/12- mult complaints, urodynamics done w/ sm hypersens bladder unstable w/ leakage, & large bladder divertic> he rec double voiding technique...  ~  March 25, 2012:  73mo ROV & Vanassa is basically stable at 78 y/o; she notes some intermittent swelling in ankles & occas sharp CP laterally (min tender CWP)...  [name] is currently in rehab at [place] & I doubt she will be able to cqare for him  when the rehab stay is up...  Her CC is "just weak", eating OK, wt= 121#, walks w/ cane, no falls, denies SOB & manages ADLs ok...  We reviewed prob list, meds, xrays & labs>  See below>> LABS 3/13:  Chems- ok x Ca=11.1;  CBC- ok w/ Hg=13.2;  TSH=1.63;  BNP=101 We will need to check PTH in follow up...  ~  May 08, 2012:  6wk ROV & Hadley returns w/ increased swelling in ankles and feet- she has known VI & periph edema that has been refractory to management w/ her attempts at low sodium diet, elevation & support hose; she also takes Lasix 20mg  starting at one Qam & increased to Bid; she notes that swelling tends to go down overnight & is worse late in the day; she has lost 3# to 118# since last OV yet she is very concerned & quite perturbed about the edema...  We discussed the need to check CXR & labs; we will change the Lasix to Demadex 20mg - 2 Qam & follow up 1 month... CXR 5/13 showed borderline heart size, kyphoscoliosis, no edema, and clear lungs... LABS 5/13:  Pending ==> she forgot to go to the lab this day.  ~  June 23, 2012:  6wk ROV & Tayonna's edema is diminished on the Demadex20mg - 2 daily but she prefers to split it up 1AM & 1afternoon due to urination; she has mult additional symptoms, somatic complaints, etc> eg- c/o trouble w/ her ears w/ a noise "not a ringing, it' a frying" & noted less when active, distracted w/ background noise etc (we discussed tinnitus & masking the sound w/ "white noise");  She also wants additional Physical therapy & balance retraining from [facility]...   daugh notes [name] is still in [place] but they may send him home soon...    We reviewed prob list, meds, xrays and labs> see below>> LABS 7/13:  Chems- HCO3=39, BUN=26, Creat=1.2, Ca=11.8, PTH=81... Rec- add DIAMOX 250mg /d, no calc supplements etc...  ~  July 23, 2012:  35mo ROV & Evamarie is doing the best she can at home w/ daughter's help but they have a hard time w/ her meds & pt's dementia... After  last visit she had more "therapy" but this too has stopped since she really "couldn't do it" per daughter... Edema has decr but persists in ankles on Demadex20-2/d (?if taking) & Diamox250/d added; note that weight is the same at 116-117# today...     We reviewed prob list, meds, xrays and labs> see below for update >> We decided to simplify her regimen> Norvasc1/2, Lisinopril1/2, Demadex- one daily, Diamox- one daily...        Problem List:  Hx of ALLERGIES (ICD-995.3) - she uses OTC meds as needed, & we prev discussed Zyrtek 10mg Qam, Saline nasal mist, & Atrovent Nasal 0.03% Tid for drainage (one of her chronic complaints)...  Hx of ASTHMATIC BRONCHITIS, ACUTE (ICD-466.0) - no recent exacerbations... ~  CXR 5/13 showed borderline heart size, kyphoscoliosis, no edema, and clear lungs...  HYPERTENSION (ICD-401.9) - controlled on ASA 81mg /d, NORVASC5mg -1/2tab, LISINOPRIL 20mg - 1/2tab & DEMADEX 20mg /d & DIAMOX250mg /d... ~  4/12:  BP= 110/60 and she states that she is taking her meds regularly & tolerating them well... denies HA, visual changes, CP, palpit, change in dyspnea, etc... but notes weakness, intermittently dizzy & persist edema in her feet...  ~  6/12:  BP= 124/70 & she remains stable... ~  8/12:  BP= 110/ 66 & stable... ~  10/12:  BP= 114/62 & stable... ~  1/13:  BP= 110/64 & she remains stable... ~  4/13:  BP= 116/58 & she is reminded to adjust Lasix 1-2 Qam according to her edema... ~  5/13:  BP= 140/72 & she is c/o pedal edema; we decided to change to Demadex 20mg - 2Qam. ~  7/13:  BP= 110/60 & her edema is diminished, still 1+ in feet... Labs showed HCO3=39 & DIAMOX 250mg /d added. ~  8/13:  BP= 118/74 & edema is the same; ?what she's taking? & we decided to simplify regimen> Norvasc1/2, Lisin1/2, Demadex-1/d & Diamox-1/d...  VENOUS INSUFFICIENCY, CHRONIC (ICD-459.81) - there is mild pedal edema bilat L>R... on low sodium diet, elevation, support hose- prev refused diuretic meds due  to urinary symptoms, now on DEMADEX 20mg   HYPERCHOLESTEROLEMIA (ICD-272.0) - prev on Lip10, but she stopped this on her own... she has a very high HDL!!! ~  FLP was 1/08 showed TChol 253, TG 125, HDL 121, LDL 93 ~  FLP 2/09 showed TChol 200, TG 99, HDL 136, LDL 44 ~  FLP 4/11 showed TChol 244, TG 143, HDL 149, LDL 59 ~  FLP 1/12 showed TChol 266, TG 102, HDL 151, LDL 83 >> BEST HDL I'VE EVER SEEN  HYPERCALCEMIA >> routine labs w/ Calcium levels betw 10.2 & 11.7 over the last 2yrs... ~  Labs 7/13 showed Ca= 11.8 &  PTH= 81... For now avoid calc supplements etc...  GERD (ICD-530.81) - last EGD was 9/05 by DrPerry showing GERD and esoph stricture- dilated... she stopped her Nexium but states that her swallowing is good & she uses PRILOSEC OTC as needed.  DIVERTICULOSIS OF COLON (ICD-562.10) - last colonoscopy was 11/00 showing divertics only... ~  8/12:  She notes some constipation & rec to take [facility] & SENAKOT-S per our usual protocol...  HX, URINARY INFECTION (ICD-V13.02) & INCONTINENCE symptoms... she has seen DrTannenbaum & staff on bladder exercises and she reported some improvement but has persistant symptoms & never followed up... ~  7/10:  offered f/u appt w/ Urology vs second opinion consult but she declines... ~  9/10:  wrote for trial Enablex 7.5mg /d >> no benefit she says. ~  12/10: saw DrNesi- tried sample med, sl improved, but "he turned me over to a lady for longer term treatments" she says... ~  4/12 & 6/12: states DrNesi hasn't been able to help her & she wants appt w/ DrTannenbaum==> seen w/ urodynamic eval revealing a sm hypersens bladder & a large bladder divertic; not a surg candidate, taught double voiding technique... ~  10/12: she had f/u DrTannenbaum & he reinforced prev w/u & need for double voiding technique...  DEGENERATIVE JOINT DISEASE (ICD-715.90) - notes  right shoulder symptoms, but managing OK w/ Mobic 7.5mg  & Tylenol; also right hip & low back pain...  she also  c/o neuropathic "burning" in legs but not bad enough for additional meds she says...  OSTEOPOROSIS (ICD-733.00) - she was on Fosamax but had discontinued this on her own... we discussed the need for continued therapy and she was asked to restart Alendronate but she never did...  she takes Ca++, MVI, VitD==> rec to STOP the Calcium.  SENILE DEMENTIA (ICD-290.0) - she takes ASA 81mg /d... she tried Aricept in the past but didn't notice any improvement therefore stopped it. ~  she & her husb still live on their own but are having numerous difficulties while still resisting any thoughts of assisted living etc> got concerned because she saw an impulse in her left antecubital fossa (tortuous brachial art), went to the ER & this got translated to palpitations & lead to full ER cardiac eval= neg... ~  6/11: MMSE showed 26/30 & she agreed to try DONEPEZIL 10mg /d... ~  6/12:  Still taking Aricept & appears stable...  ANXIETY (ICD-300.00) - she uses Alpraz 0.25mg  as needed.  Hx of SHINGLES (ICD-053.9)   Past Surgical History  Procedure Date  . Vesicovaginal fistula closure w/ tah   . Tonsillectomy   . Abdominal hysterectomy     Outpatient Encounter Prescriptions as of 07/23/2012  Medication Sig Dispense Refill  . acetaZOLAMIDE (DIAMOX) 250 MG tablet Take 1 tablet (250 mg total) by mouth daily. At 4 pm  30 tablet  6  . ALPRAZolam (XANAX) 0.25 MG tablet 1/2 -1 tablet by mouth three times a day as needed for nerves  90 tablet  5  . amLODipine (NORVASC) 5 MG tablet Take 1 tablet (5 mg total) by mouth daily.  90 tablet  3  . aspirin 81 MG tablet Take 81 mg by mouth daily.        . Cholecalciferol (VITAMIN D) 1000 UNITS capsule Take 1,000 Units by mouth daily.        Marland Kitchen donepezil (ARICEPT) 10 MG tablet Take 1 tablet (10 mg total) by mouth at bedtime as needed.  90 tablet  3  . lisinopril (PRINIVIL,ZESTRIL) 20 MG tablet Take 1 tablet (20 mg total) by mouth daily.  90 tablet  3  . meloxicam (MOBIC) 7.5 MG  tablet 1 by mouth once daily w/ food as needed for joint/back pain       . Multiple Vitamin (MULTIVITAMIN) tablet Take 1 tablet by mouth daily.        Marland Kitchen torsemide (DEMADEX) 20 MG tablet Take 20 mg by mouth 2 (two) times daily. Take 2 tablets by mouth every morning        No Known Allergies   Current Medications, Allergies, Past Medical History, Past Surgical History, Family History, and Social History were reviewed in [employer] record.    Review of Systems        See HPI - all other systems neg except as noted...      The patient complains of decreased hearing, dyspnea on exertion, peripheral edema, incontinence, muscle weakness, and difficulty walking.  The patient denies anorexia, fever, weight loss, weight gain, vision loss, hoarseness, chest pain, syncope, prolonged cough, headaches, hemoptysis, abdominal pain, melena, hematochezia, severe indigestion/heartburn, hematuria, suspicious skin lesions, transient blindness, depression, unusual weight change, abnormal bleeding, enlarged lymph nodes, and angioedema.     Objective:   Physical Exam      WD, WN, 78 y/o BF in NAD.Marland KitchenMarland Kitchen  GENERAL:  Alert, pleasant & cooperative; she is slow moving as noted... HEENT:  [place]/AT, EOM-full, EACs- some wax, TMs-wnl, NOSE-clear, THROAT-clear & wnl. NECK:  Supple w/ fairROM; no JVD; normal carotid impulses w/o bruits; no thyromegaly or nodules palpated; no lymphadenopathy. CHEST:  Clear to P & A; without wheezes/ rales/ or rhonchi., noted kyphosis... HEART:  Regular Rhythm; without murmurs/ rubs/ or gallops heard... ABDOMEN:  Soft & nontender; normal bowel sounds; no organomegaly or masses detected. EXT:  mod arthritic changes; no varicose veins/ +venous insuffic/ 1-2+edema... NEURO:   Alert, answers questions appropriately, but slowly, no focal neuro deficits... DERM:  No lesions noted; no rash etc...  MMSE> 06/13/10> Score 26/30 (missed place, 2/3 recall after distraction, & copy  figure)...   Assessment & Plan:   AR & ASTHMA>  Breathing is stable, at baseline, she has chr rhinorrhea complaints w/ numerous discussions regarding management of this chronic problem....  HBP>  Controlled on meds, continue same... Chems show HCO3=39 on the Demadex, rec to add DIAMOX 250mg /d to start...  Ven insuffic>  Her CC is pedal edema, she does not grasp the idea of ven insuffic but says she's not eating salt; rec elevation, support hose & continue Demadex20mg -2Qam...  CHOL>  She has the world's best HDL!!!  Hypercalcemia>  She is instructed to stop her calcium supplement & we will recheck w/ PTH level on return...  GU>  She saw DrTannenbaum w/ extensive investigation into her voiding symptoms & urodynamics as above; rec to use "double voiding" technique...  DJD/ Osteopenia>  Aware, she is stable on OTC meds, she has repeatedly refuse Bisphos meds for her bones...  Dementia>  She & husb [name] are still living independently & have been married 62 yrs.  Other medical problems as noted...   Patient's Medications  New Prescriptions   No medications on file  Previous Medications   ACETAZOLAMIDE (DIAMOX) 250 MG TABLET    Take 1 tablet (250 mg total) by mouth daily. At 4 pm   ALPRAZOLAM (XANAX) 0.25 MG TABLET    1/2 -1 tablet by mouth three times a day as needed for nerves   ASPIRIN 81 MG TABLET    Take 81 mg by mouth daily.     CHOLECALCIFEROL (VITAMIN D) 1000 UNITS CAPSULE    Take 1,000 Units by mouth daily.     DONEPEZIL (ARICEPT) 10 MG TABLET    Take 1 tablet (10 mg total) by mouth at bedtime as needed.   MELOXICAM (MOBIC) 7.5 MG TABLET    1 by mouth once daily w/ food as needed for joint/back pain    MULTIPLE VITAMIN (MULTIVITAMIN) TABLET    Take 1 tablet by mouth daily.     TORSEMIDE (DEMADEX) 20 MG TABLET    Take 1 tablet by mouth every morning  Modified Medications   Modified Medication Previous Medication   AMLODIPINE (NORVASC) 5 MG TABLET amLODipine (NORVASC) 5 MG  tablet      Take 1/2 tablet by mouth once daily    Take 1 tablet (5 mg total) by mouth daily.   LISINOPRIL (PRINIVIL,ZESTRIL) 20 MG TABLET lisinopril (PRINIVIL,ZESTRIL) 20 MG tablet      Take 1/2 tablet by mouth daily    Take 1 tablet (20 mg total) by mouth daily.  Discontinued Medications   No medications on file

## 2012-07-26 ENCOUNTER — Encounter: Payer: Self-pay | Admitting: Pulmonary Disease

## 2012-07-29 ENCOUNTER — Telehealth: Payer: Self-pay | Admitting: Pulmonary Disease

## 2012-07-29 DIAGNOSIS — F411 Generalized anxiety disorder: Secondary | ICD-10-CM

## 2012-07-29 DIAGNOSIS — I10 Essential (primary) hypertension: Secondary | ICD-10-CM

## 2012-07-29 DIAGNOSIS — G589 Mononeuropathy, unspecified: Secondary | ICD-10-CM

## 2012-07-29 NOTE — Telephone Encounter (Signed)
ATC pt line rang several times and no one answered and did not receive a VM. WCb

## 2012-07-30 NOTE — Telephone Encounter (Signed)
Order has been placed for [facility] to come out to check pts BP and monitor swelling.  Nothing further is needed.

## 2012-07-30 NOTE — Telephone Encounter (Signed)
I spoke with the pt and she states she wants Dr. Kriste Basque to order for a home health nurse to come and visit her. She states she had the service in the past through [facility] but it has expired. She wants someone to come out that can check her BP and help her monitor her swelling. Please advise. Carron Curie, CMA No Known Allergies

## 2012-08-04 DIAGNOSIS — R609 Edema, unspecified: Secondary | ICD-10-CM | POA: Diagnosis not present

## 2012-08-04 DIAGNOSIS — G609 Hereditary and idiopathic neuropathy, unspecified: Secondary | ICD-10-CM | POA: Diagnosis not present

## 2012-08-04 DIAGNOSIS — F411 Generalized anxiety disorder: Secondary | ICD-10-CM | POA: Diagnosis not present

## 2012-08-04 DIAGNOSIS — I10 Essential (primary) hypertension: Secondary | ICD-10-CM | POA: Diagnosis not present

## 2012-08-04 DIAGNOSIS — R269 Unspecified abnormalities of gait and mobility: Secondary | ICD-10-CM | POA: Diagnosis not present

## 2012-08-04 DIAGNOSIS — J449 Chronic obstructive pulmonary disease, unspecified: Secondary | ICD-10-CM | POA: Diagnosis not present

## 2012-08-05 ENCOUNTER — Other Ambulatory Visit: Payer: Self-pay | Admitting: Pulmonary Disease

## 2012-08-05 MED ORDER — ALPRAZOLAM 0.25 MG PO TABS: ORAL_TABLET | ORAL | Status: DC

## 2012-08-07 DIAGNOSIS — H353190 Nonexudative age-related macular degeneration, unspecified eye, stage unspecified: Secondary | ICD-10-CM | POA: Diagnosis not present

## 2012-08-07 DIAGNOSIS — H4011X0 Primary open-angle glaucoma, stage unspecified: Secondary | ICD-10-CM | POA: Diagnosis not present

## 2012-08-07 DIAGNOSIS — H43399 Other vitreous opacities, unspecified eye: Secondary | ICD-10-CM | POA: Diagnosis not present

## 2012-08-07 DIAGNOSIS — H1045 Other chronic allergic conjunctivitis: Secondary | ICD-10-CM | POA: Diagnosis not present

## 2012-08-07 DIAGNOSIS — H2510 Age-related nuclear cataract, unspecified eye: Secondary | ICD-10-CM | POA: Diagnosis not present

## 2012-08-07 DIAGNOSIS — Z961 Presence of intraocular lens: Secondary | ICD-10-CM | POA: Diagnosis not present

## 2012-08-10 ENCOUNTER — Telehealth: Payer: Self-pay | Admitting: Pulmonary Disease

## 2012-08-10 NOTE — Telephone Encounter (Signed)
lmtcb x1 

## 2012-08-11 NOTE — Telephone Encounter (Signed)
Lm on Jessica [name]'s named VM TCB

## 2012-08-13 NOTE — Telephone Encounter (Signed)
I spoke with Morrie Sheldon and gave verbal OK to do PT/OT for pt. Carron Curie, CMA

## 2012-08-25 ENCOUNTER — Ambulatory Visit (INDEPENDENT_AMBULATORY_CARE_PROVIDER_SITE_OTHER): Payer: Medicare Other | Admitting: Pulmonary Disease

## 2012-08-25 ENCOUNTER — Encounter: Payer: Self-pay | Admitting: Pulmonary Disease

## 2012-08-25 ENCOUNTER — Other Ambulatory Visit (INDEPENDENT_AMBULATORY_CARE_PROVIDER_SITE_OTHER): Payer: Medicare Other

## 2012-08-25 VITALS — BP 110/70 | HR 60 | Temp 98.3°F | Ht 60.0 in | Wt 114.8 lb

## 2012-08-25 DIAGNOSIS — M1990 Unspecified osteoarthritis, unspecified site: Secondary | ICD-10-CM

## 2012-08-25 DIAGNOSIS — I10 Essential (primary) hypertension: Secondary | ICD-10-CM

## 2012-08-25 DIAGNOSIS — R5383 Other fatigue: Secondary | ICD-10-CM | POA: Diagnosis not present

## 2012-08-25 DIAGNOSIS — I872 Venous insufficiency (chronic) (peripheral): Secondary | ICD-10-CM | POA: Diagnosis not present

## 2012-08-25 DIAGNOSIS — R5381 Other malaise: Secondary | ICD-10-CM

## 2012-08-25 DIAGNOSIS — R609 Edema, unspecified: Secondary | ICD-10-CM | POA: Diagnosis not present

## 2012-08-25 DIAGNOSIS — E7800 Pure hypercholesterolemia, unspecified: Secondary | ICD-10-CM | POA: Diagnosis not present

## 2012-08-25 DIAGNOSIS — F411 Generalized anxiety disorder: Secondary | ICD-10-CM

## 2012-08-25 DIAGNOSIS — K219 Gastro-esophageal reflux disease without esophagitis: Secondary | ICD-10-CM

## 2012-08-25 DIAGNOSIS — M810 Age-related osteoporosis without current pathological fracture: Secondary | ICD-10-CM

## 2012-08-25 DIAGNOSIS — K5730 Diverticulosis of large intestine without perforation or abscess without bleeding: Secondary | ICD-10-CM

## 2012-08-25 DIAGNOSIS — F0390 Unspecified dementia without behavioral disturbance: Secondary | ICD-10-CM

## 2012-08-25 LAB — CBC WITH DIFFERENTIAL/PLATELET
Basophils Relative: 0.8 % (ref 0.0–3.0)
Eosinophils Absolute: 0.1 10*3/uL (ref 0.0–0.7)
Eosinophils Relative: 1.5 % (ref 0.0–5.0)
HCT: 38.9 % (ref 36.0–46.0)
Lymphs Abs: 1.5 10*3/uL (ref 0.7–4.0)
MCHC: 32.2 g/dL (ref 30.0–36.0)
MCV: 86.6 fl (ref 78.0–100.0)
Monocytes Absolute: 0.7 10*3/uL (ref 0.1–1.0)
Neutro Abs: 3 10*3/uL (ref 1.4–7.7)
RBC: 4.49 Mil/uL (ref 3.87–5.11)
WBC: 5.3 10*3/uL (ref 4.5–10.5)

## 2012-08-25 LAB — BASIC METABOLIC PANEL
CO2: 33 mEq/L — ABNORMAL HIGH (ref 19–32)
Chloride: 101 mEq/L (ref 96–112)
Potassium: 3.4 mEq/L — ABNORMAL LOW (ref 3.5–5.1)

## 2012-08-25 NOTE — Patient Instructions (Addendum)
Today we updated your med list in our EPIC system...    Continue your current medications the same...  Stop the Vit D supplement for now please...  Today we did your follow up blood work...    We will call you w/ the report...  We also gave you the 2013 flu vaccine today...  Let's plan a follow up visit in 2 months.Marland KitchenMarland Kitchen

## 2012-08-25 NOTE — Progress Notes (Signed)
Subjective:    Patient ID: Jessica [name], female    DOB: 01-14-1911, 78 y.o.   MRN: [id number]  HPI 78 y/o BF here for a follow up visit...  She has mult medical problems including:  AR & runny nose;  Asthmatic Bronchitis;  HBP;  Chronic VI & edema;  Hyperchol;  GERD & esoph stricture;  Divertics;  Hx Urinary incontinence & UTIs;  DJD/ Osteoporosis;  Semile Dementia;  Anxiety...  ~  December 26, 2010:  74mo ROV & clinically stable but she persists w/ mult somatic complaints:  hips> offered Ortho eval- "it's not that bad", has Mobic for Prn use;  runny nose: chr complaint, ?doesn't remember our numerous prev conversations- rec OTC Zyrtek;  "[name] needs his nerve pill"> Alpraz 0.25mg  written;  [name] wants labs done (ate strawberries & milk this AM)> routine labs all wnl, TChol is elev but HDL is 151!  ~  March 27, 2011:  74mo ROV & she's glad to be here because "things aren't doing to good"> c/o knees weak, knee pain (Mobic helps), noct leg discomfrt (Mobic helps this too), bladder trouble & she wants to see DrTannenbaum again as DrNesi hasn't been able to help her;  Wants to take Fish Oil- OK;  We reviewed her meds & her full labs from 1/12 w/ HDL= 151!!!  ~  May 30, 2011:  476mo ROV & she states "Oh, not doing so well"> c/o allergy & runny nose; feet & legs are burning & stinging (reviewed neuropathy w/ her & offered med but she declines meds at this time); wants more PT & balance training from the same [facility] therapist who comes to the house now for regular PT; wants power chair & I wrote Rx for [facility] to start the process; never went to see DrTannenbaum after last OV & she wants to resched this appt; finally she notes that Q64mo appts aren't satisfactory for her & she wants to be seen every 476months now...  ~  August 01, 2011:  476mo ROV & she reports that she's enjoyed the therapy, waiting for her power chair, & has appt w/ Urology next week...  She persists w/ mult somatic complaints> swelling in feet (edema is  actually down w/ Lasix 20mg - 2Qam & wt down 6# to 120# today); she notes that her nose runs "off & on for >67yr", legs achy & numb, back weak, wonders about allergy shots...  BP looks good; denies CP, palpit, syncope, ch in SOB, or cerebral ischemic symptoms...  She notes some constipation & we discussed Miralax & Senakot-S therapy...  She is really frustrated w/ [name]'s needs.  ~  October 03, 2011:  476mo ROV & here for face-to-face mobility eval per [facility] for her power wheelchair> forms completed & will be faxed to [facility] & scanned into the EMR... She states "Fair but not too good" today> notes that her legs are "nummy" & we discussed neuropathy & offered Gabapentin Rx but she doesn't want more pills;  C/o continued balance problems for which she had balance training which helped but she wants me to request MORE balance PT;  BP controlled, wt stable, persist LE edema L>R, etc...  She saw DrTannenbaum recently for Urology f/u w/ small hypersens bladder & large bladder diverticulum, not a surg candidate, taught "double voiding" tech; OK Flu vaccine, refills provided per request...  ~  December 18, 2011:  2-65mo ROV & she states "not doing too well" c/o weak (esp in AMs), "nummy" feeling in legs c/w neuropathy (  offer Gabapentin Rx & she will let me know), but notes that knees are better & the therapy is helping;  Wt is down 6# but she says appetite is good & she is eating well & resting well;  BP controlled on meds; On diet alone for Chol w/ VERY hi HDL; GI stable w/ Prilosec & Miralax/ Senakot-S; she saw DrTannenbaum for Urology 10/12- mult complaints, urodynamics done w/ sm hypersens bladder unstable w/ leakage, & large bladder divertic> he rec double voiding technique...  ~  March 25, 2012:  37mo ROV & Annaleise is basically stable at 78 y/o; she notes some intermittent swelling in ankles & occas sharp CP laterally (min tender CWP)...  [name] is currently in rehab at [facility] & I doubt she will be able to cqare for him  when the rehab stay is up...  Her CC is "just weak", eating OK, wt= 121#, walks w/ cane, no falls, denies SOB & manages ADLs ok...  We reviewed prob list, meds, xrays & labs>  See below>> LABS 3/13:  Chems- ok x Ca=11.1;  CBC- ok w/ Hg=13.2;  TSH=1.63;  BNP=101 We will need to check PTH in follow up...  ~  May 08, 2012:  6wk ROV & Nargis returns w/ increased swelling in ankles and feet- she has known VI & periph edema that has been refractory to management w/ her attempts at low sodium diet, elevation & support hose; she also takes Lasix 20mg  starting at one Qam & increased to Bid; she notes that swelling tends to go down overnight & is worse late in the day; she has lost 3# to 118# since last OV yet she is very concerned & quite perturbed about the edema...  We discussed the need to check CXR & labs; we will change the Lasix to Demadex 20mg - 2 Qam & follow up 1 month... CXR 5/13 showed borderline heart size, kyphoscoliosis, no edema, and clear lungs... LABS 5/13:  Pending ==> she forgot to go to the lab this day.  ~  June 23, 2012:  6wk ROV & Blaike's edema is diminished on the Demadex20mg - 2 daily but she prefers to split it up 1AM & 1afternoon due to urination; she has mult additional symptoms, somatic complaints, etc> eg- c/o trouble w/ her ears w/ a noise "not a ringing, it' a frying" & noted less when active, distracted w/ background noise etc (we discussed tinnitus & masking the sound w/ "white noise");  She also wants additional Physical therapy & balance retraining from [facility]...   daugh notes [name] is still in [place] but they may send him home soon...    We reviewed prob list, meds, xrays and labs> see below>> LABS 7/13:  Chems- HCO3=39, BUN=26, Creat=1.2, Ca=11.8, PTH=81... Rec- add DIAMOX 250mg /d, no calc supplements etc...  ~  July 23, 2012:  65mo ROV & Janete is doing the best she can at home w/ daughter's help but they have a hard time w/ her meds & pt's dementia... After  last visit she had more "therapy" but this too has stopped since she really "couldn't do it" per daughter... Edema has decr but persists in ankles on Demadex20-2/d (?if taking) & Diamox250/d added; note that weight is the same at 116-117# today...     We reviewed prob list, meds, xrays and labs> see below for update >> We decided to simplify her regimen> Norvasc1/2, Lisinopril1/2, Demadex- one daily, Diamox- one daily...  ~  August 25, 2012:  65mo ROV &  Elleanna states "Sometimes I feel like I'm doing better" and "today is not one of my worst days"; weight is down 2-3# to 115# today on her Demadex20 & Diamox250; she notes incr stress because [name] is back home & there is a lot on her- son comes in early AM hours, then a lady helps during the day, daugh in the afternoon, meals on wheels, etc...     VI, Edema> feet are still swelling, on Demadex20, Diamox250, no salt/ elevation/ support hose/ etc; labs showed stable renal w/ Cr=1.1 but K=3.4 & we will start K10Bid...    Hypercalcemia> repeat Ca=11.0 (sl improved) and PTH July2013 was 81; we are folowing & plan repeat PTH in 4-69mo; she knows to avoid calcium supplements & stop VitD as well.    Dementia> she is very slow & deliberate; I continue to feel that she & husb need NHP vs AL etc but they refuse to consider alternative living arrangements... We reviewed prob list, meds, xrays and labs> see below for updates >> LABS 9/13:  Chems- ok w/ K=3.4 (adding K10Bid), BS=90, BUN=17 Cr=1.1, Ca=11.0;  CBC- ok w/ Hg=12.5         Problem List:  Hx of ALLERGIES (ICD-995.3) - she uses OTC meds as needed, & we prev discussed Zyrtek 10mg Qam, Saline nasal mist, & Atrovent Nasal 0.03% Tid for drainage (one of her chronic complaints)...  Hx of ASTHMATIC BRONCHITIS, ACUTE (ICD-466.0) - no recent exacerbations... ~  CXR 5/13 showed borderline heart size, kyphoscoliosis, no edema, and clear lungs...  HYPERTENSION (ICD-401.9) - controlled on ASA 81mg /d,  NORVASC5mg -1/2tab, LISINOPRIL 20mg - 1/2tab & DEMADEX 20mg /d & DIAMOX250mg /d... ~  4/12:  BP= 110/60 and she states that she is taking her meds regularly & tolerating them well... denies HA, visual changes, CP, palpit, change in dyspnea, etc... but notes weakness, intermittently dizzy & persist edema in her feet...  ~  6/12:  BP= 124/70 & she remains stable... ~  8/12:  BP= 110/ 66 & stable... ~  10/12:  BP= 114/62 & stable... ~  1/13:  BP= 110/64 & she remains stable... ~  4/13:  BP= 116/58 & she is reminded to adjust Lasix 1-2 Qam according to her edema... ~  5/13:  BP= 140/72 & she is c/o pedal edema; we decided to change to Demadex 20mg - 2Qam. ~  7/13:  BP= 110/60 & her edema is diminished, still 1+ in feet... Labs showed HCO3=39 & DIAMOX 250mg /d added. ~  8/13:  BP= 118/74 & edema is the same; ?what she's taking? & we decided to simplify regimen> Norvasc1/2, Lisin1/2, Demadex-1/d & Diamox-1/d... ~  9/13:  BP= 110/70 & she is clinically stable on these meds; add K10Bid for the K=3.4 today...  VENOUS INSUFFICIENCY, CHRONIC (ICD-459.81) - there is mild pedal edema bilat L>R... on low sodium diet, elevation, support hose- prev refused diuretic meds due to urinary symptoms, now on DEMADEX 20mg   HYPERCHOLESTEROLEMIA (ICD-272.0) - prev on Lip10, but she stopped this on her own... she has a very high HDL!!! ~  FLP was 1/08 showed TChol 253, TG 125, HDL 121, LDL 93 ~  FLP 2/09 showed TChol 200, TG 99, HDL 136, LDL 44 ~  FLP 4/11 showed TChol 244, TG 143, HDL 149, LDL 59 ~  FLP 1/12 showed TChol 266, TG 102, HDL 151, LDL 83 >> BEST HDL I'VE EVER SEEN  HYPERCALCEMIA >> routine labs w/ Calcium levels betw 10.2 & 11.7 over the last 31yrs... ~  Labs 7/13 showed Ca= 11.8 &  PTH= 81... For now avoid calc supplements etc... ~  Labs 9/13 showed Ca= 11.0 & she is reminded- no calcium, no VitD...  GERD (ICD-530.81) - last EGD was 9/05 by DrPerry showing GERD and esoph stricture- dilated... she stopped her  Nexium but states that her swallowing is good & she uses PRILOSEC OTC as needed.  DIVERTICULOSIS OF COLON (ICD-562.10) - last colonoscopy was 11/00 showing divertics only... ~  8/12:  She notes some constipation & rec to take [facility] & SENAKOT-S per our usual protocol...  HX, URINARY INFECTION (ICD-V13.02) & INCONTINENCE symptoms... she has seen DrTannenbaum & staff on bladder exercises and she reported some improvement but has persistant symptoms & never followed up... ~  7/10:  offered f/u appt w/ Urology vs second opinion consult but she declines... ~  9/10:  wrote for trial Enablex 7.5mg /d >> no benefit she says. ~  12/10: saw DrNesi- tried sample med, sl improved, but "he turned me over to a lady for longer term treatments" she says... ~  4/12 & 6/12: states DrNesi hasn't been able to help her & she wants appt w/ DrTannenbaum==> seen w/ urodynamic eval revealing a sm hypersens bladder & a large bladder divertic; not a surg candidate, taught double voiding technique... ~  10/12: she had f/u DrTannenbaum & he reinforced prev w/u & need for double voiding technique...  DEGENERATIVE JOINT DISEASE (ICD-715.90) - notes right shoulder symptoms, but managing OK w/ Mobic 7.5mg  & Tylenol; also right hip & low back pain...  she also c/o neuropathic "burning" in legs but not bad enough for additional meds she says...  OSTEOPOROSIS (ICD-733.00) - she was on Fosamax but had discontinued this on her own... we discussed the need for continued therapy and she was asked to restart Alendronate but she never did...  she takes Ca++, MVI, VitD==> rec to STOP the Calcium.  SENILE DEMENTIA (ICD-290.0) - she takes ASA 81mg /d... she tried Aricept in the past but didn't notice any improvement therefore stopped it. ~  she & her husb still live on their own but are having numerous difficulties while still resisting any thoughts of assisted living etc> got concerned because she saw an impulse in her left antecubital fossa  (tortuous brachial art), went to the ER & this got translated to palpitations & lead to full ER cardiac eval= neg... ~  6/11: MMSE showed 26/30 & she agreed to try DONEPEZIL 10mg /d... ~  6/12:  Still taking Aricept & appears stable... ~  7/13:  No change, same meds, clinically stable...  ANXIETY (ICD-300.00) - she uses Alpraz 0.25mg  as needed.  Hx of SHINGLES (ICD-053.9)   Past Surgical History  Procedure Date  . Vesicovaginal fistula closure w/ tah   . Tonsillectomy   . Abdominal hysterectomy     Outpatient Encounter Prescriptions as of 08/25/2012  Medication Sig Dispense Refill  . acetaZOLAMIDE (DIAMOX) 250 MG tablet Take 1 tablet (250 mg total) by mouth daily. At 4 pm  30 tablet  6  . ALPRAZolam (XANAX) 0.25 MG tablet 1/2 -1 tablet by mouth three times a day as needed for nerves  90 tablet  5  . amLODipine (NORVASC) 5 MG tablet       . aspirin 81 MG tablet Take 81 mg by mouth daily.        . Cholecalciferol (VITAMIN D) 1000 UNITS capsule Take 1,000 Units by mouth daily.        Marland Kitchen donepezil (ARICEPT) 10 MG tablet Take 1 tablet (10 mg total)  by mouth at bedtime as needed.  90 tablet  3  . lisinopril (PRINIVIL,ZESTRIL) 20 MG tablet Take 1/2 tablet by mouth daily      . meloxicam (MOBIC) 7.5 MG tablet 1 by mouth once daily w/ food as needed for joint/back pain       . Multiple Vitamin (MULTIVITAMIN) tablet Take 1 tablet by mouth daily.        Marland Kitchen torsemide (DEMADEX) 20 MG tablet Take 1 tablet by mouth every morning      . DISCONTD: amLODipine (NORVASC) 5 MG tablet TAKE 1 TABLET (5 MG TOTAL) BY MOUTH DAILY.  90 tablet  3  . DISCONTD: amLODipine (NORVASC) 5 MG tablet Take 1/2 tablet by mouth once daily        No Known Allergies   Current Medications, Allergies, Past Medical History, Past Surgical History, Family History, and Social History were reviewed in [employer] record.    Review of Systems        See HPI - all other systems neg except as noted...       The patient complains of decreased hearing, dyspnea on exertion, peripheral edema, incontinence, muscle weakness, and difficulty walking.  The patient denies anorexia, fever, weight loss, weight gain, vision loss, hoarseness, chest pain, syncope, prolonged cough, headaches, hemoptysis, abdominal pain, melena, hematochezia, severe indigestion/heartburn, hematuria, suspicious skin lesions, transient blindness, depression, unusual weight change, abnormal bleeding, enlarged lymph nodes, and angioedema.     Objective:   Physical Exam      WD, WN, 78 y/o BF in NAD... GENERAL:  Alert, pleasant & cooperative; she is slow moving as noted... HEENT:  [place]/AT, EOM-full, EACs- some wax, TMs-wnl, NOSE-clear, THROAT-clear & wnl. NECK:  Supple w/ fairROM; no JVD; normal carotid impulses w/o bruits; no thyromegaly or nodules palpated; no lymphadenopathy. CHEST:  Clear to P & A; without wheezes/ rales/ or rhonchi., noted kyphosis... HEART:  Regular Rhythm; without murmurs/ rubs/ or gallops heard... ABDOMEN:  Soft & nontender; normal bowel sounds; no organomegaly or masses detected. EXT:  mod arthritic changes; no varicose veins/ +venous insuffic/ 1-2+edema... NEURO:   Alert, answers questions appropriately, but slowly, no focal neuro deficits... DERM:  No lesions noted; no rash etc...  MMSE> 06/13/10> Score 26/30 (missed place, 2/3 recall after distraction, & copy figure)...   Assessment & Plan:    AR & ASTHMA>  Breathing is stable, at baseline, she has chr rhinorrhea complaints w/ numerous discussions regarding management of this chronic problem....  HBP>  Controlled on meds, continue same... Chems showed HCO3=39 on the Demadex, added DIAMOX 250mg /d & improved to 33...  Ven insuffic>  Her CC is pedal edema, she does not grasp the idea of ven insuffic but says she's not eating salt; rec elevation, support hose & continue Demadex20mg Qam & Diamox250Qpm.  CHOL>  She has the world's best HDL!!!  Hypercalcemia>   She is instructed to stop her calcium supplement & we will recheck w/ PTH level in 4-34mo.  GU>  She saw DrTannenbaum w/ extensive investigation into her voiding symptoms & urodynamics as above; rec to use "double voiding" technique...  DJD/ Osteopenia>  Aware, she is stable on OTC meds, she has repeatedly refuse Bisphos meds for her bones...  Dementia>  She & husb [name] are still living independently & have been married 62 yrs.  Other medical problems as noted...   Patient's Medications  New Prescriptions   POTASSIUM CHLORIDE (K-DUR) 10 MEQ TABLET    Take 1 tablet (10 mEq total)  by mouth 2 (two) times daily.  Previous Medications   ACETAZOLAMIDE (DIAMOX) 250 MG TABLET    Take 1 tablet (250 mg total) by mouth daily. At 4 pm   ALPRAZOLAM (XANAX) 0.25 MG TABLET    1/2 -1 tablet by mouth three times a day as needed for nerves   ASPIRIN 81 MG TABLET    Take 81 mg by mouth daily.     DONEPEZIL (ARICEPT) 10 MG TABLET    Take 1 tablet (10 mg total) by mouth at bedtime as needed.   LISINOPRIL (PRINIVIL,ZESTRIL) 20 MG TABLET    Take 1/2 tablet by mouth daily   MELOXICAM (MOBIC) 7.5 MG TABLET    1 by mouth once daily w/ food as needed for joint/back pain    MULTIPLE VITAMIN (MULTIVITAMIN) TABLET    Take 1 tablet by mouth daily.     TORSEMIDE (DEMADEX) 20 MG TABLET    Take 1 tablet by mouth every morning  Modified Medications   Modified Medication Previous Medication   AMLODIPINE (NORVASC) 5 MG TABLET amLODipine (NORVASC) 5 MG tablet      Take 1/2 tablet by mouth daily    TAKE 1 TABLET (5 MG TOTAL) BY MOUTH DAILY.  Discontinued Medications   AMLODIPINE (NORVASC) 5 MG TABLET    Take 1/2 tablet by mouth once daily   CHOLECALCIFEROL (VITAMIN D) 1000 UNITS CAPSULE    Take 1,000 Units by mouth daily.

## 2012-08-26 ENCOUNTER — Other Ambulatory Visit: Payer: Self-pay | Admitting: Pulmonary Disease

## 2012-08-26 MED ORDER — POTASSIUM CHLORIDE ER 10 MEQ PO TBCR: 10.0000 meq | EXTENDED_RELEASE_TABLET | Freq: Two times a day (BID) | ORAL | Status: DC

## 2012-08-28 ENCOUNTER — Telehealth: Payer: Self-pay | Admitting: Pulmonary Disease

## 2012-08-28 NOTE — Telephone Encounter (Signed)
I spoke with Tiffanty and advised her according to SN last OV note pt was to take demadex 20 mg QD. She stated pt has been taking 1/2 tablet daily but will make her aware she needs to take 1 whole tablet daily. Nothing further was needed

## 2012-08-31 ENCOUNTER — Telehealth: Payer: Self-pay | Admitting: Pulmonary Disease

## 2012-08-31 ENCOUNTER — Ambulatory Visit (INDEPENDENT_AMBULATORY_CARE_PROVIDER_SITE_OTHER): Payer: Medicare Other | Admitting: Adult Health

## 2012-08-31 ENCOUNTER — Other Ambulatory Visit (INDEPENDENT_AMBULATORY_CARE_PROVIDER_SITE_OTHER): Payer: Medicare Other

## 2012-08-31 ENCOUNTER — Encounter: Payer: Self-pay | Admitting: Adult Health

## 2012-08-31 VITALS — BP 108/60 | HR 49 | Temp 97.0°F | Ht 60.0 in | Wt 114.0 lb

## 2012-08-31 DIAGNOSIS — I10 Essential (primary) hypertension: Secondary | ICD-10-CM

## 2012-08-31 DIAGNOSIS — M1990 Unspecified osteoarthritis, unspecified site: Secondary | ICD-10-CM

## 2012-08-31 LAB — BASIC METABOLIC PANEL
BUN: 18 mg/dL (ref 6–23)
Calcium: 11.2 mg/dL — ABNORMAL HIGH (ref 8.4–10.5)
Creatinine, Ser: 1.1 mg/dL (ref 0.4–1.2)
GFR: 56.5 mL/min — ABNORMAL LOW (ref >60.00)
Glucose, Bld: 95 mg/dL (ref 70–99)

## 2012-08-31 NOTE — Telephone Encounter (Signed)
I spoke with pt and she stated she isn't feeling very well today. Stated normally she can get around with just using her cane but now she has to use her walker-knee's feel very weak, both of her feet/legs are swollen, and she has this nervous feeling. She has been keeping her legs/fett elevated and is taking her torsemide 20 mg QD. Her legs are slightly warm but not severely swollen. She is requesting further recs from SN or asked if Dr. Kriste Basque would call her personally to speak with her. Please advise Dr. Kriste Basque thanks

## 2012-08-31 NOTE — Progress Notes (Signed)
Subjective:    Patient ID: Jessica [name], female    DOB: [birth year], 78 y.o.   MRN: [id number]  HPI 78 y/o BF She has mult medical problems including:  AR & runny nose;  Asthmatic Bronchitis;  HBP;  Chronic VI & edema;  Hyperchol;  GERD & esoph stricture;  Divertics;  Hx Urinary incontinence & UTIs;  DJD/ Osteoporosis;  Semile Dementia;  Anxiety...  ~  December 26, 2010:  546mo ROV & clinically stable but she persists w/ mult somatic complaints:  hips> offered Ortho eval- "it's not that bad", has Mobic for Prn use;  runny nose: chr complaint, ?doesn't remember our numerous prev conversations- rec OTC Zyrtek;  "[name] needs his nerve pill"> Alpraz 0.25mg  written;  Jannae wants labs done (ate strawberries & milk this AM)> routine labs all wnl, TChol is elev but HDL is 151!  ~  March 27, 2011:  546mo ROV & she's glad to be here because "things aren't doing to good"> c/o knees weak, knee pain (Mobic helps), noct leg discomfrt (Mobic helps this too), bladder trouble & she wants to see DrTannenbaum again as DrNesi hasn't been able to help her;  Wants to take Fish Oil- OK;  We reviewed her meds & her full labs from 1/12 w/ HDL= 151!!!  ~  May 30, 2011:  46mo ROV & she states "Oh, not doing so well"> c/o allergy & runny nose; feet & legs are burning & stinging (reviewed neuropathy w/ her & offered med but she declines meds at this time); wants more PT & balance training from the same [facility] therapist who comes to the house now for regular PT; wants power chair & I wrote Rx for [facility] to start the process; never went to see DrTannenbaum after last OV & she wants to resched this appt; finally she notes that Q30mo appts aren't satisfactory for her & she wants to be seen every 46months now...  ~  August 01, 2011:  46mo ROV & she reports that she's enjoyed the therapy, waiting for her power chair, & has appt w/ Urology next week...  She persists w/ mult somatic complaints> swelling in feet (edema is actually down w/ Lasix 20mg -  2Qam & wt down 6# to 120# today); she notes that her nose runs "off & on for >39yr", legs achy & numb, back weak, wonders about allergy shots...  BP looks good; denies CP, palpit, syncope, ch in SOB, or cerebral ischemic symptoms...  She notes some constipation & we discussed Miralax & Senakot-S therapy...  She is really frustrated w/ [name]'s needs.  ~  October 03, 2011:  46mo ROV & here for face-to-face mobility eval per [facility] for her power wheelchair> forms completed & will be faxed to [facility] & scanned into the EMR... She states "Fair but not too good" today> notes that her legs are "nummy" & we discussed neuropathy & offered Gabapentin Rx but she doesn't want more pills;  C/o continued balance problems for which she had balance training which helped but she wants me to request MORE balance PT;  BP controlled, wt stable, persist LE edema L>R, etc...  She saw DrTannenbaum recently for Urology f/u w/ small hypersens bladder & large bladder diverticulum, not a surg candidate, taught "double voiding" tech; OK Flu vaccine, refills provided per request...  ~  December 18, 2011:  2-21mo ROV & she states "not doing too well" c/o weak (esp in AMs), "nummy" feeling in legs c/w neuropathy (offer Gabapentin Rx & she will let  me know), but notes that knees are better & the therapy is helping;  Wt is down 6# but she says appetite is good & she is eating well & resting well;  BP controlled on meds; On diet alone for Chol w/ VERY hi HDL; GI stable w/ Prilosec & Miralax/ Senakot-S; she saw DrTannenbaum for Urology 10/12- mult complaints, urodynamics done w/ sm hypersens bladder unstable w/ leakage, & large bladder divertic> he rec double voiding technique...  ~  March 25, 2012:  25mo ROV & Shalonda is basically stable at 78 y/o; she notes some intermittent swelling in ankles & occas sharp CP laterally (min tender CWP)...  [name] is currently in rehab at [facility] & I doubt she will be able to cqare for him when the rehab stay is up...   Her CC is "just weak", eating OK, wt= 121#, walks w/ cane, no falls, denies SOB & manages ADLs ok...  We reviewed prob list, meds, xrays & labs>  See below>> LABS 3/13:  Chems- ok x Ca=11.1;  CBC- ok w/ Hg=13.2;  TSH=1.63;  BNP=101 We will need to check PTH in follow up...  ~  May 08, 2012:  6wk ROV & Nia returns w/ increased swelling in ankles and feet- she has known VI & periph edema that has been refractory to management w/ her attempts at low sodium diet, elevation & support hose; she also takes Lasix 20mg  starting at one Qam & increased to Bid; she notes that swelling tends to go down overnight & is worse late in the day; she has lost 3# to 118# since last OV yet she is very concerned & quite perturbed about the edema...  We discussed the need to check CXR & labs; we will change the Lasix to Demadex 20mg - 2 Qam & follow up 1 month... CXR 5/13 showed borderline heart size, kyphoscoliosis, no edema, and clear lungs... LABS 5/13:  Pending ==> she forgot to go to the lab this day.  ~  June 23, 2012:  6wk ROV & Amoy's edema is diminished on the Demadex20mg - 2 daily but she prefers to split it up 1AM & 1afternoon due to urination; she has mult additional symptoms, somatic complaints, etc> eg- c/o trouble w/ her ears w/ a noise "not a ringing, it' a frying" & noted less when active, distracted w/ background noise etc (we discussed tinnitus & masking the sound w/ "white noise");  She also wants additional Physical therapy & balance retraining from [facility]...   daugh notes [name] is still in [place] but they may send him home soon...    We reviewed prob list, meds, xrays and labs> see below>> LABS 7/13:  Chems- HCO3=39, BUN=26, Creat=1.2, Ca=11.8, PTH=81... Rec- add DIAMOX 250mg /d, no calc supplements etc...  ~  July 23, 2012:  20mo ROV & Kineta is doing the best she can at home w/ daughter's help but they have a hard time w/ her meds & pt's dementia... After last visit she had more "therapy"  but this too has stopped since she really "couldn't do it" per daughter... Edema has decr but persists in ankles on Demadex20-2/d (?if taking) & Diamox250/d added; note that weight is the same at 116-117# today...     We reviewed prob list, meds, xrays and labs> see below for update >> We decided to simplify her regimen> Norvasc1/2, Lisinopril1/2, Demadex- one daily, Diamox- one daily...  ~  August 25, 2012:  20mo ROV & Jhoana states "Sometimes I feel like I'm  doing better" and "today is not one of my worst days"; weight is down 2-3# to 115# today on her Demadex20 & Diamox250; she notes incr stress because [name] is back home & there is a lot on her- son comes in early AM hours, then a lady helps during the day, daugh in the afternoon, meals on wheels, etc...     VI, Edema> feet are still swelling, on Demadex20, Diamox250, no salt/ elevation/ support hose/ etc; labs showed stable renal w/ Cr=1.1 but K=3.4 & we will start K10Bid...    Hypercalcemia> repeat Ca=11.0 (sl improved) and PTH July2013 was 81; we are folowing & plan repeat PTH in 4-36mo; she knows to avoid calcium supplements & stop VitD as well.    Dementia> she is very slow & deliberate; I continue to feel that she & husb need NHP vs AL etc but they refuse to consider alternative living arrangements... We reviewed prob list, meds, xrays and labs> see below for updates >> LABS 9/13:  Chems- ok w/ K=3.4 (adding K10Bid), BS=90, BUN=17 Cr=1.1, Ca=11.0;  CBC- ok w/ Hg=12.5  08/31/12 Folllow up  Pt comes in family member requests assistance at home  Just weak and off balance at times Worn out with family recently , had a lot of people visiting her recently.  Legs get weak easily.  No fever or chest pain. No falls.  Wants labs today to make sure her K+ is better Seen last week with low K on labs.  rx sent to pharm for kdur.         Problem List:  Hx of ALLERGIES (ICD-995.3) - she uses OTC meds as needed, & we prev discussed Zyrtek 10mg Qam,  Saline nasal mist, & Atrovent Nasal 0.03% Tid for drainage (one of her chronic complaints)...  Hx of ASTHMATIC BRONCHITIS, ACUTE (ICD-466.0) - no recent exacerbations... ~  CXR 5/13 showed borderline heart size, kyphoscoliosis, no edema, and clear lungs...  HYPERTENSION (ICD-401.9) - controlled on ASA 81mg /d, NORVASC5mg -1/2tab, LISINOPRIL 20mg - 1/2tab & DEMADEX 20mg /d & DIAMOX250mg /d... ~  4/12:  BP= 110/60 and she states that she is taking her meds regularly & tolerating them well... denies HA, visual changes, CP, palpit, change in dyspnea, etc... but notes weakness, intermittently dizzy & persist edema in her feet...  ~  6/12:  BP= 124/70 & she remains stable... ~  8/12:  BP= 110/ 66 & stable... ~  10/12:  BP= 114/62 & stable... ~  1/13:  BP= 110/64 & she remains stable... ~  4/13:  BP= 116/58 & she is reminded to adjust Lasix 1-2 Qam according to her edema... ~  5/13:  BP= 140/72 & she is c/o pedal edema; we decided to change to Demadex 20mg - 2Qam. ~  7/13:  BP= 110/60 & her edema is diminished, still 1+ in feet... Labs showed HCO3=39 & DIAMOX 250mg /d added. ~  8/13:  BP= 118/74 & edema is the same; ?what she's taking? & we decided to simplify regimen> Norvasc1/2, Lisin1/2, Demadex-1/d & Diamox-1/d... ~  9/13:  BP= 110/70 & she is clinically stable on these meds; add K10Bid for the K=3.4 today...  VENOUS INSUFFICIENCY, CHRONIC (ICD-459.81) - there is mild pedal edema bilat L>R... on low sodium diet, elevation, support hose- prev refused diuretic meds due to urinary symptoms, now on DEMADEX 20mg   HYPERCHOLESTEROLEMIA (ICD-272.0) - prev on Lip10, but she stopped this on her own... she has a very high HDL!!! ~  FLP was 1/08 showed TChol 253, TG 125, HDL 121, LDL 93 ~  FLP 2/09 showed TChol 200, TG 99, HDL 136, LDL 44 ~  FLP 4/11 showed TChol 244, TG 143, HDL 149, LDL 59 ~  FLP 1/12 showed TChol 266, TG 102, HDL 151, LDL 83 >> BEST HDL I'VE EVER SEEN  HYPERCALCEMIA >> routine labs w/ Calcium  levels betw 10.2 & 11.7 over the last 4yrs... ~  Labs 7/13 showed Ca= 11.8 &  PTH= 81... For now avoid calc supplements etc... ~  Labs 9/13 showed Ca= 11.0 & she is reminded- no calcium, no VitD...  GERD (ICD-530.81) - last EGD was 9/05 by DrPerry showing GERD and esoph stricture- dilated... she stopped her Nexium but states that her swallowing is good & she uses PRILOSEC OTC as needed.  DIVERTICULOSIS OF COLON (ICD-562.10) - last colonoscopy was 11/00 showing divertics only... ~  8/12:  She notes some constipation & rec to take [facility] & SENAKOT-S per our usual protocol...  HX, URINARY INFECTION (ICD-V13.02) & INCONTINENCE symptoms... she has seen DrTannenbaum & staff on bladder exercises and she reported some improvement but has persistant symptoms & never followed up... ~  7/10:  offered f/u appt w/ Urology vs second opinion consult but she declines... ~  9/10:  wrote for trial Enablex 7.5mg /d >> no benefit she says. ~  12/10: saw DrNesi- tried sample med, sl improved, but "he turned me over to a lady for longer term treatments" she says... ~  4/12 & 6/12: states DrNesi hasn't been able to help her & she wants appt w/ DrTannenbaum==> seen w/ urodynamic eval revealing a sm hypersens bladder & a large bladder divertic; not a surg candidate, taught double voiding technique... ~  10/12: she had f/u DrTannenbaum & he reinforced prev w/u & need for double voiding technique...  DEGENERATIVE JOINT DISEASE (ICD-715.90) - notes right shoulder symptoms, but managing OK w/ Mobic 7.5mg  & Tylenol; also right hip & low back pain...  she also c/o neuropathic "burning" in legs but not bad enough for additional meds she says...  OSTEOPOROSIS (ICD-733.00) - she was on Fosamax but had discontinued this on her own... we discussed the need for continued therapy and she was asked to restart Alendronate but she never did...  she takes Ca++, MVI, VitD==> rec to STOP the Calcium.  SENILE DEMENTIA (ICD-290.0) - she  takes ASA 81mg /d... she tried Aricept in the past but didn't notice any improvement therefore stopped it. ~  she & her husb still live on their own but are having numerous difficulties while still resisting any thoughts of assisted living etc> got concerned because she saw an impulse in her left antecubital fossa (tortuous brachial art), went to the ER & this got translated to palpitations & lead to full ER cardiac eval= neg... ~  6/11: MMSE showed 26/30 & she agreed to try DONEPEZIL 10mg /d... ~  6/12:  Still taking Aricept & appears stable... ~  7/13:  No change, same meds, clinically stable...  ANXIETY (ICD-300.00) - she uses Alpraz 0.25mg  as needed.  Hx of SHINGLES (ICD-053.9)   Past Surgical History  Procedure Date  . Vesicovaginal fistula closure w/ tah   . Tonsillectomy   . Abdominal hysterectomy     Outpatient Encounter Prescriptions as of 08/31/2012  Medication Sig Dispense Refill  . acetaZOLAMIDE (DIAMOX) 250 MG tablet Take 1 tablet (250 mg total) by mouth daily. At 4 pm  30 tablet  6  . ALPRAZolam (XANAX) 0.25 MG tablet 1/2 -1 tablet by mouth three times a day as needed for nerves  90 tablet  5  . amLODipine (NORVASC) 5 MG tablet Take 1/2 tablet by mouth daily      . aspirin 81 MG tablet Take 81 mg by mouth daily.        Marland Kitchen donepezil (ARICEPT) 10 MG tablet Take 1 tablet (10 mg total) by mouth at bedtime as needed.  90 tablet  3  . lisinopril (PRINIVIL,ZESTRIL) 20 MG tablet Take 1/2 tablet by mouth daily      . meloxicam (MOBIC) 7.5 MG tablet 1 by mouth once daily w/ food as needed for joint/back pain       . Multiple Vitamin (MULTIVITAMIN) tablet Take 1 tablet by mouth daily.        . potassium chloride (K-DUR) 10 MEQ tablet Take 1 tablet (10 mEq total) by mouth 2 (two) times daily.  60 tablet  11  . torsemide (DEMADEX) 20 MG tablet Take 1 tablet by mouth every morning        No Known Allergies   Current Medications, Allergies, Past Medical History, Past Surgical History,  Family History, and Social History were reviewed in [employer] record.    Review of Systems        See HPI - all other systems neg except as noted...      The patient complains of decreased hearing, dyspnea on exertion, peripheral edema, incontinence, muscle weakness, and difficulty walking.  The patient denies anorexia, fever, weight loss, weight gain, vision loss, hoarseness, chest pain, syncope, prolonged cough, headaches, hemoptysis, abdominal pain, melena, hematochezia, severe indigestion/heartburn, hematuria, suspicious skin lesions, transient blindness, depression, unusual weight change, abnormal bleeding, enlarged lymph nodes, and angioedema.     Objective:   Physical Exam      WD, WN, 78 y/o BF in NAD... GENERAL:  Alert, pleasant & cooperative; she is slow moving as noted... HEENT:  [place]/AT, EOM-full, EACs- some wax, TMs-wnl, NOSE-clear, THROAT-clear & wnl. NECK:  Supple w/ fairROM; no JVD; normal carotid impulses w/o bruits; no thyromegaly or nodules palpated; no lymphadenopathy. CHEST:  Clear to P & A; without wheezes/ rales/ or rhonchi., noted kyphosis... HEART:  Regular Rhythm; without murmurs/ rubs/ or gallops heard... ABDOMEN:  Soft & nontender; normal bowel sounds; no organomegaly or masses detected. EXT:  mod arthritic changes; no varicose veins/ +venous insuffic/ 1-2+edema... NEURO:   Alert,  no focal neuro deficits... DERM:  No lesions noted; no rash etc...    Assessment & Plan:

## 2012-08-31 NOTE — Patient Instructions (Addendum)
We are sending an order for Physical Therapy at home and In Home assessment for assistance.  I will call with lab results.  Ensure drink Twice daily  Between meals.  Follow up Dr. Kriste Basque  As planned and As needed

## 2012-08-31 NOTE — Telephone Encounter (Signed)
OV with TP today

## 2012-09-02 ENCOUNTER — Other Ambulatory Visit: Payer: Self-pay | Admitting: Adult Health

## 2012-09-02 DIAGNOSIS — F0390 Unspecified dementia without behavioral disturbance: Secondary | ICD-10-CM

## 2012-09-02 DIAGNOSIS — I10 Essential (primary) hypertension: Secondary | ICD-10-CM

## 2012-09-03 NOTE — Assessment & Plan Note (Signed)
DJD with progressive weakness /frail  Check labs  DME referral   Plan  Will send orders for [facility] to assess for in home assitance along with PT to help with strengthening and gait imbalances  I will call with lab results.  Ensure drink Twice daily  Between meals.  Follow up Dr. Kriste Basque  As planned and As needed

## 2012-09-09 ENCOUNTER — Telehealth: Payer: Self-pay | Admitting: Pulmonary Disease

## 2012-09-09 NOTE — Telephone Encounter (Signed)
Notes Recorded by Julio Sicks, NP on 09/01/2012 at 3:20 PM Make sure she is taking her Kdur  Need to increase to in am and 10 meq at night , repeat in 2 weeks bmet /lab only.  Please contact office for sooner follow up if symptoms do not improve or worsen or seek emergency care   Pt aware of how to take medication.

## 2012-09-13 ENCOUNTER — Telehealth: Payer: Self-pay | Admitting: Internal Medicine

## 2012-09-13 NOTE — Telephone Encounter (Signed)
On call- Patient reports taking usual early AM meds today, then N&V. No new meds, pain or blood. Around 11AM ate and took next scheduled meds, then napped. Woke for bathroom and felt weak and nausea while up. Vomited again. Now just weak. Son there and daughter coming.  Suspect gastroenteritis. Recommended bedrest or chair today. Sips of clear liquids if thirsty. Wait on solid food and meds till better. Call office tomorrow.

## 2012-09-14 ENCOUNTER — Telehealth: Payer: Self-pay | Admitting: Pulmonary Disease

## 2012-09-14 MED ORDER — POTASSIUM CHLORIDE ER 10 MEQ PO TBCR: EXTENDED_RELEASE_TABLET | ORAL | Status: DC

## 2012-09-14 NOTE — Telephone Encounter (Signed)
Called and spoke with pt and she is aware that the potassium has been sent to the pharmacy for her with refills.

## 2012-09-16 ENCOUNTER — Telehealth: Payer: Self-pay | Admitting: Pulmonary Disease

## 2012-09-16 NOTE — Telephone Encounter (Signed)
Called, spoke with pt.   1.  Would like to know if she is still supposed to come in tomorrow to check her urine -- I do not see anything mentioned about this but pt states she was told approx 4 wks ago she needed to come in tomorrow.  Dr. Kriste Basque, pls advise.  Thank you. 2.  States [facility] has not been out in a week to check on her.  States the last time they came out her BP was 100/64.  She would like them to come back out and check her BP.  Also, states she can "hardly get around because I'm so weak" and requesting [facility] to come out to help.  I have called Diane and Morrie Sheldon with [facility] at #s provided above.  I lmomtcb on both #s.  ** Pt requesting call back today.

## 2012-09-16 NOTE — Telephone Encounter (Signed)
2nd Issue:   Pt would like to know if she is to come in tomorrow for her "bladder inspection".  Pt's understanding is that SN wants her here tomorrow to give a specimen.  Would like to confirm this & that she has an appt for this.  Pt would like to be reached at [phone].   3rd Issue:   Pt stated that a nurse from [facility] has not been out at all this week to check on her.  Antionette Fairy

## 2012-09-16 NOTE — Telephone Encounter (Signed)
Per SN---ua is not needed at this time unless she is having problems.   Ok to order from [facility] home check, nurse visits, etc.   If she is feeling weaker then she may need to go to the ER for assessment and may need rehab in nursing home until she is stronger.  thanks

## 2012-09-16 NOTE — Telephone Encounter (Signed)
LMOMTCB x1 for Ashely at [facility].

## 2012-09-17 ENCOUNTER — Telehealth: Payer: Self-pay | Admitting: Pulmonary Disease

## 2012-09-17 NOTE — Telephone Encounter (Signed)
Pt advised as follows: Randell Loop, CMA 09/16/2012 4:16 PM Signed  Per SN---ua is not needed at this time unless she is having problems. Ok to order from [facility] home check, nurse visits, etc. If she is feeling weaker then she may need to go to the ER for assessment and may need rehab in nursing home until she is stronger. thanks  Nothing further needed. Carron Curie, CMA

## 2012-09-17 NOTE — Telephone Encounter (Signed)
Denna Haggard nurse with [facility] returned call, please CB at [phone]

## 2012-09-17 NOTE — Telephone Encounter (Signed)
lmomtcb x 1 for [employer].

## 2012-09-17 NOTE — Telephone Encounter (Signed)
Diane was advised.Carron Curie, CMA

## 2012-09-18 ENCOUNTER — Telehealth: Payer: Self-pay | Admitting: Pulmonary Disease

## 2012-09-18 NOTE — Telephone Encounter (Signed)
Called and spoke with pt and she wanted to let us know that no nurse came out today from [facility] again.  She is concerned about this and stated that  She feels that they just keep putting her off.  i explained to the pt that they have to get this approved through her insurance company and if they dont come out on Saturday it may be Monday.  Pt stated that she will call back on Monday if she has not heard anything then.

## 2012-09-22 ENCOUNTER — Telehealth: Payer: Self-pay | Admitting: Pulmonary Disease

## 2012-09-22 NOTE — Telephone Encounter (Signed)
Spoke with pt. She states needs help with her pill box. She states was started on new med, and is unsure of the name and wants to make sure it is in her pill box. She states has a caregiver who was there this am, and was supposed to have updated everything, and just wants to be sure that this was done. I advised needs to call the caregiver and ask her this, since she is the person who fixed her pill box. Pt verbalized understanding and states nothing further needed.

## 2012-09-23 ENCOUNTER — Telehealth: Payer: Self-pay | Admitting: Pulmonary Disease

## 2012-09-23 NOTE — Telephone Encounter (Signed)
Spoke with [facility]; states that patient has not been eating much if at all; is drinking her shakes; family figured out that her husband has been eating her portion of meals on wheels; caregiver to get a scale to keep up with her weight. Pt is due for re certification next week as well. This is more of an FYI for SN unless changes need to be made.

## 2012-09-23 NOTE — Telephone Encounter (Signed)
Called and spoke with tiffany from [facility]--- she is aware to cont the supplements and see if the children can be more involved with the meals.  Tiffany stated that they have an aide that comes out Monday through Friday until 1 and the nurses come out from [facility] during the week. [facility] will call for  Any further problems.

## 2012-10-03 DIAGNOSIS — G609 Hereditary and idiopathic neuropathy, unspecified: Secondary | ICD-10-CM | POA: Diagnosis not present

## 2012-10-03 DIAGNOSIS — R269 Unspecified abnormalities of gait and mobility: Secondary | ICD-10-CM | POA: Diagnosis not present

## 2012-10-03 DIAGNOSIS — R609 Edema, unspecified: Secondary | ICD-10-CM | POA: Diagnosis not present

## 2012-10-03 DIAGNOSIS — J449 Chronic obstructive pulmonary disease, unspecified: Secondary | ICD-10-CM | POA: Diagnosis not present

## 2012-10-03 DIAGNOSIS — F411 Generalized anxiety disorder: Secondary | ICD-10-CM | POA: Diagnosis not present

## 2012-10-03 DIAGNOSIS — I10 Essential (primary) hypertension: Secondary | ICD-10-CM | POA: Diagnosis not present

## 2012-10-06 DIAGNOSIS — F411 Generalized anxiety disorder: Secondary | ICD-10-CM | POA: Diagnosis not present

## 2012-10-06 DIAGNOSIS — I10 Essential (primary) hypertension: Secondary | ICD-10-CM | POA: Diagnosis not present

## 2012-10-06 DIAGNOSIS — J449 Chronic obstructive pulmonary disease, unspecified: Secondary | ICD-10-CM | POA: Diagnosis not present

## 2012-10-06 DIAGNOSIS — R609 Edema, unspecified: Secondary | ICD-10-CM | POA: Diagnosis not present

## 2012-10-06 DIAGNOSIS — R269 Unspecified abnormalities of gait and mobility: Secondary | ICD-10-CM | POA: Diagnosis not present

## 2012-10-06 DIAGNOSIS — G609 Hereditary and idiopathic neuropathy, unspecified: Secondary | ICD-10-CM | POA: Diagnosis not present

## 2012-10-07 ENCOUNTER — Other Ambulatory Visit (INDEPENDENT_AMBULATORY_CARE_PROVIDER_SITE_OTHER): Payer: Medicare Other

## 2012-10-07 ENCOUNTER — Encounter: Payer: Self-pay | Admitting: Adult Health

## 2012-10-07 ENCOUNTER — Ambulatory Visit (INDEPENDENT_AMBULATORY_CARE_PROVIDER_SITE_OTHER): Payer: Medicare Other | Admitting: Adult Health

## 2012-10-07 VITALS — BP 90/66 | HR 60 | Temp 97.9°F

## 2012-10-07 DIAGNOSIS — I10 Essential (primary) hypertension: Secondary | ICD-10-CM | POA: Diagnosis not present

## 2012-10-07 DIAGNOSIS — R300 Dysuria: Secondary | ICD-10-CM | POA: Diagnosis not present

## 2012-10-07 LAB — BASIC METABOLIC PANEL
BUN: 27 mg/dL — ABNORMAL HIGH (ref 6–23)
Creatinine, Ser: 1.2 mg/dL (ref 0.4–1.2)
GFR: 53.75 mL/min — ABNORMAL LOW (ref >60.00)

## 2012-10-07 LAB — URINALYSIS, ROUTINE W REFLEX MICROSCOPIC
Bilirubin Urine: NEGATIVE
Hgb urine dipstick: NEGATIVE
Ketones, ur: NEGATIVE
Urine Glucose: NEGATIVE
Urobilinogen, UA: 0.2 (ref 0.0–1.0)

## 2012-10-07 NOTE — Patient Instructions (Addendum)
Stop Norvasc (Amlodipine)  Low salt diet  I will call with labs  Follow up Dr. Kriste Basque  As planned and As needed

## 2012-10-07 NOTE — Assessment & Plan Note (Signed)
Over compensated blood pressure  Will stop norvasc.  montior b/p closely  Check bmet   Also check UA, cx to r/o UTI as cause of weakness.

## 2012-10-07 NOTE — Addendum Note (Signed)
Addended by: Caryl Ada on: 10/07/2012 12:12 PM   Modules accepted: Orders

## 2012-10-07 NOTE — Addendum Note (Signed)
Addended by: Caryl Ada on: 10/07/2012 12:00 PM   Modules accepted: Orders

## 2012-10-07 NOTE — Progress Notes (Signed)
Subjective:    Patient ID: Jessica [name], female    DOB: [birth year], 78 y.o.   MRN: [id number]  HPI 78 y/o BF Jessica [name] has mult medical problems including:  AR & runny nose;  Asthmatic Bronchitis;  HBP;  Chronic VI & edema;  Hyperchol;  GERD & esoph stricture;  Divertics;  Hx Urinary incontinence & UTIs;  DJD/ Osteoporosis;  Semile Dementia;  Anxiety...  ~  December 26, 2010:  61mo ROV & clinically stable but Jessica [name] persists w/ mult somatic complaints:  hips> offered Ortho eval- "it's not that bad", has Mobic for Prn use;  runny nose: chr complaint, ?doesn't remember our numerous prev conversations- rec OTC Zyrtek;  "Jessica [name] needs his nerve pill"> Alpraz 0.25mg  written;  Jessica [name] wants labs done (ate strawberries & milk this AM)> routine labs all wnl, TChol is elev but HDL is 151!  ~  March 27, 2011:  61mo ROV & Jessica [name]'s glad to be here because "things aren't doing to good"> c/o knees weak, knee pain (Mobic helps), noct leg discomfrt (Mobic helps this too), bladder trouble & Jessica [name] wants to see DrTannenbaum again as DrNesi hasn't been able to help her;  Wants to take Fish Oil- OK;  We reviewed her meds & her full labs from 1/12 w/ HDL= 151!!!  ~  May 30, 2011:  59mo ROV & Jessica [name] states "Oh, not doing so well"> c/o allergy & runny nose; feet & legs are burning & stinging (reviewed neuropathy w/ her & offered med but Jessica [name] declines meds at this time); wants more PT & balance training from the same [facility] therapist who comes to the house now for regular PT; wants power chair & I wrote Rx for [facility] to start the process; never went to see DrTannenbaum after last OV & Jessica [name] wants to resched this appt; finally Jessica [name] notes that Q70mo appts aren't satisfactory for her & Jessica [name] wants to be seen every 59months now...  ~  August 01, 2011:  59mo ROV & Jessica [name] reports that Jessica [name]'s enjoyed the therapy, waiting for her power chair, & has appt w/ Urology next week...  Jessica [name] persists w/ mult somatic complaints> swelling in feet (edema is actually down w/ Lasix 20mg -  2Qam & wt down 6# to 120# today); Jessica [name] notes that her nose runs "off & on for >39yr", legs achy & numb, back weak, wonders about allergy shots...  BP looks good; denies CP, palpit, syncope, ch in SOB, or cerebral ischemic symptoms...  Jessica [name] notes some constipation & we discussed Miralax & Senakot-S therapy...  Jessica [name] is really frustrated w/ Jessica [name]'s needs.  ~  October 03, 2011:  59mo ROV & here for face-to-face mobility eval per [facility] for her power wheelchair> forms completed & will be faxed to [facility] & scanned into the EMR... Jessica [name] states "Fair but not too good" today> notes that her legs are "nummy" & we discussed neuropathy & offered Gabapentin Rx but Jessica [name] doesn't want more pills;  C/o continued balance problems for which Jessica [name] had balance training which helped but Jessica [name] wants me to request MORE balance PT;  BP controlled, wt stable, persist LE edema L>R, etc...  Jessica [name] saw DrTannenbaum recently for Urology f/u w/ small hypersens bladder & large bladder diverticulum, not a surg candidate, taught "double voiding" tech; OK Flu vaccine, refills provided per request...  ~  December 18, 2011:  2-51mo ROV & Jessica [name] states "not doing too well" c/o weak (esp in AMs), "nummy" feeling in legs c/w neuropathy (offer Gabapentin Rx & Jessica [name] will let  me know), but notes that knees are better & the therapy is helping;  Wt is down 6# but Jessica [name] says appetite is good & Jessica [name] is eating well & resting well;  BP controlled on meds; On diet alone for Chol w/ VERY hi HDL; GI stable w/ Prilosec & Miralax/ Senakot-S; Jessica [name] saw DrTannenbaum for Urology 10/12- mult complaints, urodynamics done w/ sm hypersens bladder unstable w/ leakage, & large bladder divertic> he rec double voiding technique...  ~  March 25, 2012:  3mo ROV & Jessica [name] is basically stable at 78 y/o; Jessica [name] notes some intermittent swelling in ankles & occas sharp CP laterally (min tender CWP)...  Jessica [name] is currently in rehab at [place] & I doubt Jessica [name] will be able to cqare for him when the rehab stay is up...   Her CC is "just weak", eating OK, wt= 121#, walks w/ cane, no falls, denies SOB & manages ADLs ok...  We reviewed prob list, meds, xrays & labs>  See below>> LABS 3/13:  Chems- ok x Ca=11.1;  CBC- ok w/ Hg=13.2;  TSH=1.63;  BNP=101 We will need to check PTH in follow up...  ~  May 08, 2012:  6wk ROV & Jessica [name] returns w/ increased swelling in ankles and feet- Jessica [name] has known VI & periph edema that has been refractory to management w/ her attempts at low sodium diet, elevation & support hose; Jessica [name] also takes Lasix 20mg  starting at one Qam & increased to Bid; Jessica [name] notes that swelling tends to go down overnight & is worse late in the day; Jessica [name] has lost 3# to 118# since last OV yet Jessica [name] is very concerned & quite perturbed about the edema...  We discussed the need to check CXR & labs; we will change the Lasix to Demadex 20mg - 2 Qam & follow up 1 month... CXR 5/13 showed borderline heart size, kyphoscoliosis, no edema, and clear lungs... LABS 5/13:  Pending ==> Jessica [name] forgot to go to the lab this day.  ~  June 23, 2012:  6wk ROV & Jessica [name]'s edema is diminished on the Demadex20mg - 2 daily but Jessica [name] prefers to split it up 1AM & 1afternoon due to urination; Jessica [name] has mult additional symptoms, somatic complaints, etc> eg- c/o trouble w/ her ears w/ a noise "not a ringing, it' a frying" & noted less when active, distracted w/ background noise etc (we discussed tinnitus & masking the sound w/ "white noise");  Jessica [name] also wants additional Physical therapy & balance retraining from [facility]...   daugh notes Jessica [name] is still in [place] but they may send him home soon...    We reviewed prob list, meds, xrays and labs> see below>> LABS 7/13:  Chems- HCO3=39, BUN=26, Creat=1.2, Ca=11.8, PTH=81... Rec- add DIAMOX 250mg /d, no calc supplements etc...  ~  July 23, 2012:  55mo ROV & Jessica [name] is doing the best Jessica [name] can at home w/ Jessica [name]'s help but they have a hard time w/ her meds & pt's dementia... After last visit Jessica [name] had more "therapy"  but this too has stopped since Jessica [name] really "couldn't do it" per Jessica [name]... Edema has decr but persists in ankles on Demadex20-2/d (?if taking) & Diamox250/d added; note that weight is the same at 116-117# today...     We reviewed prob list, meds, xrays and labs> see below for update >> We decided to simplify her regimen> Norvasc1/2, Lisinopril1/2, Demadex- one daily, Diamox- one daily...  ~  August 25, 2012:  55mo ROV & Jessica [name] states "Sometimes I feel like I'm  doing better" and "today is not one of my worst days"; weight is down 2-3# to 115# today on her Demadex20 & Diamox250; Jessica [name] notes incr stress because Jessica [name] is back home & there is a lot on her- son comes in early AM hours, then a lady helps during the day, daugh in the afternoon, meals on wheels, etc...     VI, Edema> feet are still swelling, on Demadex20, Diamox250, no salt/ elevation/ support hose/ etc; labs showed stable renal w/ Cr=1.1 but K=3.4 & we will start K10Bid...    Hypercalcemia> repeat Ca=11.0 (sl improved) and PTH July2013 was 81; we are folowing & plan repeat PTH in 4-42mo; Jessica [name] knows to avoid calcium supplements & stop VitD as well.    Dementia> Jessica [name] is very slow & deliberate; I continue to feel that Jessica [name] & husb need NHP vs AL etc but they refuse to consider alternative living arrangements... We reviewed prob list, meds, xrays and labs> see below for updates >> LABS 9/13:  Chems- ok w/ K=3.4 (adding K10Bid), BS=90, BUN=17 Cr=1.1, Ca=11.0;  CBC- ok w/ Hg=12.5  08/31/12 Folllow up  Pt comes in family member requests assistance at home  Just weak and off balance at times Worn out with family recently , had a lot of people visiting her recently.  Legs get weak easily.  No fever or chest pain. No falls.  Wants labs today to make sure her K+ is better Seen last week with low K on labs.  rx sent to pharm for kdur.  >>PT referral   10/07/2012 Acute OV  Complains of increased weakness/fatigue, especially after eating, decreased  appetite x5days No urinary symptoms .  Today b/p is 90/60. Over last several office visits trending down ~90-100 systolic  Feels weak and worn out.  No fever , chest pain, palpitations, edema, n/v , bloody stools.        Problem List:  Hx of ALLERGIES (ICD-995.3) - Jessica [name] uses OTC meds as needed, & we prev discussed Zyrtek 10mg Qam, Saline nasal mist, & Atrovent Nasal 0.03% Tid for drainage (one of her chronic complaints)...  Hx of ASTHMATIC BRONCHITIS, ACUTE (ICD-466.0) - no recent exacerbations... ~  CXR 5/13 showed borderline heart size, kyphoscoliosis, no edema, and clear lungs...  HYPERTENSION (ICD-401.9) - controlled on ASA 81mg /d, NORVASC5mg -1/2tab, LISINOPRIL 20mg - 1/2tab & DEMADEX 20mg /d & DIAMOX250mg /d... ~  4/12:  BP= 110/60 and Jessica [name] states that Jessica [name] is taking her meds regularly & tolerating them well... denies HA, visual changes, CP, palpit, change in dyspnea, etc... but notes weakness, intermittently dizzy & persist edema in her feet...  ~  6/12:  BP= 124/70 & Jessica [name] remains stable... ~  8/12:  BP= 110/ 66 & stable... ~  10/12:  BP= 114/62 & stable... ~  1/13:  BP= 110/64 & Jessica [name] remains stable... ~  4/13:  BP= 116/58 & Jessica [name] is reminded to adjust Lasix 1-2 Qam according to her edema... ~  5/13:  BP= 140/72 & Jessica [name] is c/o pedal edema; we decided to change to Demadex 20mg - 2Qam. ~  7/13:  BP= 110/60 & her edema is diminished, still 1+ in feet... Labs showed HCO3=39 & DIAMOX 250mg /d added. ~  8/13:  BP= 118/74 & edema is the same; ?what Jessica [name]'s taking? & we decided to simplify regimen> Norvasc1/2, Lisin1/2, Demadex-1/d & Diamox-1/d... ~  9/13:  BP= 110/70 & Jessica [name] is clinically stable on these meds; add K10Bid for the K=3.4 today...  VENOUS INSUFFICIENCY, CHRONIC (ICD-459.81) - there is mild pedal edema bilat L>R.Marland KitchenMarland Kitchen  on low sodium diet, elevation, support hose- prev refused diuretic meds due to urinary symptoms, now on DEMADEX 20mg   HYPERCHOLESTEROLEMIA (ICD-272.0) - prev on Lip10, but Jessica [name] stopped  this on her own... Jessica [name] has a very high HDL!!! ~  FLP was 1/08 showed TChol 253, TG 125, HDL 121, LDL 93 ~  FLP 2/09 showed TChol 200, TG 99, HDL 136, LDL 44 ~  FLP 4/11 showed TChol 244, TG 143, HDL 149, LDL 59 ~  FLP 1/12 showed TChol 266, TG 102, HDL 151, LDL 83 >> BEST HDL I'VE EVER SEEN  HYPERCALCEMIA >> routine labs w/ Calcium levels betw 10.2 & 11.7 over the last 79yrs... ~  Labs 7/13 showed Ca= 11.8 &  PTH= 81... For now avoid calc supplements etc... ~  Labs 9/13 showed Ca= 11.0 & Jessica [name] is reminded- no calcium, no VitD...  GERD (ICD-530.81) - last EGD was 9/05 by DrPerry showing GERD and esoph stricture- dilated... Jessica [name] stopped her Nexium but states that her swallowing is good & Jessica [name] uses PRILOSEC OTC as needed.  DIVERTICULOSIS OF COLON (ICD-562.10) - last colonoscopy was 11/00 showing divertics only... ~  8/12:  Jessica [name] notes some constipation & rec to take [facility] & SENAKOT-S per our usual protocol...  HX, URINARY INFECTION (ICD-V13.02) & INCONTINENCE symptoms... Jessica [name] has seen DrTannenbaum & staff on bladder exercises and Jessica [name] reported some improvement but has persistant symptoms & never followed up... ~  7/10:  offered f/u appt w/ Urology vs second opinion consult but Jessica [name] declines... ~  9/10:  wrote for trial Enablex 7.5mg /d >> no benefit Jessica [name] says. ~  12/10: saw DrNesi- tried sample med, sl improved, but "he turned me over to a lady for longer term treatments" Jessica [name] says... ~  4/12 & 6/12: states DrNesi hasn't been able to help her & Jessica [name] wants appt w/ DrTannenbaum==> seen w/ urodynamic eval revealing a sm hypersens bladder & a large bladder divertic; not a surg candidate, taught double voiding technique... ~  10/12: Jessica [name] had f/u DrTannenbaum & he reinforced prev w/u & need for double voiding technique...  DEGENERATIVE JOINT DISEASE (ICD-715.90) - notes right shoulder symptoms, but managing OK w/ Mobic 7.5mg  & Tylenol; also right hip & low back pain...  Jessica [name] also c/o neuropathic "burning" in legs but  not bad enough for additional meds Jessica [name] says...  OSTEOPOROSIS (ICD-733.00) - Jessica [name] was on Fosamax but had discontinued this on her own... we discussed the need for continued therapy and Jessica [name] was asked to restart Alendronate but Jessica [name] never did...  Jessica [name] takes Ca++, MVI, VitD==> rec to STOP the Calcium.  SENILE DEMENTIA (ICD-290.0) - Jessica [name] takes ASA 81mg /d... Jessica [name] tried Aricept in the past but didn't notice any improvement therefore stopped it. ~  Jessica [name] & her husb still live on their own but are having numerous difficulties while still resisting any thoughts of assisted living etc> got concerned because Jessica [name] saw an impulse in her left antecubital fossa (tortuous brachial art), went to the ER & this got translated to palpitations & lead to full ER cardiac eval= neg... ~  6/11: MMSE showed 26/30 & Jessica [name] agreed to try DONEPEZIL 10mg /d... ~  6/12:  Still taking Aricept & appears stable... ~  7/13:  No change, same meds, clinically stable...  ANXIETY (ICD-300.00) - Jessica [name] uses Alpraz 0.25mg  as needed.  Hx of SHINGLES (ICD-053.9)   Past Surgical History  Procedure Date  . Vesicovaginal fistula closure w/ tah   . Tonsillectomy   . Abdominal hysterectomy  Outpatient Encounter Prescriptions as of 10/07/2012  Medication Sig Dispense Refill  . acetaZOLAMIDE (DIAMOX) 250 MG tablet Take 250 mg by mouth daily. At 4 pm      . ALPRAZolam (XANAX) 0.25 MG tablet 1/2 -1 tablet by mouth three times a day as needed for nerves  90 tablet  5  . amLODipine (NORVASC) 5 MG tablet Take 1/2 tablet by mouth daily      . aspirin 81 MG tablet Take 81 mg by mouth daily.        Marland Kitchen donepezil (ARICEPT) 10 MG tablet Take 1 tablet (10 mg total) by mouth at bedtime as needed.  90 tablet  3  . lisinopril (PRINIVIL,ZESTRIL) 20 MG tablet Take 1/2 tablet by mouth daily      . meloxicam (MOBIC) 7.5 MG tablet 1 by mouth once daily w/ food as needed for joint/back pain       . Multiple Vitamin (MULTIVITAMIN) tablet Take 1 tablet by mouth daily.         . potassium chloride (K-DUR) 10 MEQ tablet 2 tabs in the morning and 1 at night.  90 tablet  6  . torsemide (DEMADEX) 20 MG tablet Take 1 tablet by mouth every morning      . DISCONTD: acetaZOLAMIDE (DIAMOX) 250 MG tablet Take 1 tablet (250 mg total) by mouth daily. At 4 pm  30 tablet  6    No Known Allergies   Current Medications, Allergies, Past Medical History, Past Surgical History, Family History, and Social History were reviewed in [employer] record.    Review of Systems        Constitutional:   No  weight loss, night sweats,  Fevers, chills,  +fatigue, or  lassitude.  HEENT:   No headaches,  Difficulty swallowing,  Tooth/dental problems, or  Sore throat,                No sneezing, itching, ear ache, nasal congestion, post nasal drip,   CV:  No chest pain,  Orthopnea, PND, swelling in lower extremities, anasarca, dizziness, palpitations, syncope.   GI  No heartburn, indigestion, abdominal pain, nausea, vomiting, diarrhea, change in bowel habits,   Resp: No shortness of breath with exertion or at rest.  No excess mucus, no productive cough,  No non-productive cough,  No coughing up of blood.  No change in color of mucus.  No wheezing.  No chest wall deformity  Skin: no rash or lesions.  GU: no dysuria, change in color of urine, no urgency or frequency.  No flank pain, no hematuria   MS:     No decreased range of motion.     Psych:  No change in mood or affect. No depression or anxiety.  No memory loss.       Objective:   Physical Exam      WD, WN, 78 y/o BF in NAD... GENERAL:  Alert, pleasant & cooperative; Jessica [name] is slow moving as noted... HEENT:  [place]/AT, EOM-full,  TMs-wnl, NOSE-clear, THROAT-clear & wnl. NECK:  Supple w/ fairROM; no JVD; normal carotid impulses w/o bruits; no thyromegaly or nodules palpated; no lymphadenopathy. CHEST:  Clear to P & A; without wheezes/ rales/ or rhonchi., noted kyphosis... HEART:  Regular Rhythm; without  murmurs/ rubs/ or gallops heard... ABDOMEN:  Soft & nontender; normal bowel sounds; no organomegaly or masses detected. EXT:  mod arthritic changes; no varicose veins/ +venous insuffic/ 1+edema... NEURO:   Alert,  no focal neuro deficits... DERM:  No lesions noted; no rash etc...    Assessment & Plan:

## 2012-10-08 DIAGNOSIS — I10 Essential (primary) hypertension: Secondary | ICD-10-CM | POA: Diagnosis not present

## 2012-10-08 DIAGNOSIS — G609 Hereditary and idiopathic neuropathy, unspecified: Secondary | ICD-10-CM | POA: Diagnosis not present

## 2012-10-08 DIAGNOSIS — R269 Unspecified abnormalities of gait and mobility: Secondary | ICD-10-CM | POA: Diagnosis not present

## 2012-10-08 DIAGNOSIS — F411 Generalized anxiety disorder: Secondary | ICD-10-CM | POA: Diagnosis not present

## 2012-10-08 DIAGNOSIS — R609 Edema, unspecified: Secondary | ICD-10-CM | POA: Diagnosis not present

## 2012-10-08 DIAGNOSIS — J449 Chronic obstructive pulmonary disease, unspecified: Secondary | ICD-10-CM | POA: Diagnosis not present

## 2012-10-09 ENCOUNTER — Other Ambulatory Visit: Payer: Self-pay | Admitting: Adult Health

## 2012-10-09 LAB — CULTURE, URINE COMPREHENSIVE: Colony Count: 80000

## 2012-10-09 MED ORDER — FLUCONAZOLE 100 MG PO TABS: 100.0000 mg | ORAL_TABLET | Freq: Every day | ORAL | Status: DC

## 2012-10-09 NOTE — Progress Notes (Signed)
Quick Note:  Spoke with pt and notified of results per Jeanmarie Plant, NP. Pt verbalized understanding and denied any questions. Not taking any calcium per pt  ______

## 2012-10-09 NOTE — Progress Notes (Signed)
Quick Note:  Spoke with pt and notified of results per Rubye Oaks, NP. Pt verbalized understanding and denied any questions. Rx for diflucan was sent to pharm. ______

## 2012-10-10 ENCOUNTER — Emergency Department (HOSPITAL_COMMUNITY): Payer: Medicare Other

## 2012-10-10 ENCOUNTER — Encounter (HOSPITAL_COMMUNITY): Payer: Self-pay | Admitting: Emergency Medicine

## 2012-10-10 ENCOUNTER — Inpatient Hospital Stay (HOSPITAL_COMMUNITY)
Admission: EM | Admit: 2012-10-10 | Discharge: 2012-10-15 | DRG: 757 | Disposition: A | Discharge status: Skilled Nursing Facility | Payer: Medicare Other | Attending: Family Medicine | Admitting: Family Medicine

## 2012-10-10 DIAGNOSIS — R404 Transient alteration of awareness: Secondary | ICD-10-CM | POA: Diagnosis not present

## 2012-10-10 DIAGNOSIS — G589 Mononeuropathy, unspecified: Secondary | ICD-10-CM | POA: Diagnosis present

## 2012-10-10 DIAGNOSIS — F411 Generalized anxiety disorder: Secondary | ICD-10-CM

## 2012-10-10 DIAGNOSIS — J069 Acute upper respiratory infection, unspecified: Secondary | ICD-10-CM

## 2012-10-10 DIAGNOSIS — T7840XA Allergy, unspecified, initial encounter: Secondary | ICD-10-CM

## 2012-10-10 DIAGNOSIS — R5383 Other fatigue: Secondary | ICD-10-CM | POA: Diagnosis present

## 2012-10-10 DIAGNOSIS — IMO0002 Reserved for concepts with insufficient information to code with codable children: Secondary | ICD-10-CM

## 2012-10-10 DIAGNOSIS — I4510 Unspecified right bundle-branch block: Secondary | ICD-10-CM | POA: Diagnosis present

## 2012-10-10 DIAGNOSIS — F0390 Unspecified dementia without behavioral disturbance: Secondary | ICD-10-CM | POA: Diagnosis present

## 2012-10-10 DIAGNOSIS — K5900 Constipation, unspecified: Secondary | ICD-10-CM | POA: Diagnosis present

## 2012-10-10 DIAGNOSIS — I498 Other specified cardiac arrhythmias: Secondary | ICD-10-CM | POA: Diagnosis present

## 2012-10-10 DIAGNOSIS — N390 Urinary tract infection, site not specified: Secondary | ICD-10-CM | POA: Diagnosis present

## 2012-10-10 DIAGNOSIS — K222 Esophageal obstruction: Secondary | ICD-10-CM | POA: Diagnosis present

## 2012-10-10 DIAGNOSIS — M810 Age-related osteoporosis without current pathological fracture: Secondary | ICD-10-CM

## 2012-10-10 DIAGNOSIS — K219 Gastro-esophageal reflux disease without esophagitis: Secondary | ICD-10-CM | POA: Diagnosis present

## 2012-10-10 DIAGNOSIS — R079 Chest pain, unspecified: Secondary | ICD-10-CM | POA: Diagnosis not present

## 2012-10-10 DIAGNOSIS — R5381 Other malaise: Secondary | ICD-10-CM | POA: Diagnosis not present

## 2012-10-10 DIAGNOSIS — E860 Dehydration: Secondary | ICD-10-CM | POA: Diagnosis not present

## 2012-10-10 DIAGNOSIS — R001 Bradycardia, unspecified: Secondary | ICD-10-CM

## 2012-10-10 DIAGNOSIS — E43 Unspecified severe protein-calorie malnutrition: Secondary | ICD-10-CM | POA: Diagnosis present

## 2012-10-10 DIAGNOSIS — E7800 Pure hypercholesterolemia, unspecified: Secondary | ICD-10-CM

## 2012-10-10 DIAGNOSIS — R531 Weakness: Secondary | ICD-10-CM

## 2012-10-10 DIAGNOSIS — R634 Abnormal weight loss: Secondary | ICD-10-CM

## 2012-10-10 DIAGNOSIS — B3749 Other urogenital candidiasis: Secondary | ICD-10-CM | POA: Diagnosis not present

## 2012-10-10 DIAGNOSIS — I872 Venous insufficiency (chronic) (peripheral): Secondary | ICD-10-CM | POA: Diagnosis present

## 2012-10-10 DIAGNOSIS — E8889 Other specified metabolic disorders: Secondary | ICD-10-CM | POA: Diagnosis present

## 2012-10-10 DIAGNOSIS — R32 Unspecified urinary incontinence: Secondary | ICD-10-CM

## 2012-10-10 DIAGNOSIS — R609 Edema, unspecified: Secondary | ICD-10-CM

## 2012-10-10 DIAGNOSIS — Z66 Do not resuscitate: Secondary | ICD-10-CM | POA: Diagnosis present

## 2012-10-10 DIAGNOSIS — R002 Palpitations: Secondary | ICD-10-CM

## 2012-10-10 DIAGNOSIS — I10 Essential (primary) hypertension: Secondary | ICD-10-CM | POA: Diagnosis not present

## 2012-10-10 DIAGNOSIS — M1990 Unspecified osteoarthritis, unspecified site: Secondary | ICD-10-CM

## 2012-10-10 DIAGNOSIS — Z79899 Other long term (current) drug therapy: Secondary | ICD-10-CM

## 2012-10-10 DIAGNOSIS — N323 Diverticulum of bladder: Secondary | ICD-10-CM | POA: Diagnosis present

## 2012-10-10 DIAGNOSIS — J209 Acute bronchitis, unspecified: Secondary | ICD-10-CM

## 2012-10-10 DIAGNOSIS — D649 Anemia, unspecified: Secondary | ICD-10-CM | POA: Diagnosis present

## 2012-10-10 DIAGNOSIS — K5730 Diverticulosis of large intestine without perforation or abscess without bleeding: Secondary | ICD-10-CM

## 2012-10-10 DIAGNOSIS — R42 Dizziness and giddiness: Secondary | ICD-10-CM | POA: Diagnosis not present

## 2012-10-10 LAB — FERRITIN: Ferritin: 175 ng/mL (ref 10–291)

## 2012-10-10 LAB — URINE MICROSCOPIC-ADD ON

## 2012-10-10 LAB — CBC
HCT: 36.2 % (ref 36.0–46.0)
HCT: 35.9 % — ABNORMAL LOW (ref 36.0–46.0)
Hemoglobin: 11.5 g/dL — ABNORMAL LOW (ref 12.0–15.0)
Hemoglobin: 11.8 g/dL — ABNORMAL LOW (ref 12.0–15.0)
MCHC: 31.8 g/dL (ref 30.0–36.0)
RBC: 4.19 MIL/uL (ref 3.87–5.11)
RBC: 4.13 MIL/uL (ref 3.87–5.11)
WBC: 6 10*3/uL (ref 4.0–10.5)

## 2012-10-10 LAB — COMPREHENSIVE METABOLIC PANEL
ALT: 14 U/L (ref 0–35)
AST: 21 U/L (ref 0–37)
Alkaline Phosphatase: 47 U/L (ref 39–117)
CO2: 29 mEq/L (ref 19–32)
Chloride: 106 mEq/L (ref 96–112)
GFR calc Af Amer: 42 mL/min — ABNORMAL LOW (ref >90)
GFR calc non Af Amer: 36 mL/min — ABNORMAL LOW (ref >90)
Glucose, Bld: 95 mg/dL (ref 70–99)
Potassium: 3.8 mEq/L (ref 3.5–5.1)
Sodium: 139 mEq/L (ref 135–145)

## 2012-10-10 LAB — URINALYSIS, ROUTINE W REFLEX MICROSCOPIC
Glucose, UA: NEGATIVE mg/dL
Hgb urine dipstick: NEGATIVE
Specific Gravity, Urine: 1.012 (ref 1.005–1.030)
pH: 8 (ref 5.0–8.0)

## 2012-10-10 LAB — RETICULOCYTES
RBC.: 4.13 MIL/uL (ref 3.87–5.11)
Retic Count, Absolute: 33 10*3/uL (ref 19.0–186.0)
Retic Ct Pct: 0.8 % (ref 0.4–3.1)

## 2012-10-10 LAB — CREATININE, SERUM
Creatinine, Ser: 1.18 mg/dL — ABNORMAL HIGH (ref 0.50–1.10)
GFR calc Af Amer: 43 mL/min — ABNORMAL LOW (ref >90)
GFR calc non Af Amer: 37 mL/min — ABNORMAL LOW (ref >90)

## 2012-10-10 LAB — TROPONIN I
Troponin I: <0.3 ng/mL (ref <0.30)
Troponin I: <0.3 ng/mL (ref <0.30)

## 2012-10-10 LAB — IRON AND TIBC
Saturation Ratios: 38 % (ref 20–55)
UIBC: 121 ug/dL — ABNORMAL LOW (ref 125–400)

## 2012-10-10 LAB — MAGNESIUM: Magnesium: 2.1 mg/dL (ref 1.5–2.5)

## 2012-10-10 LAB — TSH: TSH: 1.144 u[IU]/mL (ref 0.350–4.500)

## 2012-10-10 MED ORDER — LISINOPRIL 10 MG PO TABS
10.0000 mg | ORAL_TABLET | Freq: Every day | ORAL | Status: DC
  Administered 2012-10-10: 10 mg via ORAL
  Filled 2012-10-10 (×2): qty 1

## 2012-10-10 MED ORDER — POLYETHYLENE GLYCOL 3350 17 G PO PACK
17.0000 g | PACK | Freq: Every day | ORAL | Status: DC
  Administered 2012-10-10 – 2012-10-12 (×2): 17 g via ORAL
  Filled 2012-10-10 (×6): qty 1

## 2012-10-10 MED ORDER — SODIUM CHLORIDE 0.9 % IV SOLN: 250.0000 mL | INTRAVENOUS | Status: DC | PRN

## 2012-10-10 MED ORDER — ONDANSETRON HCL 4 MG PO TABS: 4.0000 mg | ORAL_TABLET | Freq: Four times a day (QID) | ORAL | Status: DC | PRN

## 2012-10-10 MED ORDER — SODIUM CHLORIDE 0.9 % IV BOLUS (SEPSIS)
1000.0000 mL | Freq: Once | INTRAVENOUS | Status: AC
  Administered 2012-10-10: 1000 mL via INTRAVENOUS

## 2012-10-10 MED ORDER — FLUCONAZOLE 100 MG PO TABS
100.0000 mg | ORAL_TABLET | Freq: Every day | ORAL | Status: DC
  Administered 2012-10-10 – 2012-10-12 (×3): 100 mg via ORAL
  Filled 2012-10-10 (×4): qty 1

## 2012-10-10 MED ORDER — DEXTROSE 5 % IV SOLN
1.0000 g | INTRAVENOUS | Status: DC
  Filled 2012-10-10: qty 10

## 2012-10-10 MED ORDER — SODIUM CHLORIDE 0.9 % IJ SOLN
3.0000 mL | Freq: Two times a day (BID) | INTRAMUSCULAR | Status: DC
  Administered 2012-10-10 – 2012-10-15 (×5): 3 mL via INTRAVENOUS

## 2012-10-10 MED ORDER — ASPIRIN EC 81 MG PO TBEC
81.0000 mg | DELAYED_RELEASE_TABLET | Freq: Every day | ORAL | Status: DC
  Administered 2012-10-10 – 2012-10-15 (×6): 81 mg via ORAL
  Filled 2012-10-10 (×6): qty 1

## 2012-10-10 MED ORDER — ONE-DAILY MULTI VITAMINS PO TABS: 1.0000 | ORAL_TABLET | Freq: Every day | ORAL | Status: DC

## 2012-10-10 MED ORDER — SODIUM CHLORIDE 0.9 % IV SOLN
INTRAVENOUS | Status: DC
  Administered 2012-10-10: 20:00:00 via INTRAVENOUS
  Administered 2012-10-11: 1000 mL via INTRAVENOUS

## 2012-10-10 MED ORDER — ACETAMINOPHEN 650 MG RE SUPP: 650.0000 mg | Freq: Four times a day (QID) | RECTAL | Status: DC | PRN

## 2012-10-10 MED ORDER — HEPARIN SODIUM (PORCINE) 5000 UNIT/ML IJ SOLN
5000.0000 [IU] | Freq: Three times a day (TID) | INTRAMUSCULAR | Status: DC
  Administered 2012-10-10 – 2012-10-15 (×14): 5000 [IU] via SUBCUTANEOUS
  Filled 2012-10-10 (×17): qty 1

## 2012-10-10 MED ORDER — ALPRAZOLAM 0.25 MG PO TABS: 0.1250 mg | ORAL_TABLET | Freq: Three times a day (TID) | ORAL | Status: DC | PRN

## 2012-10-10 MED ORDER — ACETAZOLAMIDE 250 MG PO TABS
250.0000 mg | ORAL_TABLET | Freq: Every day | ORAL | Status: DC
  Administered 2012-10-10 – 2012-10-11 (×2): 250 mg via ORAL
  Filled 2012-10-10 (×2): qty 1

## 2012-10-10 MED ORDER — DEXTROSE 5 % IV SOLN
1.0000 g | Freq: Once | INTRAVENOUS | Status: AC
  Administered 2012-10-10: 12:00:00 via INTRAVENOUS
  Filled 2012-10-10: qty 10

## 2012-10-10 MED ORDER — MORPHINE SULFATE 2 MG/ML IJ SOLN: 1.0000 mg | INTRAMUSCULAR | Status: DC | PRN

## 2012-10-10 MED ORDER — DEXTROSE 5 % IV SOLN
1.0000 g | INTRAVENOUS | Status: AC
  Administered 2012-10-11 – 2012-10-12 (×2): 1 g via INTRAVENOUS
  Filled 2012-10-10 (×2): qty 10

## 2012-10-10 MED ORDER — ONDANSETRON HCL 4 MG/2ML IJ SOLN: 4.0000 mg | Freq: Four times a day (QID) | INTRAMUSCULAR | Status: DC | PRN

## 2012-10-10 MED ORDER — ACETAMINOPHEN 325 MG PO TABS: 650.0000 mg | ORAL_TABLET | Freq: Four times a day (QID) | ORAL | Status: DC | PRN

## 2012-10-10 MED ORDER — SODIUM CHLORIDE 0.9 % IJ SOLN: 3.0000 mL | INTRAMUSCULAR | Status: DC | PRN

## 2012-10-10 MED ORDER — ADULT MULTIVITAMIN W/MINERALS CH
1.0000 | ORAL_TABLET | Freq: Every day | ORAL | Status: DC
  Administered 2012-10-11 – 2012-10-15 (×5): 1 via ORAL
  Filled 2012-10-10 (×6): qty 1

## 2012-10-10 MED ORDER — SODIUM CHLORIDE 0.9 % IV BOLUS (SEPSIS)
500.0000 mL | Freq: Once | INTRAVENOUS | Status: AC
  Administered 2012-10-10: 500 mL via INTRAVENOUS

## 2012-10-10 MED ORDER — CEPHALEXIN 250 MG PO CAPS: 500.0000 mg | ORAL_CAPSULE | Freq: Once | ORAL | Status: DC

## 2012-10-10 MED ORDER — OXYCODONE HCL 5 MG PO TABS: 5.0000 mg | ORAL_TABLET | ORAL | Status: DC | PRN

## 2012-10-10 NOTE — ED Provider Notes (Signed)
History     CSN: [id number]  Arrival date & time 10/10/12  0904   First MD Initiated Contact with Patient 10/10/12 [phone]      Chief Complaint  Patient presents with  . Weakness    (Consider location/radiation/quality/duration/timing/severity/associated sxs/prior treatment) HPI The patient presents with concerns of fatigue.  She describes decreasing energy over the past weeks to months.  However, over the past day this has become more pronounced.  She now notes that she is incapable of performing previously typical activities of daily living, such as walking around her house.  She denies focal pain, lightheadedness, syncope, chest pain, dyspnea, fever, other focal complaints. She denies any ongoing vomiting, diarrhea, melena, black stool, dysuria.  I discussed the patient's arrival with EMS.  According to the EMS providers her heart rate on their arrival was in the 40s. Past Medical History  Diagnosis Date  . Allergy, unspecified not elsewhere classified   . Acute asthmatic bronchitis   . Hypertension   . Palpitations   . Chronic venous insufficiency   . Hypercholesteremia   . GERD (gastroesophageal reflux disease)   . Diverticulosis of colon   . History of urinary tract infection   . Urinary frequency   . Urinary incontinence   . DJD (degenerative joint disease)   . Osteoporosis   . Senile dementia   . Neuropathy   . Anxiety   . History of shingles     Past Surgical History  Procedure Date  . Vesicovaginal fistula closure w/ tah   . Tonsillectomy   . Abdominal hysterectomy     Family History  Problem Relation Age of Onset  . Transient ischemic attack Father   . Pneumonia Sister     History  Substance Use Topics  . Smoking status: Never Smoker   . Smokeless tobacco: Never Used  . Alcohol Use: No    OB History    Grav Para Term Preterm Abortions TAB SAB Ect Mult Living                  Review of Systems  Constitutional:       Per HPI, otherwise  negative  HENT:       Per HPI, otherwise negative  Eyes: Negative.   Respiratory:       Per HPI, otherwise negative  Cardiovascular:       Per HPI, otherwise negative  Gastrointestinal: Negative for vomiting.  Genitourinary: Negative.   Musculoskeletal:       Per HPI, otherwise negative  Skin: Negative.   Neurological: Positive for weakness. Negative for syncope.    Allergies  Review of patient's allergies indicates no known allergies.  Home Medications   Current Outpatient Rx  Name Route Sig Dispense Refill  . ACETAZOLAMIDE 250 MG PO TABS Oral Take 250 mg by mouth daily. At 4 pm    . ALPRAZOLAM 0.25 MG PO TABS Oral Take 0.125-0.25 mg by mouth 3 (three) times daily as needed. For nerves    . AMLODIPINE BESYLATE 5 MG PO TABS Oral Take 2.5 mg by mouth every morning.    . ASPIRIN EC 81 MG PO TBEC Oral Take 81 mg by mouth daily.    Marland Kitchen LISINOPRIL 20 MG PO TABS Oral Take 10 mg by mouth daily.     Marland Kitchen ONE-DAILY MULTI VITAMINS PO TABS Oral Take 1 tablet by mouth daily.      Marland Kitchen POTASSIUM CHLORIDE ER 10 MEQ PO TBCR Oral Take 10-20 mEq by mouth 2 (two)  times daily. in the morning and at night.    . TORSEMIDE 20 MG PO TABS  Take 1 tablet by mouth every morning      BP 140/103  Pulse 48  Temp 98.2 F (36.8 C) (Oral)  Resp 21  Ht 5' (1.524 m)  Wt 103 lb (46.72 kg)  BMI 20.12 kg/m2  SpO2 100%  Physical Exam  Nursing note and vitals reviewed. Constitutional: She is oriented to person, place, and time. She appears well-developed and well-nourished. No distress.  HENT:  Head: Normocephalic and atraumatic.  Eyes: Conjunctivae normal and EOM are normal.  Cardiovascular: Regular rhythm.  Bradycardia present.   Murmur heard. Pulmonary/Chest: Effort normal and breath sounds normal. No stridor. No respiratory distress.  Abdominal: She exhibits no distension.  Musculoskeletal: She exhibits no edema.  Neurological: She is alert and oriented to person, place, and time. No cranial  nerve deficit.  Skin: Skin is warm and dry.  Psychiatric: She has a normal mood and affect.    ED Course  Procedures (including critical care time)  Labs Reviewed  CBC - Abnormal; Notable for the following:    Hemoglobin 11.5 (*)     All other components within normal limits  URINALYSIS, ROUTINE W REFLEX MICROSCOPIC - Abnormal; Notable for the following:    APPearance TURBID (*)     Leukocytes, UA MODERATE (*)     All other components within normal limits  COMPREHENSIVE METABOLIC PANEL - Abnormal; Notable for the following:    BUN 24 (*)     Creatinine, Ser 1.19 (*)     Calcium 11.9 (*)     Albumin 3.3 (*)     GFR calc non Af Amer 36 (*)     GFR calc Af Amer 42 (*)     All other components within normal limits  TROPONIN I  MAGNESIUM  LACTIC ACID, PLASMA  URINE MICROSCOPIC-ADD ON  URINE CULTURE   Dg Chest 2 View  10/10/2012  *RADIOLOGY REPORT*  Clinical Data: Chest discomfort  CHEST - 2 VIEW  Comparison: 05/08/2012  Findings: Thoracic kyphoscoliosis.  Diffuse osteopenia.  Lungs are clear, mildly hyperinflated.  Heart size within normal limits.  No effusion.  IMPRESSION:  No acute disease   Original Report Authenticated By: Osa Craver, M.D.      No diagnosis found.  Cardiac: 41sb, abnormal  O2: 99%ra, normal   Date: 10/10/2012 #1  Rate: 49  Rhythm: sinus bradycardia  QRS Axis: normal  Intervals: normal  ST/T Wave abnormalities: ST elevations anteriorly  Conduction Disutrbances:nonspecific intraventricular conduction delay  Narrative Interpretation:   Old EKG Reviewed: unchanged and changes noted Anterior changes and significant artefact    Date: 10/10/2012 #2  Rate: 63  Rhythm: normal sinus rhythm  QRS Axis: normal  Intervals: PR prolonged  ST/T Wave abnormalities: nonspecific ST changes  Conduction Disutrbances:nonspecific intraventricular conduction delay  Narrative Interpretation:   Old EKG Reviewed: changes noted Prior anterior st elevation  in anterior leads is now concave    12:14 PM Patient informed of all results.  She remains in no distress, though her heart rate is in the low 40s.  MDM  This elderly female now presents with concerns of fatigue, progressive over the past day.  On my exam the patient is awake, alert, appropriately interactive.  However, the patient's heart rate is intermittently in the low 40s.  Patient's ECG is no different from her most recent studies, with ST elevations in anteriorly.  The patient's  labs are largely reassuring, though there is evidence of a urinary tract infection.  Given the patient's UTI, her cardia, her fatigue she will be admitted for further evaluation and management.  Gerhard Munch, MD 10/10/12 1251

## 2012-10-10 NOTE — ED Notes (Signed)
Pt arrives via EMS with increasing weakness over 1 month.  Worsening weakness today.  EMS found HR to be in the 40's.

## 2012-10-10 NOTE — ED Notes (Signed)
MD at bedside. 

## 2012-10-10 NOTE — H&P (Addendum)
PCP:   [name], MD   Chief Complaint:  Weakness.   HPI: This is a 78 year old female, with history of HTN, dyslipidemia, GERD/esophageal stricture, DJD, osteoporosis, urinary incontinence/bladder diverticulum, recurrent UTIs, neuropathy, anxiety, previous shingles, diverticulosis, dementia, chronic venous insufficiency, s/p vesicovaginal fistula closure/TAH, presenting with above symptoms. According to patient, she has been progressively weak for the past few months, but this has become worse in the last 2 weeks. Appetite is poor, but she has had no fever, chill, abdominal pain, vomiting or diarrhea. She has had no cough or shortness of breath. Patient was seen at her PMD's office on 10/07/12, by PA, and Norvasc was discontinued, for "over-compensated blood pressure". U/A and culture were also done at that time. Per ED MD,  She was found to be bradycardic in the ED, with HR occasionally down to the 40s, although she has no complaint of dizziness, chest pain or SOB.    Allergies:  No Known Allergies    Past Medical History  Diagnosis Date  . Allergy, unspecified not elsewhere classified   . Acute asthmatic bronchitis   . Hypertension   . Palpitations   . Chronic venous insufficiency   . Hypercholesteremia   . GERD (gastroesophageal reflux disease)   . Diverticulosis of colon   . History of urinary tract infection   . Urinary frequency   . Urinary incontinence   . DJD (degenerative joint disease)   . Osteoporosis   . Senile dementia   . Neuropathy   . Anxiety   . History of shingles     Past Surgical History  Procedure Date  . Vesicovaginal fistula closure w/ tah   . Tonsillectomy   . Abdominal hysterectomy     Prior to Admission medications   Medication Sig Start Date End Date Taking? Authorizing Provider  acetaZOLAMIDE (DIAMOX) 250 MG tablet Take 250 mg by mouth daily. At 4 pm 06/25/12 10/07/13 Yes [name], MD  ALPRAZolam Prudy Feeler) 0.25 MG tablet Take 0.125-0.25  mg by mouth 3 (three) times daily as needed. For nerves   Yes Historical Provider, MD  amLODipine (NORVASC) 5 MG tablet Take 2.5 mg by mouth every morning.   Yes Historical Provider, MD  aspirin EC 81 MG tablet Take 81 mg by mouth daily.   Yes Historical Provider, MD  lisinopril (PRINIVIL,ZESTRIL) 20 MG tablet Take 10 mg by mouth daily.  10/03/11 10/07/13 Yes [name], MD  Multiple Vitamin (MULTIVITAMIN) tablet Take 1 tablet by mouth daily.     Yes Historical Provider, MD  potassium chloride (K-DUR) 10 MEQ tablet Take 10-20 mEq by mouth 2 (two) times daily. in the morning and at night. 09/14/12  Yes [name], MD  torsemide [facility]) 20 MG tablet Take 1 tablet by mouth every morning 06/05/12  Yes [name], MD    Social History: Retired Runner, broadcasting/film/video, has offspring. She reports that she has never smoked. She has never used smokeless tobacco. She reports that she does not drink alcohol or use illicit drugs.  Family History  Problem Relation Age of Onset  . Transient ischemic attack Father   . Pneumonia Sister     Review of Systems:  As per HPI and chief complaint. Patent has fatigue, diminished appetite, denies weight loss, fever, chills, headache, blurred vision, difficulty in speaking, dysphagia, chest pain, cough, shortness of breath, orthopnea, paroxysmal nocturnal dyspnea, nausea, diaphoresis, abdominal pain, vomiting, diarrhea, belching, heartburn, hematemesis, melena, dysuria. She does get nocturia, urinary frequency,  which she attributes to diuretics, somewhat constipated last bowel movement was 3 days ago, but denies hematochezia, lower extremity swelling, pain, or redness. The rest of the systems review is negative.  Physical Exam:  General:  Patient does not appear to be in obvious acute distress. Alert, communicative, fully oriented, talking in complete sentences, not short of breath at rest.  HEENT:  Mild clinical pallor, no jaundice, no conjunctival injection or  discharge. Visible buccal mucosa is somewhat "dry".  NECK:  Supple, JVP not seen, no carotid bruits, no palpable lymphadenopathy, no palpable goiter. CHEST:  Clinically clear to auscultation, no wheezes, no crackles. HEART:  Sounds 1 and 2 heard, normal, regular, bradycardic, no murmurs. ABDOMEN:  Full, soft, non-tender, no palpable organomegaly, no palpable masses, normal bowel sounds. GENITALIA:  Not examined. LOWER EXTREMITIES:  No pitting edema, palpable peripheral pulses. MUSCULOSKELETAL SYSTEM:  Generalized osteoarthritic changes, otherwise, normal. CENTRAL NERVOUS SYSTEM:  No focal neurologic deficit on gross examination.  Labs on Admission:  Results for orders placed during the hospital encounter of 10/10/12 (from the past 48 hour(s))  CBC     Status: Abnormal   Collection Time   10/10/12  9:49 AM      Component Value Range Comment   WBC 6.0  4.0 - 10.5 K/uL    RBC 4.19  3.87 - 5.11 MIL/uL    Hemoglobin 11.5 (*) 12.0 - 15.0 g/dL    HCT 04.5  40.9 - 81.1 %    MCV 86.4  78.0 - 100.0 fL    MCH 27.4  26.0 - 34.0 pg    MCHC 31.8  30.0 - 36.0 g/dL    RDW 91.4  78.2 - 95.6 %    Platelets 248  150 - 400 K/uL   COMPREHENSIVE METABOLIC PANEL     Status: Abnormal   Collection Time   10/10/12  9:49 AM      Component Value Range Comment   Sodium 139  135 - 145 mEq/L    Potassium 3.8  3.5 - 5.1 mEq/L    Chloride 106  96 - 112 mEq/L    CO2 29  19 - 32 mEq/L    Glucose, Bld 95  70 - 99 mg/dL    BUN 24 (*) 6 - 23 mg/dL    Creatinine, Ser 2.13 (*) 0.50 - 1.10 mg/dL    Calcium 08.6 (*) 8.4 - 10.5 mg/dL    Total Protein 6.2  6.0 - 8.3 g/dL    Albumin 3.3 (*) 3.5 - 5.2 g/dL    AST 21  0 - 37 U/L    ALT 14  0 - 35 U/L    Alkaline Phosphatase 47  39 - 117 U/L    Total Bilirubin 0.3  0.3 - 1.2 mg/dL    GFR calc non Af Amer 36 (*) >90 mL/min    GFR calc Af Amer 42 (*) >90 mL/min   MAGNESIUM     Status: Normal   Collection Time   10/10/12  9:49 AM      Component Value Range Comment    Magnesium 2.2  1.5 - 2.5 mg/dL   TROPONIN I     Status: Normal   Collection Time   10/10/12  9:51 AM      Component Value Range Comment   Troponin I <0.30  <0.30 ng/mL   LACTIC ACID, PLASMA     Status: Normal   Collection Time   10/10/12  9:52 AM      Component  Value Range Comment   Lactic Acid, Venous 0.9  0.5 - 2.2 mmol/L   URINALYSIS, ROUTINE W REFLEX MICROSCOPIC     Status: Abnormal   Collection Time   10/10/12 11:31 AM      Component Value Range Comment   Color, Urine YELLOW  YELLOW    APPearance TURBID (*) CLEAR    Specific Gravity, Urine 1.012  1.005 - 1.030    pH 8.0  5.0 - 8.0    Glucose, UA NEGATIVE  NEGATIVE mg/dL    Hgb urine dipstick NEGATIVE  NEGATIVE    Bilirubin Urine NEGATIVE  NEGATIVE    Ketones, ur NEGATIVE  NEGATIVE mg/dL    Protein, ur NEGATIVE  NEGATIVE mg/dL    Urobilinogen, UA 0.2  0.0 - 1.0 mg/dL    Nitrite NEGATIVE  NEGATIVE    Leukocytes, UA MODERATE (*) NEGATIVE   URINE MICROSCOPIC-ADD ON     Status: Normal   Collection Time   10/10/12 11:31 AM      Component Value Range Comment   Squamous Epithelial / LPF RARE  RARE    WBC, UA 3-6  <3 WBC/hpf    Urine-Other AMORPHOUS URATES/PHOSPHATES       Radiological Exams on Admission: *RADIOLOGY REPORT*  Clinical Data: Chest discomfort  CHEST - 2 VIEW  Comparison: 05/08/2012  Findings: Thoracic kyphoscoliosis. Diffuse osteopenia. Lungs are  clear, mildly hyperinflated. Heart size within normal limits. No  effusion.  IMPRESSION:  No acute disease  Original Report Authenticated By: Osa Craver, M.D.   Assessment/Plan Active Problems:  1. Dehydration: Patient prensented with progressive weakness, and appeared clinically mildly dehydrated, although she has no dizziness or orthostasis. She was found to have an elevated BUN/Creatinine ration, with BUN 24, creatinine 1.19, consistent with mild dehydration, probably secondary to diuretic therapy, against a background of poor oral intake. We shall  discontinue Torsemide, and rehydrate gently.  2. UTI (lower urinary tract infection)/Candiduria: Patient has a known history of recurrent UTI, possibly on the basis of a bladder diverticulum, and had a U/A & Culture done on 10/07/12, which revealed a funguria. She was prescribed Diflucan by her PMD, but has not yet started this. We shall treat with a 5-day course of oral Diflucan. Urinalysis today, shows equivocal findings, and patient was commenced on iv Rocephin by ED MD we shall continue this for now, and aim a 3-day course, only.  3. Weakness generalized: Patient states that she has been weak for a few months, although this has recently worsened. #1 & 2 above are contributory, as is her advanced age. We shall however, check TSH for completeness, particularly, in view of her bradycardia. We shall arrange PT/OT evaluation.  4. Bradycardia/RBBB: Patient has long-standing brady cardia, with baseline HR in the 50s-60s, per EMR, and her 12-lead EKG of 09/13/09, reveals RBBB. Today's EKG is essentially unchanged, and although a HR of 47/min was documented in ED, she is asymptomatic, without chest pain, SOB or dizziness. There does not appear to be any indication for a pacemaker at this time. WE shall check TSH as described above. Patient is on no AV-blocking medication.  5. GERD (gastroesophageal reflux disease): Asymptomatic.  6. Constipation: Per patient, she does tend to get constipated, and has had no bowel movement for 3 days. She has no abdominal pain, and abdominal examination is benign. We shall manage with laxatives.  7. HTN (hypertension): BP is mildly elevated. Continue pre-admission antihypertensives and monitor. 8. Anemia: Patient has a mild normocytic anemia. In  the setting of advanced age, weakness, poor appetite and constipation, the question of a possible GI malignancy has to be entertained. We shall check anemia panel, but it is clear that aggressive work up would be neither beneficial or  humane.   Further management will depend on clinical course.   Comment: I have discussed with patient's son and care-giver, Chundra Sauerwein (Tel: [phone]), and updated him fully on patient's condition. He has confirmed that patient is DNR/DNI.  Note: Patient's Daughter, Sallyanne Kuster is another main contact (Tel: [phone]).   Time Spent on Admission: 45 mins.   Davison Ohms,CHRISTOPHER 10/10/2012, 2:24 PM

## 2012-10-10 NOTE — ED Notes (Signed)
"  I just felt worse today. They are always tell me to eat more but I don't have an appetite and I just try to drink juices."   Pt uses a walker at home. Lives at home with son.

## 2012-10-11 ENCOUNTER — Encounter (HOSPITAL_COMMUNITY): Payer: Self-pay | Admitting: *Deleted

## 2012-10-11 DIAGNOSIS — Z5189 Encounter for other specified aftercare: Secondary | ICD-10-CM | POA: Diagnosis not present

## 2012-10-11 DIAGNOSIS — E785 Hyperlipidemia, unspecified: Secondary | ICD-10-CM | POA: Diagnosis not present

## 2012-10-11 DIAGNOSIS — R609 Edema, unspecified: Secondary | ICD-10-CM | POA: Diagnosis not present

## 2012-10-11 DIAGNOSIS — IMO0002 Reserved for concepts with insufficient information to code with codable children: Secondary | ICD-10-CM | POA: Diagnosis not present

## 2012-10-11 DIAGNOSIS — B3749 Other urogenital candidiasis: Secondary | ICD-10-CM | POA: Diagnosis not present

## 2012-10-11 DIAGNOSIS — E860 Dehydration: Secondary | ICD-10-CM | POA: Diagnosis not present

## 2012-10-11 DIAGNOSIS — Z79899 Other long term (current) drug therapy: Secondary | ICD-10-CM | POA: Diagnosis not present

## 2012-10-11 DIAGNOSIS — R5381 Other malaise: Secondary | ICD-10-CM | POA: Diagnosis present

## 2012-10-11 DIAGNOSIS — E7800 Pure hypercholesterolemia, unspecified: Secondary | ICD-10-CM | POA: Diagnosis present

## 2012-10-11 DIAGNOSIS — I498 Other specified cardiac arrhythmias: Secondary | ICD-10-CM | POA: Diagnosis not present

## 2012-10-11 DIAGNOSIS — G589 Mononeuropathy, unspecified: Secondary | ICD-10-CM | POA: Diagnosis present

## 2012-10-11 DIAGNOSIS — K5900 Constipation, unspecified: Secondary | ICD-10-CM | POA: Diagnosis not present

## 2012-10-11 DIAGNOSIS — K219 Gastro-esophageal reflux disease without esophagitis: Secondary | ICD-10-CM | POA: Diagnosis present

## 2012-10-11 DIAGNOSIS — D649 Anemia, unspecified: Secondary | ICD-10-CM | POA: Diagnosis present

## 2012-10-11 DIAGNOSIS — R32 Unspecified urinary incontinence: Secondary | ICD-10-CM | POA: Diagnosis not present

## 2012-10-11 DIAGNOSIS — K5730 Diverticulosis of large intestine without perforation or abscess without bleeding: Secondary | ICD-10-CM | POA: Diagnosis not present

## 2012-10-11 DIAGNOSIS — E8889 Other specified metabolic disorders: Secondary | ICD-10-CM | POA: Diagnosis present

## 2012-10-11 DIAGNOSIS — K2100 Gastro-esophageal reflux disease with esophagitis, without bleeding: Secondary | ICD-10-CM | POA: Diagnosis not present

## 2012-10-11 DIAGNOSIS — J209 Acute bronchitis, unspecified: Secondary | ICD-10-CM | POA: Diagnosis not present

## 2012-10-11 DIAGNOSIS — R634 Abnormal weight loss: Secondary | ICD-10-CM | POA: Diagnosis not present

## 2012-10-11 DIAGNOSIS — N390 Urinary tract infection, site not specified: Secondary | ICD-10-CM | POA: Diagnosis not present

## 2012-10-11 DIAGNOSIS — N323 Diverticulum of bladder: Secondary | ICD-10-CM | POA: Diagnosis present

## 2012-10-11 DIAGNOSIS — E43 Unspecified severe protein-calorie malnutrition: Secondary | ICD-10-CM | POA: Diagnosis present

## 2012-10-11 DIAGNOSIS — K222 Esophageal obstruction: Secondary | ICD-10-CM | POA: Diagnosis present

## 2012-10-11 DIAGNOSIS — Z66 Do not resuscitate: Secondary | ICD-10-CM | POA: Diagnosis present

## 2012-10-11 DIAGNOSIS — M810 Age-related osteoporosis without current pathological fracture: Secondary | ICD-10-CM | POA: Diagnosis present

## 2012-10-11 DIAGNOSIS — I872 Venous insufficiency (chronic) (peripheral): Secondary | ICD-10-CM | POA: Diagnosis present

## 2012-10-11 DIAGNOSIS — I4510 Unspecified right bundle-branch block: Secondary | ICD-10-CM | POA: Diagnosis present

## 2012-10-11 DIAGNOSIS — F0390 Unspecified dementia without behavioral disturbance: Secondary | ICD-10-CM | POA: Diagnosis not present

## 2012-10-11 DIAGNOSIS — I10 Essential (primary) hypertension: Secondary | ICD-10-CM | POA: Diagnosis present

## 2012-10-11 LAB — CBC
Hemoglobin: 10.7 g/dL — ABNORMAL LOW (ref 12.0–15.0)
MCV: 86.6 fL (ref 78.0–100.0)
Platelets: 223 10*3/uL (ref 150–400)
RBC: 3.81 MIL/uL — ABNORMAL LOW (ref 3.87–5.11)
WBC: 5.4 10*3/uL (ref 4.0–10.5)

## 2012-10-11 LAB — COMPREHENSIVE METABOLIC PANEL
Albumin: 2.8 g/dL — ABNORMAL LOW (ref 3.5–5.2)
BUN: 18 mg/dL (ref 6–23)
Calcium: 10.9 mg/dL — ABNORMAL HIGH (ref 8.4–10.5)
Chloride: 105 mEq/L (ref 96–112)
Creatinine, Ser: 1.09 mg/dL (ref 0.50–1.10)
Total Bilirubin: 0.2 mg/dL — ABNORMAL LOW (ref 0.3–1.2)
Total Protein: 5.5 g/dL — ABNORMAL LOW (ref 6.0–8.3)

## 2012-10-11 MED ORDER — ENSURE PUDDING PO PUDG
1.0000 | Freq: Two times a day (BID) | ORAL | Status: DC
  Administered 2012-10-12 – 2012-10-15 (×4): 1 via ORAL

## 2012-10-11 MED ORDER — BISACODYL 5 MG PO TBEC
5.0000 mg | DELAYED_RELEASE_TABLET | Freq: Once | ORAL | Status: DC
  Filled 2012-10-11: qty 1

## 2012-10-11 MED ORDER — DOCUSATE SODIUM 100 MG PO CAPS
100.0000 mg | ORAL_CAPSULE | Freq: Two times a day (BID) | ORAL | Status: DC
  Administered 2012-10-11 – 2012-10-12 (×2): 100 mg via ORAL
  Filled 2012-10-11 (×10): qty 1

## 2012-10-11 NOTE — Progress Notes (Signed)
Utilization Review Completed.  

## 2012-10-11 NOTE — Progress Notes (Signed)
[facility] PROGRESS NOTE  Galen Malkowski ZOX:[id number] DOB: December 21, 1910 DOA: 10/10/2012 PCP: [name], MD  Assessment/Plan:  1. Dehydration:  Patient states her weakness is marginally improved with hydration.  BUN and creatinine trending down slightly -  Continue to hold torsemide  -  Hold diamox also -  Continue IVF -  Monitor for pulmonary edema and anasarca given hypoalbuminemia -  Repeat electrolytes in AM  2. UTI (lower urinary tract infection)/Candiduria:  Patient has recurrent UTI with bladder diverticulum.  UTI may be contributing to fatigue so will consider UTI symptomatic, but would otherwise not treat candida in urine.   -  F/u urine culture -  Continue fluconazole, day 2 -  Continue rocephin pending results of urine culture.    3. Weakness generalized: Patient states that she has been weak for a few months, although this has recently worsened. #1 & 2 above are contributory, as is her advanced age.  -  TSH wnl - Check phosphorous  -  PT recommending home health  4. Bradycardia/RBBB: Patient has long-standing brady cardia, with baseline HR in the 50s-60s, per EMR, and her 12-lead EKG of 09/13/09, reveals RBBB. Current EKG is essentially unchanged.   -  Check heart rate with ambulation   5. GERD (gastroesophageal reflux disease): Asymptomatic.   6. Constipation:  Still no BM after starting miralax -  Add colace and bisacodyl.  Decrease if BM.  7. HTN (hypertension): BP is low normal today.   -  Hold diamox -  DC lisinopril   8. Anemia: Patient has a mild normocytic anemia. In the setting of advanced age, weakness, poor appetite and constipation, the question of a possible GI malignancy has to be entertained. -  Iron panel not consistent with iron deficiency -  Folate wnl -  Vitamin B12 wnl -  Outpatient follow up  Severe protein calorie malnutrition -  Liberalize diet -  Add ensure between meals -  MVI  DIET:   Regular ACCESS:  PIV IVF:  NS at  1ml/h PROPH:  heparin  Comment: I have discussed with patient's son and care-giver, Merrie Epler (Tel: [phone]), and updated him fully on patient's condition. He has confirmed that patient is DNR/DNI.  Note: Patient's Daughter, Sallyanne Kuster is another main contact (Tel: [phone]).   Code Status: DNR Family Communication: Spoke with patient, patient's husband today at bedside.  Left message for Sallyanne Kuster in case she would like family updates.   Disposition Plan: Pending improvement in blood pressure, weakness, and rehydrated  HPI: This is a 78 year old female, with history of HTN, dyslipidemia, GERD/esophageal stricture, DJD, osteoporosis, urinary incontinence/bladder diverticulum, recurrent UTIs, neuropathy, anxiety, previous shingles, diverticulosis, dementia, chronic venous insufficiency, s/p vesicovaginal fistula closure/TAH, presenting with above symptoms. According to patient, she has been progressively weak for the past few months, but this has become worse in the last 2 weeks. Appetite is poor, but she has had no fever, chill, abdominal pain, vomiting or diarrhea. She has had no cough or shortness of breath. Patient was seen at her PMD's office on 10/07/12, by PA, and Norvasc was discontinued, for "over-compensated blood pressure". U/A and culture were also done at that time. Per ED MD, She was found to be bradycardic in the ED, with HR occasionally down to the 40s, although she has no complaint of dizziness, chest pain or SOB.    Consultants:  None  Procedures:  None  Antibiotics:  Ceftriaxone 10/26>> day 2  Fluconazole 10/26>> day 2  HPI/Subjective:  Patient states that she continues to feel weak and that she does not feel like she is back to her baseline.  She has ambulated a [address] ways in the hall with some difficulty.  Denies chest pain, shortness fo breath, nausea, vomiting, diarrhea.  She has not had a BM.   Objective: Filed Vitals:    10/11/12 0500 10/11/12 0943 10/11/12 1003 10/11/12 1406  BP: 143/66 97/47  106/45  Pulse: 45  59 51  Temp: 98.3 F (36.8 C)   98.5 F (36.9 C)  TempSrc: Oral   Oral  Resp: 18   16  Height:      Weight:      SpO2: 100%   100%    Intake/Output Summary (Last 24 hours) at 10/11/12 1714 Last data filed at 10/11/12 1300  Gross per 24 hour  Intake    643 ml  Output    300 ml  Net    343 ml   Filed Weights   10/10/12 0910 10/10/12 1800  Weight: 46.72 kg (103 lb) 47.492 kg (104 lb 11.2 oz)    Exam:   General:  Cachectic AAF, no acute distress sitting on bed  HEENT:  MMM, but just drank sip of water  Skin:  Skin tenting and dry skin  Cardiovascular: Bradycardic, 3/6 murmur across precordium  Respiratory: CTAB  Abdomen: NABS, soft, nondistended, nontender  MSK:  No LEE  Data Reviewed: Basic Metabolic Panel:  Lab 10/11/12 4098 10/10/12 1456 10/10/12 0949 10/07/12 1210  NA 137 -- 139 140  K 3.5 -- 3.8 3.8  CL 105 -- 106 107  CO2 24 -- 29 27  GLUCOSE 89 -- 95 141*  BUN 18 -- 24* 27*  CREATININE 1.09 1.18* 1.19* 1.2  CALCIUM 10.9* -- 11.9* 12.4*  MG -- 2.1 2.2 --  PHOS -- -- -- --   Liver Function Tests:  Lab 10/11/12 0342 10/10/12 0949  AST 20 21  ALT 12 14  ALKPHOS 42 47  BILITOT 0.2* 0.3  PROT 5.5* 6.2  ALBUMIN 2.8* 3.3*   No results found for this basename: LIPASE:5,AMYLASE:5 in the last 168 hours No results found for this basename: AMMONIA:5 in the last 168 hours CBC:  Lab 10/11/12 0342 10/10/12 1456 10/10/12 0949  WBC 5.4 5.7 6.0  NEUTROABS -- -- --  HGB 10.7* 11.8* 11.5*  HCT 33.0* 35.9* 36.2  MCV 86.6 86.9 86.4  PLT 223 240 248   Cardiac Enzymes:  Lab 10/11/12 0342 10/10/12 2024 10/10/12 1457 10/10/12 0951  CKTOTAL -- -- -- --  CKMB -- -- -- --  CKMBINDEX -- -- -- --  TROPONINI <0.30 <0.30 <0.30 <0.30   BNP (last 3 results)  Basename 02/27/12 1251  PROBNP 101.0*   CBG: No results found for this basename: GLUCAP:5 in the last 168  hours  Recent Results (from the past 240 hour(s))  CULTURE, URINE COMPREHENSIVE     Status: Normal   Collection Time   10/07/12 12:13 PM      Component Value Range Status Comment   Colony Count 80,000 COLONIES/ML   Final    Organism ID, Bacteria Yeast   Final      Studies: Dg Chest 2 View  10/10/2012  *RADIOLOGY REPORT*  Clinical Data: Chest discomfort  CHEST - 2 VIEW  Comparison: 05/08/2012  Findings: Thoracic kyphoscoliosis.  Diffuse osteopenia.  Lungs are clear, mildly hyperinflated.  Heart size within normal limits.  No effusion.  IMPRESSION:  No acute disease  Original Report Authenticated By: Thora Lance III, M.D.     Scheduled Meds:   . acetaZOLAMIDE  250 mg Oral Daily  . aspirin EC  81 mg Oral Daily  . cefTRIAXone (ROCEPHIN)  IV  1 g Intravenous Q24H  . fluconazole  100 mg Oral Daily  . heparin  5,000 Units Subcutaneous Q8H  . lisinopril  10 mg Oral Daily  . multivitamin with minerals  1 tablet Oral Daily  . polyethylene glycol  17 g Oral Daily  . sodium chloride  3 mL Intravenous Q12H  . DISCONTD: cefTRIAXone (ROCEPHIN)  IV  1 g Intravenous Q24H  . DISCONTD: multivitamin  1 tablet Oral Daily   Continuous Infusions:   . sodium chloride 1,000 mL (10/11/12 0958)    Active Problems:  Dehydration  UTI (lower urinary tract infection)  Candiduria  Weakness generalized  Bradycardia  RBBB  GERD (gastroesophageal reflux disease)  Constipation  HTN (hypertension)    Time spent: 30    Yahye Siebert, [facility]  [facility] Pager [phone]. If 8PM-8AM, please contact night-coverage at [email or web address], password [facility] 10/11/2012, 5:14 PM  LOS: 1 day

## 2012-10-11 NOTE — Evaluation (Signed)
Physical Therapy Evaluation Patient Details Name: Jessica [name] MRN: [id number] DOB: [birth year] Today's Date: 10/11/2012 Time: [id number] PT Time Calculation (min): 39 min  PT Assessment / Plan / Recommendation Clinical Impression  Patient is a 78 yo female admitted with dehydration and weakness.  Patient with general weakness impacting mobility.  Patient will benefit from acute PT to maximize independence prior to return home.  Recommend HHPT at discharge for continued therapy.    PT Assessment  Patient needs continued PT services    Follow Up Recommendations  Home health PT;Supervision/Assistance - 24 hour    Does the patient have the potential to tolerate intense rehabilitation      Barriers to Discharge None      Equipment Recommendations  3 in 1 bedside comode    Recommendations for Other Services     Frequency Min 3X/week    Precautions / Restrictions Precautions Precautions: Fall Restrictions Weight Bearing Restrictions: No   Pertinent Vitals/Pain       Mobility  Bed Mobility Bed Mobility: Supine to Sit;Sit to Supine;Sitting - Scoot to Edge of Bed Supine to Sit: 4: Min guard;With rails;HOB elevated Sitting - Scoot to Edge of Bed: 4: Min guard;With rail Sit to Supine: 4: Min guard;With rail;HOB elevated Details for Bed Mobility Assistance: Verbal cues for technique. No physical assist needed Transfers Transfers: Sit to Stand;Stand to Sit Sit to Stand: 4: Min guard;With upper extremity assist;From bed Stand to Sit: 4: Min guard;With upper extremity assist;To bed Details for Transfer Assistance: Verbal cues for hand placement. Ambulation/Gait Ambulation/Gait Assistance: 4: Min guard Ambulation Distance (Feet): 176 Feet Assistive device: Rolling walker Ambulation/Gait Assistance Details: Verbal cues to stay close to RW and stand upright.  Patient safely maneuvers RW. Gait Pattern: Step-through pattern;Decreased step length - right;Decreased step length -  left;Decreased stride length;Trunk flexed Gait velocity: Slow gait speed           PT Diagnosis: Difficulty walking;Abnormality of gait;Generalized weakness  PT Problem List: Decreased strength;Decreased activity tolerance;Decreased mobility;Cardiopulmonary status limiting activity PT Treatment Interventions: DME instruction;Gait training;Functional mobility training;Therapeutic exercise;Patient/family education   PT Goals Acute Rehab PT Goals PT Goal Formulation: With patient Time For Goal Achievement: 10/18/12 Potential to Achieve Goals: Good Pt will go Supine/Side to Sit: with supervision PT Goal: Supine/Side to Sit - Progress: Goal set today Pt will go Sit to Supine/Side: with supervision PT Goal: Sit to Supine/Side - Progress: Goal set today Pt will go Sit to Stand: with supervision;with upper extremity assist PT Goal: Sit to Stand - Progress: Goal set today Pt will go Stand to Sit: with supervision;with upper extremity assist PT Goal: Stand to Sit - Progress: Goal set today Pt will Ambulate: >150 feet;with supervision;with rolling walker PT Goal: Ambulate - Progress: Goal set today  Visit Information  Last PT Received On: 10/11/12 Assistance Needed: +1    Subjective Data  Subjective: "I'd like to get up and move" Patient Stated Goal: To go home soon   Prior Functioning  Home Living Lives With: Spouse Available Help at Discharge: Family;Personal care attendant;Available 24 hours/day (Aide 9:00-1:00 daily; Son spends night; Daughter in evenings) Type of Home: House Home Access: Level entry Home Layout: One level Bathroom Shower/Tub: Tub/shower unit (Patient takes a "Emergency planning/management officer: Standard Bathroom Accessibility: Yes How Accessible: Accessible via walker Home Adaptive Equipment: Straight cane;Walker - rolling;Shower chair with back;Wheelchair - manual Additional Comments: Husband is sick per patient.  She states they are not ever at home alone. Prior  Function Level of Independence:  Independent with assistive device(s);Needs assistance Needs Assistance: Meal Prep;Light Housekeeping Meal Prep: Total Light Housekeeping: Total Able to Take Stairs?: No Driving: No Vocation: Retired Musician: No difficulties    Cognition  Overall Cognitive Status: Appears within functional limits for tasks assessed/performed Arousal/Alertness: Awake/alert Orientation Level: Oriented X4 / Intact Behavior During Session: WFL for tasks performed Cognition - Other Comments: Slow to respond at times    Extremity/Trunk Assessment Right Upper Extremity Assessment RUE ROM/Strength/Tone: WFL for tasks assessed RUE Sensation: WFL - Light Touch Left Upper Extremity Assessment LUE ROM/Strength/Tone: WFL for tasks assessed LUE Sensation: WFL - Light Touch Right Lower Extremity Assessment RLE ROM/Strength/Tone: Deficits RLE ROM/Strength/Tone Deficits: Strength 4/5 RLE Sensation: WFL - Light Touch Left Lower Extremity Assessment LLE ROM/Strength/Tone: Deficits LLE ROM/Strength/Tone Deficits: Strength 4/5 LLE Sensation: WFL - Light Touch Trunk Assessment Trunk Assessment: Kyphotic (Scoliotic)   Balance Balance Balance Assessed: No  End of Session PT - End of Session Equipment Utilized During Treatment: Gait belt Activity Tolerance: Patient tolerated treatment well Patient left: in bed;with call bell/phone within reach Nurse Communication: Mobility status (Encouraged patient to ambulate with nursing in hallway)  GP Functional Assessment Tool Used: Clinical judgement Functional Limitation: Mobility: Walking and moving around Mobility: Walking and Moving Around Current Status ([id number]): At least 20 percent but less than 40 percent impaired, limited or restricted Mobility: Walking and Moving Around Goal Status [phone]): At least 1 percent but less than 20 percent impaired, limited or restricted   Vena Austria 10/11/2012, 1:23 PM Durenda Hurt.  Renaldo Fiddler, [facility] Acute Rehab Services Pager [phone]

## 2012-10-12 DIAGNOSIS — J209 Acute bronchitis, unspecified: Secondary | ICD-10-CM

## 2012-10-12 DIAGNOSIS — R634 Abnormal weight loss: Secondary | ICD-10-CM

## 2012-10-12 DIAGNOSIS — E860 Dehydration: Secondary | ICD-10-CM | POA: Diagnosis not present

## 2012-10-12 DIAGNOSIS — N390 Urinary tract infection, site not specified: Secondary | ICD-10-CM

## 2012-10-12 LAB — URINE MICROSCOPIC-ADD ON

## 2012-10-12 LAB — URINALYSIS, ROUTINE W REFLEX MICROSCOPIC
Nitrite: NEGATIVE
Specific Gravity, Urine: 1.014 (ref 1.005–1.030)
Urobilinogen, UA: 0.2 mg/dL (ref 0.0–1.0)

## 2012-10-12 LAB — URINE CULTURE: Colony Count: >=100000

## 2012-10-12 LAB — BASIC METABOLIC PANEL
Chloride: 112 mEq/L (ref 96–112)
Chloride: 106 mEq/L (ref 96–112)
Creatinine, Ser: 1.13 mg/dL — ABNORMAL HIGH (ref 0.50–1.10)
GFR calc Af Amer: 51 mL/min — ABNORMAL LOW (ref >90)
GFR calc Af Amer: 45 mL/min — ABNORMAL LOW (ref >90)
Potassium: 3.4 mEq/L — ABNORMAL LOW (ref 3.5–5.1)
Potassium: 3.6 mEq/L (ref 3.5–5.1)

## 2012-10-12 LAB — PHOSPHORUS
Phosphorus: 1.8 mg/dL — ABNORMAL LOW (ref 2.3–4.6)
Phosphorus: 3.9 mg/dL (ref 2.3–4.6)

## 2012-10-12 MED ORDER — CIPROFLOXACIN IN D5W 200 MG/100ML IV SOLN
200.0000 mg | INTRAVENOUS | Status: DC
  Administered 2012-10-13: 200 mg via INTRAVENOUS
  Filled 2012-10-12: qty 100

## 2012-10-12 MED ORDER — POTASSIUM PHOSPHATE DIBASIC 3 MMOLE/ML IV SOLN
40.0000 meq | Freq: Once | INTRAVENOUS | Status: AC
  Administered 2012-10-12: 40 meq via INTRAVENOUS
  Filled 2012-10-12: qty 9.09

## 2012-10-12 MED ORDER — ENSURE COMPLETE PO LIQD
237.0000 mL | Freq: Two times a day (BID) | ORAL | Status: DC
  Administered 2012-10-13 – 2012-10-15 (×5): 237 mL via ORAL

## 2012-10-12 MED ORDER — CIPROFLOXACIN IN D5W 400 MG/200ML IV SOLN
400.0000 mg | Freq: Once | INTRAVENOUS | Status: AC
  Administered 2012-10-12: 400 mg via INTRAVENOUS
  Filled 2012-10-12: qty 200

## 2012-10-12 MED ORDER — POTASSIUM CHLORIDE CRYS ER 20 MEQ PO TBCR: 40.0000 meq | EXTENDED_RELEASE_TABLET | Freq: Once | ORAL | Status: DC

## 2012-10-12 NOTE — Evaluation (Signed)
Occupational Therapy Evaluation Patient Details Name: Jessica [name] MRN: [id number] DOB: 1911-12-14 Today's Date: 10/12/2012 Time: 4401-0272 OT Time Calculation (min): 34 min  OT Assessment / Plan / Recommendation Clinical Impression  Pt admitted with weakness and dehydration and presents to OT with generalized weakness which impacts pt's safety and independence with ADLs and mobility. Pt will benefit from skilled OT in the acute setting to maximize I with ADL and ADL mobility prior to d/c home    OT Assessment  Patient needs continued OT Services    Follow Up Recommendations  Home health OT;Supervision/Assistance - 24 hour    Barriers to Discharge      Equipment Recommendations  3 in 1 bedside comode    Recommendations for Other Services    Frequency  Min 2X/week    Precautions / Restrictions Precautions Precautions: Fall Restrictions Weight Bearing Restrictions: No   Pertinent Vitals/Pain Pt denies any pain    ADL  Grooming: Performed;Wash/dry hands;Min guard Where Assessed - Grooming: Unsupported standing Lower Body Bathing: Performed;Minimal assistance Where Assessed - Lower Body Bathing: Supported standing Lower Body Dressing: Performed;Min guard Where Assessed - Lower Body Dressing: Supported sit to Pharmacist, hospital: Performed;Minimal Dentist Method: Sit to Barista: Regular height toilet;Grab bars Toileting - Clothing Manipulation and Hygiene: Performed;Minimal assistance Where Assessed - Engineer, mining and Hygiene: Sit to stand from 3-in-1 or toilet Equipment Used: Gait belt;Rolling walker Transfers/Ambulation Related to ADLs: Min A needed to manipulate RW to into bathroom; pt able to problem solve how to get out with Min guard A. Min guard A RW ambulation throughout room ADL Comments: Pt doing well; moves slowly and is easily fatigued but is determined to do as much for herself as possible.    OT  Diagnosis: Generalized weakness  OT Problem List: Decreased strength;Decreased activity tolerance OT Treatment Interventions: Self-care/ADL training;DME and/or AE instruction;Therapeutic activities;Patient/family education;Balance training   OT Goals Acute Rehab OT Goals OT Goal Formulation: With patient Time For Goal Achievement: 10/19/12 Potential to Achieve Goals: Good ADL Goals Pt Will Perform Grooming: with supervision;Standing at sink ADL Goal: Grooming - Progress: Goal set today Pt Will Perform Upper Body Dressing: with set-up;Sitting, chair;Sitting, bed ADL Goal: Upper Body Dressing - Progress: Goal set today Pt Will Perform Lower Body Dressing: with set-up;with supervision;Sit to stand from chair;Sit to stand from bed ADL Goal: Lower Body Dressing - Progress: Goal set today Pt Will Transfer to Toilet: with supervision;Ambulation ADL Goal: Toilet Transfer - Progress: Goal set today Pt Will Perform Toileting - Clothing Manipulation: with supervision;Standing ADL Goal: Toileting - Clothing Manipulation - Progress: Goal set today Pt Will Perform Toileting - Hygiene: with supervision;Sit to stand from 3-in-1/toilet ADL Goal: Toileting - Hygiene - Progress: Goal set today Additional ADL Goal #1: Pt will be I with bil UE strengthening HEP in prep for increased I with ADLs. ADL Goal: Additional Goal #1 - Progress: Goal set today  Visit Information  Last OT Received On: 10/12/12 Assistance Needed: +1    Subjective Data  Subjective: I like to try to do things on my own, if I can. Patient Stated Goal: Return home   Prior Functioning     Home Living Lives With: Spouse Available Help at Discharge: Family;Personal care attendant;Available 24 hours/day (Aide 9:00-1:00 daily; Son spends night; Daughter in evenings) Type of Home: House Home Access: Level entry Home Layout: One level Bathroom Shower/Tub: Tub/shower unit (Patient takes a "Emergency planning/management officer:  Standard Bathroom Accessibility: Yes How Accessible:  Accessible via walker Home Adaptive Equipment: Straight cane;Walker - rolling;Shower chair with back;Wheelchair - manual Additional Comments: Husband is sick per patient.  She states they are not ever at home alone. Prior Function Level of Independence: Independent with assistive device(s);Needs assistance Needs Assistance: Meal Prep;Light Housekeeping Meal Prep: Total Light Housekeeping: Total Able to Take Stairs?: No Driving: No Vocation: Retired Musician: No difficulties Dominant Hand: Right         Vision/Perception     Cognition  Overall Cognitive Status: Appears within functional limits for tasks assessed/performed Arousal/Alertness: Awake/alert Orientation Level: Oriented X4 / Intact Behavior During Session: WFL for tasks performed    Extremity/Trunk Assessment Right Upper Extremity Assessment RUE ROM/Strength/Tone: Deficits RUE ROM/Strength/Tone Deficits: grossly 4+/5 and easily fatigues RUE Sensation: WFL - Light Touch RUE Coordination: WFL - gross/fine motor Left Upper Extremity Assessment LUE ROM/Strength/Tone: Deficits LUE ROM/Strength/Tone Deficits: grossly 4+/5 and easily fatigues LUE Sensation: WFL - Light Touch LUE Coordination: WFL - gross/fine motor     Mobility Bed Mobility Supine to Sit: 5: Supervision;HOB elevated;With rails Sitting - Scoot to Edge of Bed: 5: Supervision;With rail Transfers Sit to Stand: From toilet;With armrests;From bed;With upper extremity assist;4: Min guard Stand to Sit: 4: Min guard;With armrests;To toilet;To chair/3-in-1 Details for Transfer Assistance: Verbal cues for hand placement.     Shoulder Instructions     Exercise     Balance     End of Session OT - End of Session Equipment Utilized During Treatment: Gait belt Activity Tolerance: Patient tolerated treatment well Patient left: in chair;with call bell/phone within reach;with chair  alarm set Nurse Communication: Mobility status  GO     Jessica [name] 10/12/2012, 10:42 AM

## 2012-10-12 NOTE — Progress Notes (Signed)
[facility] PROGRESS NOTE  Jessica [name] BMW:[id number] DOB: August 02, 1911 DOA: 10/10/2012 PCP: [name], MD  Assessment/Plan:  Weakness generalized: Patient states that she has been weak for a few months, although this has recently worsened.  Given phosphorous and potassium levels, she likely has hypophosphatemic muscle weakness and refeeding syndrome.  Dehydration and possible UTI may have exacerbated her symptoms.  -  TSH wnl -  See below for nutrition and electrolyte management -  PT/OT recommending home health with 24 hour supervision  Severe protein calorie malnutrition with hypokalemia, hypophosphatemia.  At risk for hypomagnesemia. -  Appreciate nutrition recommendations -  Repleted with KPhos IV -  Repeat BMP, Mg, Phos this evening and in AM -  Liberalize diet -  Add ensure between meals -  MVI  Dehydration, resolving:  Patient states her weakness is marginally improved with hydration.  BUN and creatinine continuing to trend down.   -  Continue to hold torsemide  -  Continue to hold diamox -  Continue IVF -  Monitor for pulmonary edema and anasarca given hypoalbuminemia  UTI (lower urinary tract infection)/Candiduria:  Patient has recurrent UTI with bladder diverticulum.  UTI may be contributing to fatigue so will consider UTI symptomatic, but would otherwise not treat candida in urine.  Initial culture with mixed flora.   Repeat urinalysis has increasing leukocytes and  7-10 WBC despite ceftriaxone.   -  F/u repeat urine culture -  Continue fluconazole, day 3 -  DC rocephin, start ciprofloxacin  Bradycardia/RBBB: Patient has long-standing brady cardia, with baseline HR in the 50s-60s, per EMR, and her 12-lead EKG of 09/13/09, reveals RBBB. Current EKG is unchanged.  HR in 40s today. -  Check heart rate with ambulation   GERD (gastroesophageal reflux disease): Asymptomatic.   Constipation:  Resolved -  Continue colace and miralax  HTN (hypertension): BP  is low normal today.   -  Hold diamox and lisinopril   Anemia: Patient has a mild normocytic anemia. In the setting of advanced age, weakness, poor appetite and constipation, the question of a possible GI malignancy has to be entertained. -  Iron panel not consistent with iron deficiency -  Folate wnl -  Vitamin B12 wnl -  Outpatient follow up  DIET:   Regular ACCESS:  PIV IVF:  OFF because large volume with Kphos infusion PROPH:  heparin  Code Status: DNR Family Communication: Spoke with patient, patient's husband today at bedside.  Left message for Sallyanne Kuster in case she would like family updates.   Sallyanne Kuster is another main contact (Tel: [phone]).  son and care-giver, Merrianne Mozer (Tel: [phone]) Disposition Plan: Pending improvement in blood pressure, weakness, and rehydrated  HPI: This is a 78 year old female, with history of HTN, dyslipidemia, GERD/esophageal stricture, DJD, osteoporosis, urinary incontinence/bladder diverticulum, recurrent UTIs, neuropathy, anxiety, previous shingles, diverticulosis, dementia, chronic venous insufficiency, s/p vesicovaginal fistula closure/TAH, presenting with above symptoms. According to patient, she has been progressively weak for the past few months, but this has become worse in the last 2 weeks. Appetite is poor, but she has had no fever, chill, abdominal pain, vomiting or diarrhea. She has had no cough or shortness of breath. Patient was seen at her PMD's office on 10/07/12, by PA, and Norvasc was discontinued, for "over-compensated blood pressure". U/A and culture were also done at that time. Per ED MD, She was found to be bradycardic in the ED, with HR occasionally down to the 40s, although she has no complaint  of dizziness, chest pain or SOB.    Consultants:  Nutrition  Procedures:  None  Antibiotics:  Ceftriaxone 10/26>> 10/27  Fluconazole 10/26>> day 3   HPI/Subjective:  Patient states that she  continues to feel weak.  She ambulated a [address] ways in the hall with some difficulty.  Denies chest pain, shortness fo breath, nausea, vomiting, diarrhea.  She had a BM.   Objective: Filed Vitals:   10/11/12 1406 10/11/12 2100 10/12/12 0500 10/12/12 1400  BP: 106/45 129/53 128/61 152/74  Pulse: 51 45 45 43  Temp: 98.5 F (36.9 C) 98.6 F (37 C) 98.4 F (36.9 C) 98.7 F (37.1 C)  TempSrc: Oral Oral Oral Oral  Resp: 16 18 18 18   Height:      Weight:      SpO2: 100% 99% 98% 99%    Intake/Output Summary (Last 24 hours) at 10/12/12 1648 Last data filed at 10/11/12 1830  Gross per 24 hour  Intake    400 ml  Output      0 ml  Net    400 ml   Filed Weights   10/10/12 0910 10/10/12 1800  Weight: 46.72 kg (103 lb) 47.492 kg (104 lb 11.2 oz)    Exam:   General:  Cachectic AAF, no acute distress sitting on bed  HEENT:  MMM  Skin:  Skin tenting resolving  Cardiovascular: Bradycardic, 3/6 murmur across precordium  Respiratory: CTAB  Abdomen: NABS, soft, nondistended, nontender  MSK:  No LEE  Data Reviewed: Basic Metabolic Panel:  Lab 10/12/12 4540 10/11/12 0342 10/10/12 1456 10/10/12 0949 10/07/12 1210  NA 140 137 -- 139 140  K 3.4* 3.5 -- 3.8 3.8  CL 112 105 -- 106 107  CO2 23 24 -- 29 27  GLUCOSE 84 89 -- 95 141*  BUN 14 18 -- 24* 27*  CREATININE 1.02 1.09 1.18* 1.19* 1.2  CALCIUM 10.1 10.9* -- 11.9* 12.4*  MG -- -- 2.1 2.2 --  PHOS 1.8* -- -- -- --   Liver Function Tests:  Lab 10/11/12 0342 10/10/12 0949  AST 20 21  ALT 12 14  ALKPHOS 42 47  BILITOT 0.2* 0.3  PROT 5.5* 6.2  ALBUMIN 2.8* 3.3*   No results found for this basename: LIPASE:5,AMYLASE:5 in the last 168 hours No results found for this basename: AMMONIA:5 in the last 168 hours CBC:  Lab 10/11/12 0342 10/10/12 1456 10/10/12 0949  WBC 5.4 5.7 6.0  NEUTROABS -- -- --  HGB 10.7* 11.8* 11.5*  HCT 33.0* 35.9* 36.2  MCV 86.6 86.9 86.4  PLT 223 240 248   Cardiac Enzymes:  Lab 10/11/12 0342  10/10/12 2024 10/10/12 1457 10/10/12 0951  CKTOTAL -- -- -- --  CKMB -- -- -- --  CKMBINDEX -- -- -- --  TROPONINI <0.30 <0.30 <0.30 <0.30   BNP (last 3 results)  Basename 02/27/12 1251  PROBNP 101.0*   CBG: No results found for this basename: GLUCAP:5 in the last 168 hours  Recent Results (from the past 240 hour(s))  CULTURE, URINE COMPREHENSIVE     Status: Normal   Collection Time   10/07/12 12:13 PM      Component Value Range Status Comment   Colony Count 80,000 COLONIES/ML   Final    Organism ID, Bacteria Yeast   Final   URINE CULTURE     Status: Normal   Collection Time   10/10/12 11:31 AM      Component Value Range Status Comment   Specimen Description  URINE, CLEAN CATCH   Final    Special Requests ADDED ON 10/10/12 AT 1819   Final    Culture  Setup Time 10/11/2012 02:01   Final    Colony Count >=100,000 COLONIES/ML   Final    Culture     Final    Value: Multiple bacterial morphotypes present, none predominant. Suggest appropriate recollection if clinically indicated.   Report Status 10/12/2012 FINAL   Final      Studies: No results found.  Scheduled Meds:    . aspirin EC  81 mg Oral Daily  . bisacodyl  5 mg Oral Once  . cefTRIAXone (ROCEPHIN)  IV  1 g Intravenous Q24H  . docusate sodium  100 mg Oral BID  . feeding supplement  237 mL Oral BID BM  . feeding supplement  1 Container Oral BID BM  . fluconazole  100 mg Oral Daily  . heparin  5,000 Units Subcutaneous Q8H  . multivitamin with minerals  1 tablet Oral Daily  . polyethylene glycol  17 g Oral Daily  . potassium phosphate IVPB (mEq)  40 mEq Intravenous Once  . sodium chloride  3 mL Intravenous Q12H  . DISCONTD: acetaZOLAMIDE  250 mg Oral Daily  . DISCONTD: lisinopril  10 mg Oral Daily  . DISCONTD: potassium chloride  40 mEq Oral Once   Continuous Infusions:    . DISCONTD: sodium chloride 1,000 mL (10/11/12 0958)    Principal Problem:  *Dehydration Active Problems:  UTI (lower urinary tract  infection)  Candiduria  Weakness generalized  Bradycardia  RBBB  GERD (gastroesophageal reflux disease)  Constipation  HTN (hypertension)  Protein-calorie malnutrition, severe    Time spent: 30    Khamila Bassinger, [facility]  [facility] Pager [phone]. If 8PM-8AM, please contact night-coverage at [email or web address], password [email or web address] 10/12/2012, 4:48 PM  LOS: 2 days

## 2012-10-12 NOTE — Progress Notes (Signed)
Physical Therapy Treatment Note   10/12/12 1405  PT Visit Information  Last PT Received On 10/12/12  Assistance Needed +1  PT Time Calculation  PT Start Time 1405  PT Stop Time 1430  PT Time Calculation (min) 25 min  Subjective Data  Subjective Pt received on BSC.  Precautions  Precautions Fall  Restrictions  Weight Bearing Restrictions No  Cognition  Overall Cognitive Status Appears within functional limits for tasks assessed/performed  Arousal/Alertness Awake/alert  Orientation Level Oriented X4 / Intact  Behavior During Session [facility] for tasks performed  Bed Mobility  Bed Mobility Sit to Supine  Supine to Sit 5: Supervision;HOB elevated;With rails  Transfers  Transfers Sit to Stand;Stand to Sit  Sit to Stand From toilet;With armrests;From bed;With upper extremity assist;4: Min guard  Stand to Sit 4: Min guard (increased time required)  Ambulation/Gait  Ambulation/Gait Assistance 4: Min guard  Ambulation Distance (Feet) 150 Feet  Assistive device Rolling walker  Ambulation/Gait Assistance Details verbal cues to maintain upright posture, short shuffled gait pattern  Gait Pattern Step-through pattern;Decreased step length - right;Decreased step length - left;Decreased stride length;Trunk flexed  Gait velocity Slow gait speed  PT - End of Session  Equipment Utilized During Treatment Gait belt  Activity Tolerance Patient tolerated treatment well  Patient left in bed;with call bell/phone within reach  Nurse Communication Mobility status  PT - Assessment/Plan  Comments on Treatment Session Pt tolerating activity well however con't to require increased time with all mobility.  PT Plan Discharge plan remains appropriate  PT Frequency Min 3X/week  Follow Up Recommendations Home health PT;Supervision/Assistance - 24 hour  Equipment Recommended 3 in 1 bedside comode  Acute Rehab PT Goals  Potential to Achieve Goals Good  PT Goal: Supine/Side to Sit - Progress Progressing toward  goal  PT Goal: Sit to Supine/Side - Progress Progressing toward goal  PT Goal: Sit to Stand - Progress Progressing toward goal  PT Goal: Stand to Sit - Progress Progressing toward goal  PT Goal: Ambulate - Progress Progressing toward goal  PT General Charges  $$ ACUTE PT VISIT 1 Procedure  PT Treatments  $Gait Training 8-22 mins  $Therapeutic Activity 8-22 mins     Pain: pt denies  Lewis Shock, PT, DPT Pager #: [phone] Office #: [phone]

## 2012-10-12 NOTE — Progress Notes (Addendum)
INITIAL ADULT NUTRITION ASSESSMENT Date: 10/12/2012   Time: 9:08 AM  Reason for Assessment: MD Consult for Assessment  INTERVENTION: 1. Add Ensure Complete po BID, each supplement provides 350 kcal and 13 grams of protein. 2. Monitor magnesium, potassium, and phosphorus daily for at least 3 days, MD to replete as needed, as pt is at risk for refeeding syndrome given dx of severe malnutrition 3. Downgrade diet to Dysphagia 3 given pt's report of preferences of chopped meats 4. RD to continue to follow nutrition care plan  DOCUMENTATION CODES Per approved criteria  -Severe malnutrition in the context of chronic illness   ASSESSMENT: Female 78 y.o.  Dx: Dehydration  Hx:  Past Medical History  Diagnosis Date  . Allergy, unspecified not elsewhere classified   . Hypertension   . Palpitations   . Chronic venous insufficiency   . Hypercholesteremia   . GERD (gastroesophageal reflux disease)   . Diverticulosis of colon   . History of urinary tract infection   . Urinary frequency   . Urinary incontinence   . DJD (degenerative joint disease)   . Osteoporosis   . Senile dementia   . Neuropathy   . Anxiety   . History of shingles    Past Surgical History  Procedure Date  . Vesicovaginal fistula closure w/ tah   . Tonsillectomy   . Abdominal hysterectomy    Related Meds:     . aspirin EC  81 mg Oral Daily  . bisacodyl  5 mg Oral Once  . cefTRIAXone (ROCEPHIN)  IV  1 g Intravenous Q24H  . docusate sodium  100 mg Oral BID  . feeding supplement  1 Container Oral BID BM  . fluconazole  100 mg Oral Daily  . heparin  5,000 Units Subcutaneous Q8H  . multivitamin with minerals  1 tablet Oral Daily  . polyethylene glycol  17 g Oral Daily  . potassium phosphate IVPB (mEq)  40 mEq Intravenous Once  . sodium chloride  3 mL Intravenous Q12H  . DISCONTD: acetaZOLAMIDE  250 mg Oral Daily  . DISCONTD: lisinopril  10 mg Oral Daily  . DISCONTD: potassium chloride  40 mEq Oral Once    Ht: 5' (152.4 cm)  Wt: 104 lb 11.2 oz (47.492 kg)  Ideal Wt: 100 lb/45.6 kg % Ideal Wt: 104%  Wt Readings from Last 15 Encounters:  10/10/12 104 lb 11.2 oz (47.492 kg)  08/31/12 114 lb (51.71 kg)  08/25/12 114 lb 12.8 oz (52.073 kg)  07/23/12 117 lb 6.4 oz (53.252 kg)  06/23/12 115 lb 12.8 oz (52.527 kg)  05/08/12 118 lb 3.2 oz (53.615 kg)  03/25/12 121 lb 3.2 oz (54.976 kg)  02/27/12 120 lb 12.8 oz (54.795 kg)  12/18/11 115 lb 12.8 oz (52.527 kg)  10/03/11 122 lb (55.339 kg)  08/01/11 120 lb (54.432 kg)  06/27/11 123 lb (55.792 kg)  05/30/11 125 lb 9.6 oz (56.972 kg)  03/27/11 114 lb 6.4 oz (51.891 kg)  12/26/10 117 lb 6.1 oz (53.244 kg)  Usual Wt: 118 - 120 lb % Usual Wt: 87%; 13% wt loss x at least 3 months  Body mass index is 20.45 kg/(m^2). Pt's weight is WNL.  Food/Nutrition Related Hx: pt with poor PO intake x 8-10 months per patient  Labs:  CMP     Component Value Date/Time   NA 140 10/12/2012 0500   K 3.4* 10/12/2012 0500   CL 112 10/12/2012 0500   CO2 23 10/12/2012 0500   GLUCOSE 84 10/12/2012  0500   GLUCOSE 75 11/17/2006 1228   BUN 14 10/12/2012 0500   CREATININE 1.02 10/12/2012 0500   CALCIUM 10.1 10/12/2012 0500   CALCIUM 11.8* 06/23/2012 1606   PROT 5.5* 10/11/2012 0342   ALBUMIN 2.8* 10/11/2012 0342   AST 20 10/11/2012 0342   ALT 12 10/11/2012 0342   ALKPHOS 42 10/11/2012 0342   BILITOT 0.2* 10/11/2012 0342   GFRNONAA 44* 10/12/2012 0500   GFRAA 51* 10/12/2012 0500   Phosphorus  Date/Time Value Range Status  10/12/2012  5:00 AM 1.8* 2.3 - 4.6 mg/dL Final   Potassium  Date/Time Value Range Status  10/12/2012  5:00 AM 3.4* 3.5 - 5.1 mEq/L Final   Magnesium  Date/Time Value Range Status  10/10/2012  2:56 PM 2.1  1.5 - 2.5 mg/dL Final    Intake/Output Summary (Last 24 hours) at 10/12/12 1020 Last data filed at 10/11/12 1830  Gross per 24 hour  Intake    760 ml  Output      0 ml  Net    760 ml   Diet Order:  General  Supplements/Tube Feeding: Ensure Pudding PO BID  IVF:    DISCONTD: sodium chloride Last Rate: 1,000 mL (10/11/12 0958)   Estimated Nutritional Needs:   Kcal: 1100 - 1200 kcal Protein:  45 - 55 grams Fluid: at least 1.2 liters daily  Admitted with weakness, dehydration and UTI. Pt reports that she has had poor oral intake for 8-10 months, unable to state why. She says that she usually weighs 120lb, but weight continues to decrease because she is not eating enough.  States that she will drink Ensure daily at home. Has an aid to help her prepare meals, states that her children also help her prepare her meals/grocery shop.  States that she prefers to have foods chopped 2/2 some difficulty with chewing tough meats. RD observed breakfast tray, pt consumed 90%. She states that this is rare for her.  Pt with temporal wasting evident. No skin breakdown present.  Pt meets criteria for severe malnutrition in the context of chronic illness as evidenced by intake of < 75% of estimated energy intake x at least 1 month and 13% x 3 months.  Pt is at risk for refeeding syndrome given dx of severe malnutrition. Continue to monitor potassium, magnesium, and phosphorus given this risk. Noted current potassium and phosphorus is low.  NUTRITION DIAGNOSIS: Inadequate oral intake r/t poor appetite and advanced age AEB pt report and ongoing weight loss.  MONITORING/EVALUATION(Goals): Goal: Pt to meet >/= 90% of their estimated nutrition needs Monitor: weight trends, lab trends, I/O's, PO intake, supplement tolerance  EDUCATION NEEDS: -No education needs identified at this time  Jarold Motto MS, RD, LDN Pager: [phone] After-hours pager: [phone]

## 2012-10-12 NOTE — Progress Notes (Signed)
ANTIBIOTIC CONSULT NOTE - INITIAL  Pharmacy Consult for Cipro Indication: UTI  No Known Allergies  Patient Measurements: Height: 5' (152.4 cm) Weight: 104 lb 11.2 oz (47.492 kg) IBW/kg (Calculated) : 45.5  Adjusted Body Weight:    Vital Signs: Temp: 98.7 F (37.1 C) (10/28 1400) Temp src: Oral (10/28 1400) BP: 152/74 mmHg (10/28 1400) Pulse Rate: 43  (10/28 1400) Intake/Output from previous day: 10/27 0701 - 10/28 0700 In: 1043 [P.O.:1040; I.V.:3] Out: 300 [Urine:300] Intake/Output from this shift:    Labs:  Basename 10/12/12 0500 10/11/12 0342 10/10/12 1456 10/10/12 0949  WBC -- 5.4 5.7 6.0  HGB -- 10.7* 11.8* 11.5*  PLT -- 223 240 248  LABCREA -- -- -- --  CREATININE 1.02 1.09 1.18* --   Estimated Creatinine Clearance: 21.6 ml/min (by C-G formula based on Cr of 1.02). No results found for this basename: VANCOTROUGH:2,VANCOPEAK:2,VANCORANDOM:2,GENTTROUGH:2,GENTPEAK:2,GENTRANDOM:2,TOBRATROUGH:2,TOBRAPEAK:2,TOBRARND:2,AMIKACINPEAK:2,AMIKACINTROU:2,AMIKACIN:2, in the last 72 hours   Microbiology: Recent Results (from the past 720 hour(s))  CULTURE, URINE COMPREHENSIVE     Status: Normal   Collection Time   10/07/12 12:13 PM      Component Value Range Status Comment   Colony Count 80,000 COLONIES/ML   Final    Organism ID, Bacteria Yeast   Final   URINE CULTURE     Status: Normal   Collection Time   10/10/12 11:31 AM      Component Value Range Status Comment   Specimen Description URINE, CLEAN CATCH   Final    Special Requests ADDED ON 10/10/12 AT 1819   Final    Culture  Setup Time 10/11/2012 02:01   Final    Colony Count >=100,000 COLONIES/ML   Final    Culture     Final    Value: Multiple bacterial morphotypes present, none predominant. Suggest appropriate recollection if clinically indicated.   Report Status 10/12/2012 FINAL   Final     Medical History: Past Medical History  Diagnosis Date  . Allergy, unspecified not elsewhere classified   .  Hypertension   . Palpitations   . Chronic venous insufficiency   . Hypercholesteremia   . GERD (gastroesophageal reflux disease)   . Diverticulosis of colon   . History of urinary tract infection   . Urinary frequency   . Urinary incontinence   . DJD (degenerative joint disease)   . Osteoporosis   . Senile dementia   . Neuropathy   . Anxiety   . History of shingles     Assessment: 78 y/o female with recurrent UTIs possibly on the basis of a bladder diverticulum. U/A & Culture done on 10/07/12, which revealed a funguria (Diflucan #3). Patient afebrile with WBC 5.4. Urine cultures 10/26 revealed 10,000 colonies of multiple bacteria. Plan to change Rocephin to Cipro. Would recommend treating for 7-10 days for complicated and recurrent UTIs.  Plan:  Cipro 400mg  IV x 1 then 200mg  IV q24h. Dosage should not need further adjustment. Pharmacy will sign off.  Merilynn Finland, [employer] 10/12/2012,5:06 PM

## 2012-10-13 DIAGNOSIS — R609 Edema, unspecified: Secondary | ICD-10-CM | POA: Diagnosis not present

## 2012-10-13 DIAGNOSIS — B3749 Other urogenital candidiasis: Secondary | ICD-10-CM | POA: Diagnosis not present

## 2012-10-13 DIAGNOSIS — E860 Dehydration: Secondary | ICD-10-CM | POA: Diagnosis not present

## 2012-10-13 DIAGNOSIS — I498 Other specified cardiac arrhythmias: Secondary | ICD-10-CM | POA: Diagnosis not present

## 2012-10-13 DIAGNOSIS — I10 Essential (primary) hypertension: Secondary | ICD-10-CM

## 2012-10-13 LAB — BASIC METABOLIC PANEL
Chloride: 110 mEq/L (ref 96–112)
Creatinine, Ser: 1.05 mg/dL (ref 0.50–1.10)
GFR calc Af Amer: 49 mL/min — ABNORMAL LOW (ref >90)
GFR calc non Af Amer: 42 mL/min — ABNORMAL LOW (ref >90)

## 2012-10-13 LAB — URINE CULTURE: Culture: NO GROWTH

## 2012-10-13 LAB — PHOSPHORUS: Phosphorus: 2.2 mg/dL — ABNORMAL LOW (ref 2.3–4.6)

## 2012-10-13 MED ORDER — LISINOPRIL 5 MG PO TABS
5.0000 mg | ORAL_TABLET | Freq: Every day | ORAL | Status: DC
  Administered 2012-10-13 – 2012-10-15 (×3): 5 mg via ORAL
  Filled 2012-10-13 (×3): qty 1

## 2012-10-13 MED ORDER — POTASSIUM PHOSPHATE DIBASIC 3 MMOLE/ML IV SOLN
40.0000 meq | Freq: Once | INTRAVENOUS | Status: AC
  Administered 2012-10-13: 40 meq via INTRAVENOUS
  Filled 2012-10-13: qty 9.09

## 2012-10-13 NOTE — Progress Notes (Signed)
[facility] PROGRESS NOTE  Jessica [name] ZOX:[id number] DOB: 1911-07-13 DOA: 10/10/2012 PCP: [name], MD  Assessment/Plan:  Weakness generalized: Patient states that she has been weak for a few months, although this has recently worsened.  Given phosphorous and potassium levels, she likely has hypophosphatemic muscle weakness and refeeding syndrome.  Dehydration and possible UTI may have exacerbated her symptoms.  -  TSH wnl -  See below for nutrition and electrolyte management -  PT/OT recommending home health with 24 hour supervision, son lives with her and her husband  Severe protein calorie malnutrition with hypokalemia, hypophosphatemia.  At risk for hypomagnesemia. -  Appreciate nutrition recommendations -  Replete with KPhos IV again today -  Repeat BMP, Mg, Phos this evening and in AM -  Regular diet with ensure and MVI  Dehydration, resolved:  Patient states her weakness is marginally improved with hydration.  BUN and creatinine continuing to trend down.  -  Continue to hold torsemide  -  Continue to hold diamox  UTI (lower urinary tract infection)/Candiduria:  Patient has recurrent UTI with bladder diverticulum.  UTI may be contributing to fatigue so will consider UTI symptomatic, but would otherwise not treat candida in urine.  Received 3 days of fluconazole and culture was negative for yeast: Initial culture with mixed flora.   Repeat urinalysis has increasing leukocytes and  7-10 WBC but urine culture was negative.  May have had some ATN from dehydration.   Bradycardia/RBBB: Patient has long-standing brady cardia, with baseline HR in the 50s-60s, per EMR, and her 12-lead EKG of 09/13/09, reveals RBBB. Current EKG is unchanged.  HR in 40s today. -  Check heart rate with ambulation   GERD (gastroesophageal reflux disease): Asymptomatic.   Constipation:  Resolved -  Continue colace and miralax  HTN (hypertension): BP is elevated today -  Start lisinopril   Anemia: Patient has a mild normocytic anemia. In the setting of advanced age, weakness, poor appetite and constipation, the question of a possible GI malignancy has to be entertained. -  Iron panel not consistent with iron deficiency -  Folate wnl -  Vitamin B12 wnl -  Outpatient follow up  DIET:   Regular ACCESS:  PIV IVF:  OFF because large volume with Kphos infusion PROPH:  heparin  Code Status: DNR Family Communication: Spoke with patient today.   Jessica [name] is another main contact (Tel: [phone]).  Son is primary contact and care-giver, Jessica [name] (Tel: [phone]) Disposition Plan: Pending improvement in blood pressure, weakness, and rehydrated  HPI: This is a 78 year old female, with history of HTN, dyslipidemia, GERD/esophageal stricture, DJD, osteoporosis, urinary incontinence/bladder diverticulum, recurrent UTIs, neuropathy, anxiety, previous shingles, diverticulosis, dementia, chronic venous insufficiency, s/p vesicovaginal fistula closure/TAH, presenting with above symptoms. According to patient, she has been progressively weak for the past few months, but this has become worse in the last 2 weeks. Appetite is poor, but she has had no fever, chill, abdominal pain, vomiting or diarrhea. She has had no cough or shortness of breath. Patient was seen at her PMD's office on 10/07/12, by PA, and Norvasc was discontinued, for "over-compensated blood pressure". U/A and culture were also done at that time. Per ED MD, She was found to be bradycardic in the ED, with HR occasionally down to the 40s, although she has no complaint of dizziness, chest pain or SOB.    Consultants:  Nutrition  Procedures:  None  Antibiotics:  Ceftriaxone 10/26>> 10/27  Fluconazole 10/26>> day 3  HPI/Subjective:  Patient states that she continues to feel weak.  She ambulated a Joriel Streety ways in the hall with some difficulty.  Denies chest pain, shortness fo breath, nausea, vomiting,  diarrhea.  She had a BM.   Objective: Filed Vitals:   10/12/12 0500 10/12/12 1400 10/12/12 2100 10/13/12 0500  BP: 128/61 152/74 131/56 159/72  Pulse: 45 43 51 51  Temp: 98.4 F (36.9 C) 98.7 F (37.1 C) 98.3 F (36.8 C) 98.8 F (37.1 C)  TempSrc: Oral Oral Oral Oral  Resp: 18 18 16 16   Height:      Weight:      SpO2: 98% 99% 97% 96%   No intake or output data in the 24 hours ending 10/13/12 0922 Filed Weights   10/10/12 0910 10/10/12 1800  Weight: 46.72 kg (103 lb) 47.492 kg (104 lb 11.2 oz)    Exam:   General:  Cachectic AAF, no acute distress sitting on bed  HEENT:  MMM  Skin:  Skin tenting resolving  Cardiovascular: Bradycardic, 3/6 murmur across precordium  Respiratory: CTAB  Abdomen: NABS, soft, nondistended, nontender  MSK:  1+ LEE  Data Reviewed: Basic Metabolic Panel:  Lab 10/13/12 1610 10/12/12 1819 10/12/12 0500 10/11/12 0342 10/10/12 1456 10/10/12 0949  NA 141 139 140 137 -- 139  K 3.5 3.6 3.4* 3.5 -- 3.8  CL 110 106 112 105 -- 106  CO2 23 25 23 24  -- 29  GLUCOSE 95 99 84 89 -- 95  BUN 12 14 14 18  -- 24*  CREATININE 1.05 1.13* 1.02 1.09 1.18* --  CALCIUM 10.0 10.4 10.1 10.9* -- 11.9*  MG 1.9 2.0 -- -- 2.1 2.2  PHOS 2.2* 3.9 1.8* -- -- --   Liver Function Tests:  Lab 10/11/12 0342 10/10/12 0949  AST 20 21  ALT 12 14  ALKPHOS 42 47  BILITOT 0.2* 0.3  PROT 5.5* 6.2  ALBUMIN 2.8* 3.3*   No results found for this basename: LIPASE:5,AMYLASE:5 in the last 168 hours No results found for this basename: AMMONIA:5 in the last 168 hours CBC:  Lab 10/11/12 0342 10/10/12 1456 10/10/12 0949  WBC 5.4 5.7 6.0  NEUTROABS -- -- --  HGB 10.7* 11.8* 11.5*  HCT 33.0* 35.9* 36.2  MCV 86.6 86.9 86.4  PLT 223 240 248   Cardiac Enzymes:  Lab 10/11/12 0342 10/10/12 2024 10/10/12 1457 10/10/12 0951  CKTOTAL -- -- -- --  CKMB -- -- -- --  CKMBINDEX -- -- -- --  TROPONINI <0.30 <0.30 <0.30 <0.30   BNP (last 3 results)  Basename 02/27/12 1251    PROBNP 101.0*   CBG: No results found for this basename: GLUCAP:5 in the last 168 hours  Recent Results (from the past 240 hour(s))  CULTURE, URINE COMPREHENSIVE     Status: Normal   Collection Time   10/07/12 12:13 PM      Component Value Range Status Comment   Colony Count 80,000 COLONIES/ML   Final    Organism ID, Bacteria Yeast   Final   URINE CULTURE     Status: Normal   Collection Time   10/10/12 11:31 AM      Component Value Range Status Comment   Specimen Description URINE, CLEAN CATCH   Final    Special Requests ADDED ON 10/10/12 AT 1819   Final    Culture  Setup Time 10/11/2012 02:01   Final    Colony Count >=100,000 COLONIES/ML   Final    Culture  Final    Value: Multiple bacterial morphotypes present, none predominant. Suggest appropriate recollection if clinically indicated.   Report Status 10/12/2012 FINAL   Final      Studies: No results found.  Scheduled Meds:    . aspirin EC  81 mg Oral Daily  . bisacodyl  5 mg Oral Once  . cefTRIAXone (ROCEPHIN)  IV  1 g Intravenous Q24H  . ciprofloxacin  200 mg Intravenous Q24H  . ciprofloxacin  400 mg Intravenous Once  . docusate sodium  100 mg Oral BID  . feeding supplement  237 mL Oral BID BM  . feeding supplement  1 Container Oral BID BM  . fluconazole  100 mg Oral Daily  . heparin  5,000 Units Subcutaneous Q8H  . multivitamin with minerals  1 tablet Oral Daily  . polyethylene glycol  17 g Oral Daily  . potassium phosphate IVPB (mEq)  40 mEq Intravenous Once  . potassium phosphate IVPB (mEq)  40 mEq Intravenous Once  . sodium chloride  3 mL Intravenous Q12H   Continuous Infusions:    Principal Problem:  *Dehydration Active Problems:  UTI (lower urinary tract infection)  Candiduria  Weakness generalized  Bradycardia  RBBB  GERD (gastroesophageal reflux disease)  Constipation  HTN (hypertension)  Protein-calorie malnutrition, severe  Hypophosphatemia    Time spent: 30    Lorma Heater,  [facility]  [facility] Pager [phone]. If 8PM-8AM, please contact night-coverage at [email or web address], password [facility] 10/13/2012, 9:22 AM  LOS: 3 days

## 2012-10-13 NOTE — Progress Notes (Signed)
Physical Therapy Treatment Note   10/13/12 1532  PT Visit Information  Last PT Received On 10/13/12  Assistance Needed +1  PT Time Calculation  PT Start Time 1532  PT Stop Time 1548  PT Time Calculation (min) 16 min  Subjective Data  Subjective Pt received supine in bed agreeable to ambulation  Precautions  Precautions Fall  Restrictions  Weight Bearing Restrictions No  Cognition  Overall Cognitive Status Appears within functional limits for tasks assessed/performed  Arousal/Alertness Awake/alert  Orientation Level Oriented X4 / Intact  Behavior During Session [facility] for tasks performed  Bed Mobility  Bed Mobility Sit to Supine  Supine to Sit 4: Min guard;With rails;HOB flat  Transfers  Transfers Sit to Stand;Stand to Sit  Sit to Stand 4: Min guard;With upper extremity assist;From bed  Stand to Sit 4: Min guard  Details for Transfer Assistance Verbal cues for hand placement.  Ambulation/Gait  Ambulation/Gait Assistance 4: Min guard  Ambulation Distance (Feet) 150 Feet  Assistive device Rolling walker  Gait Pattern Step-through pattern;Decreased step length - right;Decreased step length - left;Decreased stride length;Trunk flexed  Gait velocity Slow gait speed  PT - End of Session  Equipment Utilized During Treatment Gait belt  Activity Tolerance Patient tolerated treatment well  Patient left in bed;with call bell/phone within reach  Nurse Communication Mobility status  PT - Assessment/Plan  Comments on Treatment Session Pt con't to progress however remains to require 24/7 supervision upon d/c due to increase fall risk and elderly husband who also requires assist.  PT Plan Discharge plan remains appropriate  PT Frequency Min 3X/week  Follow Up Recommendations Home health PT;Supervision/Assistance - 24 hour  Acute Rehab PT Goals  PT Goal Formulation With patient  Time For Goal Achievement 10/20/12  Potential to Achieve Goals Good  PT Goal: Supine/Side to Sit - Progress  Progressing toward goal  PT Goal: Sit to Supine/Side - Progress Progressing toward goal  PT Goal: Sit to Stand - Progress Progressing toward goal  PT Goal: Stand to Sit - Progress Progressing toward goal  PT Goal: Ambulate - Progress Progressing toward goal  PT General Charges  $$ ACUTE PT VISIT 1 Procedure  PT Treatments  $Gait Training 8-22 mins     Lewis Shock, PT, DPT Pager #: [phone] Office #: [phone]

## 2012-10-14 DIAGNOSIS — E860 Dehydration: Secondary | ICD-10-CM | POA: Diagnosis not present

## 2012-10-14 LAB — BASIC METABOLIC PANEL
BUN: 10 mg/dL (ref 6–23)
Calcium: 9.7 mg/dL (ref 8.4–10.5)
Creatinine, Ser: 0.95 mg/dL (ref 0.50–1.10)
GFR calc non Af Amer: 48 mL/min — ABNORMAL LOW (ref >90)
Glucose, Bld: 92 mg/dL (ref 70–99)
Sodium: 136 mEq/L (ref 135–145)

## 2012-10-14 MED ORDER — K PHOS MONO-SOD PHOS DI & MONO 155-852-130 MG PO TABS
500.0000 mg | ORAL_TABLET | Freq: Two times a day (BID) | ORAL | Status: DC
  Administered 2012-10-14 – 2012-10-15 (×2): 500 mg via ORAL
  Filled 2012-10-14 (×3): qty 2

## 2012-10-14 NOTE — Progress Notes (Signed)
   CARE MANAGEMENT NOTE 10/14/2012  Patient:  Jessica [name], Jessica [name]   Account Number:  [id number]  Date Initiated:  10/14/2012  Documentation initiated by:  GRAVES-BIGELOW,Raife Lizer  Subjective/Objective Assessment:   Pt admitted with generalized weakness. Pt is from home with husband and has a caregiver there for 5 days wk from 9a-1p and then the son comes in around 4-5:30 and provides care to the next am.     Action/Plan:   CM did call Aram Beecham daughter to discuss 24 hr supervision and she has to speak to her brother. She wanted information on SNF and CM did contact CSW [place]. Pt is currently active with [facility] for RN services. Pt will need HHPT/OT.   Anticipated DC Date:  10/15/2012   Anticipated DC Plan:  HOME W HOME HEALTH SERVICES      DC Planning Services  CM consult      Choice offered to / List presented to:             Status of service:  In process, will continue to follow Medicare Important Message given?   (If response is "NO", the following Medicare IM given date fields will be blank) Date Medicare IM given:   Date Additional Medicare IM given:    Discharge Disposition:    Per UR Regulation:  Reviewed for med. necessity/level of care/duration of stay  If discussed at Long Length of Stay Meetings, dates discussed:    Comments:

## 2012-10-14 NOTE — Clinical Social Work Placement (Signed)
     Clinical Social Work Department CLINICAL SOCIAL WORK PLACEMENT NOTE 10/14/2012  Patient:  Jessica [name], Jessica [name]  Account Number:  [id number] Admit date:  10/10/2012  Clinical Social Worker:  Margaree Mackintosh  Date/time:  10/14/2012 12:00 M  Clinical Social Work is seeking post-discharge placement for this patient at the following level of care:   SKILLED NURSING   (*CSW will update this form in Epic as items are completed)   10/14/2012  Patient/family provided with [facility] Department of Clinical Social Works list of facilities offering this level of care within the geographic area requested by the patient (or if unable, by the patients family).  10/14/2012  Patient/family informed of their freedom to choose among providers that offer the needed level of care, that participate in Medicare, Medicaid or managed care program needed by the patient, have an available bed and are willing to accept the patient.  10/14/2012  Patient/family informed of [facility] ownership interest in [facility], as well as of the fact that they are under no obligation to receive care at this facility.  PASARR submitted to EDS on 10/14/2012 PASARR number received from EDS on 10/14/2012  FL2 transmitted to all facilities in geographic area requested by pt/family on  10/14/2012 FL2 transmitted to all facilities within larger geographic area on   Patient informed that his/her managed care company has contracts with or will negotiate with  certain facilities, including the following:     Patient/family informed of bed offers received:  10/14/2012 Patient [facility] bed at [facility] Physician recommends and patient chooses bed at  [facility]  Patient to be transferred to [facility] on  10/15/2012 Patient to be transferred to facility by   The following physician request were entered in Epic:   Additional Comments:

## 2012-10-14 NOTE — Progress Notes (Signed)
[facility] PROGRESS NOTE  Jessica [name] ZOX:[id number] DOB: 1911-10-20 DOA: 10/10/2012 PCP: [name], MD  Assessment/Plan:  Weakness generalized: Patient states that she has been weak for a few months, although this has recently worsened.  Given phosphorous and potassium levels, she likely has hypophosphatemic muscle weakness and refeeding syndrome.  Dehydration and possible UTI may have exacerbated her symptoms.  -  TSH wnl -  See below for nutrition and electrolyte management -  PT/OT recommending home health with 24 hour supervision  Severe protein calorie malnutrition with hypokalemia, hypophosphatemia.  At risk for hypomagnesemia. -  Appreciate nutrition recommendations -  Repleted with KPhos IV 56mEQ-changed on 10/14/2012 2 Neutra-Phos orally -  Repeat BMP, Mg, Phos this evening and in AM -  Liberalize diet -  Add ensure between meals -  MVI  Dehydration, resolving:  Patient states her weakness is marginally improved with hydration.  BUN and creatinine continuing to trend down.   -  Continue to hold torsemide  -  Continue to hold diamox -  Continue IVF -  Monitor for pulmonary edema and anasarca given hypoalbuminemia  UTI (lower urinary tract infection)/Candiduria:  Patient has recurrent UTI with bladder diverticulum.  UTI may be contributing to fatigue so will consider UTI symptomatic, but would otherwise not treat candida in urine.  Initial culture with mixed flora.   Repeat urinalysis has increasing leukocytes and  7-10 WBC despite ceftriaxone.   -  F/u repeat urine culture -  Continue fluconazole, day 3, d/c 10/28 -  DC rocephin, start ciprofloxacin--all antibiotics discontinued 10/29  Bradycardia/RBBB: Patient has long-standing brady cardia, with baseline HR in the 50s-60s, per EMR, and her 12-lead EKG of 09/13/09, reveals RBBB. Current EKG is unchanged.  HR in 40s today. -  Check heart rate with ambulation   GERD (gastroesophageal reflux disease):  Asymptomatic.   Constipation:  Resolved -  Continue colace and miralax  HTN (hypertension): BP is low normal today.   -  Hold diamox and lisinopril   Anemia: Patient has a mild normocytic anemia. In the setting of advanced age, weakness, poor appetite and constipation, the question of a possible GI malignancy has to be entertained. -  Iron panel not consistent with iron deficiency -  Folate wnl -  Vitamin B12 wnl -  Outpatient follow up  DIET:   Regular ACCESS:  PIV IVF:  OFF because large volume with Kphos infusion PROPH:  heparin  Code Status: DNR Family Communication: Spoke with patient, patient's husband today at bedside.  Left message for Sallyanne Kuster in case she would like family updates.   Sallyanne Kuster is another main contact (Tel: [phone]).  son and care-giver, Aleiah Mohammed (Tel: [phone]) Disposition Plan: Pending improvement in blood pressure, weakness, and rehydrated  HPI: This is a 78 year old female, with history of HTN, dyslipidemia, GERD/esophageal stricture, DJD, osteoporosis, urinary incontinence/bladder diverticulum, recurrent UTIs, neuropathy, anxiety, previous shingles, diverticulosis, dementia, chronic venous insufficiency, s/p vesicovaginal fistula closure/TAH, presenting with above symptoms. According to patient, she has been progressively weak for the past few months, but this has become worse in the last 2 weeks. Appetite is poor, but she has had no fever, chill, abdominal pain, vomiting or diarrhea. She has had no cough or shortness of breath. Patient was seen at her PMD's office on 10/07/12, by PA, and Norvasc was discontinued, for "over-compensated blood pressure". U/A and culture were also done at that time. Per ED MD, She was found to be bradycardic in the ED, with HR  occasionally down to the 40s, although she has no complaint of dizziness, chest pain or SOB.     Consultants:  Nutrition  Procedures:  None  Antibiotics:  Ceftriaxone 10/26>> 10/27  Fluconazole 10/26>> d/c 10/28  HPI/Subjective:  Patient states that she continues to feel weak.  S states she has some lower extremity pain and tingling. She she has swelling of her ankles. She is eating dinner bedside and seems comfortable.  Objective: Filed Vitals:   10/13/12 2100 10/14/12 0500 10/14/12 1049 10/14/12 1413  BP: 161/66 148/73 143/58 132/64  Pulse: 51 49  55  Temp: 98.4 F (36.9 C) 98.6 F (37 C)  99 F (37.2 C)  TempSrc: Oral Oral  Oral  Resp: 18 16  18   Height:      Weight:      SpO2: 98% 97%  100%   No intake or output data in the 24 hours ending 10/14/12 2006 Filed Weights   10/10/12 0910 10/10/12 1800  Weight: 46.72 kg (103 lb) 47.492 kg (104 lb 11.2 oz)    Exam:   General:  Cachectic AAF, no acute distress sitting on bed  HEENT:  MMM  Skin:  Skin tenting resolving  Cardiovascular: Bradycardic, 3/6 murmur across precordium  Respiratory: CTAB  Abdomen: NABS, soft, nondistended, nontender  MSK:  No LEE  Data Reviewed: Basic Metabolic Panel:  Lab 10/14/12 1610 10/13/12 0530 10/12/12 1819 10/12/12 0500 10/11/12 0342 10/10/12 1456 10/10/12 0949  NA 136 141 139 140 137 -- --  K 3.4* 3.5 3.6 3.4* 3.5 -- --  CL 105 110 106 112 105 -- --  CO2 25 23 25 23 24  -- --  GLUCOSE 92 95 99 84 89 -- --  BUN 10 12 14 14 18  -- --  CREATININE 0.95 1.05 1.13* 1.02 1.09 -- --  CALCIUM 9.7 10.0 10.4 10.1 10.9* -- --  MG 1.9 1.9 2.0 -- -- 2.1 2.2  PHOS 2.2* 2.2* 3.9 1.8* -- -- --   Liver Function Tests:  Lab 10/11/12 0342 10/10/12 0949  AST 20 21  ALT 12 14  ALKPHOS 42 47  BILITOT 0.2* 0.3  PROT 5.5* 6.2  ALBUMIN 2.8* 3.3*   No results found for this basename: LIPASE:5,AMYLASE:5 in the last 168 hours No results found for this basename: AMMONIA:5 in the last 168 hours CBC:  Lab 10/11/12 0342 10/10/12 1456 10/10/12 0949  WBC 5.4 5.7 6.0  NEUTROABS  -- -- --  HGB 10.7* 11.8* 11.5*  HCT 33.0* 35.9* 36.2  MCV 86.6 86.9 86.4  PLT 223 240 248   Cardiac Enzymes:  Lab 10/11/12 0342 10/10/12 2024 10/10/12 1457 10/10/12 0951  CKTOTAL -- -- -- --  CKMB -- -- -- --  CKMBINDEX -- -- -- --  TROPONINI <0.30 <0.30 <0.30 <0.30   BNP (last 3 results)  Basename 02/27/12 1251  PROBNP 101.0*   CBG: No results found for this basename: GLUCAP:5 in the last 168 hours  Recent Results (from the past 240 hour(s))  CULTURE, URINE COMPREHENSIVE     Status: Normal   Collection Time   10/07/12 12:13 PM      Component Value Range Status Comment   Colony Count 80,000 COLONIES/ML   Final    Organism ID, Bacteria Yeast   Final   URINE CULTURE     Status: Normal   Collection Time   10/10/12 11:31 AM      Component Value Range Status Comment   Specimen Description URINE, CLEAN CATCH  Final    Special Requests ADDED ON 10/10/12 AT 1819   Final    Culture  Setup Time 10/11/2012 02:01   Final    Colony Count >=100,000 COLONIES/ML   Final    Culture     Final    Value: Multiple bacterial morphotypes present, none predominant. Suggest appropriate recollection if clinically indicated.   Report Status 10/12/2012 FINAL   Final   URINE CULTURE     Status: Normal   Collection Time   10/12/12  2:33 PM      Component Value Range Status Comment   Specimen Description URINE, RANDOM   Final    Special Requests NONE   Final    Culture  Setup Time 10/12/2012 15:18   Final    Colony Count NO GROWTH   Final    Culture NO GROWTH   Final    Report Status 10/13/2012 FINAL   Final      Studies: No results found.  Scheduled Meds:    . aspirin EC  81 mg Oral Daily  . bisacodyl  5 mg Oral Once  . docusate sodium  100 mg Oral BID  . feeding supplement  237 mL Oral BID BM  . feeding supplement  1 Container Oral BID BM  . heparin  5,000 Units Subcutaneous Q8H  . lisinopril  5 mg Oral Daily  . multivitamin with minerals  1 tablet Oral Daily  . polyethylene  glycol  17 g Oral Daily  . sodium chloride  3 mL Intravenous Q12H  . DISCONTD: ciprofloxacin  200 mg Intravenous Q24H   Continuous Infusions:    Principal Problem:  *Dehydration Active Problems:  UTI (lower urinary tract infection)  Candiduria  Weakness generalized  Bradycardia  RBBB  GERD (gastroesophageal reflux disease)  Constipation  HTN (hypertension)  Protein-calorie malnutrition, severe  Hypophosphatemia    Time spent: 30    Mahala Menghini [facility]  [facility] Pager [phone]. If 8PM-8AM, please contact night-coverage at [email or web address], password [facility] 10/14/2012, 8:06 PM  LOS: 4 days

## 2012-10-14 NOTE — Progress Notes (Signed)
Clinical Social Worker spoke with pt's dtr, Aram Beecham.  CSW explained [facility] able to [facility] bed.  CSW addressed dtr's questions/concerns.  Dtr requesting 1pm dc, if possible, as she works third shift, MD notified. CSW to continue to follow and assist as needed.    Angelia Mould, MSW, [place] [phone]

## 2012-10-14 NOTE — Clinical Social Work Psychosocial (Signed)
     Clinical Social Work Department BRIEF PSYCHOSOCIAL ASSESSMENT 10/14/2012  Patient:  Jessica [name], Jessica [name]     Account Number:  [id number]     Admit date:  10/10/2012  Clinical Social Worker:  Margaree Mackintosh  Date/Time:  10/14/2012 12:00 M  Referred by:  Care Management  Date Referred:  10/14/2012 Referred for  SNF Placement  SNF Placement   Other Referral:   Interview type:  Family Other interview type:   patient    PSYCHOSOCIAL DATA Living Status:  FAMILY Admitted from facility:   Level of care:   Primary support name:  Aram Beecham: [phone] Primary support relationship to patient:  CHILD, ADULT Degree of support available:   Adequate.    CURRENT CONCERNS Current Concerns  Post-Acute Placement   Other Concerns:    SOCIAL WORK ASSESSMENT / PLAN Clinical Social Worker recieved referral from [facility] indicating pt's dtr currently having doubts that she will be able to offer 24 hour care to pt.  CSW reviewed chart and met with pt.  CSW introduced self, explained role, and provided support.  CSW reviewed dtr's questions; pt agreeable to SNF at [facility].  CSW reviewed SNF process and submitted paperwork.  [facility] able to offer a bed. CSW to continue to follow and assist as needed.   Assessment/plan status:  Information/Referral to [facility] Other assessment/ plan:   Information/referral to community resources:   [employer].    PATIENTS/FAMILYS RESPONSE TO PLAN OF CARE: Pt and dtr were both pleasant and engaged in conversation. Pt and dtr thanked CSW for intervention.

## 2012-10-14 NOTE — Progress Notes (Signed)
Occupational Therapy Treatment Patient Details Name: Mailey Landstrom MRN: [id number] DOB: [birth year] Today's Date: 10/14/2012 Time: 2952-8413 OT Time Calculation (min): 23 min  OT Assessment / Plan / Recommendation Comments on Treatment Session Pt progressing with therapy, although more fatigued today. Pt is anxious to return home. Will continue to follow pt acutely    Follow Up Recommendations  Home health OT;Supervision/Assistance - 24 hour    Barriers to Discharge       Equipment Recommendations  3 in 1 bedside comode    Recommendations for Other Services    Frequency     Plan Discharge plan remains appropriate    Precautions / Restrictions Precautions Precautions: Fall Restrictions Weight Bearing Restrictions: No   Pertinent Vitals/Pain Pt reports pain in her lower back but declines any pain medication. Pt repositioned at end of session for pain relief.    ADL  Eating/Feeding: Performed;Independent Where Assessed - Eating/Feeding: Edge of bed Grooming: Performed;Supervision/safety Where Assessed - Grooming: Unsupported standing Lower Body Dressing: Performed;Min guard Where Assessed - Lower Body Dressing: Unsupported sitting (doff socks) Toilet Transfer: Performed;Min guard Toilet Transfer Method: Sit to Barista: Bedside commode Toileting - Clothing Manipulation and Hygiene: Performed;Min guard (incr time for thoroughness- pt with some diff reaching) Where Assessed - Toileting Clothing Manipulation and Hygiene: Standing    OT Diagnosis:    OT Problem List:   OT Treatment Interventions:     OT Goals ADL Goals ADL Goal: Grooming - Progress: Progressing toward goals ADL Goal: Lower Body Dressing - Progress: Progressing toward goals ADL Goal: Toilet Transfer - Progress: Progressing toward goals ADL Goal: Toileting - Clothing Manipulation - Progress: Progressing toward goals ADL Goal: Toileting - Hygiene - Progress: Progressing toward  goals  Visit Information  Last OT Received On: 10/14/12 Assistance Needed: +1    Subjective Data      Prior Functioning       Cognition  Overall Cognitive Status: Appears within functional limits for tasks assessed/performed Arousal/Alertness: Awake/alert Orientation Level: Oriented X4 / Intact Behavior During Session: [facility] for tasks performed    Mobility  Shoulder Instructions Bed Mobility Supine to Sit: 5: Supervision;With rails Sitting - Scoot to Edge of Bed: 6: Modified independent (Device/Increase time);With rail Sit to Supine: 5: Supervision;HOB flat Details for Bed Mobility Assistance: supervision for safety Transfers Sit to Stand: 4: Min guard;With armrests;From toilet;From bed Stand to Sit: 4: Min guard;To toilet;To bed Details for Transfer Assistance: pt requires Min A for sit to stand from very low surfaces; all other surfaces, she is Min guard A       Exercises      Balance     End of Session OT - End of Session Equipment Utilized During Treatment: Gait belt Activity Tolerance: Patient limited by fatigue Patient left: in bed;with call bell/phone within reach;with bed alarm set  GO     Anyeli Hockenbury 10/14/2012, 8:49 AM

## 2012-10-15 DIAGNOSIS — I10 Essential (primary) hypertension: Secondary | ICD-10-CM | POA: Diagnosis not present

## 2012-10-15 DIAGNOSIS — K219 Gastro-esophageal reflux disease without esophagitis: Secondary | ICD-10-CM | POA: Diagnosis not present

## 2012-10-15 DIAGNOSIS — D638 Anemia in other chronic diseases classified elsewhere: Secondary | ICD-10-CM | POA: Diagnosis not present

## 2012-10-15 DIAGNOSIS — Z5189 Encounter for other specified aftercare: Secondary | ICD-10-CM | POA: Diagnosis not present

## 2012-10-15 DIAGNOSIS — E785 Hyperlipidemia, unspecified: Secondary | ICD-10-CM | POA: Diagnosis not present

## 2012-10-15 DIAGNOSIS — M6281 Muscle weakness (generalized): Secondary | ICD-10-CM | POA: Diagnosis not present

## 2012-10-15 DIAGNOSIS — R269 Unspecified abnormalities of gait and mobility: Secondary | ICD-10-CM | POA: Diagnosis not present

## 2012-10-15 DIAGNOSIS — F0390 Unspecified dementia without behavioral disturbance: Secondary | ICD-10-CM | POA: Diagnosis not present

## 2012-10-15 DIAGNOSIS — E876 Hypokalemia: Secondary | ICD-10-CM | POA: Diagnosis not present

## 2012-10-15 DIAGNOSIS — R609 Edema, unspecified: Secondary | ICD-10-CM | POA: Diagnosis not present

## 2012-10-15 DIAGNOSIS — E440 Moderate protein-calorie malnutrition: Secondary | ICD-10-CM | POA: Diagnosis not present

## 2012-10-15 DIAGNOSIS — E7800 Pure hypercholesterolemia, unspecified: Secondary | ICD-10-CM | POA: Diagnosis not present

## 2012-10-15 DIAGNOSIS — N390 Urinary tract infection, site not specified: Secondary | ICD-10-CM | POA: Diagnosis not present

## 2012-10-15 DIAGNOSIS — I872 Venous insufficiency (chronic) (peripheral): Secondary | ICD-10-CM | POA: Diagnosis not present

## 2012-10-15 DIAGNOSIS — E860 Dehydration: Secondary | ICD-10-CM | POA: Diagnosis not present

## 2012-10-15 DIAGNOSIS — R32 Unspecified urinary incontinence: Secondary | ICD-10-CM | POA: Diagnosis not present

## 2012-10-15 DIAGNOSIS — I4510 Unspecified right bundle-branch block: Secondary | ICD-10-CM | POA: Diagnosis not present

## 2012-10-15 DIAGNOSIS — K5730 Diverticulosis of large intestine without perforation or abscess without bleeding: Secondary | ICD-10-CM | POA: Diagnosis not present

## 2012-10-15 DIAGNOSIS — G609 Hereditary and idiopathic neuropathy, unspecified: Secondary | ICD-10-CM | POA: Diagnosis not present

## 2012-10-15 LAB — BASIC METABOLIC PANEL
CO2: 26 mEq/L (ref 19–32)
Calcium: 10.2 mg/dL (ref 8.4–10.5)
Creatinine, Ser: 0.86 mg/dL (ref 0.50–1.10)
Glucose, Bld: 94 mg/dL (ref 70–99)

## 2012-10-15 MED ORDER — K PHOS MONO-SOD PHOS DI & MONO 155-852-130 MG PO TABS: 2.0000 | ORAL_TABLET | Freq: Two times a day (BID) | ORAL | Status: DC

## 2012-10-15 MED ORDER — TORSEMIDE 20 MG PO TABS: 10.0000 mg | ORAL_TABLET | ORAL | Status: DC

## 2012-10-15 MED ORDER — LISINOPRIL 5 MG PO TABS: 5.0000 mg | ORAL_TABLET | Freq: Every day | ORAL | Status: DC

## 2012-10-15 MED ORDER — ENSURE COMPLETE PO LIQD: 237.0000 mL | Freq: Two times a day (BID) | ORAL | Status: DC

## 2012-10-15 NOTE — Discharge Summary (Signed)
Physician Discharge Summary  Jessica [name] BJY:[id number] DOB: Jun 16, 1911 DOA: 10/10/2012  PCP: [name], MD  Admit date: 10/10/2012 Discharge date: 10/15/2012  Time spent: 45 minutes  Recommendations for Outpatient Follow-up:  1. Recommend recheck magnesium and phosphorus probably in 1-2 days 2. Recommend ABIs and potentially EMG studies-her findings of weakness in her lower extremities may fit with hypophosphatemic muscle weakness and refeeding syndrome but she does complain of weakness on ambulation I suspect there is a component of claudication as well 3. Recommend basic metabolic panel as an outpatient-torsemide Diamox and lisinopril were held during hospital because of low blood pressures-I placed her on torsemide q. other daily for lower extremity swelling.   Discharge Diagnoses:  Principal Problem:  *Dehydration Active Problems:  UTI (lower urinary tract infection)  Candiduria  Weakness generalized  Bradycardia  RBBB  GERD (gastroesophageal reflux disease)  Constipation  HTN (hypertension)  Protein-calorie malnutrition, severe  Hypophosphatemia   Discharge Condition: good  Diet recommendation: Regular diet-would not limit  Long discussion with family members today about course of care and likely causes of her generalized weakness being either electrolyte wise versus this being a functional issue in terms of intermittent claudication.  Filed Weights   10/10/12 0910 10/10/12 1800  Weight: 46.72 kg (103 lb) 47.492 kg (104 lb 11.2 oz)    History of present illness:  This is a 78 year old female, with history of HTN, dyslipidemia, GERD/esophageal stricture, DJD, osteoporosis, urinary incontinence/bladder diverticulum, recurrent UTIs, neuropathy, anxiety, previous shingles, diverticulosis, dementia, chronic venous insufficiency, s/p vesicovaginal fistula closure/TAH, presenting with above symptoms. According to patient, she has been progressively weak for the past few  months, but this has become worse in the last 2 weeks. Appetite is poor, but she has had no fever, chill, abdominal pain, vomiting or diarrhea. She has had no cough or shortness of breath. Patient was seen at her PMD's office on 10/07/12, by PA, and Norvasc was discontinued, for over-compensated blood pressure. U/A and culture were also done at that time. Per ED MD, She was found to be bradycardic in the ED, with HR occasionally down to the 40s, although she has no complaint of dizziness, chest pain or SOB.    Hospital Course:  Weakness generalized: Patient states that she has been weak for a few months, although this has recently worsened.  Given phosphorous and potassium levels, she likely has hypophosphatemic muscle weakness and refeeding syndrome.  the patient also complained of weakness in her lower extremities worsening with ambulation.   -  TSH wnl -  See below for nutrition and electrolyte management -  PT/OT recommending home health with 24 hour supervision -Recommend outpatient EMG studies and imaging of the lower back if thought indicated by nursing home physician. She is high functioning and should be return to her regular home soon from nursing facility  Severe protein calorie malnutrition with hypokalemia, hypophosphatemia.  At risk for hypomagnesemia. -  Appreciate nutrition recommendations -  Repleted with KPhos IV 8mEQ-changed on 10/14/2012 2 Neutra-Phos orally-see above recommendations -  Add ensure between meals -  MVI  Dehydration, resolving:  Patient states her weakness is marginally improved with hydration.  BUN and creatinine continuing to trend down.    -Torsemide restarted to other daily for lower extremity swelling   -  restarted her Diamox -  Continue IVF -  Monitor for pulmonary edema and anasarca given hypoalbuminemia  UTI (lower urinary tract infection)/Candiduria:  Patient has recurrent UTI with bladder diverticulum.  UTI may be  contributing to fatigue so will  consider UTI symptomatic, but would otherwise not treat candida in urine.  Initial culture with mixed flora.   Repeat urinalysis has increasing leukocytes and  7-10 WBC despite ceftriaxone.    -  F/u repeat urine culture-this was completely negative -  Continue fluconazole, day 3, d/c 10/28 -  DC rocephin, start ciprofloxacin--all antibiotics discontinued 10/29  Bradycardia/RBBB: Patient has long-standing brady cardia, with baseline HR in the 50s-60s, per EMR, and her 12-lead EKG of 09/13/09, reveals RBBB. Current EKG is unchanged.  HR in 40s today. -  Check heart rate with ambulation -she ablated well without issue  GERD (gastroesophageal reflux disease): Asymptomatic.   Constipation:  Resolved -  Continue colace and miralax-discontinued on discharge-patient recommended to eat high fiber. Nursing home physicians discretion to restart bowel regimen.  HTN (hypertension): BP was lownormal during hospitalization   -  Held diamox and lisinopril initially and lisinopril was restarted at a lower dose 5 mg, she was restarted on her Diamox. I have tapered her torsemide to q. Other daily    Anemia: Patient has a mild normocytic anemia. In the setting of advanced age, weakness, poor appetite and constipation, the question of a possible GI malignancy has to be entertained. -  Iron panel not consistent with iron deficiency -  Folate wnl -  Vitamin B12 wnl-consider Methyl malonic acid assay as she does have potential neuropathy versus claudication. -  Outpatient follow up  DIET:   Regular  PROPH:  heparin   Procedures:  See below (i.e. Studies not automatically included, echos, thoracentesis, etc; not x-rays)  Consultations:  Physical therapy occupational therapy  Nutritionist  Discharge Exam: Filed Vitals:   10/14/12 1049 10/14/12 1413 10/14/12 2200 10/15/12 0449  BP: 143/58 132/64 163/81 167/66  Pulse:  55 61 55  Temp:  99 F (37.2 C) 98.4 F (36.9 C) 98.3 F (36.8 C)  TempSrc:   Oral Oral Oral  Resp:  18 18 18   Height:      Weight:      SpO2:  100% 99% 96%   feeling well today. Much better than yesterday No chest pain or shortness breath no blurred vision or double vision  General: Alert pleasant oriented in no distress Cardiovascular: S1-S2 no murmur rub or gallop Respiratory: Clinically clear  Discharge Instructions  Discharge Orders    Future Appointments: Provider: Department: Dept Phone: Center:   10/27/2012 3:00 PM [name], MD [facility] [phone] None     Future Orders Please Complete By Expires   Diet - low sodium heart healthy      Increase activity slowly      Call MD for:  persistant nausea and vomiting      Call MD for:  severe uncontrolled pain      Call MD for:  difficulty breathing, headache or visual disturbances      Call MD for:  hives      Call MD for:  persistant dizziness or light-headedness      Call MD for:  extreme fatigue          Medication List     As of 10/15/2012 10:36 AM    TAKE these medications         acetaZOLAMIDE 250 MG tablet   Commonly known as: DIAMOX   Take 250 mg by mouth daily. At 4 pm      ALPRAZolam 0.25 MG tablet   Commonly known as: XANAX   Take 0.125-0.25 mg  by mouth 3 (three) times daily as needed. For nerves      amLODipine 5 MG tablet   Commonly known as: NORVASC   Take 2.5 mg by mouth every morning.      aspirin EC 81 MG tablet   Take 81 mg by mouth daily.      feeding supplement Liqd   Take 237 mLs by mouth 2 (two) times daily between meals.      lisinopril 5 MG tablet   Commonly known as: PRINIVIL,ZESTRIL   Take 1 tablet (5 mg total) by mouth daily.      multivitamin tablet   Take 1 tablet by mouth daily.      phosphorus 155-852-130 MG tablet   Commonly known as: K PHOS NEUTRAL   Take 2 tablets by mouth 2 (two) times daily.      potassium chloride 10 MEQ tablet   Commonly known as: K-DUR   Take 10-20 mEq by mouth 2 (two) times daily. in the morning  and at night.      torsemide 20 MG tablet   Commonly known as: DEMADEX   Take 0.5 tablets (10 mg total) by mouth every other day. Take 1 tablet by mouth every morning          The results of significant diagnostics from this hospitalization (including imaging, microbiology, ancillary and laboratory) are listed below for reference.    Significant Diagnostic Studies: Dg Chest 2 View  10/10/2012  *RADIOLOGY REPORT*  Clinical Data: Chest discomfort  CHEST - 2 VIEW  Comparison: 05/08/2012  Findings: Thoracic kyphoscoliosis.  Diffuse osteopenia.  Lungs are clear, mildly hyperinflated.  Heart size within normal limits.  No effusion.  IMPRESSION:  No acute disease   Original Report Authenticated By: Osa Craver, M.D.     Microbiology: Recent Results (from the past 240 hour(s))  CULTURE, URINE COMPREHENSIVE     Status: Normal   Collection Time   10/07/12 12:13 PM      Component Value Range Status Comment   Colony Count 80,000 COLONIES/ML   Final    Organism ID, Bacteria Yeast   Final   URINE CULTURE     Status: Normal   Collection Time   10/10/12 11:31 AM      Component Value Range Status Comment   Specimen Description URINE, CLEAN CATCH   Final    Special Requests ADDED ON 10/10/12 AT 1819   Final    Culture  Setup Time 10/11/2012 02:01   Final    Colony Count >=100,000 COLONIES/ML   Final    Culture     Final    Value: Multiple bacterial morphotypes present, none predominant. Suggest appropriate recollection if clinically indicated.   Report Status 10/12/2012 FINAL   Final   URINE CULTURE     Status: Normal   Collection Time   10/12/12  2:33 PM      Component Value Range Status Comment   Specimen Description URINE, RANDOM   Final    Special Requests NONE   Final    Culture  Setup Time 10/12/2012 15:18   Final    Colony Count NO GROWTH   Final    Culture NO GROWTH   Final    Report Status 10/13/2012 FINAL   Final      Labs: Basic Metabolic Panel:  Lab  10/15/12 0620 10/14/12 0555 10/13/12 0530 10/12/12 1819 10/12/12 0500 10/10/12 1456  NA 142 136 141 139 140 --  K 3.7  3.4* 3.5 3.6 3.4* --  CL 108 105 110 106 112 --  CO2 26 25 23 25 23  --  GLUCOSE 94 92 95 99 84 --  BUN 10 10 12 14 14  --  CREATININE 0.86 0.95 1.05 1.13* 1.02 --  CALCIUM 10.2 9.7 10.0 10.4 10.1 --  MG 2.0 1.9 1.9 2.0 -- 2.1  PHOS 2.3 2.2* 2.2* 3.9 1.8* --   Liver Function Tests:  Lab 10/11/12 0342 10/10/12 0949  AST 20 21  ALT 12 14  ALKPHOS 42 47  BILITOT 0.2* 0.3  PROT 5.5* 6.2  ALBUMIN 2.8* 3.3*   No results found for this basename: LIPASE:5,AMYLASE:5 in the last 168 hours No results found for this basename: AMMONIA:5 in the last 168 hours CBC:  Lab 10/11/12 0342 10/10/12 1456 10/10/12 0949  WBC 5.4 5.7 6.0  NEUTROABS -- -- --  HGB 10.7* 11.8* 11.5*  HCT 33.0* 35.9* 36.2  MCV 86.6 86.9 86.4  PLT 223 240 248   Cardiac Enzymes:  Lab 10/11/12 0342 10/10/12 2024 10/10/12 1457 10/10/12 0951  CKTOTAL -- -- -- --  CKMB -- -- -- --  CKMBINDEX -- -- -- --  TROPONINI <0.30 <0.30 <0.30 <0.30   BNP: BNP (last 3 results)  Basename 02/27/12 1251  PROBNP 101.0*   CBG: No results found for this basename: GLUCAP:5 in the last 168 hours     Signed:  Rhetta Mura  [facility] 10/15/2012, 10:36 AM

## 2012-10-15 NOTE — Care Management Note (Signed)
    Page 1 of 1   10/15/2012     2:43:00 PM   CARE MANAGEMENT NOTE 10/15/2012  Patient:  ISHI, DANSER   Account Number:  [id number]  Date Initiated:  10/14/2012  Documentation initiated by:  GRAVES-BIGELOW,BRENDA  Subjective/Objective Assessment:   Pt admitted with generalized weakness. Pt is from home with husband and has a caregiver there for 5 days wk from 9a-1p and then the son comes in around 4-5:30 and provides care to the next am.     Action/Plan:   CM did call Aram Beecham daughter to discuss 24 hr supervision and she has to speak to her brother. She wanted information on SNF and CM did contact CSW [place]. Pt is currently active with [facility] for RN services. Pt will need HHPT/OT.   Anticipated DC Date:  10/15/2012   Anticipated DC Plan:  HOME W HOME HEALTH SERVICES  In-house referral  Clinical Social Worker      DC Planning Services  CM consult      Choice offered to / List presented to:             Status of service:  Completed, signed off Medicare Important Message given?   (If response is "NO", the following Medicare IM given date fields will be blank) Date Medicare IM given:   Date Additional Medicare IM given:    Discharge Disposition:  SKILLED NURSING FACILITY  Per UR Regulation:  Reviewed for med. necessity/level of care/duration of stay  If discussed at Long Length of Stay Meetings, dates discussed:    Comments:  10/15/12- 1400- Donn Pierini RN, BSN [phone] Pt discharged to [facility] today.

## 2012-10-15 NOTE — Progress Notes (Signed)
Clinical Social Worker staffed case with MD; pt to [employer] today to [facility].  CSW updated dtr and facility.  CSW to continue to follow and assist as needed.   Angelia Mould, MSW, [employer] [phone]

## 2012-10-16 ENCOUNTER — Telehealth: Payer: Self-pay | Admitting: Pulmonary Disease

## 2012-10-19 ENCOUNTER — Telehealth: Payer: Self-pay | Admitting: Pulmonary Disease

## 2012-10-19 ENCOUNTER — Other Ambulatory Visit: Payer: Self-pay | Admitting: Pulmonary Disease

## 2012-10-19 NOTE — Telephone Encounter (Signed)
Will forward to Leigh/SN as an [place]

## 2012-10-19 NOTE — Telephone Encounter (Signed)
SN is aware that the pt is in [place] place.

## 2012-10-21 DIAGNOSIS — I10 Essential (primary) hypertension: Secondary | ICD-10-CM | POA: Diagnosis not present

## 2012-10-21 DIAGNOSIS — E876 Hypokalemia: Secondary | ICD-10-CM | POA: Diagnosis not present

## 2012-10-21 DIAGNOSIS — D638 Anemia in other chronic diseases classified elsewhere: Secondary | ICD-10-CM | POA: Diagnosis not present

## 2012-10-21 DIAGNOSIS — E440 Moderate protein-calorie malnutrition: Secondary | ICD-10-CM | POA: Diagnosis not present

## 2012-10-27 ENCOUNTER — Ambulatory Visit (INDEPENDENT_AMBULATORY_CARE_PROVIDER_SITE_OTHER): Payer: Medicare Other | Admitting: Pulmonary Disease

## 2012-10-27 ENCOUNTER — Other Ambulatory Visit (INDEPENDENT_AMBULATORY_CARE_PROVIDER_SITE_OTHER): Payer: Medicare Other

## 2012-10-27 ENCOUNTER — Encounter: Payer: Self-pay | Admitting: Pulmonary Disease

## 2012-10-27 VITALS — BP 122/64 | HR 54 | Temp 97.8°F | Ht 60.0 in | Wt 113.8 lb

## 2012-10-27 DIAGNOSIS — K5730 Diverticulosis of large intestine without perforation or abscess without bleeding: Secondary | ICD-10-CM

## 2012-10-27 DIAGNOSIS — I872 Venous insufficiency (chronic) (peripheral): Secondary | ICD-10-CM

## 2012-10-27 DIAGNOSIS — I10 Essential (primary) hypertension: Secondary | ICD-10-CM

## 2012-10-27 DIAGNOSIS — F0390 Unspecified dementia without behavioral disturbance: Secondary | ICD-10-CM

## 2012-10-27 DIAGNOSIS — K219 Gastro-esophageal reflux disease without esophagitis: Secondary | ICD-10-CM

## 2012-10-27 DIAGNOSIS — E43 Unspecified severe protein-calorie malnutrition: Secondary | ICD-10-CM

## 2012-10-27 DIAGNOSIS — R269 Unspecified abnormalities of gait and mobility: Secondary | ICD-10-CM

## 2012-10-27 DIAGNOSIS — R531 Weakness: Secondary | ICD-10-CM

## 2012-10-27 DIAGNOSIS — E7800 Pure hypercholesterolemia, unspecified: Secondary | ICD-10-CM | POA: Diagnosis not present

## 2012-10-27 DIAGNOSIS — M1990 Unspecified osteoarthritis, unspecified site: Secondary | ICD-10-CM

## 2012-10-27 DIAGNOSIS — I4510 Unspecified right bundle-branch block: Secondary | ICD-10-CM | POA: Diagnosis not present

## 2012-10-27 DIAGNOSIS — R32 Unspecified urinary incontinence: Secondary | ICD-10-CM

## 2012-10-27 DIAGNOSIS — R2681 Unsteadiness on feet: Secondary | ICD-10-CM | POA: Insufficient documentation

## 2012-10-27 DIAGNOSIS — F411 Generalized anxiety disorder: Secondary | ICD-10-CM

## 2012-10-27 DIAGNOSIS — N390 Urinary tract infection, site not specified: Secondary | ICD-10-CM

## 2012-10-27 DIAGNOSIS — M810 Age-related osteoporosis without current pathological fracture: Secondary | ICD-10-CM

## 2012-10-27 LAB — BASIC METABOLIC PANEL
BUN: 14 mg/dL (ref 6–23)
CO2: 25 mEq/L (ref 19–32)
Calcium: 10.5 mg/dL (ref 8.4–10.5)
Chloride: 107 mEq/L (ref 96–112)
Creatinine, Ser: 0.8 mg/dL (ref 0.4–1.2)
GFR: 80.34 mL/min (ref >60.00)
Glucose, Bld: 95 mg/dL (ref 70–99)
Potassium: 3.5 mEq/L (ref 3.5–5.1)
Sodium: 140 mEq/L (ref 135–145)

## 2012-10-27 LAB — PHOSPHORUS: Phosphorus: 2.6 mg/dL (ref 2.3–4.6)

## 2012-10-27 NOTE — Patient Instructions (Addendum)
Today we updated your med list in our EPIC system...    We reviewed & reconciled your meds in our epic system...   We increased the Demadex to 20mg  each AM...    Continue your other meds the same...  NO SALT IN DIET...  Keep your legs elevated...  Let's plan a follow up visit in 1 month.Marland KitchenMarland Kitchen

## 2012-11-03 DIAGNOSIS — R269 Unspecified abnormalities of gait and mobility: Secondary | ICD-10-CM

## 2012-11-03 DIAGNOSIS — J4489 Other specified chronic obstructive pulmonary disease: Secondary | ICD-10-CM

## 2012-11-03 DIAGNOSIS — G609 Hereditary and idiopathic neuropathy, unspecified: Secondary | ICD-10-CM

## 2012-11-03 DIAGNOSIS — I10 Essential (primary) hypertension: Secondary | ICD-10-CM | POA: Diagnosis not present

## 2012-11-03 DIAGNOSIS — J449 Chronic obstructive pulmonary disease, unspecified: Secondary | ICD-10-CM

## 2012-11-03 DIAGNOSIS — F411 Generalized anxiety disorder: Secondary | ICD-10-CM

## 2012-11-03 DIAGNOSIS — R609 Edema, unspecified: Secondary | ICD-10-CM

## 2012-11-04 ENCOUNTER — Telehealth: Payer: Self-pay | Admitting: Pulmonary Disease

## 2012-11-04 DIAGNOSIS — R609 Edema, unspecified: Secondary | ICD-10-CM | POA: Diagnosis not present

## 2012-11-04 DIAGNOSIS — E876 Hypokalemia: Secondary | ICD-10-CM | POA: Diagnosis not present

## 2012-11-04 DIAGNOSIS — M6281 Muscle weakness (generalized): Secondary | ICD-10-CM | POA: Diagnosis not present

## 2012-11-04 DIAGNOSIS — E440 Moderate protein-calorie malnutrition: Secondary | ICD-10-CM | POA: Diagnosis not present

## 2012-11-04 DIAGNOSIS — I10 Essential (primary) hypertension: Secondary | ICD-10-CM | POA: Diagnosis not present

## 2012-11-04 NOTE — Telephone Encounter (Signed)
Spoke with patient-states she was in rehab was given different meds than what was on SN list here; pt took all her medications with her to the pharmacy and they helped her with understanding all her meds and nothing needed at this time. She is aware of what she should be taking.

## 2012-11-06 ENCOUNTER — Telehealth: Payer: Self-pay | Admitting: Pulmonary Disease

## 2012-11-06 NOTE — Telephone Encounter (Signed)
Called and spoke with penny and she was advised that the pt will need to cont the same meds at this time.  Boyd Kerbs voiced her understanding of this and will pass this info along to the pt.  Advised penny that the pt can use applesauce to help her take her meds and that this may help her to take them a little better. Nothing further is needed.

## 2012-11-07 DIAGNOSIS — I10 Essential (primary) hypertension: Secondary | ICD-10-CM | POA: Diagnosis not present

## 2012-11-07 DIAGNOSIS — F411 Generalized anxiety disorder: Secondary | ICD-10-CM | POA: Diagnosis not present

## 2012-11-07 DIAGNOSIS — R609 Edema, unspecified: Secondary | ICD-10-CM | POA: Diagnosis not present

## 2012-11-07 DIAGNOSIS — R269 Unspecified abnormalities of gait and mobility: Secondary | ICD-10-CM | POA: Diagnosis not present

## 2012-11-07 DIAGNOSIS — G609 Hereditary and idiopathic neuropathy, unspecified: Secondary | ICD-10-CM | POA: Diagnosis not present

## 2012-11-07 DIAGNOSIS — J449 Chronic obstructive pulmonary disease, unspecified: Secondary | ICD-10-CM | POA: Diagnosis not present

## 2012-11-10 DIAGNOSIS — R269 Unspecified abnormalities of gait and mobility: Secondary | ICD-10-CM | POA: Diagnosis not present

## 2012-11-10 DIAGNOSIS — R609 Edema, unspecified: Secondary | ICD-10-CM | POA: Diagnosis not present

## 2012-11-10 DIAGNOSIS — J449 Chronic obstructive pulmonary disease, unspecified: Secondary | ICD-10-CM | POA: Diagnosis not present

## 2012-11-10 DIAGNOSIS — I10 Essential (primary) hypertension: Secondary | ICD-10-CM | POA: Diagnosis not present

## 2012-11-10 DIAGNOSIS — G609 Hereditary and idiopathic neuropathy, unspecified: Secondary | ICD-10-CM | POA: Diagnosis not present

## 2012-11-10 DIAGNOSIS — F411 Generalized anxiety disorder: Secondary | ICD-10-CM | POA: Diagnosis not present

## 2012-11-13 DIAGNOSIS — J449 Chronic obstructive pulmonary disease, unspecified: Secondary | ICD-10-CM | POA: Diagnosis not present

## 2012-11-13 DIAGNOSIS — G609 Hereditary and idiopathic neuropathy, unspecified: Secondary | ICD-10-CM | POA: Diagnosis not present

## 2012-11-13 DIAGNOSIS — F411 Generalized anxiety disorder: Secondary | ICD-10-CM | POA: Diagnosis not present

## 2012-11-13 DIAGNOSIS — I10 Essential (primary) hypertension: Secondary | ICD-10-CM | POA: Diagnosis not present

## 2012-11-13 DIAGNOSIS — R269 Unspecified abnormalities of gait and mobility: Secondary | ICD-10-CM | POA: Diagnosis not present

## 2012-11-13 DIAGNOSIS — R609 Edema, unspecified: Secondary | ICD-10-CM | POA: Diagnosis not present

## 2012-11-15 ENCOUNTER — Encounter: Payer: Self-pay | Admitting: Pulmonary Disease

## 2012-11-15 NOTE — Progress Notes (Signed)
Subjective:    Patient ID: Jessica [name], female    DOB: [birth year], 78 y.o.   MRN: [id number]  HPI 78 y/o BF here for a follow up visit...  She has mult medical problems including:  AR & runny nose;  Asthmatic Bronchitis;  HBP;  Chronic VI & edema;  Hyperchol;  GERD & esoph stricture;  Divertics;  Hx Urinary incontinence & UTIs;  DJD/ Osteoporosis;  Semile Dementia;  Anxiety...  ~  December 26, 2010:  33mo ROV & clinically stable but she persists w/ mult somatic complaints:  hips> offered Ortho eval- "it's not that bad", has Mobic for Prn use;  runny nose: chr complaint, ?doesn't remember our numerous prev conversations- rec OTC Zyrtek;  "Jessica [name] needs his nerve pill"> Alpraz 0.25mg  written;  Dudley wants labs done (ate strawberries & milk this AM)> routine labs all wnl, TChol is elev but HDL is 151!  ~  March 27, 2011:  33mo ROV & she's glad to be here because "things aren't doing to good"> c/o knees weak, knee pain (Mobic helps), noct leg discomfrt (Mobic helps this too), bladder trouble & she wants to see DrTannenbaum again as DrNesi hasn't been able to help her;  Wants to take Fish Oil- OK;  We reviewed her meds & her full labs from 1/12 w/ HDL= 151!!!  ~  May 30, 2011:  27mo ROV & she states "Oh, not doing so well"> c/o allergy & runny nose; feet & legs are burning & stinging (reviewed neuropathy w/ her & offered med but she declines meds at this time); wants more PT & balance training from the same [facility] therapist who comes to the house now for regular PT; wants power chair & I wrote Rx for [facility] to start the process; never went to see DrTannenbaum after last OV & she wants to resched this appt; finally she notes that Q51mo appts aren't satisfactory for her & she wants to be seen every 27months now...  ~  August 01, 2011:  27mo ROV & she reports that she's enjoyed the therapy, waiting for her power chair, & has appt w/ Urology next week...  She persists w/ mult somatic complaints> swelling in feet (edema is  actually down w/ Lasix 20mg - 2Qam & wt down 6# to 120# today); she notes that her nose runs "off & on for >36yr", legs achy & numb, back weak, wonders about allergy shots...  BP looks good; denies CP, palpit, syncope, ch in SOB, or cerebral ischemic symptoms...  She notes some constipation & we discussed Miralax & Senakot-S therapy...  She is really frustrated w/ [name]'s needs.  ~  October 03, 2011:  27mo ROV & here for face-to-face mobility eval per [facility] for her power wheelchair> forms completed & will be faxed to [facility] & scanned into the EMR... She states "Fair but not too good" today> notes that her legs are "nummy" & we discussed neuropathy & offered Gabapentin Rx but she doesn't want more pills;  C/o continued balance problems for which she had balance training which helped but she wants me to request MORE balance PT;  BP controlled, wt stable, persist LE edema L>R, etc...  She saw DrTannenbaum recently for Urology f/u w/ small hypersens bladder & large bladder diverticulum, not a surg candidate, taught "double voiding" tech; OK Flu vaccine, refills provided per request...  ~  December 18, 2011:  2-66mo ROV & she states "not doing too well" c/o weak (esp in AMs), "nummy" feeling in legs c/w neuropathy (  offer Gabapentin Rx & she will let me know), but notes that knees are better & the therapy is helping;  Wt is down 6# but she says appetite is good & she is eating well & resting well;  BP controlled on meds; On diet alone for Chol w/ VERY hi HDL; GI stable w/ Prilosec & Miralax/ Senakot-S; she saw DrTannenbaum for Urology 10/12- mult complaints, urodynamics done w/ sm hypersens bladder unstable w/ leakage, & large bladder divertic> he rec double voiding technique...  ~  March 25, 2012:  14mo ROV & Jessica [name] is basically stable at 78 y/o; she notes some intermittent swelling in ankles & occas sharp CP laterally (min tender CWP)...  Jessica [name] is currently in rehab at [facility] & I doubt she will be able to cqare for him  when the rehab stay is up...  Her CC is "just weak", eating OK, wt= 121#, walks w/ cane, no falls, denies SOB & manages ADLs ok...  We reviewed prob list, meds, xrays & labs>  See below>> LABS 3/13:  Chems- ok x Ca=11.1;  CBC- ok w/ Hg=13.2;  TSH=1.63;  BNP=101 We will need to check PTH in follow up...  ~  May 08, 2012:  6wk ROV & Jessica [name] returns w/ increased swelling in ankles and feet- she has known VI & periph edema that has been refractory to management w/ her attempts at low sodium diet, elevation & support hose; she also takes Lasix 20mg  starting at one Qam & increased to Bid; she notes that swelling tends to go down overnight & is worse late in the day; she has lost 3# to 118# since last OV yet she is very concerned & quite perturbed about the edema...  We discussed the need to check CXR & labs; we will change the Lasix to Demadex 20mg - 2 Qam & follow up 1 month... CXR 5/13 showed borderline heart size, kyphoscoliosis, no edema, and clear lungs... LABS 5/13:  Pending ==> she forgot to go to the lab this day.  ~  June 23, 2012:  6wk ROV & Jessica [name]'s edema is diminished on the Demadex20mg - 2 daily but she prefers to split it up 1AM & 1afternoon due to urination; she has mult additional symptoms, somatic complaints, etc> eg- c/o trouble w/ her ears w/ a noise "not a ringing, it' a frying" & noted less when active, distracted w/ background noise etc (we discussed tinnitus & masking the sound w/ "white noise");  She also wants additional Physical therapy & balance retraining from [facility]...   daugh notes Jessica [name] is still in [place] but they may send him home soon...    We reviewed prob list, meds, xrays and labs> see below>> LABS 7/13:  Chems- HCO3=39, BUN=26, Creat=1.2, Ca=11.8, PTH=81... Rec- add DIAMOX 250mg /d, no calc supplements etc...  ~  July 23, 2012:  43mo ROV & Jessica [name] is doing the best she can at home w/ daughter's help but they have a hard time w/ her meds & pt's dementia... After  last visit she had more "therapy" but this too has stopped since she really "couldn't do it" per daughter... Edema has decr but persists in ankles on Demadex20-2/d (?if taking) & Diamox250/d added; note that weight is the same at 116-117# today...     We reviewed prob list, meds, xrays and labs> see below for update >> We decided to simplify her regimen> Norvasc1/2, Lisinopril1/2, Demadex- one daily, Diamox- one daily...  ~  August 25, 2012:  43mo ROV &  Jessica [name] states "Sometimes I feel like I'm doing better" and "today is not one of my worst days"; weight is down 2-3# to 115# today on her Demadex20 & Diamox250; she notes incr stress because Jessica [name] is back home & there is a lot on her- son comes in early AM hours, then a lady helps during the day, daugh in the afternoon, meals on wheels, etc...     VI, Edema> feet are still swelling, on Demadex20, Diamox250, no salt/ elevation/ support hose/ etc; labs showed stable renal w/ Cr=1.1 but K=3.4 & we will start K10Bid...    Hypercalcemia> repeat Ca=11.0 (sl improved) and PTH July2013 was 81; we are folowing & plan repeat PTH in 4-45mo; she knows to avoid calcium supplements & stop VitD as well.    Dementia> she is very slow & deliberate; I continue to feel that she & husb need NHP vs AL etc but they refuse to consider alternative living arrangements... We reviewed prob list, meds, xrays and labs> see below for updates >> LABS 9/13:  Chems- ok w/ K=3.4 (adding K10Bid), BS=90, BUN=17 Cr=1.1, Ca=11.0;  CBC- ok w/ Hg=12.5  ~  October 27, 2012:  80mo ROV & post hosp check> Jessica [name] was [facility] 10/26 - 10/15/12 by [facility] w/ weakness, poor appetite & intake, electrolyte abn (K=3.3, Ca=10.1, Phos=1.8) & prot/cal malnutrition;  She responded to IV therapy & was covered w/ Rocephin=>Cipro, Urine grew mult species;  She went to [facility] for rehab & is still there- it is clear that pt 7 husb cannot care for self & family unable to provide adequately, but finances preclude NHP  for pt & husb... She notes feeling better, strength better, PT helping...    <HBP> on ASA81, Amlod2.5, Lisin5, Demadex20-1/2, Diamox250, & K20/d; BP=122/64 & she is feeling better, still w/ mult somatic complaints...    <VI> she knows to elim sodium, elev legs, wear support hose, take the diuretics- we decided to incr Demadex back to 20mg /d...    <Chol> on diet alone; she has a very hi HDL ~150!!!    <Hypercalcemia> assoc w/ low phos- r/o hyperparathyroidism- see below...    <GI- GERD, Divertics> on prilosec20, Miralax, Senakot-S - all as needed...    <DJD, Osteopenia> off Ca supplements they sent her home from hosp w/ Phos-2Bid...    <Senile Dementia> on Aricept10 & Alpraz0.25 as needed... We reviewed prob list, meds, xrays and labs> see below for updates >>  LABS 11/13:  Chems- ok w/ K=3.5 Ca=10.5 Phos=2.6 (2.3-4.6) Mg=2.1.Marland KitchenMarland Kitchen             Problem List:  Hx of ALLERGIES (ICD-995.3) - she uses OTC meds as needed, & we prev discussed Zyrtek 10mg Qam, Saline nasal mist, & Atrovent Nasal 0.03% Tid for drainage (one of her chronic complaints)...  Hx of ASTHMATIC BRONCHITIS, ACUTE (ICD-466.0) - no recent exacerbations... ~  CXR 5/13 showed borderline heart size, kyphoscoliosis, no edema, and clear lungs...  HYPERTENSION (ICD-401.9) - controlled on ASA 81mg /d, NORVASC5mg -1/2tab, LISINOPRIL5 & DEMADEX 20mg /d & DIAMOX250mg /d... ~  4/12:  BP= 110/60 and she states that she is taking her meds regularly & tolerating them well... denies HA, visual changes, CP, palpit, change in dyspnea, etc... but notes weakness, intermittently dizzy & persist edema in her feet...  ~  6/12:  BP= 124/70 & she remains stable... ~  8/12:  BP= 110/ 66 & stable... ~  10/12:  BP= 114/62 & stable... ~  1/13:  BP= 110/64 & she remains stable... ~  4/13:  BP= 116/58 & she is reminded to adjust Lasix 1-2 Qam according to her edema... ~  5/13:  BP= 140/72 & she is c/o pedal edema; we decided to change to Demadex 20mg - 2Qam. ~   7/13:  BP= 110/60 & her edema is diminished, still 1+ in feet... Labs showed HCO3=39 & DIAMOX 250mg /d added. ~  8/13:  BP= 118/74 & edema is the same; ?what she's taking? & we decided to simplify regimen> Norvasc1/2, Lisin1/2, Demadex-1/d & Diamox-1/d... ~  9/13:  BP= 110/70 & she is clinically stable on these meds; add K10Bid for the K=3.4 today... ~  11/13:  on ASA81, Amlod2.5, Lisin5, Demadex20-1/2, Diamox250, & K20/d; BP=122/64 & she is feeling better, still w/ mult somatic complaints...  VENOUS INSUFFICIENCY, CHRONIC (ICD-459.81) - there is mild pedal edema bilat L>R... on low sodium diet, elevation, support hose- prev refused diuretic meds due to urinary symptoms, now on DEMADEX 20mg   HYPERCHOLESTEROLEMIA (ICD-272.0) - prev on Lip10, but she stopped this on her own... she has a very high HDL!!! ~  FLP was 1/08 showed TChol 253, TG 125, HDL 121, LDL 93 ~  FLP 2/09 showed TChol 200, TG 99, HDL 136, LDL 44 ~  FLP 4/11 showed TChol 244, TG 143, HDL 149, LDL 59 ~  FLP 1/12 showed TChol 266, TG 102, HDL 151, LDL 83 >> BEST HDL I'VE EVER SEEN  HYPERCALCEMIA >> routine labs w/ Calcium levels betw 10.2 & 11.7 over the last 55yrs... ~  Labs 7/13 showed Ca= 11.8 &  PTH= 81... For now avoid calc supplements etc... ~  Labs 9/13 showed Ca= 11.0 & she is reminded- no calcium, no VitD... ~  Labs 10/13 Hosp reviewed> Ca was up, Phos was low, K was low> all improved w/ Rx in hosp (they did not repeat PTH level).  GERD (ICD-530.81) - last EGD was 9/05 by DrPerry showing GERD and esoph stricture- dilated... she stopped her Nexium but states that her swallowing is good & she uses PRILOSEC OTC as needed.  DIVERTICULOSIS OF COLON (ICD-562.10) - last colonoscopy was 11/00 showing divertics only... ~  8/12:  She notes some constipation & rec to take [facility] & SENAKOT-S per our usual protocol...  HX, URINARY INFECTION (ICD-V13.02) & INCONTINENCE symptoms... she has seen DrTannenbaum & staff on bladder exercises  and she reported some improvement but has persistant symptoms & never followed up... ~  7/10:  offered f/u appt w/ Urology vs second opinion consult but she declines... ~  9/10:  wrote for trial Enablex 7.5mg /d >> no benefit she says. ~  12/10: saw DrNesi- tried sample med, sl improved, but "he turned me over to a lady for longer term treatments" she says... ~  4/12 & 6/12: states DrNesi hasn't been able to help her & she wants appt w/ DrTannenbaum==> seen w/ urodynamic eval revealing a sm hypersens bladder & a large bladder divertic; not a surg candidate, taught double voiding technique... ~  10/12: she had f/u DrTannenbaum & he reinforced prev w/u & need for double voiding technique... ~  10/13: In hosp Urine grew mult species, Rx w/ Roceph=> Cipro...  DEGENERATIVE JOINT DISEASE (ICD-715.90) - notes right shoulder symptoms, but managing OK w/ Mobic 7.5mg  & Tylenol; also right hip & low back pain...  she also c/o neuropathic "burning" in legs but not bad enough for additional meds she says...  OSTEOPOROSIS (ICD-733.00) - she was on Fosamax but had discontinued this on her own... we discussed the need for continued therapy and  she was asked to restart Alendronate but she never did...  she takes Ca++, MVI, VitD==> rec to STOP the Calcium.  SENILE DEMENTIA (ICD-290.0) - she takes ASA 81mg /d... she tried Aricept in the past but didn't notice any improvement therefore stopped it. ~  she & her husb still live on their own but are having numerous difficulties while still resisting any thoughts of assisted living etc> got concerned because she saw an impulse in her left antecubital fossa (tortuous brachial art), went to the ER & this got translated to palpitations & lead to full ER cardiac eval= neg... ~  6/11: MMSE showed 26/30 & she agreed to try DONEPEZIL 10mg /d... ~  6/12:  Still taking Aricept & appears stable... ~  7/13:  No change, same meds, clinically stable...  ANXIETY (ICD-300.00) - she uses  Alpraz 0.25mg  as needed.  Hx of SHINGLES (ICD-053.9)   Past Surgical History  Procedure Date  . Vesicovaginal fistula closure w/ tah   . Tonsillectomy   . Abdominal hysterectomy     Outpatient Encounter Prescriptions as of 10/27/2012  Medication Sig Dispense Refill  . acetaZOLAMIDE (DIAMOX) 250 MG tablet Take 250 mg by mouth daily. At 4 pm      . ALPRAZolam (XANAX) 0.25 MG tablet Take 0.125-0.25 mg by mouth 3 (three) times daily as needed. For nerves      . amLODipine (NORVASC) 5 MG tablet Take 2.5 mg by mouth every morning.      Marland Kitchen aspirin EC 81 MG tablet Take 81 mg by mouth daily.      . feeding supplement (ENSURE COMPLETE) LIQD Take 237 mLs by mouth 2 (two) times daily between meals.  60 Bottle  0  . lisinopril (PRINIVIL,ZESTRIL) 5 MG tablet Take 1 tablet (5 mg total) by mouth daily.      . Multiple Vitamin (MULTIVITAMIN) tablet Take 1 tablet by mouth daily.        . phosphorus (K PHOS NEUTRAL) [id number] MG tablet Take 2 tablets by mouth 2 (two) times daily.      . potassium chloride (K-DUR) 10 MEQ tablet in the morning and at night.      . torsemide (DEMADEX) 20 MG tablet Take 20 mg by mouth daily.      . [DISCONTINUED] torsemide (DEMADEX) 10 MG tablet Take 10 mg by mouth daily.      . [DISCONTINUED] torsemide (DEMADEX) 20 MG tablet Take 0.5 tablets (10 mg total) by mouth every other day. Take 1 tablet by mouth every morning        No Known Allergies   Current Medications, Allergies, Past Medical History, Past Surgical History, Family History, and Social History were reviewed in [employer] record.    Review of Systems        See HPI - all other systems neg except as noted...      The patient complains of decreased hearing, dyspnea on exertion, peripheral edema, incontinence, muscle weakness, and difficulty walking.  The patient denies anorexia, fever, weight loss, weight gain, vision loss, hoarseness, chest pain, syncope, prolonged cough,  headaches, hemoptysis, abdominal pain, melena, hematochezia, severe indigestion/heartburn, hematuria, suspicious skin lesions, transient blindness, depression, unusual weight change, abnormal bleeding, enlarged lymph nodes, and angioedema.     Objective:   Physical Exam      WD, WN, 78 y/o BF in NAD... GENERAL:  Alert, pleasant & cooperative; she is slow moving as noted... HEENT:  [place]/AT, EOM-full, EACs- some wax, TMs-wnl, NOSE-clear, THROAT-clear &  wnl. NECK:  Supple w/ fairROM; no JVD; normal carotid impulses w/o bruits; no thyromegaly or nodules palpated; no lymphadenopathy. CHEST:  Clear to P & A; without wheezes/ rales/ or rhonchi., noted kyphosis... HEART:  Regular Rhythm; without murmurs/ rubs/ or gallops heard... ABDOMEN:  Soft & nontender; normal bowel sounds; no organomegaly or masses detected. EXT:  mod arthritic changes; no varicose veins/ +venous insuffic/ 1-2+edema... NEURO:   Alert, answers questions appropriately, but slowly, no focal neuro deficits... DERM:  No lesions noted; no rash etc...  MMSE> 06/13/10> Score 26/30 (missed place, 2/3 recall after distraction, & copy figure)...   Assessment & Plan:    AR & ASTHMA>  Breathing is stable, at baseline, she has chr rhinorrhea complaints w/ numerous discussions regarding management of this chronic problem....  HBP>  Controlled on meds, continue same... Chems showed HCO3=39 on the Demadex, added DIAMOX 250mg /d & improved to 33...  Electrolye Abn>  See 10/13 Hosp by [facility], but I am sus[picious that incr Ca & low Phos is from Hyper-PTH...  Ven insuffic>  Her CC is pedal edema, she does not grasp the idea of ven insuffic but says she's not eating salt; rec elevation, support hose & continue Demadex20mg Qam & Diamox250Qpm.  CHOL>  She has the world's best HDL!!!  Hypercalcemia>  She is instructed to stop her calcium supplement & we will recheck w/ PTH level in 4-36mo.  GU>  She saw DrTannenbaum w/ extensive investigation into  her voiding symptoms & urodynamics as above; rec to use "double voiding" technique...  DJD/ Osteopenia>  Aware, she is stable on OTC meds, she has repeatedly refuse Bisphos meds for her bones...  Dementia>  She & husb Jessica [name] are still living independently & have been married 62 yrs.  Other medical problems as noted...   Patient's Medications  New Prescriptions   No medications on file  Previous Medications   ACETAZOLAMIDE (DIAMOX) 250 MG TABLET    Take 250 mg by mouth daily. At 4 pm   ALPRAZOLAM (XANAX) 0.25 MG TABLET    Take 0.125-0.25 mg by mouth 3 (three) times daily as needed. For nerves   AMLODIPINE (NORVASC) 5 MG TABLET    Take 2.5 mg by mouth every morning.   ASPIRIN EC 81 MG TABLET    Take 81 mg by mouth daily.   FEEDING SUPPLEMENT (ENSURE COMPLETE) LIQD    Take 237 mLs by mouth 2 (two) times daily between meals.   LISINOPRIL (PRINIVIL,ZESTRIL) 5 MG TABLET    Take 1 tablet (5 mg total) by mouth daily.   MULTIPLE VITAMIN (MULTIVITAMIN) TABLET    Take 1 tablet by mouth daily.     PHOSPHORUS (K PHOS NEUTRAL) [id number] MG TABLET    Take 2 tablets by mouth 2 (two) times daily.   POTASSIUM CHLORIDE (K-DUR) 10 MEQ TABLET    in the morning and at night.   TORSEMIDE (DEMADEX) 20 MG TABLET    Take 20 mg by mouth daily.  Modified Medications   No medications on file  Discontinued Medications   TORSEMIDE (DEMADEX) 10 MG TABLET    Take 10 mg by mouth daily.   TORSEMIDE (DEMADEX) 20 MG TABLET    Take 0.5 tablets (10 mg total) by mouth every other day. Take 1 tablet by mouth every morning

## 2012-11-17 DIAGNOSIS — J449 Chronic obstructive pulmonary disease, unspecified: Secondary | ICD-10-CM | POA: Diagnosis not present

## 2012-11-17 DIAGNOSIS — R609 Edema, unspecified: Secondary | ICD-10-CM | POA: Diagnosis not present

## 2012-11-17 DIAGNOSIS — R269 Unspecified abnormalities of gait and mobility: Secondary | ICD-10-CM | POA: Diagnosis not present

## 2012-11-17 DIAGNOSIS — G609 Hereditary and idiopathic neuropathy, unspecified: Secondary | ICD-10-CM | POA: Diagnosis not present

## 2012-11-17 DIAGNOSIS — I10 Essential (primary) hypertension: Secondary | ICD-10-CM | POA: Diagnosis not present

## 2012-11-17 DIAGNOSIS — F411 Generalized anxiety disorder: Secondary | ICD-10-CM | POA: Diagnosis not present

## 2012-11-18 DIAGNOSIS — R609 Edema, unspecified: Secondary | ICD-10-CM | POA: Diagnosis not present

## 2012-11-18 DIAGNOSIS — F411 Generalized anxiety disorder: Secondary | ICD-10-CM | POA: Diagnosis not present

## 2012-11-18 DIAGNOSIS — G609 Hereditary and idiopathic neuropathy, unspecified: Secondary | ICD-10-CM | POA: Diagnosis not present

## 2012-11-18 DIAGNOSIS — I10 Essential (primary) hypertension: Secondary | ICD-10-CM | POA: Diagnosis not present

## 2012-11-18 DIAGNOSIS — R269 Unspecified abnormalities of gait and mobility: Secondary | ICD-10-CM | POA: Diagnosis not present

## 2012-11-18 DIAGNOSIS — J449 Chronic obstructive pulmonary disease, unspecified: Secondary | ICD-10-CM | POA: Diagnosis not present

## 2012-11-20 DIAGNOSIS — I10 Essential (primary) hypertension: Secondary | ICD-10-CM | POA: Diagnosis not present

## 2012-11-20 DIAGNOSIS — F411 Generalized anxiety disorder: Secondary | ICD-10-CM | POA: Diagnosis not present

## 2012-11-20 DIAGNOSIS — R269 Unspecified abnormalities of gait and mobility: Secondary | ICD-10-CM | POA: Diagnosis not present

## 2012-11-20 DIAGNOSIS — G609 Hereditary and idiopathic neuropathy, unspecified: Secondary | ICD-10-CM | POA: Diagnosis not present

## 2012-11-20 DIAGNOSIS — J449 Chronic obstructive pulmonary disease, unspecified: Secondary | ICD-10-CM | POA: Diagnosis not present

## 2012-11-20 DIAGNOSIS — R609 Edema, unspecified: Secondary | ICD-10-CM | POA: Diagnosis not present

## 2012-11-23 DIAGNOSIS — R269 Unspecified abnormalities of gait and mobility: Secondary | ICD-10-CM | POA: Diagnosis not present

## 2012-11-23 DIAGNOSIS — R609 Edema, unspecified: Secondary | ICD-10-CM | POA: Diagnosis not present

## 2012-11-23 DIAGNOSIS — G609 Hereditary and idiopathic neuropathy, unspecified: Secondary | ICD-10-CM | POA: Diagnosis not present

## 2012-11-23 DIAGNOSIS — I10 Essential (primary) hypertension: Secondary | ICD-10-CM | POA: Diagnosis not present

## 2012-11-23 DIAGNOSIS — F411 Generalized anxiety disorder: Secondary | ICD-10-CM | POA: Diagnosis not present

## 2012-11-23 DIAGNOSIS — J449 Chronic obstructive pulmonary disease, unspecified: Secondary | ICD-10-CM | POA: Diagnosis not present

## 2012-11-25 DIAGNOSIS — F411 Generalized anxiety disorder: Secondary | ICD-10-CM | POA: Diagnosis not present

## 2012-11-25 DIAGNOSIS — G609 Hereditary and idiopathic neuropathy, unspecified: Secondary | ICD-10-CM | POA: Diagnosis not present

## 2012-11-25 DIAGNOSIS — R609 Edema, unspecified: Secondary | ICD-10-CM | POA: Diagnosis not present

## 2012-11-25 DIAGNOSIS — I10 Essential (primary) hypertension: Secondary | ICD-10-CM | POA: Diagnosis not present

## 2012-11-25 DIAGNOSIS — J449 Chronic obstructive pulmonary disease, unspecified: Secondary | ICD-10-CM | POA: Diagnosis not present

## 2012-11-25 DIAGNOSIS — R269 Unspecified abnormalities of gait and mobility: Secondary | ICD-10-CM | POA: Diagnosis not present

## 2012-11-27 DIAGNOSIS — J449 Chronic obstructive pulmonary disease, unspecified: Secondary | ICD-10-CM | POA: Diagnosis not present

## 2012-11-27 DIAGNOSIS — G609 Hereditary and idiopathic neuropathy, unspecified: Secondary | ICD-10-CM | POA: Diagnosis not present

## 2012-11-27 DIAGNOSIS — R269 Unspecified abnormalities of gait and mobility: Secondary | ICD-10-CM | POA: Diagnosis not present

## 2012-11-27 DIAGNOSIS — R609 Edema, unspecified: Secondary | ICD-10-CM | POA: Diagnosis not present

## 2012-11-27 DIAGNOSIS — I10 Essential (primary) hypertension: Secondary | ICD-10-CM | POA: Diagnosis not present

## 2012-11-27 DIAGNOSIS — F411 Generalized anxiety disorder: Secondary | ICD-10-CM | POA: Diagnosis not present

## 2012-11-30 DIAGNOSIS — G609 Hereditary and idiopathic neuropathy, unspecified: Secondary | ICD-10-CM | POA: Diagnosis not present

## 2012-11-30 DIAGNOSIS — J449 Chronic obstructive pulmonary disease, unspecified: Secondary | ICD-10-CM | POA: Diagnosis not present

## 2012-11-30 DIAGNOSIS — I10 Essential (primary) hypertension: Secondary | ICD-10-CM | POA: Diagnosis not present

## 2012-11-30 DIAGNOSIS — R269 Unspecified abnormalities of gait and mobility: Secondary | ICD-10-CM | POA: Diagnosis not present

## 2012-11-30 DIAGNOSIS — R609 Edema, unspecified: Secondary | ICD-10-CM | POA: Diagnosis not present

## 2012-11-30 DIAGNOSIS — F411 Generalized anxiety disorder: Secondary | ICD-10-CM | POA: Diagnosis not present

## 2012-12-01 ENCOUNTER — Telehealth: Payer: Self-pay | Admitting: Pulmonary Disease

## 2012-12-01 DIAGNOSIS — F411 Generalized anxiety disorder: Secondary | ICD-10-CM | POA: Diagnosis not present

## 2012-12-01 DIAGNOSIS — G609 Hereditary and idiopathic neuropathy, unspecified: Secondary | ICD-10-CM | POA: Diagnosis not present

## 2012-12-01 DIAGNOSIS — R609 Edema, unspecified: Secondary | ICD-10-CM | POA: Diagnosis not present

## 2012-12-01 DIAGNOSIS — I10 Essential (primary) hypertension: Secondary | ICD-10-CM | POA: Diagnosis not present

## 2012-12-01 DIAGNOSIS — R269 Unspecified abnormalities of gait and mobility: Secondary | ICD-10-CM | POA: Diagnosis not present

## 2012-12-01 DIAGNOSIS — J449 Chronic obstructive pulmonary disease, unspecified: Secondary | ICD-10-CM | POA: Diagnosis not present

## 2012-12-01 NOTE — Telephone Encounter (Signed)
I spoke with Jessica [name] and she is requesting reill on phosphorus. Pt was giving this in the hospital. Never rx'd by [place] or refilled by [place]. She takes 2 po BID. Please advise if okay to do so? Thanks Last OV 10/15/12 Pending 12/03/12

## 2012-12-02 ENCOUNTER — Encounter: Payer: Self-pay | Admitting: *Deleted

## 2012-12-03 ENCOUNTER — Encounter: Payer: Self-pay | Admitting: Pulmonary Disease

## 2012-12-03 ENCOUNTER — Ambulatory Visit (INDEPENDENT_AMBULATORY_CARE_PROVIDER_SITE_OTHER): Payer: Medicare Other | Admitting: Pulmonary Disease

## 2012-12-03 ENCOUNTER — Other Ambulatory Visit (INDEPENDENT_AMBULATORY_CARE_PROVIDER_SITE_OTHER): Payer: Medicare Other

## 2012-12-03 VITALS — BP 124/72 | HR 58 | Temp 97.3°F | Ht 60.0 in | Wt 114.2 lb

## 2012-12-03 DIAGNOSIS — I872 Venous insufficiency (chronic) (peripheral): Secondary | ICD-10-CM

## 2012-12-03 DIAGNOSIS — G589 Mononeuropathy, unspecified: Secondary | ICD-10-CM

## 2012-12-03 DIAGNOSIS — I10 Essential (primary) hypertension: Secondary | ICD-10-CM | POA: Diagnosis not present

## 2012-12-03 DIAGNOSIS — I4510 Unspecified right bundle-branch block: Secondary | ICD-10-CM | POA: Diagnosis not present

## 2012-12-03 DIAGNOSIS — R531 Weakness: Secondary | ICD-10-CM

## 2012-12-03 DIAGNOSIS — R609 Edema, unspecified: Secondary | ICD-10-CM | POA: Diagnosis not present

## 2012-12-03 DIAGNOSIS — F0390 Unspecified dementia without behavioral disturbance: Secondary | ICD-10-CM

## 2012-12-03 DIAGNOSIS — E7800 Pure hypercholesterolemia, unspecified: Secondary | ICD-10-CM

## 2012-12-03 DIAGNOSIS — K219 Gastro-esophageal reflux disease without esophagitis: Secondary | ICD-10-CM

## 2012-12-03 DIAGNOSIS — K5730 Diverticulosis of large intestine without perforation or abscess without bleeding: Secondary | ICD-10-CM

## 2012-12-03 DIAGNOSIS — M1990 Unspecified osteoarthritis, unspecified site: Secondary | ICD-10-CM

## 2012-12-03 DIAGNOSIS — R32 Unspecified urinary incontinence: Secondary | ICD-10-CM

## 2012-12-03 LAB — BASIC METABOLIC PANEL
CO2: 31 mEq/L (ref 19–32)
Chloride: 106 mEq/L (ref 96–112)
Creatinine, Ser: 1.1 mg/dL (ref 0.4–1.2)
Potassium: 3.5 mEq/L (ref 3.5–5.1)

## 2012-12-03 MED ORDER — K PHOS MONO-SOD PHOS DI & MONO 155-852-130 MG PO TABS: 1.0000 | ORAL_TABLET | Freq: Two times a day (BID) | ORAL | Status: DC

## 2012-12-03 NOTE — Progress Notes (Signed)
Subjective:    Patient ID: Jessica [name], female    DOB: 1911/01/31, 78 y.o.   MRN: [id number]  HPI 78 y/o BF here for a follow up visit...  She has mult medical problems including:  AR & runny nose;  Asthmatic Bronchitis;  HBP;  Chronic VI & edema;  Hyperchol;  GERD & esoph stricture;  Divertics;  Hx Urinary incontinence & UTIs;  DJD/ Osteoporosis;  Semile Dementia;  Anxiety...  SEE PREV EPIC NOTES FOR EARLIER DATA >>  ~  March 25, 2012:  35mo ROV & [name] is basically stable at 78 y/o; she notes some intermittent swelling in ankles & occas sharp CP laterally (min tender CWP)...  [name] is currently in rehab at [place] & I doubt she will be able to cqare for him when the rehab stay is up...  Her CC is "just weak", eating OK, wt= 121#, walks w/ cane, no falls, denies SOB & manages ADLs ok...  We reviewed prob list, meds, xrays & labs>  See below>> LABS 3/13:  Chems- ok x Ca=11.1;  CBC- ok w/ Hg=13.2;  TSH=1.63;  BNP=101 We will need to check PTH in follow up...  ~  May 08, 2012:  6wk ROV & Jessica [name] returns w/ increased swelling in ankles and feet- she has known VI & periph edema that has been refractory to management w/ her attempts at low sodium diet, elevation & support hose; she also takes Lasix 20mg  starting at one Qam & increased to Bid; she notes that swelling tends to go down overnight & is worse late in the day; she has lost 3# to 118# since last OV yet she is very concerned & quite perturbed about the edema...  We discussed the need to check CXR & labs; we will change the Lasix to Demadex 20mg - 2 Qam & follow up 1 month... CXR 5/13 showed borderline heart size, kyphoscoliosis, no edema, and clear lungs... LABS 5/13:  Pending ==> she forgot to go to the lab this day.  ~  June 23, 2012:  6wk ROV & [name]'s edema is diminished on the Demadex20mg - 2 daily but she prefers to split it up 1AM & 1afternoon due to urination; she has mult additional symptoms, somatic complaints, etc> eg- c/o trouble w/ her  ears w/ a noise "not a ringing, it' a frying" & noted less when active, distracted w/ background noise etc (we discussed tinnitus & masking the sound w/ "white noise");  She also wants additional Physical therapy & balance retraining from [facility]...   daugh notes [name] is still in [place] but they may send him home soon...    We reviewed prob list, meds, xrays and labs> see below>> LABS 7/13:  Chems- HCO3=39, BUN=26, Creat=1.2, Ca=11.8, PTH=81... Rec- add DIAMOX 250mg /d, no calc supplements etc...  ~  July 23, 2012:  32mo ROV & Jessica [name] is doing the best she can at home w/ daughter's help but they have a hard time w/ her meds & pt's dementia... After last visit she had more "therapy" but this too has stopped since she really "couldn't do it" per daughter... Edema has decr but persists in ankles on Demadex20-2/d (?if taking) & Diamox250/d added; note that weight is the same at 116-117# today...     We reviewed prob list, meds, xrays and labs> see below for update >> We decided to simplify her regimen> Norvasc1/2, Lisinopril1/2, Demadex- one daily, Diamox- one daily...  ~  August 25, 2012:  32mo ROV & Jessica [name] states "  Sometimes I feel like I'm doing better" and "today is not one of my worst days"; weight is down 2-3# to 115# today on her Demadex20 & Diamox250; she notes incr stress because [name] is back home & there is a lot on her- son comes in early AM hours, then a lady helps during the day, daugh in the afternoon, meals on wheels, etc...     VI, Edema> feet are still swelling, on Demadex20, Diamox250, no salt/ elevation/ support hose/ etc; labs showed stable renal w/ Cr=1.1 but K=3.4 & we will start K10Bid...    Hypercalcemia> repeat Ca=11.0 (sl improved) and PTH July2013 was 81; we are folowing & plan repeat PTH in 4-28mo; she knows to avoid calcium supplements & stop VitD as well.    Dementia> she is very slow & deliberate; I continue to feel that she & husb need NHP vs AL etc but they  refuse to consider alternative living arrangements... We reviewed prob list, meds, xrays and labs> see below for updates >> LABS 9/13:  Chems- ok w/ K=3.4 (adding K10Bid), BS=90, BUN=17 Cr=1.1, Ca=11.0;  CBC- ok w/ Hg=12.5  ~  October 27, 2012:  75mo ROV & post hosp check> Jessica [name] was [facility] 10/26 - 10/15/12 by [facility] w/ weakness, poor appetite & intake, electrolyte abn (K=3.3, Ca=10.1, Phos=1.8) & prot/cal malnutrition;  She responded to IV therapy & was covered w/ Rocephin=>Cipro, Urine grew mult species;  She went to [facility] for rehab & is still there- it is clear that pt & husb cannot care for self & family unable to provide adequately, but finances preclude NHP for pt & husb... She notes feeling better, strength better, PT helping...    <HBP> on ASA81, Amlod2.5, Lisin5, Demadex20-1/2, Diamox250, & K20/d; BP=122/64 & she is feeling better, still w/ mult somatic complaints...    <VI> she knows to elim sodium, elev legs, wear support hose, take the diuretics- we decided to incr Demadex back to 20mg /d...    <Chol> on diet alone; she has a very hi HDL ~150!!!    <Hypercalcemia> assoc w/ low phos- r/o hyperparathyroidism- see below...    <GI- GERD, Divertics> on Prilosec20, Miralax, Senakot-S - all as needed...    <GU- saw DrTannenbaum for Urology 10/12- mult complaints, urodynamics done w/ sm hypersens bladder unstable w/ leakage, & large bladder divertic> he rec double voiding technique...    <DJD, Osteopenia> off Ca supplements they sent her home from hosp w/ Phos-2Bid...    <Senile Dementia> on Aricept10 & Alpraz0.25 as needed... We reviewed prob list, meds, xrays and labs> see below for updates >>  LABS 11/13:  Chems- ok w/ K=3.5 Ca=10.5 Phos=2.6 (2.3-4.6) Mg=2.1.Marland Kitchen.      ~  December 03, 2012:  76mo ROV & Jessica [name]'s CC= swelling in feet; her wt= 114# up 1# on Demadex20, Diamox250, K10-3/d, and KPhos-Bid; reminded to elim sodium, elev legs, wear support hose...  She is under a lot of stress w/ [place]  home, son helps but works...      We reviewed prob list, meds, xrays and labs> see below for updates >>  LABS 12/13:  Chems- ok w/ K=3.5 Creat=1.1 Ca=10.8 Phos=3.6 PTH=122 (c/w hyperparathyroidism)...  We decided to continue her same meds for now...         Problem List:  Hx of ALLERGIES (ICD-995.3) - she uses OTC meds as needed, & we prev discussed Zyrtek 10mg Qam, Saline nasal mist, & Atrovent Nasal 0.03% Tid for drainage (one of her chronic complaints)...  Hx of  ASTHMATIC BRONCHITIS, ACUTE (ICD-466.0) - no recent exacerbations... ~  CXR 5/13 showed borderline heart size, kyphoscoliosis, no edema, and clear lungs... ~  CXR 10/13 showed normal heart size, clear lungs, kyphoscoliosis, osteopenia...  HYPERTENSION (ICD-401.9) - controlled on ASA 81mg /d, NORVASC5mg -1/2tab, LISINOPRIL5 & DEMADEX 20mg /d & DIAMOX250mg /d... ~  4/12:  BP= 110/60 and she states that she is taking her meds regularly & tolerating them well... denies HA, visual changes, CP, palpit, change in dyspnea, etc... but notes weakness, intermittently dizzy & persist edema in her feet...  ~  6/12:  BP= 124/70 & she remains stable... ~  8/12:  BP= 110/ 66 & stable... ~  10/12:  BP= 114/62 & stable... ~  1/13:  BP= 110/64 & she remains stable... ~  4/13:  BP= 116/58 & she is reminded to adjust Lasix 1-2 Qam according to her edema... ~  5/13:  BP= 140/72 & she is c/o pedal edema; we decided to change to Demadex 20mg - 2Qam. ~  7/13:  BP= 110/60 & her edema is diminished, still 1+ in feet... Labs showed HCO3=39 & DIAMOX 250mg /d added. ~  8/13:  BP= 118/74 & edema is the same; ?what she's taking? & we decided to simplify regimen> Norvasc1/2, Lisin1/2, Demadex-1/d & Diamox-1/d... ~  9/13:  BP= 110/70 & she is clinically stable on these meds; add K10Bid for the K=3.4 today... ~  EKG 10/13 showed NSR, rate63, RBBB, poss old infarct anterolaterally... ~  11/13:  on ASA81, Amlod2.5, Lisin5, Demadex20-1/2, Diamox250, & K20/d; BP=122/64 &  she is feeling better, still w/ mult somatic complaints... ~  12/13:  BP= 124/72 & her edema, weight, etc are about stable...  VENOUS INSUFFICIENCY, CHRONIC (ICD-459.81) - there is mild pedal edema bilat L>R... on low sodium diet, elevation, support hose- prev refused diuretic meds due to urinary symptoms, now on DEMADEX 20mg   HYPERCHOLESTEROLEMIA (ICD-272.0) - prev on Lip10, but she stopped this on her own... she has a very high HDL!!! ~  FLP was 1/08 showed TChol 253, TG 125, HDL 121, LDL 93 ~  FLP 2/09 showed TChol 200, TG 99, HDL 136, LDL 44 ~  FLP 4/11 showed TChol 244, TG 143, HDL 149, LDL 59 ~  FLP 1/12 showed TChol 266, TG 102, HDL 151, LDL 83 >> BEST HDL I'VE EVER SEEN  HYPERCALCEMIA >> routine labs w/ Calcium levels betw 10.2 & 11.7 over the last 37yrs... ~  Labs 7/13 showed Ca= 11.8 &  PTH= 81... For now avoid calc supplements etc... ~  Labs 9/13 showed Ca= 11.0 & she is reminded- no calcium, no VitD... ~  Labs 10/13 Hosp reviewed> Ca was up, Phos was low, K was low> all improved w/ Rx in hosp (they did not repeat PTH level).  GERD (ICD-530.81) - last EGD was 9/05 by DrPerry showing GERD and esoph stricture- dilated... she stopped her Nexium but states that her swallowing is good & she uses PRILOSEC OTC as needed.  DIVERTICULOSIS OF COLON (ICD-562.10) - last colonoscopy was 11/00 showing divertics only... ~  8/12:  She notes some constipation & rec to take [facility] & SENAKOT-S per our usual protocol...  HX, URINARY INFECTION (ICD-V13.02) & INCONTINENCE symptoms... she has seen DrTannenbaum & staff on bladder exercises and she reported some improvement but has persistant symptoms & never followed up... ~  7/10:  offered f/u appt w/ Urology vs second opinion consult but she declines... ~  9/10:  wrote for trial Enablex 7.5mg /d >> no benefit she says. ~  12/10: saw  DrNesi- tried sample med, sl improved, but "he turned me over to a lady for longer term treatments" she says... ~  4/12 &  6/12: states DrNesi hasn't been able to help her & she wants appt w/ DrTannenbaum==> seen w/ urodynamic eval revealing a sm hypersens bladder & a large bladder divertic; not a surg candidate, taught double voiding technique... ~  10/12: she had f/u DrTannenbaum & he reinforced prev w/u & need for double voiding technique... ~  10/13: In hosp Urine grew mult species, Rx w/ Roceph=> Cipro...  DEGENERATIVE JOINT DISEASE (ICD-715.90) - notes right shoulder symptoms, but managing OK w/ Mobic 7.5mg  & Tylenol; also right hip & low back pain...  she also c/o neuropathic "burning" in legs but not bad enough for additional meds she says...  OSTEOPOROSIS (ICD-733.00) - she was on Fosamax but had discontinued this on her own... we discussed the need for continued therapy and she was asked to restart Alendronate but she never did...  she takes Ca++, MVI, VitD==> rec to STOP the Calcium.  SENILE DEMENTIA (ICD-290.0) - she takes ASA 81mg /d... she tried Aricept in the past but didn't notice any improvement therefore stopped it. ~  she & her husb still live on their own but are having numerous difficulties while still resisting any thoughts of assisted living etc> got concerned because she saw an impulse in her left antecubital fossa (tortuous brachial art), went to the ER & this got translated to palpitations & lead to full ER cardiac eval= neg... ~  6/11: MMSE showed 26/30 & she agreed to try DONEPEZIL 10mg /d... ~  6/12:  Still taking Aricept & appears stable... ~  7/13:  No change, same meds, clinically stable...  ANXIETY (ICD-300.00) - she uses Alpraz 0.25mg  as needed.  Hx of SHINGLES (ICD-053.9)   Past Surgical History  Procedure Date  . Vesicovaginal fistula closure w/ tah   . Tonsillectomy   . Abdominal hysterectomy     Outpatient Encounter Prescriptions as of 12/03/2012  Medication Sig Dispense Refill  . acetaZOLAMIDE (DIAMOX) 250 MG tablet Take 250 mg by mouth daily. At 4 pm      . ALPRAZolam  (XANAX) 0.25 MG tablet Take 0.125-0.25 mg by mouth 3 (three) times daily as needed. For nerves      . amLODipine (NORVASC) 5 MG tablet Take 2.5 mg by mouth every morning.      Marland Kitchen aspirin EC 81 MG tablet Take 81 mg by mouth daily.      . feeding supplement (ENSURE COMPLETE) LIQD Take 237 mLs by mouth 2 (two) times daily between meals.  60 Bottle  0  . lisinopril (PRINIVIL,ZESTRIL) 5 MG tablet Take 1 tablet (5 mg total) by mouth daily.      . Multiple Vitamin (MULTIVITAMIN) tablet Take 1 tablet by mouth daily.        . phosphorus (K PHOS NEUTRAL) 155-852-130 MG tablet Take 1 tablet by mouth 2 (two) times daily.  60 tablet  11  . potassium chloride (K-DUR) 10 MEQ tablet in the morning and at night.      . torsemide (DEMADEX) 20 MG tablet Take 20 mg by mouth daily.        No Known Allergies   Current Medications, Allergies, Past Medical History, Past Surgical History, Family History, and Social History were reviewed in [employer] record.    Review of Systems        See HPI - all other systems neg except as  noted...      The patient complains of decreased hearing, dyspnea on exertion, peripheral edema, incontinence, muscle weakness, and difficulty walking.  The patient denies anorexia, fever, weight loss, weight gain, vision loss, hoarseness, chest pain, syncope, prolonged cough, headaches, hemoptysis, abdominal pain, melena, hematochezia, severe indigestion/heartburn, hematuria, suspicious skin lesions, transient blindness, depression, unusual weight change, abnormal bleeding, enlarged lymph nodes, and angioedema.     Objective:   Physical Exam      WD, WN, 78 y/o BF in NAD... GENERAL:  Alert, pleasant & cooperative; she is slow moving as noted... HEENT:  [place]/AT, EOM-full, EACs- some wax, TMs-wnl, NOSE-clear, THROAT-clear & wnl. NECK:  Supple w/ fairROM; no JVD; normal carotid impulses w/o bruits; no thyromegaly or nodules palpated; no  lymphadenopathy. CHEST:  Clear to P & A; without wheezes/ rales/ or rhonchi., noted kyphosis... HEART:  Regular Rhythm; without murmurs/ rubs/ or gallops heard... ABDOMEN:  Soft & nontender; normal bowel sounds; no organomegaly or masses detected. EXT:  mod arthritic changes; no varicose veins/ +venous insuffic/ 1-2+edema... NEURO:   Alert, answers questions appropriately, but slowly, no focal neuro deficits... DERM:  No lesions noted; no rash etc...  MMSE> 06/13/10> Score 26/30 (missed place, 2/3 recall after distraction, & copy figure)...   Assessment & Plan:    AR & ASTHMA>  Breathing is stable, at baseline, she has chr rhinorrhea complaints w/ numerous discussions regarding management of this chronic problem....  HBP>  Controlled on meds, continue same... Chems showed HCO3=39 on the Demadex, added DIAMOX 250mg /d & improved to 33...  Electrolye Abn>  See 10/13 Hosp by [facility], but I am sus[picious that incr Ca & low Phos is from Hyper-PTH...  Ven insuffic>  Her CC is pedal edema, she does not grasp the idea of ven insuffic but says she's not eating salt; rec elevation, support hose & continue Demadex20mg Qam & Diamox250Qpm.  CHOL>  She has the world's best HDL!!!  Hypercalcemia>  She is instructed to stop her calcium supplement & we will recheck w/ PTH level in 4-69mo.  GU>  She saw DrTannenbaum w/ extensive investigation into her voiding symptoms & urodynamics as above; rec to use "double voiding" technique...  DJD/ Osteopenia>  Aware, she is stable on OTC meds, she has repeatedly refuse Bisphos meds for her bones...  Dementia>  She & husb [name] are still living independently & have been married 62 yrs.  Other medical problems as noted...   Patient's Medications  New Prescriptions   No medications on file  Previous Medications   ACETAZOLAMIDE (DIAMOX) 250 MG TABLET    Take 250 mg by mouth daily. At 4 pm   ALPRAZOLAM (XANAX) 0.25 MG TABLET    Take 0.125-0.25 mg by mouth 3  (three) times daily as needed. For nerves   AMLODIPINE (NORVASC) 5 MG TABLET    Take 2.5 mg by mouth every morning.   ASPIRIN EC 81 MG TABLET    Take 81 mg by mouth daily.   FEEDING SUPPLEMENT (ENSURE COMPLETE) LIQD    Take 237 mLs by mouth 2 (two) times daily between meals.   LISINOPRIL (PRINIVIL,ZESTRIL) 5 MG TABLET    Take 1 tablet (5 mg total) by mouth daily.   MULTIPLE VITAMIN (MULTIVITAMIN) TABLET    Take 1 tablet by mouth daily.     PHOSPHORUS (K PHOS NEUTRAL) 155-852-130 MG TABLET    Take 1 tablet by mouth 2 (two) times daily.   POTASSIUM CHLORIDE (K-DUR) 10 MEQ TABLET    in the  morning and at night.   TORSEMIDE (DEMADEX) 20 MG TABLET    Take 20 mg by mouth daily.  Modified Medications   No medications on file  Discontinued Medications   No medications on file

## 2012-12-03 NOTE — Telephone Encounter (Signed)
Per SN--ok to refill the phosphorus 250 mg  #60  1 po bid (this is a decrease in the dose)

## 2012-12-03 NOTE — Patient Instructions (Addendum)
Today we updated your med list in our EPIC system...    Continue your current medications the same...  Rest at home w/ your feet elevated!!!  Today we did your follow up blood work to see if we can safely adjust your fluid pills...    We will call you w/ the results...  Call for any problems...  Let's plan another recheck in 1 month.Marland KitchenMarland Kitchen

## 2012-12-04 LAB — PTH, INTACT AND CALCIUM: PTH: 121.9 pg/mL — ABNORMAL HIGH (ref 14.0–72.0)

## 2012-12-18 ENCOUNTER — Telehealth: Payer: Self-pay | Admitting: Pulmonary Disease

## 2012-12-18 MED ORDER — TORSEMIDE 20 MG PO TABS: 20.0000 mg | ORAL_TABLET | Freq: Every day | ORAL | Status: DC

## 2012-12-18 MED ORDER — K PHOS MONO-SOD PHOS DI & MONO 155-852-130 MG PO TABS: 1.0000 | ORAL_TABLET | Freq: Two times a day (BID) | ORAL | Status: DC

## 2012-12-18 NOTE — Telephone Encounter (Signed)
Pt is aware that her medications have been sent in.

## 2012-12-18 NOTE — Telephone Encounter (Signed)
Duplicate message.  This has already been taken care of.  

## 2013-01-08 ENCOUNTER — Ambulatory Visit (INDEPENDENT_AMBULATORY_CARE_PROVIDER_SITE_OTHER): Payer: Medicare Other | Admitting: Pulmonary Disease

## 2013-01-08 ENCOUNTER — Encounter: Payer: Self-pay | Admitting: Pulmonary Disease

## 2013-01-08 VITALS — BP 118/60 | HR 56 | Temp 96.4°F | Ht 60.0 in | Wt 113.6 lb

## 2013-01-08 DIAGNOSIS — R32 Unspecified urinary incontinence: Secondary | ICD-10-CM

## 2013-01-08 DIAGNOSIS — F0390 Unspecified dementia without behavioral disturbance: Secondary | ICD-10-CM

## 2013-01-08 DIAGNOSIS — I4510 Unspecified right bundle-branch block: Secondary | ICD-10-CM

## 2013-01-08 DIAGNOSIS — M810 Age-related osteoporosis without current pathological fracture: Secondary | ICD-10-CM

## 2013-01-08 DIAGNOSIS — I10 Essential (primary) hypertension: Secondary | ICD-10-CM

## 2013-01-08 DIAGNOSIS — R609 Edema, unspecified: Secondary | ICD-10-CM

## 2013-01-08 DIAGNOSIS — K5730 Diverticulosis of large intestine without perforation or abscess without bleeding: Secondary | ICD-10-CM

## 2013-01-08 DIAGNOSIS — R531 Weakness: Secondary | ICD-10-CM

## 2013-01-08 DIAGNOSIS — K219 Gastro-esophageal reflux disease without esophagitis: Secondary | ICD-10-CM

## 2013-01-08 DIAGNOSIS — I872 Venous insufficiency (chronic) (peripheral): Secondary | ICD-10-CM

## 2013-01-08 DIAGNOSIS — M1990 Unspecified osteoarthritis, unspecified site: Secondary | ICD-10-CM

## 2013-01-08 DIAGNOSIS — G589 Mononeuropathy, unspecified: Secondary | ICD-10-CM

## 2013-01-08 DIAGNOSIS — E7800 Pure hypercholesterolemia, unspecified: Secondary | ICD-10-CM

## 2013-01-08 MED ORDER — TORSEMIDE 20 MG PO TABS: 20.0000 mg | ORAL_TABLET | Freq: Every day | ORAL | Status: DC

## 2013-01-08 MED ORDER — ACETAZOLAMIDE 250 MG PO TABS: 250.0000 mg | ORAL_TABLET | Freq: Every day | ORAL | Status: DC

## 2013-01-08 NOTE — Progress Notes (Signed)
Subjective:    Patient ID: Jessica [name], female    DOB: [birth year], 79 y.o.   MRN: [id number]  HPI 79 y/o BF here for a follow up visit...  She has mult medical problems including:  AR & runny nose;  Asthmatic Bronchitis;  HBP;  Chronic VI & edema;  Hyperchol;  GERD & esoph stricture;  Divertics;  Hx Urinary incontinence & UTIs;  DJD/ Osteoporosis;  Semile Dementia;  Anxiety...  SEE PREV EPIC NOTES FOR EARLIER DATA >>  ~  March 25, 2012:  89mo ROV & Dayana is basically stable at 79 y/o; she notes some intermittent swelling in ankles & occas sharp CP laterally (min tender CWP)...  [name] is currently in rehab at [facility] & I doubt she will be able to cqare for him when the rehab stay is up...  Her CC is "just weak", eating OK, wt= 121#, walks w/ cane, no falls, denies SOB & manages ADLs ok...  We reviewed prob list, meds, xrays & labs>  See below>> LABS 3/13:  Chems- ok x Ca=11.1;  CBC- ok w/ Hg=13.2;  TSH=1.63;  BNP=101 We will need to check PTH in follow up...  ~  May 08, 2012:  6wk ROV & Jazzlynn returns w/ increased swelling in ankles and feet- she has known VI & periph edema that has been refractory to management w/ her attempts at low sodium diet, elevation & support hose; she also takes Lasix 20mg  starting at one Qam & increased to Bid; she notes that swelling tends to go down overnight & is worse late in the day; she has lost 3# to 118# since last OV yet she is very concerned & quite perturbed about the edema...  We discussed the need to check CXR & labs; we will change the Lasix to Demadex 20mg - 2 Qam & follow up 1 month... CXR 5/13 showed borderline heart size, kyphoscoliosis, no edema, and clear lungs... LABS 5/13:  Pending ==> she forgot to go to the lab this day.  ~  June 23, 2012:  6wk ROV & Nohea's edema is diminished on the Demadex20mg - 2 daily but she prefers to split it up 1AM & 1afternoon due to urination; she has mult additional symptoms, somatic complaints, etc> eg- c/o trouble w/ her  ears w/ a noise "not a ringing, it' a frying" & noted less when active, distracted w/ background noise etc (we discussed tinnitus & masking the sound w/ "white noise");  She also wants additional Physical therapy & balance retraining from [facility]...   daugh notes [name] is still in [place] but they may send him home soon...    We reviewed prob list, meds, xrays and labs> see below>> LABS 7/13:  Chems- HCO3=39, BUN=26, Creat=1.2, Ca=11.8, PTH=81... Rec- add DIAMOX 250mg /d, no calc supplements etc...  ~  July 23, 2012:  6mo ROV & Monnie is doing the best she can at home w/ daughter's help but they have a hard time w/ her meds & pt's dementia... After last visit she had more "therapy" but this too has stopped since she really "couldn't do it" per daughter... Edema has decr but persists in ankles on Demadex20-2/d (?if taking) & Diamox250/d added; note that weight is the same at 116-117# today...     We reviewed prob list, meds, xrays and labs> see below for update >> We decided to simplify her regimen> Norvasc1/2, Lisinopril1/2, Demadex- one daily, Diamox- one daily...  ~  August 25, 2012:  6mo ROV & Arina states "  Sometimes I feel like I'm doing better" and "today is not one of my worst days"; weight is down 2-3# to 115# today on her Demadex20 & Diamox250; she notes incr stress because [name] is back home & there is a lot on her- son comes in early AM hours, then a lady helps during the day, daugh in the afternoon, meals on wheels, etc...     VI, Edema> feet are still swelling, on Demadex20, Diamox250, no salt/ elevation/ support hose/ etc; labs showed stable renal w/ Cr=1.1 but K=3.4 & we will start K10Bid...    Hypercalcemia> repeat Ca=11.0 (sl improved) and PTH July2013 was 81; we are folowing & plan repeat PTH in 4-11082mo; she knows to avoid calcium supplements & stop VitD as well.    Dementia> she is very slow & deliberate; I continue to feel that she & husb need NHP vs AL etc but they  refuse to consider alternative living arrangements... We reviewed prob list, meds, xrays and labs> see below for updates >> LABS 9/13:  Chems- ok w/ K=3.4 (adding K10Bid), BS=90, BUN=17 Cr=1.1, Ca=11.0;  CBC- ok w/ Hg=12.5  ~  October 27, 2012:  20mo ROV & post hosp check> Faelyn was [facility] 10/26 - 10/15/12 by [facility] w/ weakness, poor appetite & intake, electrolyte abn (K=3.3, Ca=10.1, Phos=1.8) & prot/cal malnutrition;  She responded to IV therapy & was covered w/ Rocephin=>Cipro, Urine grew mult species;  She went to [facility] for rehab & is still there- it is clear that pt & husb cannot care for self & family unable to provide adequately, but finances preclude NHP for pt & husb... She notes feeling better, strength better, PT helping...    <HBP> on ASA81, Amlod2.5, Lisin5, Demadex20-1/2, Diamox250, & K20/d; BP=122/64 & she is feeling better, still w/ mult somatic complaints...    <VI> she knows to elim sodium, elev legs, wear support hose, take the diuretics- we decided to incr Demadex back to 20mg /d...    <Chol> on diet alone; she has a very hi HDL ~150!!!    <Hypercalcemia> assoc w/ low phos- r/o hyperparathyroidism- see below...    <GI- GERD, Divertics> on Prilosec20, Miralax, Senakot-S - all as needed...    <GU- saw DrTannenbaum for Urology 10/12- mult complaints, urodynamics done w/ sm hypersens bladder unstable w/ leakage, & large bladder divertic> he rec double voiding technique...    <DJD, Osteopenia> off Ca supplements they sent her home from hosp w/ Phos-2Bid...    <Senile Dementia> on Aricept10 & Alpraz0.25 as needed... We reviewed prob list, meds, xrays and labs> see below for updates >>  LABS 11/13:  Chems- ok w/ K=3.5 Ca=10.5 Phos=2.6 (2.3-4.6) Mg=2.1.Marland Kitchen.      ~  December 03, 2012:  1082mo ROV & Jermesha's CC= swelling in feet; her wt= 114# up 1# on Demadex20, Diamox250, K10-3/d, and KPhos-Bid; reminded to elim sodium, elev legs, wear support hose...  She is under a lot of stress w/ [place]  home, son helps but works...      We reviewed prob list, meds, xrays and labs> see below for updates >>  LABS 12/13:  Chems- ok w/ K=3.5 Creat=1.1 Ca=10.8 Phos=3.6 PTH=122 (c/w hyperparathyroidism)...  We decided to continue her same meds for now...  ~  January 08, 2013:  1082mo ROV & Mattingly continues to insist on monthly ROVs- c/o pedal edema about the same as prev, wt stable 114# despite running out of diuretics & we discussed refilling her Demadex20 & Diamox250;  BP stable, O2 sats  are 99% on RA, chest clear, etc...  She persists w/ bladder complaints and she is reminded about the double voiding technique per DrTannenbaum & rec for Urology f/u prn...         Problem List:  Hx of ALLERGIES (ICD-995.3) - she uses OTC meds as needed, & we prev discussed Zyrtek 10mg Qam, Saline nasal mist, & Atrovent Nasal 0.03% Tid for drainage (one of her chronic complaints)...  Hx of ASTHMATIC BRONCHITIS, ACUTE (ICD-466.0) - no recent exacerbations... ~  CXR 5/13 showed borderline heart size, kyphoscoliosis, no edema, and clear lungs... ~  CXR 10/13 showed normal heart size, clear lungs, kyphoscoliosis, osteopenia...  HYPERTENSION (ICD-401.9) - controlled on ASA 81mg /d, NORVASC5mg -1/2tab, LISINOPRIL5 & DEMADEX 20mg /d & DIAMOX250mg /d... ~  4/12:  BP= 110/60 and she states that she is taking her meds regularly & tolerating them well... denies HA, visual changes, CP, palpit, change in dyspnea, etc... but notes weakness, intermittently dizzy & persist edema in her feet...  ~  6/12:  BP= 124/70 & she remains stable... ~  8/12:  BP= 110/ 66 & stable... ~  10/12:  BP= 114/62 & stable... ~  1/13:  BP= 110/64 & she remains stable... ~  4/13:  BP= 116/58 & she is reminded to adjust Lasix 1-2 Qam according to her edema... ~  5/13:  BP= 140/72 & she is c/o pedal edema; we decided to change to Demadex 20mg - 2Qam. ~  7/13:  BP= 110/60 & her edema is diminished, still 1+ in feet... Labs showed HCO3=39 & DIAMOX 250mg /d  added. ~  8/13:  BP= 118/74 & edema is the same; ?what she's taking? & we decided to simplify regimen> Norvasc1/2, Lisin1/2, Demadex-1/d & Diamox-1/d... ~  9/13:  BP= 110/70 & she is clinically stable on these meds; add K10Bid for the K=3.4 today... ~  EKG 10/13 showed NSR, rate63, RBBB, poss old infarct anterolaterally... ~  11/13:  on ASA81, Amlod2.5, Lisin5, Demadex20-1/2, Diamox250, & K20/d; BP=122/64 & she is feeling better, still w/ mult somatic complaints... ~  12/13:  BP= 124/72 & her edema, weight, etc are about stable... ~  1/14:  B{P= 118/60 & she persists w/ mult somatic complaints...  VENOUS INSUFFICIENCY, CHRONIC (ICD-459.81) - there is mild pedal edema bilat L>R... on low sodium diet, elevation, support hose- prev refused diuretic meds due to urinary symptoms, now on DEMADEX 20mg   HYPERCHOLESTEROLEMIA (ICD-272.0) - prev on Lip10, but she stopped this on her own... she has a very high HDL!!! ~  FLP was 1/08 showed TChol 253, TG 125, HDL 121, LDL 93 ~  FLP 2/09 showed TChol 200, TG 99, HDL 136, LDL 44 ~  FLP 4/11 showed TChol 244, TG 143, HDL 149, LDL 59 ~  FLP 1/12 showed TChol 266, TG 102, HDL 151, LDL 83 >> BEST HDL I'VE EVER SEEN  HYPERCALCEMIA >> routine labs w/ Calcium levels betw 10.2 & 11.7 over the last 4yrs... ~  Labs 7/13 showed Ca= 11.8 &  PTH= 81... For now avoid calc supplements etc... ~  Labs 9/13 showed Ca= 11.0 & she is reminded- no calcium, no VitD... ~  Labs 10/13 Hosp reviewed> Ca was up, Phos was low, K was low> all improved w/ Rx in hosp (they did not repeat PTH level).  GERD (ICD-530.81) - last EGD was 9/05 by DrPerry showing GERD and esoph stricture- dilated... she stopped her Nexium but states that her swallowing is good & she uses PRILOSEC OTC as needed.  DIVERTICULOSIS OF COLON (ICD-562.10) - last  colonoscopy was 11/00 showing divertics only... ~  8/12:  She notes some constipation & rec to take [facility] & SENAKOT-S per our usual protocol...  HX,  URINARY INFECTION (ICD-V13.02) & INCONTINENCE symptoms... she has seen DrTannenbaum & staff on bladder exercises and she reported some improvement but has persistant symptoms & never followed up... ~  7/10:  offered f/u appt w/ Urology vs second opinion consult but she declines... ~  9/10:  wrote for trial Enablex 7.5mg /d >> no benefit she says. ~  12/10: saw DrNesi- tried sample med, sl improved, but "he turned me over to a lady for longer term treatments" she says... ~  4/12 & 6/12: states DrNesi hasn't been able to help her & she wants appt w/ DrTannenbaum==> seen w/ urodynamic eval revealing a sm hypersens bladder & a large bladder divertic; not a surg candidate, taught double voiding technique... ~  10/12: she had f/u DrTannenbaum & he reinforced prev w/u & need for double voiding technique... ~  10/13: In hosp Urine grew mult species, Rx w/ Roceph=> Cipro...  DEGENERATIVE JOINT DISEASE (ICD-715.90) - notes right shoulder symptoms, but managing OK w/ Mobic 7.5mg  & Tylenol; also right hip & low back pain...  she also c/o neuropathic "burning" in legs but not bad enough for additional meds she says...  OSTEOPOROSIS (ICD-733.00) - she was on Fosamax but had discontinued this on her own... we discussed the need for continued therapy and she was asked to restart Alendronate but she never did...  she takes Ca++, MVI, VitD==> rec to STOP the Calcium.  SENILE DEMENTIA (ICD-290.0) - she takes ASA 81mg /d... she tried Aricept in the past but didn't notice any improvement therefore stopped it. ~  she & her husb still live on their own but are having numerous difficulties while still resisting any thoughts of assisted living etc> got concerned because she saw an impulse in her left antecubital fossa (tortuous brachial art), went to the ER & this got translated to palpitations & lead to full ER cardiac eval= neg... ~  6/11: MMSE showed 26/30 & she agreed to try DONEPEZIL 10mg /d... ~  6/12:  Still taking  Aricept & appears stable... ~  7/13:  No change, same meds, clinically stable...  ANXIETY (ICD-300.00) - she uses Alpraz 0.25mg  as needed.  Hx of SHINGLES (ICD-053.9)   Past Surgical History  Procedure Date  . Vesicovaginal fistula closure w/ tah   . Tonsillectomy   . Abdominal hysterectomy     Outpatient Encounter Prescriptions as of 01/08/2013  Medication Sig Dispense Refill  . ALPRAZolam (XANAX) 0.25 MG tablet Take 0.125-0.25 mg by mouth 3 (three) times daily as needed. For nerves      . amLODipine (NORVASC) 5 MG tablet Take 2.5 mg by mouth every morning.      Marland Kitchen aspirin EC 81 MG tablet Take 81 mg by mouth daily.      . feeding supplement (ENSURE COMPLETE) LIQD Take 237 mLs by mouth 2 (two) times daily between meals.  60 Bottle  0  . lisinopril (PRINIVIL,ZESTRIL) 5 MG tablet Take 1 tablet (5 mg total) by mouth daily.      . Multiple Vitamin (MULTIVITAMIN) tablet Take 1 tablet by mouth daily.        . phosphorus (K PHOS NEUTRAL) 155-852-130 MG tablet Take 1 tablet by mouth 2 (two) times daily.  60 tablet  11  . potassium chloride (K-DUR) 10 MEQ tablet in the morning and at night.      Marland Kitchen  torsemide (DEMADEX) 20 MG tablet Take 1 tablet (20 mg total) by mouth daily.  30 tablet  11  . [DISCONTINUED] torsemide (DEMADEX) 20 MG tablet Take 1 tablet (20 mg total) by mouth daily.  30 tablet  11  . acetaZOLAMIDE (DIAMOX) 250 MG tablet Take 1 tablet (250 mg total) by mouth daily. At 4 pm  30 tablet  11  . [DISCONTINUED] acetaZOLAMIDE (DIAMOX) 250 MG tablet Take 250 mg by mouth daily. At 4 pm        No Known Allergies   Current Medications, Allergies, Past Medical History, Past Surgical History, Family History, and Social History were reviewed in [employer] record.    Review of Systems        See HPI - all other systems neg except as noted...      The patient complains of decreased hearing, dyspnea on exertion, peripheral edema, incontinence, muscle  weakness, and difficulty walking.  The patient denies anorexia, fever, weight loss, weight gain, vision loss, hoarseness, chest pain, syncope, prolonged cough, headaches, hemoptysis, abdominal pain, melena, hematochezia, severe indigestion/heartburn, hematuria, suspicious skin lesions, transient blindness, depression, unusual weight change, abnormal bleeding, enlarged lymph nodes, and angioedema.     Objective:   Physical Exam      WD, WN, 79 y/o BF in NAD... GENERAL:  Alert, pleasant & cooperative; she is slow moving as noted... HEENT:  [place]/AT, EOM-full, EACs- some wax, TMs-wnl, NOSE-clear, THROAT-clear & wnl. NECK:  Supple w/ fairROM; no JVD; normal carotid impulses w/o bruits; no thyromegaly or nodules palpated; no lymphadenopathy. CHEST:  Clear to P & A; without wheezes/ rales/ or rhonchi., noted kyphosis... HEART:  Regular Rhythm; without murmurs/ rubs/ or gallops heard... ABDOMEN:  Soft & nontender; normal bowel sounds; no organomegaly or masses detected. EXT:  mod arthritic changes; no varicose veins/ +venous insuffic/ 1-2+edema... NEURO:   Alert, answers questions appropriately, but slowly, no focal neuro deficits... DERM:  No lesions noted; no rash etc...  MMSE> 06/13/10> Score 26/30 (missed place, 2/3 recall after distraction, & copy figure)...   Assessment & Plan:    AR & ASTHMA>  Breathing is stable, at baseline, she has chr rhinorrhea complaints w/ numerous discussions regarding management of this problem....  HBP>  Controlled on meds, continue same... Chems prebv showed HCO3=39 on the Demadex, added DIAMOX 250mg /d & improved to 31...  Electrolye Abn>  See 10/13 Hosp by [facility], I am suspicious that her elev calcium is the result of HYPER-PARATHYROIDISM.Marland KitchenMarland Kitchen  Ven insuffic>  Her CC is pedal edema, she does not grasp the idea of ven insuffic but says she's not eating salt; rec elevation, support hose & continue Demadex20mg Qam & Diamox250Qpm.  CHOL>  She has the world's best  HDL!!!  Hypercalcemia>  Surely from Hyperpara & she has stopped the calcium supplements, & we are following the labs...  GU>  She saw DrTannenbaum w/ extensive investigation into her voiding symptoms & urodynamics as above; rec to use "double voiding" technique...  DJD/ Osteopenia>  Aware, she is stable on OTC meds, she has repeatedly refuse Bisphos meds for her bones...  Dementia>  She & husb [name] are still living independently & have been married 62 yrs.  Other medical problems as noted...   Patient's Medications  New Prescriptions   No medications on file  Previous Medications   ALPRAZOLAM (XANAX) 0.25 MG TABLET    Take 0.125-0.25 mg by mouth 3 (three) times daily as needed. For nerves   AMLODIPINE (NORVASC) 5 MG TABLET  Take 2.5 mg by mouth every morning.   ASPIRIN EC 81 MG TABLET    Take 81 mg by mouth daily.   FEEDING SUPPLEMENT (ENSURE COMPLETE) LIQD    Take 237 mLs by mouth 2 (two) times daily between meals.   LISINOPRIL (PRINIVIL,ZESTRIL) 5 MG TABLET    Take 1 tablet (5 mg total) by mouth daily.   MULTIPLE VITAMIN (MULTIVITAMIN) TABLET    Take 1 tablet by mouth daily.     PHOSPHORUS (K PHOS NEUTRAL) 155-852-130 MG TABLET    Take 1 tablet by mouth 2 (two) times daily.   POTASSIUM CHLORIDE (K-DUR) 10 MEQ TABLET    in the morning and at night.  Modified Medications   Modified Medication Previous Medication   ACETAZOLAMIDE (DIAMOX) 250 MG TABLET acetaZOLAMIDE (DIAMOX) 250 MG tablet      Take 1 tablet (250 mg total) by mouth daily. At 4 pm    Take 250 mg by mouth daily. At 4 pm   TORSEMIDE (DEMADEX) 20 MG TABLET torsemide (DEMADEX) 20 MG tablet      Take 1 tablet (20 mg total) by mouth daily.    Take 1 tablet (20 mg total) by mouth daily.  Discontinued Medications   No medications on file

## 2013-02-16 ENCOUNTER — Ambulatory Visit: Payer: Self-pay | Admitting: Pulmonary Disease

## 2013-03-16 ENCOUNTER — Telehealth: Payer: Self-pay | Admitting: Pulmonary Disease

## 2013-03-16 NOTE — Telephone Encounter (Signed)
atc pt NA unable to leave [facility]

## 2013-03-17 NOTE — Telephone Encounter (Signed)
Spoke with pt to see what meds she is having diff getting refilled.  Pt unsure of what meds she needs.  Instructed pt to have nurse that comes to house to call our office to discuss meds to see what pt needs.  Pr also has appt with Dr Kriste Basque on 03-23-13 and was instructed to bring meds to discuss.

## 2013-03-23 ENCOUNTER — Ambulatory Visit (INDEPENDENT_AMBULATORY_CARE_PROVIDER_SITE_OTHER): Payer: Medicare Other | Admitting: Pulmonary Disease

## 2013-03-23 ENCOUNTER — Other Ambulatory Visit (INDEPENDENT_AMBULATORY_CARE_PROVIDER_SITE_OTHER): Payer: Medicare Other

## 2013-03-23 ENCOUNTER — Encounter: Payer: Self-pay | Admitting: Pulmonary Disease

## 2013-03-23 VITALS — BP 100/60 | HR 57 | Temp 98.0°F | Ht 60.0 in | Wt 117.0 lb

## 2013-03-23 DIAGNOSIS — E7800 Pure hypercholesterolemia, unspecified: Secondary | ICD-10-CM

## 2013-03-23 DIAGNOSIS — R634 Abnormal weight loss: Secondary | ICD-10-CM

## 2013-03-23 DIAGNOSIS — I872 Venous insufficiency (chronic) (peripheral): Secondary | ICD-10-CM

## 2013-03-23 DIAGNOSIS — R609 Edema, unspecified: Secondary | ICD-10-CM

## 2013-03-23 DIAGNOSIS — K219 Gastro-esophageal reflux disease without esophagitis: Secondary | ICD-10-CM

## 2013-03-23 DIAGNOSIS — R21 Rash and other nonspecific skin eruption: Secondary | ICD-10-CM

## 2013-03-23 DIAGNOSIS — I10 Essential (primary) hypertension: Secondary | ICD-10-CM

## 2013-03-23 DIAGNOSIS — K5730 Diverticulosis of large intestine without perforation or abscess without bleeding: Secondary | ICD-10-CM

## 2013-03-23 DIAGNOSIS — F0390 Unspecified dementia without behavioral disturbance: Secondary | ICD-10-CM

## 2013-03-23 DIAGNOSIS — G589 Mononeuropathy, unspecified: Secondary | ICD-10-CM

## 2013-03-23 DIAGNOSIS — M1990 Unspecified osteoarthritis, unspecified site: Secondary | ICD-10-CM

## 2013-03-23 DIAGNOSIS — I4510 Unspecified right bundle-branch block: Secondary | ICD-10-CM

## 2013-03-23 DIAGNOSIS — F411 Generalized anxiety disorder: Secondary | ICD-10-CM

## 2013-03-23 DIAGNOSIS — R5381 Other malaise: Secondary | ICD-10-CM

## 2013-03-23 DIAGNOSIS — M810 Age-related osteoporosis without current pathological fracture: Secondary | ICD-10-CM

## 2013-03-23 LAB — BASIC METABOLIC PANEL
BUN: 19 mg/dL (ref 6–23)
Chloride: 105 mEq/L (ref 96–112)
GFR: 59.43 mL/min — ABNORMAL LOW (ref >60.00)
Potassium: 3.7 mEq/L (ref 3.5–5.1)
Sodium: 142 mEq/L (ref 135–145)

## 2013-03-23 LAB — HEPATIC FUNCTION PANEL
AST: 23 U/L (ref 0–37)
Albumin: 4 g/dL (ref 3.5–5.2)
Alkaline Phosphatase: 45 U/L (ref 39–117)
Bilirubin, Direct: 0.1 mg/dL (ref 0.0–0.3)
Total Bilirubin: 0.6 mg/dL (ref 0.3–1.2)

## 2013-03-23 LAB — PHOSPHORUS: Phosphorus: 3.4 mg/dL (ref 2.3–4.6)

## 2013-03-23 MED ORDER — CLOTRIMAZOLE-BETAMETHASONE 1-0.05 % EX CREA: TOPICAL_CREAM | Freq: Two times a day (BID) | CUTANEOUS | Status: DC

## 2013-03-23 MED ORDER — HYDROXYZINE HCL 25 MG PO TABS: 25.0000 mg | ORAL_TABLET | ORAL | Status: DC | PRN

## 2013-03-23 NOTE — Progress Notes (Signed)
Subjective:    Patient ID: Jessica [name], female    DOB: Aug 30, 1911, 79 y.o.   MRN: [id number]  HPI 79 y/o BF here for a follow up visit...  She has mult medical problems including:  AR & runny nose;  Asthmatic Bronchitis;  HBP;  Chronic VI & edema;  Hyperchol;  GERD & esoph stricture;  Divertics;  Hx Urinary incontinence & UTIs;  DJD/ Osteoporosis;  Semile Dementia;  Anxiety...  SEE PREV EPIC NOTES FOR EARLIER DATA >>  ~  March 25, 2012:  60mo ROV & Jessica [name] is basically stable at 79 y/o; she notes some intermittent swelling in ankles & occas sharp CP laterally (min tender CWP)...  [name] is currently in rehab at [place] & I doubt she will be able to cqare for him when the rehab stay is up...  Her CC is "just weak", eating OK, wt= 121#, walks w/ cane, no falls, denies SOB & manages ADLs ok...  We reviewed prob list, meds, xrays & labs>  See below>> LABS 3/13:  Chems- ok x Ca=11.1;  CBC- ok w/ Hg=13.2;  TSH=1.63;  BNP=101 We will need to check PTH in follow up...  ~  May 08, 2012:  6wk ROV & Jessica [name] returns w/ increased swelling in ankles and feet- she has known VI & periph edema that has been refractory to management w/ her attempts at low sodium diet, elevation & support hose; she also takes Lasix 20mg  starting at one Qam & increased to Bid; she notes that swelling tends to go down overnight & is worse late in the day; she has lost 3# to 118# since last OV yet she is very concerned & quite perturbed about the edema...  We discussed the need to check CXR & labs; we will change the Lasix to Demadex 20mg - 2 Qam & follow up 1 month... CXR 5/13 showed borderline heart size, kyphoscoliosis, no edema, and clear lungs... LABS 5/13:  Pending ==> she forgot to go to the lab this day.  ~  June 23, 2012:  6wk ROV & Jessica [name] edema is diminished on the Demadex20mg - 2 daily but she prefers to split it up 1AM & 1afternoon due to urination; she has mult additional symptoms, somatic complaints, etc> eg- c/o trouble w/ her  ears w/ a noise "not a ringing, it' a frying" & noted less when active, distracted w/ background noise etc (we discussed tinnitus & masking the sound w/ "white noise");  She also wants additional Physical therapy & balance retraining from [facility]...   daugh notes [name] is still in [place] but they may send him home soon...    We reviewed prob list, meds, xrays and labs> see below>> LABS 7/13:  Chems- HCO3=39, BUN=26, Creat=1.2, Ca=11.8, PTH=81... Rec- add DIAMOX 250mg /d, no calc supplements etc...  ~  July 23, 2012:  66mo ROV & Jessica [name] is doing the best she can at home w/ daughter's help but they have a hard time w/ her meds & pt's dementia... After last visit she had more "therapy" but this too has stopped since she really "couldn't do it" per daughter... Edema has decr but persists in ankles on Demadex20-2/d (?if taking) & Diamox250/d added; note that weight is the same at 116-117# today...     We reviewed prob list, meds, xrays and labs> see below for update >> We decided to simplify her regimen> Norvasc1/2, Lisinopril1/2, Demadex- one daily, Diamox- one daily...  ~  August 25, 2012:  66mo ROV & Jessica [name] states "  Sometimes I feel like I'm doing better" and "today is not one of my worst days"; weight is down 2-3# to 115# today on her Demadex20 & Diamox250; she notes incr stress because [name] is back home & there is a lot on her- son comes in early AM hours, then a lady helps during the day, daugh in the afternoon, meals on wheels, etc...     VI, Edema> feet are still swelling, on Demadex20, Diamox250, no salt/ elevation/ support hose/ etc; labs showed stable renal w/ Cr=1.1 but K=3.4 & we will start K10Bid...    Hypercalcemia> repeat Ca=11.0 (sl improved) and PTH July2013 was 81; we are folowing & plan repeat PTH in 4-4mo; she knows to avoid calcium supplements & stop VitD as well.    Dementia> she is very slow & deliberate; I continue to feel that she & husb need NHP vs AL etc but they  refuse to consider alternative living arrangements... We reviewed prob list, meds, xrays and labs> see below for updates >> LABS 9/13:  Chems- ok w/ K=3.4 (adding K10Bid), BS=90, BUN=17 Cr=1.1, Ca=11.0;  CBC- ok w/ Hg=12.5  ~  October 27, 2012:  8mo ROV & post hosp check> Jessica [name] was [facility] 10/26 - 10/15/12 by [facility] w/ weakness, poor appetite & intake, electrolyte abn (K=3.3, Ca=10.1, Phos=1.8) & prot/cal malnutrition;  She responded to IV therapy & was covered w/ Rocephin=>Cipro, Urine grew mult species;  She went to [facility] for rehab & is still there- it is clear that pt & husb cannot care for self & family unable to provide adequately, but finances preclude NHP for pt & husb... She notes feeling better, strength better, PT helping...    HBP> on ASA81, Amlod2.5, Lisin5, Demadex20-1/2, Diamox250, & K20/d; BP=122/64 & she is feeling better, still w/ mult somatic complaints...    VI> she knows to elim sodium, elev legs, wear support hose, take the diuretics- we decided to incr Demadex back to 20mg /d...    Chol> on diet alone; she has a very hi HDL ~150!!!    Hypercalcemia> assoc w/ low phos- r/o hyperparathyroidism- see below...    GI- GERD, Divertics> on Prilosec20, Miralax, Senakot-S - all as needed...    GU- saw DrTannenbaum for Urology 10/12- mult complaints, urodynamics done w/ sm hypersens bladder unstable w/ leakage, & large bladder divertic> he rec double voiding technique...    DJD, Osteopenia> off Ca supplements they sent her home from hosp w/ Phos-2Bid...    Senile Dementia> on Aricept10 & Alpraz0.25 as needed... We reviewed prob list, meds, xrays and labs> see below for updates >>  LABS 11/13:  Chems- ok w/ K=3.5 Ca=10.5 Phos=2.6 (2.3-4.6) Mg=2.1.Marland Kitchen.      ~  December 03, 2012:  80mo ROV & Jessica [name] CC= swelling in feet; her wt= 114# up 1# on Demadex20, Diamox250, K10-3/d, and KPhos-Bid; reminded to elim sodium, elev legs, wear support hose...  She is under a lot of stress w/ [place] home, son  helps but works...      We reviewed prob list, meds, xrays and labs> see below for updates >>  LABS 12/13:  Chems- ok w/ K=3.5 Creat=1.1 Ca=10.8 Phos=3.6 PTH=122 (c/w hyperparathyroidism)...  We decided to continue her same meds for now...  ~  January 08, 2013:  80mo ROV & Jessica [name] continues to insist on monthly ROVs- c/o pedal edema about the same as prev, wt stable 114# despite running out of diuretics & we discussed refilling her Demadex20 & Diamox250;  BP stable, O2 sats  are 99% on RA, chest clear, etc...  She persists w/ bladder complaints and she is reminded about the double voiding technique per DrTannenbaum & rec for Urology f/u prn...  ~  March 23, 2013:  2-72mo ROV & Jessica [name] is c/o ankle swelling which tends to go down overnight- we discussed stopping her Amlodipine & redoubling efforts at no salt, elevation, support hose, & Demadex;  also c/o fine rash ?generalized w/ pruritis & we discussed Rx w/ Lotrisone cream & Atarax 25mg  prn... She is once again asking for yet another round of home PT therapy...  We reviewed the following medical problems during today's office visit >>     HBP> on ASA81, Amlod2.5, Lisin5, Demadex20-1/2, Diamox250, & K10-3/d + KPhos-1Bid; BP=100/62 & she is feeling sl weak w/ mult somatic complaints; labs 4/14 show K=3.7, Phos=3.4, Cr=1.1; we decided to STOP the Amlodipine & work on sodium restriction.    VI, edema> she knows to elim sodium, elev legs, wear support hose, take Demadex 20mg /d...    Chol> on diet alone; she has a very hi HDL ~150!!!    Hypercalcemia> assoc w/ low phos- c/w primary hyperparathyroidism- she is 79y/o & not felt to be an operative candidate; she was started on KPhos 10/13 in [facility] & disch on 2Bid w/ improvement in serum Phos to 3.6; serum Ca = 11.2 now but PTH is not rising.    GI- GERD, Divertics> off prev Prilosec20, Miralax, Senakot-S; advised these all as needed...    GU> saw DrTannenbaum for Urology 10/12- mult complaints, urodynamics done w/ sm  hypersens bladder unstable w/ leakage, & large bladder divertic> he rec double voiding technique...    DJD, Osteopenia> off Ca supplements they sent her home from hosp w/ Phos-2Bid...    Senile Dementia> on Aricept10 & Alpraz0.25 as needed... We reviewed prob list, meds, xrays and labs> see below for updates >>  LABS 4/14:  Chems- Ca=11.2 but PTH is sl lower than prev at 86;  Phos=3.4 on KPhos1tabBid; K= 3.7 on this & K10- 2AM+1PM;  LFTs= wnl and Alb= 4.0 on Ensure...        Problem List:  Hx of ALLERGIES (ICD-995.3) - she uses OTC meds as needed, & we prev discussed Zyrtek 10mg Qam, Saline nasal mist, & Atrovent Nasal 0.03% Tid for drainage (one of her chronic complaints)...  Hx of ASTHMATIC BRONCHITIS, ACUTE (ICD-466.0) - no recent exacerbations... ~  CXR 5/13 showed borderline heart size, kyphoscoliosis, no edema, and clear lungs... ~  CXR 10/13 showed normal heart size, clear lungs, kyphoscoliosis, osteopenia...  HYPERTENSION (ICD-401.9) - controlled on ASA 81mg /d, NORVASC5mg -1/2tab, LISINOPRIL5 & DEMADEX 20mg /d & DIAMOX250mg /d... ~  4/12:  BP= 110/60 and she states that she is taking her meds regularly & tolerating them well... denies HA, visual changes, CP, palpit, change in dyspnea, etc... but notes weakness, intermittently dizzy & persist edema in her feet...  ~  6/12:  BP= 124/70 & she remains stable... ~  8/12:  BP= 110/ 66 & stable... ~  10/12:  BP= 114/62 & stable... ~  1/13:  BP= 110/64 & she remains stable... ~  4/13:  BP= 116/58 & she is reminded to adjust Lasix 1-2 Qam according to her edema... ~  5/13:  BP= 140/72 & she is c/o pedal edema; we decided to change to Demadex 20mg - 2Qam. ~  7/13:  BP= 110/60 & her edema is diminished, still 1+ in feet... Labs showed HCO3=39 & DIAMOX 250mg /d added. ~  8/13:  BP= 118/74 & edema is  the same; ?what she's taking? & we decided to simplify regimen> Norvasc1/2, Lisin1/2, Demadex-1/d & Diamox-1/d... ~  9/13:  BP= 110/70 & she is clinically  stable on these meds; add K10Bid for the K=3.4 today... ~  EKG 10/13 showed NSR, rate63, RBBB, poss old infarct anterolaterally... ~  11/13:  on ASA81, Amlod2.5, Lisin5, Demadex20-1/2, Diamox250, & K20/d; BP=122/64 & she is feeling better, still w/ mult somatic complaints... ~  12/13:  BP= 124/72 & her edema, weight, etc are about stable... ~  1/14:  BP= 118/60 & she persists w/ mult somatic complaints... ~  4/14:  on ASA81, Amlod2.5, Lisin5, Demadex20-1/2, Diamox250, & K10-3/d + KPhos-1Bid; BP=100/62 & she is feeling sl weak w/ mult somatic complaints; labs 4/14 show K=3.7, Phos=3.4, Cr=1.1; we decided to STOP the Amlodipine & work on sodium restriction  VENOUS INSUFFICIENCY, CHRONIC (ICD-459.81) - there is mild pedal edema bilat L>R... on low sodium diet, elevation, support hose- prev refused diuretic meds due to urinary symptoms, now on DEMADEX 20mg   HYPERCHOLESTEROLEMIA (ICD-272.0) - prev on Lip10, but she stopped this on her own... she has a very high HDL!!! ~  FLP was 1/08 showed TChol 253, TG 125, HDL 121, LDL 93 ~  FLP 2/09 showed TChol 200, TG 99, HDL 136, LDL 44 ~  FLP 4/11 showed TChol 244, TG 143, HDL 149, LDL 59 ~  FLP 1/12 showed TChol 266, TG 102, HDL 151, LDL 83 >> BEST HDL I'VE EVER SEEN  HYPERCALCEMIA >> routine labs w/ Calcium levels betw 10.2 & 11.7 over the last 69yrs... ~  Labs 7/13 showed Ca= 11.8 &  PTH= 81... For now avoid calc supplements etc... ~  Labs 9/13 showed Ca= 11.0 & she is reminded- no calcium, no VitD... ~  Labs 10/13 Hosp reviewed> Ca was up, Phos was low, K was low> all improved w/ Rx in hosp (they did not repeat PTH level). ~  4/14:   assoc w/ low phos- c/w primary hyperparathyroidism- she is 79y/o & not felt to be an operative candidate; she was started on KPhos 10/13 in [facility] & disch on 2Bid w/ improvement in serum Phos to 3.6; serum Ca = 11.2 now but PTH is not rising.   GERD (ICD-530.81) - last EGD was 9/05 by DrPerry showing GERD and esoph stricture-  dilated... she stopped her Nexium but states that her swallowing is good & she uses PRILOSEC OTC as needed.  DIVERTICULOSIS OF COLON (ICD-562.10) - last colonoscopy was 11/00 showing divertics only... ~  8/12:  She notes some constipation & rec to take [facility] & SENAKOT-S per our usual protocol...  HX, URINARY INFECTION (ICD-V13.02) & INCONTINENCE symptoms... she has seen DrTannenbaum & staff on bladder exercises and she reported some improvement but has persistant symptoms & never followed up... ~  7/10:  offered f/u appt w/ Urology vs second opinion consult but she declines... ~  9/10:  wrote for trial Enablex 7.5mg /d >> no benefit she says. ~  12/10: saw DrNesi- tried sample med, sl improved, but "he turned me over to a lady for longer term treatments" she says... ~  4/12 & 6/12: states DrNesi hasn't been able to help her & she wants appt w/ DrTannenbaum==> seen w/ urodynamic eval revealing a sm hypersens bladder & a large bladder divertic; not a surg candidate, taught double voiding technique... ~  10/12: she had f/u DrTannenbaum & he reinforced prev w/u & need for double voiding technique... ~  10/13: In hosp Urine grew mult species, Rx  w/ Roceph=> Cipro...  DEGENERATIVE JOINT DISEASE (ICD-715.90) - notes right shoulder symptoms, but managing OK w/ Mobic 7.5mg  & Tylenol; also right hip & low back pain...  she also c/o neuropathic "burning" in legs but not bad enough for additional meds she says...  OSTEOPOROSIS (ICD-733.00) - she was on Fosamax but had discontinued this on her own... we discussed the need for continued therapy and she was asked to restart Alendronate but she never did...  she takes Ca++, MVI, VitD==> rec to STOP the Calcium.  SENILE DEMENTIA (ICD-290.0) - she takes ASA 81mg /d... she tried Aricept in the past but didn't notice any improvement therefore stopped it. ~  she & her husb still live on their own but are having numerous difficulties while still resisting any thoughts  of assisted living etc> got concerned because she saw an impulse in her left antecubital fossa (tortuous brachial art), went to the ER & this got translated to palpitations & lead to full ER cardiac eval= neg... ~  6/11: MMSE showed 26/30 & she agreed to try DONEPEZIL 10mg /d... ~  6/12:  Still taking Aricept & appears stable... ~  7/13:  No change, same meds, clinically stable...  ANXIETY (ICD-300.00) - she uses Alpraz 0.25mg  as needed.  Hx of SHINGLES (ICD-053.9)   Past Surgical History  Procedure Laterality Date  . Vesicovaginal fistula closure w/ tah    . Tonsillectomy    . Abdominal hysterectomy      Outpatient Encounter Prescriptions as of 03/23/2013  Medication Sig Dispense Refill  . acetaZOLAMIDE (DIAMOX) 250 MG tablet Take 1 tablet (250 mg total) by mouth daily. At 4 pm  30 tablet  11  . ALPRAZolam (XANAX) 0.25 MG tablet Take 0.125-0.25 mg by mouth 3 (three) times daily as needed. For nerves      . amLODipine (NORVASC) 5 MG tablet Take 2.5 mg by mouth every morning.      Marland Kitchen aspirin EC 81 MG tablet Take 81 mg by mouth daily.      . feeding supplement (ENSURE COMPLETE) LIQD Take 237 mLs by mouth 2 (two) times daily between meals.  60 Bottle  0  . lisinopril (PRINIVIL,ZESTRIL) 5 MG tablet Take 1 tablet (5 mg total) by mouth daily.      . Multiple Vitamin (MULTIVITAMIN) tablet Take 1 tablet by mouth daily.        . phosphorus (K PHOS NEUTRAL) 155-852-130 MG tablet Take 1 tablet by mouth 2 (two) times daily.  60 tablet  11  . potassium chloride (K-DUR) 10 MEQ tablet in the morning and at night.      . torsemide (DEMADEX) 20 MG tablet Take 1 tablet (20 mg total) by mouth daily.  30 tablet  11   No facility-administered encounter medications on file as of 03/23/2013.    No Known Allergies   Current Medications, Allergies, Past Medical History, Past Surgical History, Family History, and Social History were reviewed in [employer] record.    Review  of Systems        See HPI - all other systems neg except as noted...      The patient complains of decreased hearing, dyspnea on exertion, peripheral edema, incontinence, muscle weakness, and difficulty walking.  The patient denies anorexia, fever, weight loss, weight gain, vision loss, hoarseness, chest pain, syncope, prolonged cough, headaches, hemoptysis, abdominal pain, melena, hematochezia, severe indigestion/heartburn, hematuria, suspicious skin lesions, transient blindness, depression, unusual weight change, abnormal bleeding, enlarged lymph nodes, and angioedema.  Objective:   Physical Exam      WD, WN, 79 y/o BF in NAD... GENERAL:  Alert, pleasant & cooperative; she is slow moving as noted... HEENT:  [place]/AT, EOM-full, EACs- some wax, TMs-wnl, NOSE-clear, THROAT-clear & wnl. NECK:  Supple w/ fairROM; no JVD; normal carotid impulses w/o bruits; no thyromegaly or nodules palpated; no lymphadenopathy. CHEST:  Clear to P & A; without wheezes/ rales/ or rhonchi., noted kyphosis... HEART:  Regular Rhythm; without murmurs/ rubs/ or gallops heard... ABDOMEN:  Soft & nontender; normal bowel sounds; no organomegaly or masses detected. EXT:  mod arthritic changes; no varicose veins/ +venous insuffic/ 1-2+edema... NEURO:   Alert, answers questions appropriately, but slowly, no focal neuro deficits... DERM:  No lesions noted; no rash etc...  MMSE> 06/13/10> Score 26/30 (missed place, 2/3 recall after distraction, & copy figure)...   Assessment & Plan:    AR & ASTHMA>  Breathing is stable, at baseline, she has chr rhinorrhea complaints w/ numerous discussions regarding management of this problem....  HBP>  BP is trending lower now & she is c/o incr edema; rec no salt etc & we decided to STOP the Amlodipine...  Electrolye Abn>  See 10/13 Hosp by [facility], it is apparent that her elev calcium is the result of HYPER-PARATHYROIDISM but at age 80 we are reluctant to refer for surg unless  progressive...  Ven insuffic>  Her CC is pedal edema, she does not grasp the idea of ven insuffic but says she's not eating salt; rec elevation, support hose & continue Demadex20mg Qam & Diamox250Qpm.  CHOL>  She has the world's best HDL!!!  Hypercalcemia>  Surely from Hyperpara & she has stopped the calcium supplements, & we are following the labs...  GU>  She saw DrTannenbaum w/ extensive investigation into her voiding symptoms & urodynamics as above; rec to use "double voiding" technique...  DJD/ Osteopenia>  Aware, she is stable on OTC meds, she has repeatedly refuse Bisphos meds for her bones...  Dementia>  She & husb [name] are still living independently & have been married 62 yrs.  Other medical problems as noted...   Patient's Medications  New Prescriptions   CLOTRIMAZOLE-BETAMETHASONE (LOTRISONE) CREAM    Apply topically 2 (two) times daily.   HYDROXYZINE (ATARAX/VISTARIL) 25 MG TABLET    Take 1 tablet (25 mg total) by mouth every 4 (four) hours as needed for itching.  Previous Medications   ACETAZOLAMIDE (DIAMOX) 250 MG TABLET    Take 1 tablet (250 mg total) by mouth daily. At 4 pm   ALPRAZOLAM (XANAX) 0.25 MG TABLET    Take 0.125-0.25 mg by mouth 3 (three) times daily as needed. For nerves   ASPIRIN EC 81 MG TABLET    Take 81 mg by mouth daily.   FEEDING SUPPLEMENT (ENSURE COMPLETE) LIQD    Take 237 mLs by mouth 2 (two) times daily between meals.   LISINOPRIL (PRINIVIL,ZESTRIL) 5 MG TABLET    Take 1 tablet (5 mg total) by mouth daily.   MULTIPLE VITAMIN (MULTIVITAMIN) TABLET    Take 1 tablet by mouth daily.     PHOSPHORUS (K PHOS NEUTRAL) 155-852-130 MG TABLET    Take 1 tablet by mouth 2 (two) times daily.   POTASSIUM CHLORIDE (K-DUR) 10 MEQ TABLET    in the morning and at night.   TORSEMIDE (DEMADEX) 20 MG TABLET    Take 1 tablet (20 mg total) by mouth daily.  Modified Medications   No medications on file  Discontinued Medications  AMLODIPINE (NORVASC) 5 MG  TABLET    Take 2.5 mg by mouth every morning.

## 2013-03-23 NOTE — Patient Instructions (Addendum)
Today we updated your med list in our EPIC system...    Continue your current medications the same...  Your BP is slightly low & I want you to STOP the NORVASC (Amlodipine) currently 5mg - taking 1/2 tab daily (STOP THIS PILL)...  For the swelling in your legs>      NO SALT/ Sodium...    Elevate legs as much as poss...    Wear support hose...    Continue the [facility] & DIAMOX diuretics...  For the rash/ itching>    Use the new LOTRISONE Cream (Betamethasone) twice daily...    Use the new itching pill- ATARAX (Hydroxyzine) 25mg  every 4H as needed...  We will arrange for Visiting nurses to help once again w/ your MEDS...    And HOME PT to help w/ your mobility...  Let's plan a follow up visit in 78mo, sooner if needed for problems.Marland KitchenMarland Kitchen

## 2013-03-24 ENCOUNTER — Telehealth: Payer: Self-pay | Admitting: Pulmonary Disease

## 2013-03-24 LAB — PTH, INTACT AND CALCIUM
Calcium, Total (PTH): 11.2 mg/dL — ABNORMAL HIGH (ref 8.4–10.5)
PTH: 85.5 pg/mL — ABNORMAL HIGH (ref 14.0–72.0)

## 2013-03-24 NOTE — Telephone Encounter (Signed)
I spoke with [facility]. She stated she has already spoken with libby regarding this. I spoke with libby and confirmed. She stated she is going to send the order to another company. Nothing further was needed

## 2013-03-26 ENCOUNTER — Telehealth: Payer: Self-pay | Admitting: Pulmonary Disease

## 2013-03-26 NOTE — Telephone Encounter (Signed)
I spoke with Jessica [name]. She requesting VO for home nursing visits to help with medication management. I advised her this was fine. Nothing further was needed

## 2013-03-31 ENCOUNTER — Other Ambulatory Visit: Payer: Self-pay | Admitting: Pulmonary Disease

## 2013-03-31 DIAGNOSIS — I509 Heart failure, unspecified: Secondary | ICD-10-CM

## 2013-04-01 ENCOUNTER — Telehealth: Payer: Self-pay | Admitting: Pulmonary Disease

## 2013-04-01 NOTE — Telephone Encounter (Signed)
Called and spoke with demeka with [employer] and she stated that the pts BP was 98/60 today and her pulse was 58.  i advised her that her BP's have been running about the same the last couple of times in the office and her pulse has been around the same 58-60 at her last couple of OV.  She stated that the pt is still c/o of her legs swelling and she does have some support hose that she wears but feels that these are not helping and the pt does not understanding to keep her legs elevated during the day.  i reviewed our med list with demeka and she stated that the pt also takes something for constipation as needed but the med list was correct and that the pt has been taking these daily.  SN please advise of anything further needed.  No Known Allergies

## 2013-04-01 NOTE — Telephone Encounter (Signed)
Per SN--  Cont the same meds at this time.  BP and pulse are ok.   Ok to get the new support hose but keep the old ones.  Called and lmom for dameka to make her aware. Nothing further is needed.

## 2013-04-06 ENCOUNTER — Telehealth: Payer: Self-pay | Admitting: Pulmonary Disease

## 2013-04-06 NOTE — Telephone Encounter (Signed)
LMTCB

## 2013-04-07 NOTE — Telephone Encounter (Signed)
Per SN- PT okay  Spoke with Minerva Areola and gave VO  Nothing further needed

## 2013-04-30 ENCOUNTER — Telehealth: Payer: Self-pay | Admitting: Pulmonary Disease

## 2013-04-30 NOTE — Telephone Encounter (Signed)
Per SN---  Wrap legs in the morning with the ace bandage every morning to help with the swelling. Cont the same meds  For now and cont the no salt in her diet.  Pt stated that she is still keeping her feet elevated as much as possible.  Pt voiced her understanding of this and will try the wraps to see if this helps.

## 2013-04-30 NOTE — Telephone Encounter (Signed)
Spoke with the pt She is c/o increased swelling in her feet more than usual since this am She is keeping her legs up, no salt, taking diamox 250 mg qd and demadex 20 mg qd as directed She states unable to get her support hose on b/c of the swelling Please advise thanks! No Known Allergies

## 2013-05-03 ENCOUNTER — Telehealth: Payer: Self-pay | Admitting: Pulmonary Disease

## 2013-05-03 NOTE — Telephone Encounter (Signed)
Spoke with pt  She states that she is having some swelling in her legs still She states that she never tried wrapping with ACE bandage as SN rec when she called in on 04/30/13 She states that she has instead been soaking her feet in warm water I advised to stop doing this, try the ACE wrap in the am and KEEP LEGS UP Pt verbalized understanding and states nothing further needed

## 2013-05-05 ENCOUNTER — Telehealth: Payer: Self-pay | Admitting: Pulmonary Disease

## 2013-05-06 ENCOUNTER — Telehealth: Payer: Self-pay | Admitting: Pulmonary Disease

## 2013-05-06 MED ORDER — DONEPEZIL HCL 5 MG PO TABS: 5.0000 mg | ORAL_TABLET | Freq: Every evening | ORAL | Status: DC | PRN

## 2013-05-06 NOTE — Telephone Encounter (Signed)
I spoke with Jessica [name]. She stated she saw Jessica [name] on Tuesday and Jessica [name]\ advised her she has been having problems with her memory. She wants soemthing to help with this and Jessica [name] used to take aricpet but no longer does. Please advise SN thanks Last OV 03/23/13 Pending 05/25/13 No Known Allergies

## 2013-05-06 NOTE — Telephone Encounter (Signed)
Order given to eric to extend the home health for 2-3 more week.  i have lmom to make eric aware.

## 2013-05-06 NOTE — Telephone Encounter (Signed)
Per SN--  Ok to start on the aricept 5 mg daily.  This has been sent in to the pharmacy for the pt.  i called and lmom for demeka from [facility] to make her aware. Nothing further is needed.

## 2013-05-17 ENCOUNTER — Telehealth: Payer: Self-pay | Admitting: Pulmonary Disease

## 2013-05-17 NOTE — Telephone Encounter (Signed)
Called and spoke with damika from [facility] and she stated that she will be able to recert the pt and will add in the dementia that she has noticed with the pt here recently.  She stated that her orders will not run out until 6-8 but she will call back later this week about the new order for recert. Nothing needed at this time.

## 2013-05-19 ENCOUNTER — Telehealth: Payer: Self-pay | Admitting: Pulmonary Disease

## 2013-05-19 NOTE — Telephone Encounter (Signed)
Per TD add pt on SN scheduled for tomorrow at 10 AM  I called pt and appt scheduled. Nothing further was needed

## 2013-05-19 NOTE — Telephone Encounter (Signed)
Called, spoke with pt.  Reports her feet, ankles, and legs are swollen more this morning when she woke up.  Swelling is equal bilaterally per pt and is up to her knees.  States she is using hose, keeping her keep elevated, and is using a no added salt diet.  States her legs aren't painful but does have a "pulling" sensation on the bottoms of her feet.  She is requesting recs and a call back ASAP.  Dr. Kriste Basque, pls advise.  Thank you.  No Known Allergies  Last OV with SN 03/23/13; asked to f/u in 2 months. Pending OV with SN on 05/25/13

## 2013-05-20 ENCOUNTER — Ambulatory Visit (INDEPENDENT_AMBULATORY_CARE_PROVIDER_SITE_OTHER): Payer: Medicare Other | Admitting: Pulmonary Disease

## 2013-05-20 ENCOUNTER — Encounter: Payer: Self-pay | Admitting: Pulmonary Disease

## 2013-05-20 ENCOUNTER — Other Ambulatory Visit (INDEPENDENT_AMBULATORY_CARE_PROVIDER_SITE_OTHER): Payer: Medicare Other

## 2013-05-20 VITALS — BP 110/68 | HR 58 | Temp 98.1°F | Ht 60.0 in | Wt 113.4 lb

## 2013-05-20 DIAGNOSIS — M1990 Unspecified osteoarthritis, unspecified site: Secondary | ICD-10-CM

## 2013-05-20 DIAGNOSIS — K219 Gastro-esophageal reflux disease without esophagitis: Secondary | ICD-10-CM

## 2013-05-20 DIAGNOSIS — R609 Edema, unspecified: Secondary | ICD-10-CM

## 2013-05-20 DIAGNOSIS — G589 Mononeuropathy, unspecified: Secondary | ICD-10-CM

## 2013-05-20 DIAGNOSIS — M810 Age-related osteoporosis without current pathological fracture: Secondary | ICD-10-CM

## 2013-05-20 DIAGNOSIS — I10 Essential (primary) hypertension: Secondary | ICD-10-CM

## 2013-05-20 DIAGNOSIS — I4510 Unspecified right bundle-branch block: Secondary | ICD-10-CM

## 2013-05-20 DIAGNOSIS — I872 Venous insufficiency (chronic) (peripheral): Secondary | ICD-10-CM

## 2013-05-20 DIAGNOSIS — F411 Generalized anxiety disorder: Secondary | ICD-10-CM

## 2013-05-20 DIAGNOSIS — F0390 Unspecified dementia without behavioral disturbance: Secondary | ICD-10-CM

## 2013-05-20 LAB — BASIC METABOLIC PANEL
BUN: 17 mg/dL (ref 6–23)
Calcium: 10.9 mg/dL — ABNORMAL HIGH (ref 8.4–10.5)
Creatinine, Ser: 1 mg/dL (ref 0.4–1.2)

## 2013-05-20 MED ORDER — TORSEMIDE 20 MG PO TABS: ORAL_TABLET | ORAL | Status: DC

## 2013-05-20 NOTE — Progress Notes (Signed)
Subjective:    Patient ID: Jessica [name], female    DOB: 1911/08/03, 79 y.o.   MRN: [id number]  HPI 79 y/o BF here for a follow up visit...  She has mult medical problems including:  AR & runny nose;  Asthmatic Bronchitis;  HBP;  Chronic VI & edema;  Hyperchol;  GERD & esoph stricture;  Divertics;  Hx Urinary incontinence & UTIs;  DJD/ Osteoporosis;  Semile Dementia;  Anxiety...  SEE PREV EPIC NOTES FOR EARLIER DATA >>  ~  March 25, 2012:  19mo ROV & Jessica [name] is basically stable at 79 y/o; she notes some intermittent swelling in ankles & occas sharp CP laterally (min tender CWP)...  Jessica [name] is currently in rehab at [facility] & I doubt she will be able to cqare for him when the rehab stay is up...  Her CC is "just weak", eating OK, wt= 121#, walks w/ cane, no falls, denies SOB & manages ADLs ok...  We reviewed prob list, meds, xrays & labs>  See below>> LABS 3/13:  Chems- ok x Ca=11.1;  CBC- ok w/ Hg=13.2;  TSH=1.63;  BNP=101 We will need to check PTH in follow up...  ~  May 08, 2012:  6wk ROV & Jessica [name] returns w/ increased swelling in ankles and feet- she has known VI & periph edema that has been refractory to management w/ her attempts at low sodium diet, elevation & support hose; she also takes Lasix 20mg  starting at one Qam & increased to Bid; she notes that swelling tends to go down overnight & is worse late in the day; she has lost 3# to 118# since last OV yet she is very concerned & quite perturbed about the edema...  We discussed the need to check CXR & labs; we will change the Lasix to Demadex 20mg - 2 Qam & follow up 1 month... CXR 5/13 showed borderline heart size, kyphoscoliosis, no edema, and clear lungs... LABS 5/13:  Pending ==> she forgot to go to the lab this day.  ~  June 23, 2012:  6wk ROV & Amberly's edema is diminished on the Demadex20mg - 2 daily but she prefers to split it up 1AM & 1afternoon due to urination; she has mult additional symptoms, somatic complaints, etc> eg- c/o trouble w/ her  ears w/ a noise "not a ringing, it' a frying" & noted less when active, distracted w/ background noise etc (we discussed tinnitus & masking the sound w/ "white noise");  She also wants additional Physical therapy & balance retraining from [facility]...   daugh notes Jessica [name] is still in [place] but they may send him home soon...    We reviewed prob list, meds, xrays and labs> see below>> LABS 7/13:  Chems- HCO3=39, BUN=26, Creat=1.2, Ca=11.8, PTH=81... Rec- add DIAMOX 250mg /d, no calc supplements etc...  ~  July 23, 2012:  28mo ROV & Jessica [name] is doing the best she can at home w/ daughter's help but they have a hard time w/ her meds & pt's dementia... After last visit she had more "therapy" but this too has stopped since she really "couldn't do it" per daughter... Edema has decr but persists in ankles on Demadex20-2/d (?if taking) & Diamox250/d added; note that weight is the same at 116-117# today...     We reviewed prob list, meds, xrays and labs> see below for update >> We decided to simplify her regimen> Norvasc1/2, Lisinopril1/2, Demadex- one daily, Diamox- one daily...  ~  August 25, 2012:  28mo ROV & Jessica [name] states "  Sometimes I feel like I'm doing better" and "today is not one of my worst days"; weight is down 2-3# to 115# today on her Demadex20 & Diamox250; she notes incr stress because Jessica [name] is back home & there is a lot on her- son comes in early AM hours, then a lady helps during the day, daugh in the afternoon, meals on wheels, etc...     VI, Edema> feet are still swelling, on Demadex20, Diamox250, no salt/ elevation/ support hose/ etc; labs showed stable renal w/ Cr=1.1 but K=3.4 & we will start K10Bid...    Hypercalcemia> repeat Ca=11.0 (sl improved) and PTH July2013 was 81; we are folowing & plan repeat PTH in 4-67mo; she knows to avoid calcium supplements & stop VitD as well.    Dementia> she is very slow & deliberate; I continue to feel that she & husb need NHP vs AL etc but they  refuse to consider alternative living arrangements... We reviewed prob list, meds, xrays and labs> see below for updates >> LABS 9/13:  Chems- ok w/ K=3.4 (adding K10Bid), BS=90, BUN=17 Cr=1.1, Ca=11.0;  CBC- ok w/ Hg=12.5  ~  October 27, 2012:  265mo ROV & post hosp check> Jessica [name] was [facility] 10/26 - 10/15/12 by [facility] w/ weakness, poor appetite & intake, electrolyte abn (K=3.3, Ca=10.1, Phos=1.8) & prot/cal malnutrition;  She responded to IV therapy & was covered w/ Rocephin=>Cipro, Urine grew mult species;  She went to [facility] for rehab & is still there- it is clear that pt & husb cannot care for self & family unable to provide adequately, but finances preclude NHP for pt & husb... She notes feeling better, strength better, PT helping...    HBP> on ASA81, Amlod2.5, Lisin5, Demadex20-1/2, Diamox250, & K20/d; BP=122/64 & she is feeling better, still w/ mult somatic complaints...    VI> she knows to elim sodium, elev legs, wear support hose, take the diuretics- we decided to incr Demadex back to 20mg /d...    Chol> on diet alone; she has a very hi HDL ~150!!!    Hypercalcemia> assoc w/ low phos- r/o hyperparathyroidism- see below...    GI- GERD, Divertics> on Prilosec20, Miralax, Senakot-S - all as needed...    GU- saw DrTannenbaum for Urology 10/12- mult complaints, urodynamics done w/ sm hypersens bladder unstable w/ leakage, & large bladder divertic> he rec double voiding technique...    DJD, Osteopenia> off Ca supplements they sent her home from hosp w/ Phos-2Bid...    Senile Dementia> on Aricept10 & Alpraz0.25 as needed... We reviewed prob list, meds, xrays and labs> see below for updates >>  LABS 11/13:  Chems- ok w/ K=3.5 Ca=10.5 Phos=2.6 (2.3-4.6) Mg=2.1.Marland Kitchen.      ~  December 03, 2012:  65mo ROV & Jessica [name]'s CC= swelling in feet; her wt= 114# up 1# on Demadex20, Diamox250, K10-3/d, and KPhos-Bid; reminded to elim sodium, elev legs, wear support hose...  She is under a lot of stress w/ [place] home, son  helps but works...      We reviewed prob list, meds, xrays and labs> see below for updates >>  LABS 12/13:  Chems- ok w/ K=3.5 Creat=1.1 Ca=10.8 Phos=3.6 PTH=122 (c/w hyperparathyroidism)...  We decided to continue her same meds for now...  ~  January 08, 2013:  65mo ROV & Kellen continues to insist on monthly ROVs- c/o pedal edema about the same as prev, wt stable 114# despite running out of diuretics & we discussed refilling her Demadex20 & Diamox250;  BP stable, O2 sats  are 99% on RA, chest clear, etc...  She persists w/ bladder complaints and she is reminded about the double voiding technique per DrTannenbaum & rec for Urology f/u prn...  ~  March 23, 2013:  2-26mo ROV & Yemaya is c/o ankle swelling which tends to go down overnight- we discussed stopping her Amlodipine & redoubling efforts at no salt, elevation, support hose, & Demadex;  also c/o fine rash ?generalized w/ pruritis & we discussed Rx w/ Lotrisone cream & Atarax 25mg  prn... She is once again asking for yet another round of home PT therapy...  We reviewed the following medical problems during today's office visit >>     HBP> on ASA81, Amlod2.5, Lisin5, Demadex20-1/2, Diamox250, & K10-3/d + KPhos-1Bid; BP=100/62 & she is feeling sl weak w/ mult somatic complaints; labs 4/14 show K=3.7, Phos=3.4, Cr=1.1; we decided to STOP the Amlodipine & work on sodium restriction.    VI, edema> she knows to elim sodium, elev legs, wear support hose, take Demadex 20mg /d...    Chol> on diet alone; she has a very hi HDL ~150!!!    Hypercalcemia> assoc w/ low phos- c/w primary hyperparathyroidism- she is 79y/o & not felt to be an operative candidate; she was started on KPhos 10/13 in [facility] & disch on 2Bid w/ improvement in serum Phos to 3.6; serum Ca = 11.2 now but PTH is not rising.    GI- GERD, Divertics> off prev Prilosec20, Miralax, Senakot-S; advised these all as needed...    GU> saw DrTannenbaum for Urology 10/12- mult complaints, urodynamics done w/ sm  hypersens bladder unstable w/ leakage, & large bladder divertic> he rec double voiding technique...    DJD, Osteopenia> off Ca supplements they sent her home from hosp w/ Phos-2Bid...    Senile Dementia> on Aricept10 & Alpraz0.25 as needed... We reviewed prob list, meds, xrays and labs> see below for updates >>  LABS 4/14:  Chems- Ca=11.2 but PTH is sl lower than prev at 86;  Phos=3.4 on KPhos1tabBid; K= 3.7 on this & K10- 2AM+1PM;  LFTs= wnl and Alb= 4.0 on Ensure...  ~  May 20, 2013:  37mo ROV & add-on for swelling> Zuzu persists w/ c/o swelling in her legs; ?if she recalls our prev conversation about salt, elevation, support hose; she is taking the Demadex20mg Qam & Diamox250mg  Qpm along w/ her KCl & KPhos=> labs today show: K=3.6, BUN=17, Cr=1.0;  Exam shows 3+edema in feet & lower legs, chest clear, heart w/ Gr1/6 SEM w/o rubs or gallops apprec;  We reviewed the need to Elim salt, Elevate the legs, Wear support hose, & we decided to STOP Lisinopril (BP=110/68) and INCREASE the Demadex20 to 2tabsQam...  ROV in 4-6weeks recheck; hopefully she can keep her visiting nurses, home help, etc (she will not go willingly to a NH or AL facility)...     We reviewed prob list, meds, xrays and labs> see below for updates >>          Problem List:  Hx of ALLERGIES (ICD-995.3) - she uses OTC meds as needed, & we prev discussed Zyrtek 10mg Qam, Saline nasal mist, & Atrovent Nasal 0.03% Tid for drainage (one of her chronic complaints)...  Hx of ASTHMATIC BRONCHITIS, ACUTE (ICD-466.0) - no recent exacerbations... ~  CXR 5/13 showed borderline heart size, kyphoscoliosis, no edema, and clear lungs... ~  CXR 10/13 showed normal heart size, clear lungs, kyphoscoliosis, osteopenia...  HYPERTENSION (ICD-401.9) >>  ~  controlled on ASA 81mg /d, NORVASC5mg -1/2tab, LISINOPRIL5 & DEMADEX 20mg /d & DIAMOX250mg /d... ~  4/12:  BP= 110/60 and she states that she is taking her meds regularly & tolerating them well... denies HA,  visual changes, CP, palpit, change in dyspnea, etc... but notes weakness, intermittently dizzy & persist edema in her feet...  ~  6/12:  BP= 124/70 & she remains stable... ~  8/12:  BP= 110/ 66 & stable... ~  10/12:  BP= 114/62 & stable... ~  1/13:  BP= 110/64 & she remains stable... ~  4/13:  BP= 116/58 & she is reminded to adjust Lasix 1-2 Qam according to her edema... ~  5/13:  BP= 140/72 & she is c/o pedal edema; we decided to change to Demadex 20mg - 2Qam. ~  7/13:  BP= 110/60 & her edema is diminished, still 1+ in feet... Labs showed HCO3=39 & DIAMOX 250mg /d added. ~  8/13:  BP= 118/74 & edema is the same; ?what she's taking? & we decided to simplify regimen> Norvasc1/2, Lisin1/2, Demadex-1/d & Diamox-1/d... ~  9/13:  BP= 110/70 & she is clinically stable on these meds; add K10Bid for the K=3.4 today... ~  EKG 10/13 showed NSR, rate63, RBBB, poss old infarct anterolaterally... ~  11/13:  on ASA81, Amlod2.5, Lisin5, Demadex20-1/2, Diamox250, & K20/d; BP=122/64 & she is feeling better, still w/ mult somatic complaints... ~  12/13:  BP= 124/72 & her edema, weight, etc are about stable... ~  1/14:  BP= 118/60 & she persists w/ mult somatic complaints... ~  4/14:  on ASA81, Amlod2.5, Lisin5, Demadex20-1/2, Diamox250, & K10-3/d + KPhos-1Bid; BP=100/62 & she is feeling sl weak w/ mult somatic complaints; labs 4/14 show K=3.7, Phos=3.4, Cr=1.1; we decided to STOP the Amlodipine & work on sodium restriction ~  6/14: on ASA81, Lisin5, Demadex20, Diamox250, & K10-3/d + KPhos-1Bid; BP= 110/68 & 3+edema persists; Labs- OK & we decided to STOP the Lisinopril & INCR the Demadex20-2Qam....  VENOUS INSUFFICIENCY, CHRONIC (ICD-459.81) - there is mild pedal edema bilat L>R... on low sodium diet, elevation, support hose- prev refused diuretic meds due to urinary symptoms, now on DEMADEX 20mg   HYPERCHOLESTEROLEMIA (ICD-272.0) - prev on Lip10, but she stopped this on her own... she has a very high HDL!!! ~  FLP  was 1/08 showed TChol 253, TG 125, HDL 121, LDL 93 ~  FLP 2/09 showed TChol 200, TG 99, HDL 136, LDL 44 ~  FLP 4/11 showed TChol 244, TG 143, HDL 149, LDL 59 ~  FLP 1/12 showed TChol 266, TG 102, HDL 151, LDL 83 >> BEST HDL I'VE EVER SEEN  HYPERCALCEMIA >> routine labs w/ Calcium levels betw 10.2 & 11.7 over the last 11yrs... ~  Labs 7/13 showed Ca= 11.8 &  PTH= 81... For now avoid calc supplements etc... ~  Labs 9/13 showed Ca= 11.0 & she is reminded- no calcium, no VitD... ~  Labs 10/13 Hosp reviewed> Ca was up, Phos was low, K was low> all improved w/ Rx in hosp (they did not repeat PTH level). ~  4/14:   assoc w/ low phos- c/w primary hyperparathyroidism- she is 79y/o & not felt to be an operative candidate; she was started on KPhos 10/13 in [facility] & disch on 2Bid w/ improvement in serum Phos to 3.6; serum Ca = 11.2 now but PTH is not rising.   GERD (ICD-530.81) - last EGD was 9/05 by DrPerry showing GERD and esoph stricture- dilated... she stopped her Nexium but states that her swallowing is good & she uses PRILOSEC OTC as needed.  DIVERTICULOSIS OF COLON (ICD-562.10) - last colonoscopy  was 11/00 showing divertics only... ~  8/12:  She notes some constipation & rec to take [facility] & SENAKOT-S per our usual protocol...  HX, URINARY INFECTION (ICD-V13.02) & INCONTINENCE symptoms... she has seen DrTannenbaum & staff on bladder exercises and she reported some improvement but has persistant symptoms & never followed up... ~  7/10:  offered f/u appt w/ Urology vs second opinion consult but she declines... ~  9/10:  wrote for trial Enablex 7.5mg /d >> no benefit she says. ~  12/10: saw DrNesi- tried sample med, sl improved, but "he turned me over to a lady for longer term treatments" she says... ~  4/12 & 6/12: states DrNesi hasn't been able to help her & she wants appt w/ DrTannenbaum==> seen w/ urodynamic eval revealing a sm hypersens bladder & a large bladder divertic; not a surg candidate, taught  double voiding technique... ~  10/12: she had f/u DrTannenbaum & he reinforced prev w/u & need for double voiding technique... ~  10/13: In hosp Urine grew mult species, Rx w/ Roceph=> Cipro...  DEGENERATIVE JOINT DISEASE (ICD-715.90) - notes right shoulder symptoms, but managing OK w/ Mobic 7.5mg  & Tylenol; also right hip & low back pain...  she also c/o neuropathic "burning" in legs but not bad enough for additional meds she says...  OSTEOPOROSIS (ICD-733.00) - she was on Fosamax but had discontinued this on her own... we discussed the need for continued therapy and she was asked to restart Alendronate but she never did...  she takes Ca++, MVI, VitD==> rec to STOP the Calcium.  SENILE DEMENTIA (ICD-290.0) - she takes ASA 81mg /d... she tried Aricept in the past but didn't notice any improvement therefore stopped it. ~  she & her husb still live on their own but are having numerous difficulties while still resisting any thoughts of assisted living etc> got concerned because she saw an impulse in her left antecubital fossa (tortuous brachial art), went to the ER & this got translated to palpitations & lead to full ER cardiac eval= neg... ~  6/11: MMSE showed 26/30 & she agreed to try DONEPEZIL 10mg /d... ~  6/12:  Still taking Aricept & appears stable... ~  7/13:  No change, same meds, clinically stable...  ANXIETY (ICD-300.00) - she uses Alpraz 0.25mg  as needed.  Hx of SHINGLES (ICD-053.9)   Past Surgical History  Procedure Laterality Date  . Vesicovaginal fistula closure w/ tah    . Tonsillectomy    . Abdominal hysterectomy      Outpatient Encounter Prescriptions as of 05/20/2013  Medication Sig Dispense Refill  . acetaZOLAMIDE (DIAMOX) 250 MG tablet Take 1 tablet (250 mg total) by mouth daily. At 4 pm  30 tablet  11  . ALPRAZolam (XANAX) 0.25 MG tablet Take 0.125-0.25 mg by mouth 3 (three) times daily as needed. For nerves      . aspirin EC 81 MG tablet Take 81 mg by mouth daily.      .  clotrimazole-betamethasone (LOTRISONE) cream Apply topically 2 (two) times daily.  30 g  11  . donepezil (ARICEPT) 5 MG tablet Take 1 tablet (5 mg total) by mouth at bedtime as needed.  30 tablet  6  . feeding supplement (ENSURE COMPLETE) LIQD Take 237 mLs by mouth 2 (two) times daily between meals.  60 Bottle  0  . hydrOXYzine (ATARAX/VISTARIL) 25 MG tablet Take 1 tablet (25 mg total) by mouth every 4 (four) hours as needed for itching.  50 tablet  5  . lisinopril (PRINIVIL,ZESTRIL)  5 MG tablet Take 1 tablet (5 mg total) by mouth daily.      . Multiple Vitamin (MULTIVITAMIN) tablet Take 1 tablet by mouth daily.        . phosphorus (K PHOS NEUTRAL) 155-852-130 MG tablet Take 1 tablet by mouth 2 (two) times daily.  60 tablet  11  . potassium chloride (K-DUR) 10 MEQ tablet in the morning and at night.      . torsemide (DEMADEX) 20 MG tablet Take 1 tablet (20 mg total) by mouth daily.  30 tablet  11   No facility-administered encounter medications on file as of 05/20/2013.    No Known Allergies   Current Medications, Allergies, Past Medical History, Past Surgical History, Family History, and Social History were reviewed in [employer] record.    Review of Systems        See HPI - all other systems neg except as noted...      The patient complains of decreased hearing, dyspnea on exertion, peripheral edema, incontinence, muscle weakness, and difficulty walking.  The patient denies anorexia, fever, weight loss, weight gain, vision loss, hoarseness, chest pain, syncope, prolonged cough, headaches, hemoptysis, abdominal pain, melena, hematochezia, severe indigestion/heartburn, hematuria, suspicious skin lesions, transient blindness, depression, unusual weight change, abnormal bleeding, enlarged lymph nodes, and angioedema.     Objective:   Physical Exam      WD, WN, 79 y/o BF in NAD... GENERAL:  Alert, pleasant & cooperative; she is slow moving as  noted... HEENT:  [place]/AT, EOM-full, EACs- some wax, TMs-wnl, NOSE-clear, THROAT-clear & wnl. NECK:  Supple w/ fairROM; no JVD; normal carotid impulses w/o bruits; no thyromegaly or nodules palpated; no lymphadenopathy. CHEST:  Clear to P & A; without wheezes/ rales/ or rhonchi., noted kyphosis... HEART:  Regular Rhythm; Gr1/6 SEM, without rubs or gallops heard... ABDOMEN:  Soft & nontender; normal bowel sounds; no organomegaly or masses detected. EXT:  mod arthritic changes; no varicose veins/ +venous insuffic/ 3+edema now deficits... DERM:  No lesions noted; no rash etc...  MMSE> 06/13/10> Score 26/30 (missed place, 2/3 recall after distraction, & copy figure)...   Assessment & Plan:    Ven Insuffic/ Edema>>  We have had mult conversations at each OV regarding no salt, elevation, support hose; she is on Demadex, Diamox & K supplementation;  6/14 Plan is to incr the Demadex to 2Qam & stop the Lisinopril for now...   AR & ASTHMA>  Breathing is stable, at baseline, she has chr rhinorrhea complaints w/ numerous discussions regarding management of this problem....  HBP>  BP is trending lower now & she is c/o incr edema; rec no salt etc & we prev decided to STOP the Amlodipine, now we will stop the Lisinopril as well......  Electrolye Abn>  See 10/13 Hosp by [facility], it is apparent that her elev calcium is the result of HYPER-PARATHYROIDISM but at age 19 we are reluctant to refer for surg unless progressive...  CHOL>  She has the world's best HDL!!!  Hypercalcemia>  Surely from Hyperpara & she has stopped the calcium supplements, & we are following the labs...  GU>  She saw DrTannenbaum w/ extensive investigation into her voiding symptoms & urodynamics as above; rec to use "double voiding" technique...  DJD/ Osteopenia>  Aware, she is stable on OTC meds, she has repeatedly refuse Bisphos meds for her bones...  Dementia>  She & husb Jessica [name] are still living independently & have been married 62  yrs.  Other medical problems  as noted...   Patient's Medications  New Prescriptions   No medications on file  Previous Medications   ACETAZOLAMIDE (DIAMOX) 250 MG TABLET    Take 1 tablet (250 mg total) by mouth daily. At 4 pm   ALPRAZOLAM (XANAX) 0.25 MG TABLET    Take 0.125-0.25 mg by mouth 3 (three) times daily as needed. For nerves   ASPIRIN EC 81 MG TABLET    Take 81 mg by mouth daily.   CLOTRIMAZOLE-BETAMETHASONE (LOTRISONE) CREAM    Apply topically 2 (two) times daily.   DONEPEZIL (ARICEPT) 5 MG TABLET    Take 1 tablet (5 mg total) by mouth at bedtime as needed.   FEEDING SUPPLEMENT (ENSURE COMPLETE) LIQD    Take 237 mLs by mouth 2 (two) times daily between meals.   HYDROXYZINE (ATARAX/VISTARIL) 25 MG TABLET    Take 1 tablet (25 mg total) by mouth every 4 (four) hours as needed for itching.   MULTIPLE VITAMIN (MULTIVITAMIN) TABLET    Take 1 tablet by mouth daily.     PHOSPHORUS (K PHOS NEUTRAL) 155-852-130 MG TABLET    Take 1 tablet by mouth 2 (two) times daily.   POTASSIUM CHLORIDE (K-DUR) 10 MEQ TABLET    in the morning and at night.  Modified Medications   Modified Medication Previous Medication   TORSEMIDE (DEMADEX) 20 MG TABLET torsemide (DEMADEX) 20 MG tablet      Take 2 tablets by mouth every morning    Take 1 tablet (20 mg total) by mouth daily.  Discontinued Medications   LISINOPRIL (PRINIVIL,ZESTRIL) 5 MG TABLET    Take 1 tablet (5 mg total) by mouth daily.

## 2013-05-20 NOTE — Patient Instructions (Addendum)
Today we updated your med list in our EPIC system...    We decided to STOP THE LISINOPRIL medication....  For your swelling>>    Remember- NO SALT or SODIUM...    Elevate your legs...    Wear the support hose...    INCREASE THE DEMADEX 20mg - to 2 tabs each AM...    Continue the Diamox (Acetazolamide) 250mg  each afternoon...  Today we did your follow up metabolic panel...    We will contact you w/ the results when available...   Let's plan a follow up visit in 4 weeks.Marland KitchenMarland Kitchen

## 2013-05-24 ENCOUNTER — Telehealth: Payer: Self-pay | Admitting: Pulmonary Disease

## 2013-05-24 NOTE — Telephone Encounter (Signed)
i spoke Jessica [name] and she stated they are going to extend pt home visit x 6 weeks and if needed they can do longer. She needed nothing further

## 2013-05-25 ENCOUNTER — Ambulatory Visit: Payer: Medicare Other | Admitting: Pulmonary Disease

## 2013-06-02 ENCOUNTER — Telehealth: Payer: Self-pay | Admitting: Pulmonary Disease

## 2013-06-02 ENCOUNTER — Other Ambulatory Visit: Payer: Self-pay | Admitting: Pulmonary Disease

## 2013-06-02 MED ORDER — DONEPEZIL HCL 5 MG PO TABS: 5.0000 mg | ORAL_TABLET | Freq: Two times a day (BID) | ORAL | Status: DC

## 2013-06-02 NOTE — Telephone Encounter (Signed)
Jessica [name] has been advised of recs and rx has been sent. Carron Curie, CMA

## 2013-06-02 NOTE — Telephone Encounter (Signed)
I spoke with pt nurse. She stated she believes pt is taking an extra 5mg  aricept daily. She goes in and fills pts pill box daily. But for some reason pt takes her Aricept bottle back to the bedroom with her and stated she does this so she does not forget to take this. When she looked into pt medication bottle she only has 3 tablets left. Please advise SN thanks Last OV 05/20/13 Pending 06/23/13

## 2013-06-02 NOTE — Telephone Encounter (Signed)
Per SN---   Ok to increase the aricept  10 mg daily   ---use the 5 mg tablets  #60   Let her take the extra pill on her own  Call in this rx  aricept 5 mg #60  1 po bid.  With 6 refills. thanks

## 2013-06-09 ENCOUNTER — Telehealth: Payer: Self-pay | Admitting: Pulmonary Disease

## 2013-06-09 DIAGNOSIS — R269 Unspecified abnormalities of gait and mobility: Secondary | ICD-10-CM

## 2013-06-09 DIAGNOSIS — G589 Mononeuropathy, unspecified: Secondary | ICD-10-CM

## 2013-06-09 DIAGNOSIS — R609 Edema, unspecified: Secondary | ICD-10-CM

## 2013-06-09 DIAGNOSIS — R531 Weakness: Secondary | ICD-10-CM

## 2013-06-09 DIAGNOSIS — I872 Venous insufficiency (chronic) (peripheral): Secondary | ICD-10-CM

## 2013-06-09 DIAGNOSIS — F0390 Unspecified dementia without behavioral disturbance: Secondary | ICD-10-CM

## 2013-06-09 NOTE — Telephone Encounter (Signed)
LMTCB for [facility]

## 2013-06-09 NOTE — Telephone Encounter (Signed)
Spoke with Jessica [name].  Informed her of below per SN.  She is aware order has been placed for [facility].  She verbalized understanding and voiced no further questions or concerns at this time.

## 2013-06-09 NOTE — Telephone Encounter (Signed)
Spoke with Damika with [name]  She is calling to check the status on "cone nurse" to check on pt and help with meds With her insurance, [name] can not assist with helping with meds  She states was under the impression SN already ordered "cone nurse" to come and help the pt Please advise thanks

## 2013-06-09 NOTE — Telephone Encounter (Signed)
Order placed for [facility] "cone nurse." lmomtcb for Damika to inform her of this.

## 2013-06-09 NOTE — Telephone Encounter (Signed)
Damika is returning triage's call.  Antionette Fairy

## 2013-06-09 NOTE — Telephone Encounter (Signed)
Per SN---  Not sure that she will qualify for the cone nurse to come out and help with meds.  We can try it and see if [facility] is not able to cont to help care for the pt.  thanks

## 2013-06-17 ENCOUNTER — Telehealth: Payer: Self-pay | Admitting: Pulmonary Disease

## 2013-06-17 NOTE — Telephone Encounter (Signed)
I spoke with pt. She stated the pharmacy is telling her it is too soon to fill and it will cost her $195.99. She takes 2 per night. We called in new RX last month.  i called pt pharmacy [facility] to check on this. i was advised for some reason pt insurance is not accepting new dosage change and they will get this fixed for pt. Once they get this fixed they will give pt a call.  i called pt back and made her aware. She voiced her understanding and needed nothing further

## 2013-06-23 ENCOUNTER — Ambulatory Visit (INDEPENDENT_AMBULATORY_CARE_PROVIDER_SITE_OTHER): Payer: Medicare Other | Admitting: Pulmonary Disease

## 2013-06-23 ENCOUNTER — Other Ambulatory Visit (INDEPENDENT_AMBULATORY_CARE_PROVIDER_SITE_OTHER): Payer: Medicare Other

## 2013-06-23 ENCOUNTER — Encounter: Payer: Self-pay | Admitting: Pulmonary Disease

## 2013-06-23 VITALS — BP 120/62 | HR 53 | Temp 97.7°F | Ht 60.0 in | Wt 110.0 lb

## 2013-06-23 DIAGNOSIS — M810 Age-related osteoporosis without current pathological fracture: Secondary | ICD-10-CM

## 2013-06-23 DIAGNOSIS — R269 Unspecified abnormalities of gait and mobility: Secondary | ICD-10-CM

## 2013-06-23 DIAGNOSIS — K219 Gastro-esophageal reflux disease without esophagitis: Secondary | ICD-10-CM

## 2013-06-23 DIAGNOSIS — I4510 Unspecified right bundle-branch block: Secondary | ICD-10-CM

## 2013-06-23 DIAGNOSIS — R609 Edema, unspecified: Secondary | ICD-10-CM

## 2013-06-23 DIAGNOSIS — G589 Mononeuropathy, unspecified: Secondary | ICD-10-CM

## 2013-06-23 DIAGNOSIS — F0390 Unspecified dementia without behavioral disturbance: Secondary | ICD-10-CM

## 2013-06-23 DIAGNOSIS — I10 Essential (primary) hypertension: Secondary | ICD-10-CM

## 2013-06-23 DIAGNOSIS — M1990 Unspecified osteoarthritis, unspecified site: Secondary | ICD-10-CM

## 2013-06-23 DIAGNOSIS — F411 Generalized anxiety disorder: Secondary | ICD-10-CM

## 2013-06-23 DIAGNOSIS — I872 Venous insufficiency (chronic) (peripheral): Secondary | ICD-10-CM

## 2013-06-23 LAB — BASIC METABOLIC PANEL
GFR: 58.17 mL/min — ABNORMAL LOW (ref >60.00)
Potassium: 3.6 mEq/L (ref 3.5–5.1)
Sodium: 143 mEq/L (ref 135–145)

## 2013-06-23 MED ORDER — GABAPENTIN 100 MG PO CAPS: ORAL_CAPSULE | ORAL | Status: DC

## 2013-06-23 NOTE — Progress Notes (Signed)
Subjective:    Patient ID: Jessica [name], female    DOB: 12-10-1911, 79 y.o.   MRN: [id number]  HPI 79 y/o BF here for a follow up visit...  She has mult medical problems including:  AR & runny nose;  Asthmatic Bronchitis;  HBP;  Chronic VI & edema;  Hyperchol;  GERD & esoph stricture;  Divertics;  Hx Urinary incontinence & UTIs;  DJD/ Osteoporosis;  Semile Dementia;  Anxiety...  SEE PREV EPIC NOTES FOR EARLIER DATA >>  ~  March 25, 2012:  68mo ROV & Jessica [name] is basically stable at 79 y/o; she notes some intermittent swelling in ankles & occas sharp CP laterally (min tender CWP)...  Jessica [name] is currently in rehab at [facility] & I doubt she will be able to cqare for him when the rehab stay is up...  Her CC is "just weak", eating OK, wt= 121#, walks w/ cane, no falls, denies SOB & manages ADLs ok...  We reviewed prob list, meds, xrays & labs>  See below>> LABS 3/13:  Chems- ok x Ca=11.1;  CBC- ok w/ Hg=13.2;  TSH=1.63;  BNP=101 We will need to check PTH in follow up...  ~  May 08, 2012:  6wk ROV & Jessica [name] returns w/ increased swelling in ankles and feet- she has known VI & periph edema that has been refractory to management w/ her attempts at low sodium diet, elevation & support hose; she also takes Lasix 20mg  starting at one Qam & increased to Bid; she notes that swelling tends to go down overnight & is worse late in the day; she has lost 3# to 118# since last OV yet she is very concerned & quite perturbed about the edema...  We discussed the need to check CXR & labs; we will change the Lasix to Demadex 20mg - 2 Qam & follow up 1 month... CXR 5/13 showed borderline heart size, kyphoscoliosis, no edema, and clear lungs... LABS 5/13:  Pending ==> she forgot to go to the lab this day.  ~  June 23, 2012:  6wk ROV & Jessica [name]'s edema is diminished on the Demadex20mg - 2 daily but she prefers to split it up 1AM & 1afternoon due to urination; she has mult additional symptoms, somatic complaints, etc> eg- c/o trouble w/ her  ears w/ a noise "not a ringing, it' a frying" & noted less when active, distracted w/ background noise etc (we discussed tinnitus & masking the sound w/ "white noise");  She also wants additional Physical therapy & balance retraining from [facility] of [facility]...   daugh notes Jessica [name] is still in [place] but they may send him home soon...    We reviewed prob list, meds, xrays and labs> see below>> LABS 7/13:  Chems- HCO3=39, BUN=26, Creat=1.2, Ca=11.8, PTH=81... Rec- add DIAMOX 250mg /d, no calc supplements etc...  ~  July 23, 2012:  639mo ROV & Jessica [name] is doing the best she can at home w/ daughter's help but they have a hard time w/ her meds & pt's dementia... After last visit she had more "therapy" but this too has stopped since she really "couldn't do it" per daughter... Edema has decr but persists in ankles on Demadex20-2/d (?if taking) & Diamox250/d added; note that weight is the same at 116-117# today...     We reviewed prob list, meds, xrays and labs> see below for update >> We decided to simplify her regimen> Norvasc1/2, Lisinopril1/2, Demadex- one daily, Diamox- one daily...  ~  August 25, 2012:  639mo ROV & Jessica [name] states "  Sometimes I feel like I'm doing better" and "today is not one of my worst days"; weight is down 2-3# to 115# today on her Demadex20 & Diamox250; she notes incr stress because Jessica [name] is back home & there is a lot on her- son comes in early AM hours, then a lady helps during the day, daugh in the afternoon, meals on wheels, etc...     VI, Edema> feet are still swelling, on Demadex20, Diamox250, no salt/ elevation/ support hose/ etc; labs showed stable renal w/ Cr=1.1 but K=3.4 & we will start K10Bid...    Hypercalcemia> repeat Ca=11.0 (sl improved) and PTH July2013 was 81; we are folowing & plan repeat PTH in 4-649mo; she knows to avoid calcium supplements & stop VitD as well.    Dementia> she is very slow & deliberate; I continue to feel that she & husb need NHP vs AL etc but they  refuse to consider alternative living arrangements... We reviewed prob list, meds, xrays and labs> see below for updates >> LABS 9/13:  Chems- ok w/ K=3.4 (adding K10Bid), BS=90, BUN=17 Cr=1.1, Ca=11.0;  CBC- ok w/ Hg=12.5  ~  October 27, 2012:  49mo ROV & post hosp check> Jessica [name] was [facility] 10/26 - 10/15/12 by [facility] w/ weakness, poor appetite & intake, electrolyte abn (K=3.3, Ca=10.1, Phos=1.8) & prot/cal malnutrition;  She responded to IV therapy & was covered w/ Rocephin=>Cipro, Urine grew mult species;  She went to [facility] for rehab & is still there- it is clear that pt & husb cannot care for self & family unable to provide adequately, but finances preclude NHP for pt & husb... She notes feeling better, strength better, PT helping...    HBP> on ASA81, Amlod2.5, Lisin5, Demadex20-1/2, Diamox250, & K20/d; BP=122/64 & she is feeling better, still w/ mult somatic complaints...    VI> she knows to elim sodium, elev legs, wear support hose, take the diuretics- we decided to incr Demadex back to 20mg /d...    Chol> on diet alone; she has a very hi HDL ~150!!!    Hypercalcemia> assoc w/ low phos- r/o hyperparathyroidism- see below...    GI- GERD, Divertics> on Prilosec20, Miralax, Senakot-S - all as needed...    GU- saw DrTannenbaum for Urology 10/12- mult complaints, urodynamics done w/ sm hypersens bladder unstable w/ leakage, & large bladder divertic> he rec double voiding technique...    DJD, Osteopenia> off Ca supplements they sent her home from hosp w/ Phos-2Bid...    Senile Dementia> on Aricept10 & Alpraz0.25 as needed... We reviewed prob list, meds, xrays and labs> see below for updates >>  LABS 11/13:  Chems- ok w/ K=3.5 Ca=10.5 Phos=2.6 (2.3-4.6) Mg=2.1.Marland Kitchen.      ~  December 03, 2012:  26mo ROV & Jessica [name] CC= swelling in feet; her wt= 114# up 1# on Demadex20, Diamox250, K10-3/d, and KPhos-Bid; reminded to elim sodium, elev legs, wear support hose...  She is under a lot of stress w/ [place] home, son  helps but works...      We reviewed prob list, meds, xrays and labs> see below for updates >>  LABS 12/13:  Chems- ok w/ K=3.5 Creat=1.1 Ca=10.8 Phos=3.6 PTH=122 (c/w hyperparathyroidism)...  We decided to continue her same meds for now...  ~  January 08, 2013:  26mo ROV & Jessica [name] continues to insist on monthly ROVs- c/o pedal edema about the same as prev, wt stable 114# despite running out of diuretics & we discussed refilling her Demadex20 & Diamox250;  BP stable, O2 sats  are 99% on RA, chest clear, etc...  She persists w/ bladder complaints and she is reminded about the double voiding technique per DrTannenbaum & rec for Urology f/u prn...  ~  March 23, 2013:  2-83mo ROV & Jessica [name] is c/o ankle swelling which tends to go down overnight- we discussed stopping her Amlodipine & redoubling efforts at no salt, elevation, support hose, & Demadex;  also c/o fine rash ?generalized w/ pruritis & we discussed Rx w/ Lotrisone cream & Atarax 25mg  prn... She is once again asking for yet another round of home PT therapy...  We reviewed the following medical problems during today's office visit >>     HBP> on ASA81, Amlod2.5, Lisin5, Demadex20-1/2, Diamox250, & K10-3/d + KPhos-1Bid; BP=100/62 & she is feeling sl weak w/ mult somatic complaints; labs 4/14 show K=3.7, Phos=3.4, Cr=1.1; we decided to STOP the Amlodipine & work on sodium restriction.    VI, edema> she knows to elim sodium, elev legs, wear support hose, take Demadex 20mg /d...    Chol> on diet alone; she has a very hi HDL ~150!!!    Hypercalcemia> assoc w/ low phos- c/w primary hyperparathyroidism- she is 79y/o & not felt to be an operative candidate; she was started on KPhos 10/13 in [facility] & disch on 2Bid w/ improvement in serum Phos to 3.6; serum Ca = 11.2 now but PTH is not rising.    GI- GERD, Divertics> off prev Prilosec20, Miralax, Senakot-S; advised these all as needed...    GU> saw DrTannenbaum for Urology 10/12- mult complaints, urodynamics done w/ sm  hypersens bladder unstable w/ leakage, & large bladder divertic> he rec double voiding technique...    DJD, Osteopenia> off Ca supplements they sent her home from hosp w/ Phos-2Bid...    Senile Dementia> on Aricept10 & Alpraz0.25 as needed... We reviewed prob list, meds, xrays and labs> see below for updates >>  LABS 4/14:  Chems- Ca=11.2 but PTH is sl lower than prev at 86;  Phos=3.4 on KPhos1tabBid; K= 3.7 on this & K10- 2AM+1PM;  LFTs= wnl and Alb= 4.0 on Ensure...  ~  May 20, 2013:  52mo ROV & add-on for swelling> Jessica [name] persists w/ c/o swelling in her legs; ?if she recalls our prev conversation about salt, elevation, support hose; she is taking the Demadex20mg Qam & Diamox250mg  Qpm along w/ her KCl & KPhos=> labs today show: K=3.6, BUN=17, Cr=1.0;  Exam shows 3+edema in feet & lower legs, chest clear, heart w/ Gr1/6 SEM w/o rubs or gallops apprec;  We reviewed the need to Elim salt, Elevate the legs, Wear support hose, & we decided to STOP Lisinopril (BP=110/68) and INCREASE the Demadex20 to 2tabsQam...  ROV in 4-6weeks recheck; hopefully she can keep her visiting nurses, home help, etc (she will not go willingly to a NH or AL facility)...     We reviewed prob list, meds, xrays and labs> see below for updates >>   ~  June 23, 2013:  70mo ROV & Jessica [name] is here w/ her daugh & her husb Jessica [name] (last seen 12/11 7 asked to f/u in 77mo);  Last OV we increased her Demadex to 20-2Qam + her Diamox250/d along w/ her KCl & KPhos- she is improved having lost 3# down to 110# & less edema; BP is improved as well at 120/62;  Her CC today is some burning discomfort in the bottom of her feet esp when standing 7 wealking; we discussed Neuropathy symptoms & she requests med rx trial- try Gabapentin 100->300mg /d...  We  reviewed prob list, meds, xrays and labs> see below for updates >>  Labs 7/14:  Chems- wnl x Ca=10.8, continue same meds...         Problem List:  Hx of ALLERGIES (ICD-995.3) - she uses OTC meds as needed, &  we prev discussed Zyrtek 10mg Qam, Saline nasal mist, & Atrovent Nasal 0.03% Tid for drainage (one of her chronic complaints)...  Hx of ASTHMATIC BRONCHITIS, ACUTE (ICD-466.0) - no recent exacerbations... ~  CXR 5/13 showed borderline heart size, kyphoscoliosis, no edema, and clear lungs... ~  CXR 10/13 showed normal heart size, clear lungs, kyphoscoliosis, osteopenia...  HYPERTENSION (ICD-401.9) >>  ~  controlled on ASA 81mg /d, NORVASC5mg -1/2tab, LISINOPRIL5 & DEMADEX 20mg /d & DIAMOX250mg /d... ~  4/12:  BP= 110/60 and she states that she is taking her meds regularly & tolerating them well... denies HA, visual changes, CP, palpit, change in dyspnea, etc... but notes weakness, intermittently dizzy & persist edema in her feet...  ~  6/12:  BP= 124/70 & she remains stable... ~  8/12:  BP= 110/ 66 & stable... ~  10/12:  BP= 114/62 & stable... ~  1/13:  BP= 110/64 & she remains stable... ~  4/13:  BP= 116/58 & she is reminded to adjust Lasix 1-2 Qam according to her edema... ~  5/13:  BP= 140/72 & she is c/o pedal edema; we decided to change to Demadex 20mg - 2Qam. ~  7/13:  BP= 110/60 & her edema is diminished, still 1+ in feet... Labs showed HCO3=39 & DIAMOX 250mg /d added. ~  8/13:  BP= 118/74 & edema is the same; ?what she's taking? & we decided to simplify regimen> Norvasc1/2, Lisin1/2, Demadex-1/d & Diamox-1/d... ~  9/13:  BP= 110/70 & she is clinically stable on these meds; add K10Bid for the K=3.4 today... ~  EKG 10/13 showed NSR, rate63, RBBB, poss old infarct anterolaterally... ~  11/13:  on ASA81, Amlod2.5, Lisin5, Demadex20-1/2, Diamox250, & K20/d; BP=122/64 & she is feeling better, still w/ mult somatic complaints... ~  12/13:  BP= 124/72 & her edema, weight, etc are about stable... ~  1/14:  BP= 118/60 & she persists w/ mult somatic complaints... ~  4/14:  on ASA81, Amlod2.5, Lisin5, Demadex20-1/2, Diamox250, & K10-3/d + KPhos-1Bid; BP=100/62 & she is feeling sl weak w/ mult somatic  complaints; labs 4/14 show K=3.7, Phos=3.4, Cr=1.1; we decided to STOP the Amlodipine & work on sodium restriction ~  6/14: on ASA81, Lisiin5, Demadex20, Diamox250, & K10-3/d + KPhos-1Bid; BP= 110/68 & 3+edema persists; Labs- OK & we decided to STOP the Lisinopril & INCR the Demadex20-2Qam.... ~  7/14: on  ASA81, Demadex20-2/d, Diamox250, & K10-3/d + KPhos-1Bid; BP= 120/62 & she is improved w/ decr edema...  VENOUS INSUFFICIENCY, CHRONIC (ICD-459.81) - there is mild pedal edema bilat L>R... on low sodium diet, elevation, support hose- prev refused diuretic meds due to urinary symptoms, now on [facility] as well...  HYPERCHOLESTEROLEMIA (ICD-272.0) - prev on Lip10, but she stopped this on her own... she has a very high HDL!!! ~  FLP was 1/08 showed TChol 253, TG 125, HDL 121, LDL 93 ~  FLP 2/09 showed TChol 200, TG 99, HDL 136, LDL 44 ~  FLP 4/11 showed TChol 244, TG 143, HDL 149, LDL 59 ~  FLP 1/12 showed TChol 266, TG 102, HDL 151, LDL 83 >> BEST HDL I'VE EVER SEEN ~  It has been hard to get her back in for FASTING labs...  HYPERCALCEMIA >> routine labs w/ Calcium levels betw 10.2 &  11.7 over the last 34yrs... ~  Labs 7/13 showed Ca= 11.8 &  PTH= 81... For now avoid calc supplements etc... ~  Labs 9/13 showed Ca= 11.0 & she is reminded- no calcium, no VitD... ~  Labs 10/13 Hosp reviewed> Ca was up, Phos was low, K was low> all improved w/ Rx in hosp (they did not repeat PTH level). ~  4/14:   assoc w/ low phos- c/w primary hyperparathyroidism- she is 79y/o & not felt to be an operative candidate; she was started on KPhos 10/13 in [facility] & disch on 2Bid w/ improvement in serum Phos to 3.6; serum Ca = 11.2 now but PTH is not rising.   GERD (ICD-530.81) - last EGD was 9/05 by DrPerry showing GERD and esoph stricture- dilated... she stopped her Nexium but states that her swallowing is good & she uses PRILOSEC OTC as needed.  DIVERTICULOSIS OF COLON (ICD-562.10) - last colonoscopy was 11/00 showing  divertics only... ~  8/12:  She notes some constipation & rec to take [facility] & SENAKOT-S per our usual protocol...  HX, URINARY INFECTION (ICD-V13.02) & INCONTINENCE symptoms... she has seen DrTannenbaum & staff on bladder exercises and she reported some improvement but has persistant symptoms & never followed up... ~  7/10:  offered f/u appt w/ Urology vs second opinion consult but she declines... ~  9/10:  wrote for trial Enablex 7.5mg /d >> no benefit she says. ~  12/10: saw DrNesi- tried sample med, sl improved, but "he turned me over to a lady for longer term treatments" she says... ~  4/12 & 6/12: states DrNesi hasn't been able to help her & she wants appt w/ DrTannenbaum==> seen w/ urodynamic eval revealing a sm hypersens bladder & a large bladder divertic; not a surg candidate, taught double voiding technique... ~  10/12: she had f/u DrTannenbaum & he reinforced prev w/u & need for double voiding technique... ~  10/13: In hosp Urine grew mult species, Rx w/ Roceph=> Cipro...  DEGENERATIVE JOINT DISEASE (ICD-715.90) - notes right shoulder symptoms, but managing OK w/ Mobic 7.5mg  & Tylenol; also right hip & low back pain...  she also c/o neuropathic "burning" in legs but not bad enough for additional meds she says...  OSTEOPOROSIS (ICD-733.00) - she was on Fosamax but had discontinued this on her own... we discussed the need for continued therapy and she was asked to restart Alendronate but she never did...  she takes Ca++, MVI, VitD==> rec to STOP the Calcium.  SENILE DEMENTIA (ICD-290.0) - she takes ASA 81mg /d... she tried Aricept in the past but didn't notice any improvement therefore stopped it. ~  she & her husb still live on their own but are having numerous difficulties while still resisting any thoughts of assisted living etc> got concerned because she saw an impulse in her left antecubital fossa (tortuous brachial art), went to the ER & this got translated to palpitations & lead to  full ER cardiac eval= neg... ~  6/11: MMSE showed 26/30 & she agreed to try DONEPEZIL 10mg /d... ~  6/12:  Still taking Aricept & appears stable... ~  7/13:  No change, same meds, clinically stable...  ANXIETY (ICD-300.00) - she uses Alpraz 0.25mg  as needed.  Hx of SHINGLES (ICD-053.9)   Past Surgical History  Procedure Laterality Date  . Vesicovaginal fistula closure w/ tah    . Tonsillectomy    . Abdominal hysterectomy      Outpatient Encounter Prescriptions as of 06/23/2013  Medication Sig Dispense Refill  .  acetaZOLAMIDE (DIAMOX) 250 MG tablet Take 1 tablet (250 mg total) by mouth daily. At 4 pm  30 tablet  11  . ALPRAZolam (XANAX) 0.25 MG tablet Take 0.125-0.25 mg by mouth 3 (three) times daily as needed. For nerves      . aspirin EC 81 MG tablet Take 81 mg by mouth daily.      . clotrimazole-betamethasone (LOTRISONE) cream Apply topically 2 (two) times daily.  30 g  11  . donepezil (ARICEPT) 5 MG tablet Take 1 tablet (5 mg total) by mouth 2 (two) times daily.  60 tablet  6  . feeding supplement (ENSURE COMPLETE) LIQD Take 237 mLs by mouth 2 (two) times daily between meals.  60 Bottle  0  . hydrOXYzine (ATARAX/VISTARIL) 25 MG tablet Take 1 tablet (25 mg total) by mouth every 4 (four) hours as needed for itching.  50 tablet  5  . Multiple Vitamin (MULTIVITAMIN) tablet Take 1 tablet by mouth daily.        . phosphorus (K PHOS NEUTRAL) 155-852-130 MG tablet Take 1 tablet by mouth 2 (two) times daily.  60 tablet  11  . potassium chloride (K-DUR) 10 MEQ tablet in the morning and at night.      . potassium chloride (K-DUR) 10 MEQ tablet TAKE 2 TABLETS IN THE MORNING AND 1 AT NIGHT  90 tablet  6  . torsemide (DEMADEX) 20 MG tablet Take 2 tablets by mouth every morning  60 tablet  6   No facility-administered encounter medications on file as of 06/23/2013.    No Known Allergies   Current Medications, Allergies, Past Medical History, Past Surgical History, Family History, and  Social History were reviewed in [employer] record.    Review of Systems        See HPI - all other systems neg except as noted...      The patient complains of decreased hearing, dyspnea on exertion, peripheral edema, incontinence, muscle weakness, and difficulty walking.  The patient denies anorexia, fever, weight loss, weight gain, vision loss, hoarseness, chest pain, syncope, prolonged cough, headaches, hemoptysis, abdominal pain, melena, hematochezia, severe indigestion/heartburn, hematuria, suspicious skin lesions, transient blindness, depression, unusual weight change, abnormal bleeding, enlarged lymph nodes, and angioedema.     Objective:   Physical Exam      WD, WN, 79 y/o BF in NAD... GENERAL:  Alert, pleasant & cooperative; she is slow moving as noted... HEENT:  [place]/AT, EOM-full, EACs- some wax, TMs-wnl, NOSE-clear, THROAT-clear & wnl. NECK:  Supple w/ fairROM; no JVD; normal carotid impulses w/o bruits; no thyromegaly or nodules palpated; no lymphadenopathy. CHEST:  Clear to P & A; without wheezes/ rales/ or rhonchi., noted kyphosis... HEART:  Regular Rhythm; Gr1/6 SEM, without rubs or gallops heard... ABDOMEN:  Soft & nontender; normal bowel sounds; no organomegaly or masses detected. EXT:  mod arthritic changes; no varicose veins/ +venous insuffic/ 3+edema now deficits... DERM:  No lesions noted; no rash etc...  MMSE> 06/13/10> Score 26/30 (missed place, 2/3 recall after distraction, & copy figure)...   Assessment & Plan:    Ven Insuffic/ Edema>>  We have had mult conversations at each OV regarding no salt, elevation, support hose; she is on Demadex, Diamox & K supplementation;  Serially she has lost edema fluid & improved on the diuretics...   AR & ASTHMA>  Breathing is stable, at baseline, she has chr rhinorrhea complaints w/ numerous discussions regarding management of this problem....  HBP>  BP is improved  on the diuretic rx  alone...  Electrolye Abn>  See 10/13 Hosp by [facility], it is apparent that her elev calcium is the result of HYPER-PARATHYROIDISM but at age 54 we are reluctant to refer for surg unless progressive...  CHOL>  She has the world's best HDL!!!  Hypercalcemia>  Surely from Hyperpara & she has stopped the calcium supplements, & we are following the labs...  GU>  She saw DrTannenbaum w/ extensive investigation into her voiding symptoms & urodynamics as above; rec to use "double voiding" technique...  DJD/ Osteopenia>  Aware, she is stable on OTC meds, she has repeatedly refuse Bisphos meds for her bones...  Dementia>  She & husb Jessica [name] are still living independently & have been married 62 yrs.  Other medical problems as noted...   Patient's Medications  New Prescriptions   GABAPENTIN (NEURONTIN) 100 MG CAPSULE    1 tablet by mouth daily x 1 week, then 1 tablet by mouth two times daily x 1 week, then 1 tablet by mouth three times daily thereafter.  Previous Medications   ACETAZOLAMIDE (DIAMOX) 250 MG TABLET    Take 1 tablet (250 mg total) by mouth daily. At 4 pm   ALPRAZOLAM (XANAX) 0.25 MG TABLET    Take 0.125-0.25 mg by mouth 3 (three) times daily as needed. For nerves   ASPIRIN EC 81 MG TABLET    Take 81 mg by mouth daily.   CLOTRIMAZOLE-BETAMETHASONE (LOTRISONE) CREAM    Apply topically 2 (two) times daily.   DONEPEZIL (ARICEPT) 5 MG TABLET    Take 1 tablet (5 mg total) by mouth 2 (two) times daily.   FEEDING SUPPLEMENT (ENSURE COMPLETE) LIQD    Take 237 mLs by mouth 2 (two) times daily between meals.   HYDROXYZINE (ATARAX/VISTARIL) 25 MG TABLET    Take 1 tablet (25 mg total) by mouth every 4 (four) hours as needed for itching.   MULTIPLE VITAMIN (MULTIVITAMIN) TABLET    Take 1 tablet by mouth daily.     PHOSPHORUS (K PHOS NEUTRAL) 155-852-130 MG TABLET    Take 1 tablet by mouth 2 (two) times daily.   POTASSIUM CHLORIDE (K-DUR) 10 MEQ TABLET    TAKE 2 TABLETS IN THE MORNING AND 1 AT NIGHT    TORSEMIDE (DEMADEX) 20 MG TABLET    Take 2 tablets by mouth every morning  Modified Medications   No medications on file  Discontinued Medications   POTASSIUM CHLORIDE (K-DUR) 10 MEQ TABLET    in the morning and at night.

## 2013-06-23 NOTE — Patient Instructions (Addendum)
Today we updated your med list in our EPIC system...    Continue your current medications the same...  We decided to add GABAPENTIN 100mg  tabs for the burning pain in your feet...    Start w/ one tab daily for 1 week,     Then increase to one tab twice daily for 1 week,     Then increase to one tab three times daily thereafter...  Today we rechecked your metabolic panel - We will contact you w/ the results when available...   Let's plan a follow up visit in 32mo, sooner if needed for problems.Marland KitchenMarland Kitchen

## 2013-06-29 ENCOUNTER — Telehealth: Payer: Self-pay | Admitting: Pulmonary Disease

## 2013-06-29 NOTE — Telephone Encounter (Signed)
Called and spoke with carol harvey and she is aware that ok to start with home nurse visits x 3 weeks.  Okey Regal stated that she is at the home of the Jessica [name] now and she has taken her pulse and this is irregular and 52.  BP was 92/60 and Jessica [name] is now laying down and she feels better and not having the dizziness at this time.  SN please advise if anything further is needed.  Thanks  No Known Allergies

## 2013-06-29 NOTE — Telephone Encounter (Signed)
Per SN---  Ok for the nurse visits.  Called and spoke with Jessica [name] and she stated that she will be going out again this week and she will follow up with our office on how the pt is doing and she will call and update SN.  Nothing further is needed.

## 2013-07-01 ENCOUNTER — Other Ambulatory Visit: Payer: Self-pay | Admitting: Pulmonary Disease

## 2013-07-14 ENCOUNTER — Telehealth: Payer: Self-pay | Admitting: Pulmonary Disease

## 2013-07-14 NOTE — Telephone Encounter (Signed)
Called and spoke with pt and she stated that [facility] is wanting to [facility].  pts daughter is wanting to set her up an appt there but the pt wanted to see what SN thought about that.  SN please advise. thanks

## 2013-07-15 NOTE — Telephone Encounter (Signed)
Per SN--  Its up to her, if she wants to see this doctor, that will be fine.

## 2013-07-15 NOTE — Telephone Encounter (Signed)
I spoke with pt. She stated she has an appt with them today. She did not need referral.

## 2013-07-27 ENCOUNTER — Telehealth: Payer: Self-pay | Admitting: Pulmonary Disease

## 2013-07-27 DIAGNOSIS — F0390 Unspecified dementia without behavioral disturbance: Secondary | ICD-10-CM

## 2013-07-27 DIAGNOSIS — M810 Age-related osteoporosis without current pathological fracture: Secondary | ICD-10-CM

## 2013-07-27 NOTE — Telephone Encounter (Signed)
I spoke with the pt and she states that she is needing another order sent to get nursing visits. An order was sent a while ago for [facility] but pt states this has expired. I have placed a new order for this. Carron Curie, CMA

## 2013-08-24 ENCOUNTER — Ambulatory Visit (INDEPENDENT_AMBULATORY_CARE_PROVIDER_SITE_OTHER): Payer: Medicare Other | Admitting: Pulmonary Disease

## 2013-08-24 ENCOUNTER — Encounter: Payer: Self-pay | Admitting: Pulmonary Disease

## 2013-08-24 VITALS — BP 116/64 | HR 48 | Temp 98.0°F | Ht 60.0 in | Wt 112.0 lb

## 2013-08-24 DIAGNOSIS — B86 Scabies: Secondary | ICD-10-CM

## 2013-08-24 DIAGNOSIS — I4510 Unspecified right bundle-branch block: Secondary | ICD-10-CM

## 2013-08-24 DIAGNOSIS — R609 Edema, unspecified: Secondary | ICD-10-CM

## 2013-08-24 DIAGNOSIS — I10 Essential (primary) hypertension: Secondary | ICD-10-CM

## 2013-08-24 DIAGNOSIS — Z23 Encounter for immunization: Secondary | ICD-10-CM

## 2013-08-24 DIAGNOSIS — M810 Age-related osteoporosis without current pathological fracture: Secondary | ICD-10-CM

## 2013-08-24 DIAGNOSIS — K219 Gastro-esophageal reflux disease without esophagitis: Secondary | ICD-10-CM

## 2013-08-24 DIAGNOSIS — G589 Mononeuropathy, unspecified: Secondary | ICD-10-CM

## 2013-08-24 DIAGNOSIS — I872 Venous insufficiency (chronic) (peripheral): Secondary | ICD-10-CM

## 2013-08-24 DIAGNOSIS — F0390 Unspecified dementia without behavioral disturbance: Secondary | ICD-10-CM

## 2013-08-24 DIAGNOSIS — M1990 Unspecified osteoarthritis, unspecified site: Secondary | ICD-10-CM

## 2013-08-24 DIAGNOSIS — R269 Unspecified abnormalities of gait and mobility: Secondary | ICD-10-CM

## 2013-08-24 NOTE — Patient Instructions (Addendum)
Today we updated your med list in our EPIC system...    Continue your current medications the same...  Do the skin treatment per Dermatology...  Today we gave you the 2014 Flu vaccine...  Call for any questions...  Let's plan a follow up visit in 2-61mo, sooner if needed for problems.Marland KitchenMarland Kitchen

## 2013-08-24 NOTE — Progress Notes (Signed)
Subjective:    Patient ID: Jessica [name], female    DOB: 06/12/1911, 79 y.o.   MRN: [id number]  HPI 79 y/o BF here for a follow up visit...  She has mult medical problems including:  AR & runny nose;  Asthmatic Bronchitis;  HBP;  Chronic VI & edema;  Hyperchol;  GERD & esoph stricture;  Divertics;  Hx Urinary incontinence & UTIs;  DJD/ Osteoporosis;  Semile Dementia;  Anxiety...  SEE PREV EPIC NOTES FOR EARLIER DATA >>  ~  March 25, 2012:  70mo ROV & Jessica [name] is basically stable at 79 y/o; she notes some intermittent swelling in ankles & occas sharp CP laterally (min tender CWP)...  [name] is currently in rehab at [facility] & I doubt she will be able to cqare for him when the rehab stay is up...  Her CC is "just weak", eating OK, wt= 121#, walks w/ cane, no falls, denies SOB & manages ADLs ok...  We reviewed prob list, meds, xrays & labs>  See below>> LABS 3/13:  Chems- ok x Ca=11.1;  CBC- ok w/ Hg=13.2;  TSH=1.63;  BNP=101 We will need to check PTH in follow up...  ~  May 08, 2012:  6wk ROV & Jessica [name] returns w/ increased swelling in ankles and feet- she has known VI & periph edema that has been refractory to management w/ her attempts at low sodium diet, elevation & support hose; she also takes Lasix 20mg  starting at one Qam & increased to Bid; she notes that swelling tends to go down overnight & is worse late in the day; she has lost 3# to 118# since last OV yet she is very concerned & quite perturbed about the edema...  We discussed the need to check CXR & labs; we will change the Lasix to Demadex 20mg - 2 Qam & follow up 1 month... CXR 5/13 showed borderline heart size, kyphoscoliosis, no edema, and clear lungs... LABS 5/13:  Pending ==> she forgot to go to the lab this day.  ~  June 23, 2012:  6wk ROV & Jessica [name]'s edema is diminished on the Demadex20mg - 2 daily but she prefers to split it up 1AM & 1afternoon due to urination; she has mult additional symptoms, somatic complaints, etc> eg- c/o trouble w/ her  ears w/ a noise "not a ringing, it' a frying" & noted less when active, distracted w/ background noise etc (we discussed tinnitus & masking the sound w/ "white noise");  She also wants additional Physical therapy & balance retraining from [facility]...   daugh notes [name] is still in [place] but they may send him home soon...    We reviewed prob list, meds, xrays and labs> see below>> LABS 7/13:  Chems- HCO3=39, BUN=26, Creat=1.2, Ca=11.8, PTH=81... Rec- add DIAMOX 250mg /d, no calc supplements etc...  ~  July 23, 2012:  30mo ROV & Jessica [name] is doing the best she can at home w/ daughter's help but they have a hard time w/ her meds & pt's dementia... After last visit she had more "therapy" but this too has stopped since she really "couldn't do it" per daughter... Edema has decr but persists in ankles on Demadex20-2/d (?if taking) & Diamox250/d added; note that weight is the same at 116-117# today...     We reviewed prob list, meds, xrays and labs> see below for update >> We decided to simplify her regimen> Norvasc1/2, Lisinopril1/2, Demadex- one daily, Diamox- one daily...  ~  August 25, 2012:  30mo ROV & Jessica [name] states "  Sometimes I feel like I'm doing better" and "today is not one of my worst days"; weight is down 2-3# to 115# today on her Demadex20 & Diamox250; she notes incr stress because [name] is back home & there is a lot on her- son comes in early AM hours, then a lady helps during the day, daugh in the afternoon, meals on wheels, etc...     VI, Edema> feet are still swelling, on Demadex20, Diamox250, no salt/ elevation/ support hose/ etc; labs showed stable renal w/ Cr=1.1 but K=3.4 & we will start K10Bid...    Hypercalcemia> repeat Ca=11.0 (sl improved) and PTH July2013 was 81; we are folowing & plan repeat PTH in 4-85mo; she knows to avoid calcium supplements & stop VitD as well.    Dementia> she is very slow & deliberate; I continue to feel that she & husb need NHP vs AL etc but they  refuse to consider alternative living arrangements... We reviewed prob list, meds, xrays and labs> see below for updates >> LABS 9/13:  Chems- ok w/ K=3.4 (adding K10Bid), BS=90, BUN=17 Cr=1.1, Ca=11.0;  CBC- ok w/ Hg=12.5  ~  October 27, 2012:  59mo ROV & post hosp check> Jessica [name] was [facility] 10/26 - 10/15/12 by [facility] w/ weakness, poor appetite & intake, electrolyte abn (K=3.3, Ca=10.1, Phos=1.8) & prot/cal malnutrition;  She responded to IV therapy & was covered w/ Rocephin=>Cipro, Urine grew mult species;  She went to [facility] for rehab & is still there- it is clear that pt & husb cannot care for self & family unable to provide adequately, but finances preclude NHP for pt & husb... She notes feeling better, strength better, PT helping...    HBP> on ASA81, Amlod2.5, Lisin5, Demadex20-1/2, Diamox250, & K20/d; BP=122/64 & she is feeling better, still w/ mult somatic complaints...    VI> she knows to elim sodium, elev legs, wear support hose, take the diuretics- we decided to incr Demadex back to 20mg /d...    Chol> on diet alone; she has a very hi HDL ~150!!!    Hypercalcemia> assoc w/ low phos- r/o hyperparathyroidism- see below...    GI- GERD, Divertics> on Prilosec20, Miralax, Senakot-S - all as needed...    GU- saw DrTannenbaum for Urology 10/12- mult complaints, urodynamics done w/ sm hypersens bladder unstable w/ leakage, & large bladder divertic> he rec double voiding technique...    DJD, Osteopenia> off Ca supplements they sent her home from hosp w/ Phos-2Bid...    Senile Dementia> on Aricept10 & Alpraz0.25 as needed... We reviewed prob list, meds, xrays and labs> see below for updates >>  LABS 11/13:  Chems- ok w/ K=3.5 Ca=10.5 Phos=2.6 (2.3-4.6) Mg=2.1.Marland Kitchen.      ~  December 03, 2012:  831mo ROV & Jessica [name]'s CC= swelling in feet; her wt= 114# up 1# on Demadex20, Diamox250, K10-3/d, and KPhos-Bid; reminded to elim sodium, elev legs, wear support hose...  She is under a lot of stress w/ [place] home, son  helps but works...      We reviewed prob list, meds, xrays and labs> see below for updates >>  LABS 12/13:  Chems- ok w/ K=3.5 Creat=1.1 Ca=10.8 Phos=3.6 PTH=122 (c/w hyperparathyroidism)...  We decided to continue her same meds for now...  ~  January 08, 2013:  831mo ROV & Senora continues to insist on monthly ROVs- c/o pedal edema about the same as prev, wt stable 114# despite running out of diuretics & we discussed refilling her Demadex20 & Diamox250;  BP stable, O2 sats  are 99% on RA, chest clear, etc...  She persists w/ bladder complaints and she is reminded about the double voiding technique per DrTannenbaum & rec for Urology f/u prn...  ~  March 23, 2013:  2-18mo ROV & Annabell is c/o ankle swelling which tends to go down overnight- we discussed stopping her Amlodipine & redoubling efforts at no salt, elevation, support hose, & Demadex;  also c/o fine rash ?generalized w/ pruritis & we discussed Rx w/ Lotrisone cream & Atarax 25mg  prn... She is once again asking for yet another round of home PT therapy...  We reviewed the following medical problems during today's office visit >>     HBP> on ASA81, Amlod2.5, Lisin5, Demadex20-1/2, Diamox250, & K10-3/d + KPhos-1Bid; BP=100/62 & she is feeling sl weak w/ mult somatic complaints; labs 4/14 show K=3.7, Phos=3.4, Cr=1.1; we decided to STOP the Amlodipine & work on sodium restriction.    VI, edema> she knows to elim sodium, elev legs, wear support hose, take Demadex 20mg /d...    Chol> on diet alone; she has a very hi HDL ~150!!!    Hypercalcemia> assoc w/ low phos- c/w primary hyperparathyroidism- she is 79y/o & not felt to be an operative candidate; she was started on KPhos 10/13 in [facility] & disch on 2Bid w/ improvement in serum Phos to 3.6; serum Ca = 11.2 now but PTH is not rising.    GI- GERD, Divertics> off prev Prilosec20, Miralax, Senakot-S; advised these all as needed...    GU> saw DrTannenbaum for Urology 10/12- mult complaints, urodynamics done w/ sm  hypersens bladder unstable w/ leakage, & large bladder divertic> he rec double voiding technique...    DJD, Osteopenia> off Ca supplements they sent her home from hosp w/ Phos-2Bid...    Senile Dementia> on Aricept10 & Alpraz0.25 as needed... We reviewed prob list, meds, xrays and labs> see below for updates >>  LABS 4/14:  Chems- Ca=11.2 but PTH is sl lower than prev at 86;  Phos=3.4 on KPhos1tabBid; K= 3.7 on this & K10- 2AM+1PM;  LFTs= wnl and Alb= 4.0 on Ensure...  ~  May 20, 2013:  44mo ROV & add-on for swelling> Darleny persists w/ c/o swelling in her legs; ?if she recalls our prev conversation about salt, elevation, support hose; she is taking the Demadex20mg Qam & Diamox250mg  Qpm along w/ her KCl & KPhos=> labs today show: K=3.6, BUN=17, Cr=1.0;  Exam shows 3+edema in feet & lower legs, chest clear, heart w/ Gr1/6 SEM w/o rubs or gallops apprec;  We reviewed the need to Elim salt, Elevate the legs, Wear support hose, & we decided to STOP Lisinopril (BP=110/68) and INCREASE the Demadex20 to 2tabsQam...  ROV in 4-6weeks recheck; hopefully she can keep her visiting nurses, home help, etc (she will not go willingly to a NH or AL facility)...     We reviewed prob list, meds, xrays and labs> see below for updates >>   ~  June 23, 2013:  13mo ROV & Arora is here w/ her daugh & her husb [name] (last seen 12/11 & asked to f/u in 62mo);  Last OV we increased her Demadex to 20-2Qam + her Diamox250/d along w/ her KCl & KPhos- she is improved having lost 3# down to 110# & less edema; BP is improved as well at 120/62;  Her CC today is some burning discomfort in the bottom of her feet esp when standing &walking; we discussed Neuropathy symptoms & she requests med rx trial- try Gabapentin 100->300mg /d...  We reviewed  prob list, meds, xrays and labs> see below for updates >>  Labs 7/14:  Chems- wnl x Ca=10.8, continue same meds...  ~  August 24, 2013:  53mo ROV & Angenette says she was Dx w/ Scabies yest by Vaughan Sine in [place], we  do not have notes; she says she got it from her husb who picked it up while in rehab; her CC is itching & hopefully the Kwell lotion will take care of all this; she states that her neuropathy sx are better on the Gabapentin100Tid; She will turn 79 y/o next month...  BP remains well controlled on diuretics alone & measures 116/64 today... We reviewed prob list, meds, xrays and labs> see below for updates >>  OK 2014 mFLU vaccine today...          Problem List:  Hx of ALLERGIES (ICD-995.3) - she uses OTC meds as needed, & we prev discussed Zyrtek 10mg Qam, Saline nasal mist, & Atrovent Nasal 0.03% Tid for drainage (one of her chronic complaints)...  Hx of ASTHMATIC BRONCHITIS, ACUTE (ICD-466.0) - no recent exacerbations... ~  CXR 5/13 showed borderline heart size, kyphoscoliosis, no edema, and clear lungs... ~  CXR 10/13 showed normal heart size, clear lungs, kyphoscoliosis, osteopenia...  HYPERTENSION (ICD-401.9) >>  ~  controlled on ASA 81mg /d, NORVASC5mg -1/2tab, LISINOPRIL5 & DEMADEX 20mg /d & DIAMOX250mg /d... ~  4/12:  BP= 110/60 and she states that she is taking her meds regularly & tolerating them well... denies HA, visual changes, CP, palpit, change in dyspnea, etc... but notes weakness, intermittently dizzy & persist edema in her feet...  ~  6/12:  BP= 124/70 & she remains stable... ~  8/12:  BP= 110/ 66 & stable... ~  10/12:  BP= 114/62 & stable... ~  1/13:  BP= 110/64 & she remains stable... ~  4/13:  BP= 116/58 & she is reminded to adjust Lasix 1-2 Qam according to her edema... ~  5/13:  BP= 140/72 & she is c/o pedal edema; we decided to change to Demadex 20mg - 2Qam. ~  7/13:  BP= 110/60 & her edema is diminished, still 1+ in feet... Labs showed HCO3=39 & DIAMOX 250mg /d added. ~  8/13:  BP= 118/74 & edema is the same; ?what she's taking? & we decided to simplify regimen> Norvasc1/2, Lisin1/2, Demadex-1/d & Diamox-1/d... ~  9/13:  BP= 110/70 & she is clinically stable on these meds;  add K10Bid for the K=3.4 today... ~  EKG 10/13 showed NSR, rate63, RBBB, poss old infarct anterolaterally... ~  11/13:  on ASA81, Amlod2.5, Lisin5, Demadex20-1/2, Diamox250, & K20/d; BP=122/64 & she is feeling better, still w/ mult somatic complaints... ~  12/13:  BP= 124/72 & her edema, weight, etc are about stable... ~  1/14:  BP= 118/60 & she persists w/ mult somatic complaints... ~  4/14:  on ASA81, Amlod2.5, Lisin5, Demadex20-1/2, Diamox250, & K10-3/d + KPhos-1Bid; BP=100/62 & she is feeling sl weak w/ mult somatic complaints; labs 4/14 show K=3.7, Phos=3.4, Cr=1.1; we decided to STOP the Amlodipine & work on sodium restriction ~  6/14: on ASA81, Lisiin5, Demadex20, Diamox250, & K10-3/d + KPhos-1Bid; BP= 110/68 & 3+edema persists; Labs- OK & we decided to STOP the Lisinopril & INCR the Demadex20-2Qam.... ~  7/14: on  ASA81, Demadex20-2/d, Diamox250, & K10-3/d + KPhos-1Bid; BP= 120/62 & she is improved w/ decr edema... ~  9/14:  BP remains well controlled on diuretics alone & measures 116/64 today..  VENOUS INSUFFICIENCY, CHRONIC (ICD-459.81) - there is mild pedal edema bilat L>R... on low sodium  diet, elevation, support hose- prev refused diuretic meds due to urinary symptoms, now on [facility] as well...  HYPERCHOLESTEROLEMIA (ICD-272.0) - prev on Lip10, but she stopped this on her own... she has a very high HDL!!! ~  FLP was 1/08 showed TChol 253, TG 125, HDL 121, LDL 93 ~  FLP 2/09 showed TChol 200, TG 99, HDL 136, LDL 44 ~  FLP 4/11 showed TChol 244, TG 143, HDL 149, LDL 59 ~  FLP 1/12 showed TChol 266, TG 102, HDL 151, LDL 83 >> BEST HDL I'VE EVER SEEN ~  It has been hard to get her back in for FASTING labs...  HYPERCALCEMIA >> routine labs w/ Calcium levels betw 10.2 & 11.7 over the last 70yrs... ~  Labs 7/13 showed Ca= 11.8 &  PTH= 81... For now avoid calc supplements etc... ~  Labs 9/13 showed Ca= 11.0 & she is reminded- no calcium, no VitD... ~  Labs 10/13 Hosp reviewed> Ca was up,  Phos was low, K was low> all improved w/ Rx in hosp (they did not repeat PTH level). ~  4/14:   assoc w/ low phos- c/w primary hyperparathyroidism- she is 79y/o & not felt to be an operative candidate; she was started on KPhos 10/13 in [facility] & disch on 2Bid w/ improvement in serum Phos to 3.6; serum Ca = 11.2 now but PTH is not rising.   GERD (ICD-530.81) - last EGD was 9/05 by DrPerry showing GERD and esoph stricture- dilated... she stopped her Nexium but states that her swallowing is good & she uses PRILOSEC OTC as needed.  DIVERTICULOSIS OF COLON (ICD-562.10) - last colonoscopy was 11/00 showing divertics only... ~  8/12:  She notes some constipation & rec to take [facility] & SENAKOT-S per our usual protocol...  HX, URINARY INFECTION (ICD-V13.02) & INCONTINENCE symptoms... she has seen DrTannenbaum & staff on bladder exercises and she reported some improvement but has persistant symptoms & never followed up... ~  7/10:  offered f/u appt w/ Urology vs second opinion consult but she declines... ~  9/10:  wrote for trial Enablex 7.5mg /d >> no benefit she says. ~  12/10: saw DrNesi- tried sample med, sl improved, but "he turned me over to a lady for longer term treatments" she says... ~  4/12 & 6/12: states DrNesi hasn't been able to help her & she wants appt w/ DrTannenbaum==> seen w/ urodynamic eval revealing a sm hypersens bladder & a large bladder divertic; not a surg candidate, taught double voiding technique... ~  10/12: she had f/u DrTannenbaum & he reinforced prev w/u & need for double voiding technique... ~  10/13: In hosp Urine grew mult species, Rx w/ Roceph=> Cipro...  DEGENERATIVE JOINT DISEASE (ICD-715.90) - notes right shoulder symptoms, but managing OK w/ Mobic 7.5mg  & Tylenol; also right hip & low back pain...  she also c/o neuropathic "burning" in legs but not bad enough for additional meds she says... ~  8/14: we have a note from [facility] (7 page note reviewed)> pt c/o  bilat foot & ankle pain & burning w/ numbness & tingling; Dx w/ somatic dysfunction in lumbar region; subluxations seen on XRays; given an adjustment... ~  9/14: symptoms improved on Gabapentin 100mg  Tid she says...  OSTEOPOROSIS (ICD-733.00) - she was on Fosamax but had discontinued this on her own... we discussed the need for continued therapy and she was asked to restart Alendronate but she never did...  she takes Ca++, MVI, VitD==> rec to STOP the Calcium.  SENILE DEMENTIA (ICD-290.0) - she takes ASA 81mg /d... she tried Aricept in the past but didn't notice any improvement therefore stopped it. ~  she & her husb still live on their own but are having numerous difficulties while still resisting any thoughts of assisted living etc> got concerned because she saw an impulse in her left antecubital fossa (tortuous brachial art), went to the ER & this got translated to palpitations & lead to full ER cardiac eval= neg... ~  6/11: MMSE showed 26/30 & she agreed to try DONEPEZIL 10mg /d... ~  6/12:  Still taking Aricept & appears stable... ~  7/13:  No change, same meds, clinically stable...  ANXIETY (ICD-300.00) - she uses Alpraz 0.25mg  as needed.  Hx of SHINGLES (ICD-053.9)   Past Surgical History  Procedure Laterality Date  . Vesicovaginal fistula closure w/ tah    . Tonsillectomy    . Abdominal hysterectomy      Outpatient Encounter Prescriptions as of 08/24/2013  Medication Sig Dispense Refill  . acetaZOLAMIDE (DIAMOX) 250 MG tablet TAKE 1 TABLET (250 MG TOTAL) BY MOUTH DAILY AT 4 PM  30 tablet  6  . ALPRAZolam (XANAX) 0.25 MG tablet Take 0.125-0.25 mg by mouth 3 (three) times daily as needed. For nerves      . aspirin EC 81 MG tablet Take 81 mg by mouth daily.      . clotrimazole-betamethasone (LOTRISONE) cream Apply topically 2 (two) times daily.  30 g  11  . donepezil (ARICEPT) 5 MG tablet Take 1 tablet (5 mg total) by mouth 2 (two) times daily.  60 tablet  6  . feeding supplement  (ENSURE COMPLETE) LIQD Take 237 mLs by mouth 2 (two) times daily between meals.  60 Bottle  0  . gabapentin (NEURONTIN) 100 MG capsule 1 tablet by mouth daily x 1 week, then 1 tablet by mouth two times daily x 1 week, then 1 tablet by mouth three times daily thereafter.  90 capsule  5  . hydrOXYzine (ATARAX/VISTARIL) 25 MG tablet Take 1 tablet (25 mg total) by mouth every 4 (four) hours as needed for itching.  50 tablet  5  . Multiple Vitamin (MULTIVITAMIN) tablet Take 1 tablet by mouth daily.        . phosphorus (K PHOS NEUTRAL) 155-852-130 MG tablet Take 1 tablet by mouth 2 (two) times daily.  60 tablet  11  . potassium chloride (K-DUR) 10 MEQ tablet TAKE 2 TABLETS IN THE MORNING AND 1 AT NIGHT  90 tablet  6  . torsemide (DEMADEX) 20 MG tablet Take 2 tablets by mouth every morning  60 tablet  6  . [DISCONTINUED] acetaZOLAMIDE (DIAMOX) 250 MG tablet Take 1 tablet (250 mg total) by mouth daily. At 4 pm  30 tablet  11   No facility-administered encounter medications on file as of 08/24/2013.    No Known Allergies   Current Medications, Allergies, Past Medical History, Past Surgical History, Family History, and Social History were reviewed in [employer] record.    Review of Systems        See HPI - all other systems neg except as noted...      The patient complains of decreased hearing, dyspnea on exertion, peripheral edema, incontinence, muscle weakness, and difficulty walking.  The patient denies anorexia, fever, weight loss, weight gain, vision loss, hoarseness, chest pain, syncope, prolonged cough, headaches, hemoptysis, abdominal pain, melena, hematochezia, severe indigestion/heartburn, hematuria, suspicious skin lesions, transient blindness, depression, unusual weight change, abnormal bleeding,  enlarged lymph nodes, and angioedema.     Objective:   Physical Exam      WD, WN, 79 y/o BF in NAD... GENERAL:  Alert, pleasant & cooperative; she is slow moving as  noted... HEENT:  [place]/AT, EOM-full, EACs- some wax, TMs-wnl, NOSE-clear, THROAT-clear & wnl. NECK:  Supple w/ fairROM; no JVD; normal carotid impulses w/o bruits; no thyromegaly or nodules palpated; no lymphadenopathy. CHEST:  Clear to P & A; without wheezes/ rales/ or rhonchi., noted kyphosis... HEART:  Regular Rhythm; Gr1/6 SEM, without rubs or gallops heard... ABDOMEN:  Soft & nontender; normal bowel sounds; no organomegaly or masses detected. EXT:  mod arthritic changes; no varicose veins/ +venous insuffic/ 3+edema now deficits... DERM:  No lesions noted; no rash etc...  MMSE> 06/13/10> Score 26/30 (missed place, 2/3 recall after distraction, & copy figure)...   Assessment & Plan:    Ven Insuffic/ Edema>>  We have had mult conversations at each OV regarding no salt, elevation, support hose; she is on Demadex, Diamox & K supplementation;  Serially she has lost edema fluid & improved on the diuretics...   AR & ASTHMA>  Breathing is stable, at baseline, she has chr rhinorrhea complaints w/ numerous discussions regarding management of this problem....  HBP>  BP is improved on the diuretic rx alone...  Electrolye Abn>  See 10/13 Hosp by [facility], it is apparent that her elev calcium is the result of HYPER-PARATHYROIDISM but at age 57 we are reluctant to refer for surg unless progressive...  CHOL>  She has the world's best HDL!!!  Hypercalcemia>  Surely from Hyperpara & she has stopped the calcium supplements, & we are following the labs...  GU>  She saw DrTannenbaum w/ extensive investigation into her voiding symptoms & urodynamics as above; rec to use "double voiding" technique...  DJD/ Osteopenia>  Aware, she is stable on OTC meds, she has repeatedly refuse Bisphos meds for her bones...  Dementia>  She & husb [name] are still living independently & have been married 62 yrs.  Other medical problems as noted...   Patient's Medications  New Prescriptions   No medications on file   Previous Medications   ACETAZOLAMIDE (DIAMOX) 250 MG TABLET    TAKE 1 TABLET (250 MG TOTAL) BY MOUTH DAILY AT 4 PM   ALPRAZOLAM (XANAX) 0.25 MG TABLET    Take 0.125-0.25 mg by mouth 3 (three) times daily as needed. For nerves   ASPIRIN EC 81 MG TABLET    Take 81 mg by mouth daily.   CLOTRIMAZOLE-BETAMETHASONE (LOTRISONE) CREAM    Apply topically 2 (two) times daily.   DONEPEZIL (ARICEPT) 5 MG TABLET    Take 1 tablet (5 mg total) by mouth 2 (two) times daily.   FEEDING SUPPLEMENT (ENSURE COMPLETE) LIQD    Take 237 mLs by mouth 2 (two) times daily between meals.   GABAPENTIN (NEURONTIN) 100 MG CAPSULE    1 tablet by mouth daily x 1 week, then 1 tablet by mouth two times daily x 1 week, then 1 tablet by mouth three times daily thereafter.   HYDROXYZINE (ATARAX/VISTARIL) 25 MG TABLET    Take 1 tablet (25 mg total) by mouth every 4 (four) hours as needed for itching.   MULTIPLE VITAMIN (MULTIVITAMIN) TABLET    Take 1 tablet by mouth daily.     PHOSPHORUS (K PHOS NEUTRAL) 155-852-130 MG TABLET    Take 1 tablet by mouth 2 (two) times daily.   POTASSIUM CHLORIDE (K-DUR) 10 MEQ TABLET  TAKE 2 TABLETS IN THE MORNING AND 1 AT NIGHT   TORSEMIDE (DEMADEX) 20 MG TABLET    Take 2 tablets by mouth every morning  Modified Medications   No medications on file  Discontinued Medications   ACETAZOLAMIDE (DIAMOX) 250 MG TABLET    Take 1 tablet (250 mg total) by mouth daily. At 4 pm

## 2013-08-25 ENCOUNTER — Telehealth: Payer: Self-pay | Admitting: Pulmonary Disease

## 2013-08-25 DIAGNOSIS — B86 Scabies: Secondary | ICD-10-CM

## 2013-08-25 NOTE — Telephone Encounter (Signed)
Order was sent to [facility]  Pt aware and states nothing further needed

## 2013-08-25 NOTE — Telephone Encounter (Signed)
Per SN---  Ok to place order with [facility] nurse visits.  thanks

## 2013-08-25 NOTE — Telephone Encounter (Signed)
I spoke with pt. She is wanting a nurse to come into her home to help put her cream all over her body that she has for scabies. She stated she would like for a nurse to come for about 4 weeks. Please advise SN thanks Last 08/24/13

## 2013-08-27 ENCOUNTER — Telehealth: Payer: Self-pay | Admitting: Pulmonary Disease

## 2013-08-27 NOTE — Telephone Encounter (Signed)
Spoke to pt she is aware her order had to go to [place] home [facility]

## 2013-08-27 NOTE — Telephone Encounter (Signed)
Jessica [name] calling back stating they can not do referral.  She states the patient has scabies and they had instructions to help patient put on ointment, but this does not require the skill of a nurse.  She states they can not do this under home health guidelines.

## 2013-08-27 NOTE — Telephone Encounter (Signed)
Medicare will not allow [facility] to go out and apply the lotion.  The pt is aware and  Joyce Gross from [facility] did talk with the pt and the daughter and gave them the names of some home care companies that will be able to help them with this situation. Nothing further is needed.

## 2013-08-27 NOTE — Telephone Encounter (Signed)
LMTCB

## 2013-09-03 ENCOUNTER — Telehealth: Payer: Self-pay | Admitting: Pulmonary Disease

## 2013-09-03 NOTE — Telephone Encounter (Signed)
Called and spoke with pharmacy and they stated that they had to order her phosphorus but they did have all 3 meds and they are ready to be picked up.  i have called and spoke with the pt and she is aware and nothing further is needed.

## 2013-10-01 ENCOUNTER — Telehealth: Payer: Self-pay | Admitting: Pulmonary Disease

## 2013-10-01 NOTE — Telephone Encounter (Signed)
Called and spoke with pt and she stated that she is having some numbness in her left lower leg x 1 week.  She denies any pain.  Pt is aware that SN is out of the office until Monday.  Pt is aware that we will speak with SN and call her back on Monday and pt is ok with this.  She was advised if she starts with any pain or increased in swelling she will need to be evaluated in the urgent care ---ONLY if needed.

## 2013-10-04 NOTE — Telephone Encounter (Signed)
Per SN---  This is likely neuropathy at her age.  And this is why she is on the gabapentin 1 po tid.  Continue this medication.

## 2013-10-04 NOTE — Telephone Encounter (Signed)
Called, spoke with pt - Informed her of below per SN.  She verbalized understanding of this and voiced no further questions or concerns at this time.

## 2013-10-14 ENCOUNTER — Telehealth: Payer: Self-pay | Admitting: Pulmonary Disease

## 2013-10-14 NOTE — Telephone Encounter (Signed)
Called and spoke with pt and she stated that  She recently celebrated her 89th birthday and she stated that she is feeling more anxious at times.  She has the alprazolam that she has been using but not regularly.  i advised her that she can take the alprazolam 1/2 to 1 tablet up to 3 times daily.  Pt is aware that SN has no openings before her appt on 11/10 and she will try taking the alprazolam as prescribed and she will call back for any other concerns.

## 2013-10-25 ENCOUNTER — Encounter: Payer: Self-pay | Admitting: Pulmonary Disease

## 2013-10-25 ENCOUNTER — Ambulatory Visit (INDEPENDENT_AMBULATORY_CARE_PROVIDER_SITE_OTHER): Payer: Medicare Other | Admitting: Pulmonary Disease

## 2013-10-25 ENCOUNTER — Other Ambulatory Visit (INDEPENDENT_AMBULATORY_CARE_PROVIDER_SITE_OTHER): Payer: Medicare Other

## 2013-10-25 VITALS — BP 122/70 | HR 53 | Temp 97.7°F | Ht 60.0 in | Wt 110.8 lb

## 2013-10-25 DIAGNOSIS — I4510 Unspecified right bundle-branch block: Secondary | ICD-10-CM

## 2013-10-25 DIAGNOSIS — G589 Mononeuropathy, unspecified: Secondary | ICD-10-CM

## 2013-10-25 DIAGNOSIS — F411 Generalized anxiety disorder: Secondary | ICD-10-CM

## 2013-10-25 DIAGNOSIS — R609 Edema, unspecified: Secondary | ICD-10-CM

## 2013-10-25 DIAGNOSIS — I872 Venous insufficiency (chronic) (peripheral): Secondary | ICD-10-CM

## 2013-10-25 DIAGNOSIS — I10 Essential (primary) hypertension: Secondary | ICD-10-CM

## 2013-10-25 DIAGNOSIS — F0390 Unspecified dementia without behavioral disturbance: Secondary | ICD-10-CM

## 2013-10-25 DIAGNOSIS — M1990 Unspecified osteoarthritis, unspecified site: Secondary | ICD-10-CM

## 2013-10-25 DIAGNOSIS — R269 Unspecified abnormalities of gait and mobility: Secondary | ICD-10-CM

## 2013-10-25 DIAGNOSIS — M810 Age-related osteoporosis without current pathological fracture: Secondary | ICD-10-CM

## 2013-10-25 LAB — BASIC METABOLIC PANEL
CO2: 28 mEq/L (ref 19–32)
Calcium: 10.3 mg/dL (ref 8.4–10.5)
Chloride: 104 mEq/L (ref 96–112)
Sodium: 141 mEq/L (ref 135–145)

## 2013-10-25 LAB — CBC WITH DIFFERENTIAL/PLATELET
Basophils Relative: 0.8 % (ref 0.0–3.0)
Eosinophils Absolute: 0.1 10*3/uL (ref 0.0–0.7)
Eosinophils Relative: 2.4 % (ref 0.0–5.0)
Hemoglobin: 11.8 g/dL — ABNORMAL LOW (ref 12.0–15.0)
Lymphocytes Relative: 24 % (ref 12.0–46.0)
MCHC: 32.8 g/dL (ref 30.0–36.0)
Neutro Abs: 3.3 10*3/uL (ref 1.4–7.7)
RBC: 4.17 Mil/uL (ref 3.87–5.11)

## 2013-10-25 NOTE — Progress Notes (Signed)
Subjective:    Patient ID: Jessica [name], female    DOB: May 15, 1911, 79 y.o.   MRN: [id number]  HPI 79 y/o BF here for a follow up visit...  She has mult medical problems including:  AR & runny nose;  Asthmatic Bronchitis;  HBP;  Chronic VI & edema;  Hyperchol;  GERD & esoph stricture;  Divertics;  Hx Urinary incontinence & UTIs;  DJD/ Osteoporosis;  Semile Dementia;  Anxiety...  SEE PREV EPIC NOTES FOR EARLIER DATA >>  ~  March 25, 2012:  66mo ROV & [place] is basically stable at 79 y/o; she notes some intermittent swelling in ankles & occas sharp CP laterally (min tender CWP)...  Jessica [name] is currently in rehab at [facility] & I doubt she will be able to cqare for him when the rehab stay is up...  Her CC is "just weak", eating OK, wt= 121#, walks w/ cane, no falls, denies SOB & manages ADLs ok...  We reviewed prob list, meds, xrays & labs>  See below>> LABS 3/13:  Chems- ok x Ca=11.1;  CBC- ok w/ Hg=13.2;  TSH=1.63;  BNP=101 We will need to check PTH in follow up...  ~  May 08, 2012:  6wk ROV & Jessica [name] returns w/ increased swelling in ankles and feet- she has known VI & periph edema that has been refractory to management w/ her attempts at low sodium diet, elevation & support hose; she also takes Lasix 20mg  starting at one Qam & increased to Bid; she notes that swelling tends to go down overnight & is worse late in the day; she has lost 3# to 118# since last OV yet she is very concerned & quite perturbed about the edema...  We discussed the need to check CXR & labs; we will change the Lasix to Demadex 20mg - 2 Qam & follow up 1 month... CXR 5/13 showed borderline heart size, kyphoscoliosis, no edema, and clear lungs... LABS 5/13:  Pending ==> she forgot to go to the lab this day.  ~  June 23, 2012:  6wk ROV & Jessica [name]'s edema is diminished on the Demadex20mg - 2 daily but she prefers to split it up 1AM & 1afternoon due to urination; she has mult additional symptoms, somatic complaints, etc> eg- c/o trouble w/  her ears w/ a noise "not a ringing, it' a frying" & noted less when active, distracted w/ background noise etc (we discussed tinnitus & masking the sound w/ "white noise");  She also wants additional Physical therapy & balance retraining from [facility]...   daugh notes Jessica [name] is still in [place] but they may send him home soon...    We reviewed prob list, meds, xrays and labs> see below>> LABS 7/13:  Chems- HCO3=39, BUN=26, Creat=1.2, Ca=11.8, PTH=81... Rec- add DIAMOX 250mg /d, no calc supplements etc...  ~  July 23, 2012:  30mo ROV & Jessica [name] is doing the best she can at home w/ daughter's help but they have a hard time w/ her meds & pt's dementia... After last visit she had more "therapy" but this too has stopped since she really "couldn't do it" per daughter... Edema has decr but persists in ankles on Demadex20-2/d (?if taking) & Diamox250/d added; note that weight is the same at 116-117# today...     We reviewed prob list, meds, xrays and labs> see below for update >> We decided to simplify her regimen> Norvasc1/2, Lisinopril1/2, Demadex- one daily, Diamox- one daily...  ~  August 25, 2012:  30mo ROV & Jessica [name] states "  Sometimes I feel like I'm doing better" and "today is not one of my worst days"; weight is down 2-3# to 115# today on her Demadex20 & Diamox250; she notes incr stress because Jessica [name] is back home & there is a lot on her- son comes in early AM hours, then a lady helps during the day, daugh in the afternoon, meals on wheels, etc...     VI, Edema> feet are still swelling, on Demadex20, Diamox250, no salt/ elevation/ support hose/ etc; labs showed stable renal w/ Cr=1.1 but K=3.4 & we will start K10Bid...    Hypercalcemia> repeat Ca=11.0 (sl improved) and PTH July2013 was 81; we are folowing & plan repeat PTH in 4-76mo; she knows to avoid calcium supplements & stop VitD as well.    Dementia> she is very slow & deliberate; I continue to feel that she & husb need NHP vs AL etc but they  refuse to consider alternative living arrangements... We reviewed prob list, meds, xrays and labs> see below for updates >> LABS 9/13:  Chems- ok w/ K=3.4 (adding K10Bid), BS=90, BUN=17 Cr=1.1, Ca=11.0;  CBC- ok w/ Hg=12.5  ~  October 27, 2012:  9mo ROV & post hosp check> Jessica [name] was [facility] 10/26 - 10/15/12 by [facility] w/ weakness, poor appetite & intake, electrolyte abn (K=3.3, Ca=10.1, Phos=1.8) & prot/cal malnutrition;  She responded to IV therapy & was covered w/ Rocephin=>Cipro, Urine grew mult species;  She went to [facility] for rehab & is still there- it is clear that pt & husb cannot care for self & family unable to provide adequately, but finances preclude NHP for pt & husb... She notes feeling better, strength better, PT helping...    HBP> on ASA81, Amlod2.5, Lisin5, Demadex20-1/2, Diamox250, & K20/d; BP=122/64 & she is feeling better, still w/ mult somatic complaints...    VI> she knows to elim sodium, elev legs, wear support hose, take the diuretics- we decided to incr Demadex back to 20mg /d...    Chol> on diet alone; she has a very hi HDL ~150!!!    Hypercalcemia> assoc w/ low phos- r/o hyperparathyroidism- see below...    GI- GERD, Divertics> on Prilosec20, Miralax, Senakot-S - all as needed...    GU- saw DrTannenbaum for Urology 10/12- mult complaints, urodynamics done w/ sm hypersens bladder unstable w/ leakage, & large bladder divertic> he rec double voiding technique...    DJD, Osteopenia> off Ca supplements they sent her home from hosp w/ Phos-2Bid...    Senile Dementia> on Aricept10 & Alpraz0.25 as needed... We reviewed prob list, meds, xrays and labs> see below for updates >>  LABS 11/13:  Chems- ok w/ K=3.5 Ca=10.5 Phos=2.6 (2.3-4.6) Mg=2.1.Marland Kitchen.      ~  December 03, 2012:  159mo ROV & Jessica [name]'s CC= swelling in feet; her wt= 114# up 1# on Demadex20, Diamox250, K10-3/d, and KPhos-Bid; reminded to elim sodium, elev legs, wear support hose...  She is under a lot of stress w/ [place] home, son  helps but works...      We reviewed prob list, meds, xrays and labs> see below for updates >>  LABS 12/13:  Chems- ok w/ K=3.5 Creat=1.1 Ca=10.8 Phos=3.6 PTH=122 (c/w hyperparathyroidism)...  We decided to continue her same meds for now...  ~  January 08, 2013:  159mo ROV & Jessica [name] continues to insist on monthly ROVs- c/o pedal edema about the same as prev, wt stable 114# despite running out of diuretics & we discussed refilling her Demadex20 & Diamox250;  BP stable, O2 sats  are 99% on RA, chest clear, etc...  She persists w/ bladder complaints and she is reminded about the double voiding technique per DrTannenbaum & rec for Urology f/u prn...  ~  March 23, 2013:  2-54mo ROV & Jessica [name] is c/o ankle swelling which tends to go down overnight- we discussed stopping her Amlodipine & redoubling efforts at no salt, elevation, support hose, & Demadex;  also c/o fine rash ?generalized w/ pruritis & we discussed Rx w/ Lotrisone cream & Atarax 25mg  prn... She is once again asking for yet another round of home PT therapy...  We reviewed the following medical problems during today's office visit >>     HBP> on ASA81, Amlod2.5, Lisin5, Demadex20-1/2, Diamox250, & K10-3/d + KPhos-1Bid; BP=100/62 & she is feeling sl weak w/ mult somatic complaints; labs 4/14 show K=3.7, Phos=3.4, Cr=1.1; we decided to STOP the Amlodipine & work on sodium restriction.    VI, edema> she knows to elim sodium, elev legs, wear support hose, take Demadex 20mg /d...    Chol> on diet alone; she has a very hi HDL ~150!!!    Hypercalcemia> assoc w/ low phos- c/w primary hyperparathyroidism- she is 79y/o & not felt to be an operative candidate; she was started on KPhos 10/13 in [facility] & disch on 2Bid w/ improvement in serum Phos to 3.6; serum Ca = 11.2 now but PTH is not rising.    GI- GERD, Divertics> off prev Prilosec20, Miralax, Senakot-S; advised these all as needed...    GU> saw DrTannenbaum for Urology 10/12- mult complaints, urodynamics done w/ sm  hypersens bladder unstable w/ leakage, & large bladder divertic> he rec double voiding technique...    DJD, Osteopenia> off Ca supplements they sent her home from hosp w/ Phos-2Bid...    Senile Dementia> on Aricept10 & Alpraz0.25 as needed... We reviewed prob list, meds, xrays and labs> see below for updates >>  LABS 4/14:  Chems- Ca=11.2 but PTH is sl lower than prev at 86;  Phos=3.4 on KPhos1tabBid; K= 3.7 on this & K10- 2AM+1PM;  LFTs= wnl and Alb= 4.0 on Ensure...  ~  May 20, 2013:  105mo ROV & add-on for swelling> Jessica [name] persists w/ c/o swelling in her legs; ?if she recalls our prev conversation about salt, elevation, support hose; she is taking the Demadex20mg Qam & Diamox250mg  Qpm along w/ her KCl & KPhos=> labs today show: K=3.6, BUN=17, Cr=1.0;  Exam shows 3+edema in feet & lower legs, chest clear, heart w/ Gr1/6 SEM w/o rubs or gallops apprec;  We reviewed the need to Elim salt, Elevate the legs, Wear support hose, & we decided to STOP Lisinopril (BP=110/68) and INCREASE the Demadex20 to 2tabsQam...  ROV in 4-6weeks recheck; hopefully she can keep her visiting nurses, home help, etc (she will not go willingly to a NH or AL facility)...     We reviewed prob list, meds, xrays and labs> see below for updates >>   ~  June 23, 2013:  60mo ROV & Jessica [name] is here w/ her daugh & her husb Jessica [name] (last seen 12/11 & asked to f/u in 53mo);  Last OV we increased her Demadex to 20-2Qam + her Diamox250/d along w/ her KCl & KPhos- she is improved having lost 3# down to 110# & less edema; BP is improved as well at 120/62;  Her CC today is some burning discomfort in the bottom of her feet esp when standing &walking; we discussed Neuropathy symptoms & she requests med rx trial- try Gabapentin 100->300mg /d...  We reviewed  prob list, meds, xrays and labs> see below for updates >>  Labs 7/14:  Chems- wnl x Ca=10.8, continue same meds...  ~  August 24, 2013:  33mo ROV & Jessica [name] says she was Dx w/ Scabies yest by Vaughan Sine in [place], we  do not have notes; she says she got it from her husb who picked it up while in rehab; her CC is itching & hopefully the Kwell lotion will take care of all this; she states that her neuropathy sx are better on the Gabapentin100Tid; She will turn 79 y/o next month...  BP remains well controlled on diuretics alone & measures 116/64 today... We reviewed prob list, meds, xrays and labs> see below for updates >>  OK 2014 FLU vaccine today...   ~  October 25, 2013:  33mo ROV & Jessica [name] has turned 79y/o & she framed her letter from Obama; states she's doing fair- same old problems, pain in left shoulder & she wants more PT, but I offered Ortho eval for XRay & poss injection instead...     BP is controlled on ASA81, Demadex20-2Qam, Diamox250, & K10-3/d + KPhos-1Bid (off prev Amlod & Lisin); BP=122/70 & she denies angina pain, palpit, ch in SOB etc; K is sl low at 3.2 7 rec to incr KCl10 to 2Bid...    Serum Calcium id ok at 10.3- no progressive incr & we continue to monitor this parameter...    DJD is treated w/ OTC analgesics and rec to f/u w/ Ortho re: shoulder pain...    She has a senile dementia- on Aricept10 and Alpraz0.25 for prn use... We reviewed prob list, meds, xrays and labs> see below for updates >>  LABS 11/14:  Chems- ok x K=3.2, BUN=24, Cr=1.1;  CBC w/ Hg=11.8;  TSH=2.48...         Problem List:  Hx of ALLERGIES (ICD-995.3) - she uses OTC meds as needed, & we prev discussed Zyrtek 10mg Qam, Saline nasal mist, & Atrovent Nasal 0.03% Tid for drainage (one of her chronic complaints)...  Hx of ASTHMATIC BRONCHITIS, ACUTE (ICD-466.0) - no recent exacerbations... ~  CXR 5/13 showed borderline heart size, kyphoscoliosis, no edema, and clear lungs... ~  CXR 10/13 showed normal heart size, clear lungs, kyphoscoliosis, osteopenia...  HYPERTENSION (ICD-401.9) >>  ~  controlled on ASA 81mg /d, NORVASC5mg -1/2tab, LISINOPRIL5 & DEMADEX 20mg /d & DIAMOX250mg /d... ~  4/12:  BP= 110/60 and she states that she  is taking her meds regularly & tolerating them well... denies HA, visual changes, CP, palpit, change in dyspnea, etc... but notes weakness, intermittently dizzy & persist edema in her feet...  ~  6/12:  BP= 124/70 & she remains stable... ~  8/12:  BP= 110/ 66 & stable... ~  10/12:  BP= 114/62 & stable... ~  1/13:  BP= 110/64 & she remains stable... ~  4/13:  BP= 116/58 & she is reminded to adjust Lasix 1-2 Qam according to her edema... ~  5/13:  BP= 140/72 & she is c/o pedal edema; we decided to change to Demadex 20mg - 2Qam. ~  7/13:  BP= 110/60 & her edema is diminished, still 1+ in feet... Labs showed HCO3=39 & DIAMOX 250mg /d added. ~  8/13:  BP= 118/74 & edema is the same; ?what she's taking? & we decided to simplify regimen> Norvasc1/2, Lisin1/2, Demadex-1/d & Diamox-1/d... ~  9/13:  BP= 110/70 & she is clinically stable on these meds; add K10Bid for the K=3.4 today... ~  EKG 10/13 showed NSR, rate63, RBBB, poss old infarct anterolaterally... ~  11/13:  on ASA81, Amlod2.5, Lisin5, Demadex20-1/2, Diamox250, & K20/d; BP=122/64 & she is feeling better, still w/ mult somatic complaints... ~  12/13:  BP= 124/72 & her edema, weight, etc are about stable... ~  1/14:  BP= 118/60 & she persists w/ mult somatic complaints... ~  4/14:  on ASA81, Amlod2.5, Lisin5, Demadex20-1/2, Diamox250, & K10-3/d + KPhos-1Bid; BP=100/62 & she is feeling sl weak w/ mult somatic complaints; labs 4/14 show K=3.7, Phos=3.4, Cr=1.1; we decided to STOP the Amlodipine & work on sodium restriction ~  6/14: on ASA81, Lisiin5, Demadex20, Diamox250, & K10-3/d + KPhos-1Bid; BP= 110/68 & 3+edema persists; Labs- OK & we decided to STOP the Lisinopril & INCR the Demadex20-2Qam.... ~  7/14: on  ASA81, Demadex20-2/d, Diamox250, & K10-3/d + KPhos-1Bid; BP= 120/62 & she is improved w/ decr edema... ~  9/14:  BP remains well controlled on diuretics alone & measures 116/64 today.. ~  11/14: BP is controlled on ASA81, Demadex20-2Qam,  Diamox250, & K10-3/d + KPhos-1Bid (off prev Amlod & Lisin); BP=122/70 & she denies angina pain, palpit, ch in SOB etc; K is sl low at 3.2 7 rec to incr KCl10 to 2Bid.  VENOUS INSUFFICIENCY, CHRONIC (ICD-459.81) - there is mild pedal edema bilat L>R... on low sodium diet, elevation, support hose- prev refused diuretic meds due to urinary symptoms, now on [facility] as well...  HYPERCHOLESTEROLEMIA (ICD-272.0) - prev on Lip10, but she stopped this on her own... she has a very high HDL!!! ~  FLP was 1/08 showed TChol 253, TG 125, HDL 121, LDL 93 ~  FLP 2/09 showed TChol 200, TG 99, HDL 136, LDL 44 ~  FLP 4/11 showed TChol 244, TG 143, HDL 149, LDL 59 ~  FLP 1/12 showed TChol 266, TG 102, HDL 151, LDL 83 >> BEST HDL I'VE EVER SEEN ~  It has been hard to get her back in for FASTING labs...  HYPERCALCEMIA >> routine labs w/ Calcium levels betw 10.2 & 11.7 over the last 20yrs... ~  Labs 7/13 showed Ca= 11.8 &  PTH= 81... For now avoid calc supplements etc... ~  Labs 9/13 showed Ca= 11.0 & she is reminded- no calcium, no VitD... ~  Labs 10/13 Hosp reviewed> Ca was up, Phos was low, K was low> all improved w/ Rx in hosp (they did not repeat PTH level). ~  4/14:   assoc w/ low phos- c/w primary hyperparathyroidism- she is 79y/o & not felt to be an operative candidate; she was started on KPhos 10/13 in [place] & disch on 2Bid w/ improvement in serum Phos to 3.6; serum Ca = 11.2 now but PTH is not rising.  ~  Labs 11/14 showed Ca=10.3  GERD (ICD-530.81) - last EGD was 9/05 by DrPerry showing GERD and esoph stricture- dilated... she stopped her Nexium but states that her swallowing is good & she uses PRILOSEC OTC as needed.  DIVERTICULOSIS OF COLON (ICD-562.10) - last colonoscopy was 11/00 showing divertics only... ~  8/12:  She notes some constipation & rec to take [facility] & SENAKOT-S per our usual protocol...  HX, URINARY INFECTION (ICD-V13.02) & INCONTINENCE symptoms... she has seen DrTannenbaum & staff on  bladder exercises and she reported some improvement but has persistant symptoms & never followed up... ~  7/10:  offered f/u appt w/ Urology vs second opinion consult but she declines... ~  9/10:  wrote for trial Enablex 7.5mg /d >> no benefit she says. ~  12/10: saw DrNesi- tried sample med, sl improved, but "he turned  me over to a lady for longer term treatments" she says... ~  4/12 & 6/12: states DrNesi hasn't been able to help her & she wants appt w/ DrTannenbaum==> seen w/ urodynamic eval revealing a sm hypersens bladder & a large bladder divertic; not a surg candidate, taught double voiding technique... ~  10/12: she had f/u DrTannenbaum & he reinforced prev w/u & need for double voiding technique... ~  10/13: In hosp Urine grew mult species, Rx w/ Roceph=> Cipro...  DEGENERATIVE JOINT DISEASE (ICD-715.90) - notes right shoulder symptoms, but managing OK w/ Mobic 7.5mg  & Tylenol; also right hip & low back pain...  she also c/o neuropathic "burning" in legs but not bad enough for additional meds she says... ~  8/14: we have a note from [facility] (7 page note reviewed)> pt c/o bilat foot & ankle pain & burning w/ numbness & tingling; Dx w/ somatic dysfunction in lumbar region; subluxations seen on XRays; given an adjustment... ~  9/14: symptoms improved on Gabapentin 100mg  Tid she says...  OSTEOPOROSIS (ICD-733.00) - she was on Fosamax but had discontinued this on her own... we discussed the need for continued therapy and she was asked to restart Alendronate but she never did...  she takes Ca++, MVI, VitD==> rec to STOP the Calcium.  SENILE DEMENTIA (ICD-290.0) - she takes ASA 81mg /d... she tried Aricept in the past but didn't notice any improvement therefore stopped it. ~  she & her husb still live on their own but are having numerous difficulties while still resisting any thoughts of assisted living etc> got concerned because she saw an impulse in her left antecubital fossa (tortuous  brachial art), went to the ER & this got translated to palpitations & lead to full ER cardiac eval= neg... ~  6/11: MMSE showed 26/30 & she agreed to try DONEPEZIL 10mg /d... ~  6/12:  Still taking Aricept & appears stable... ~  7/13:  No change, same meds, clinically stable... ~  7/14:  She remains stable and will be 79 y/o in Oct...  ANXIETY (ICD-300.00) - she uses Alpraz 0.25mg  as needed.  Hx of SHINGLES (ICD-053.9)   Past Surgical History  Procedure Laterality Date  . Vesicovaginal fistula closure w/ tah    . Tonsillectomy    . Abdominal hysterectomy      Outpatient Encounter Prescriptions as of 10/25/2013  Medication Sig  . acetaZOLAMIDE (DIAMOX) 250 MG tablet TAKE 1 TABLET (250 MG TOTAL) BY MOUTH DAILY AT 4 PM  . ALPRAZolam (XANAX) 0.25 MG tablet Take 0.125-0.25 mg by mouth 3 (three) times daily as needed. For nerves  . aspirin EC 81 MG tablet Take 81 mg by mouth daily.  . clotrimazole-betamethasone (LOTRISONE) cream Apply topically 2 (two) times daily.  Marland Kitchen donepezil (ARICEPT) 5 MG tablet Take 1 tablet (5 mg total) by mouth 2 (two) times daily.  . feeding supplement (ENSURE COMPLETE) LIQD Take 237 mLs by mouth 2 (two) times daily between meals.  . gabapentin (NEURONTIN) 100 MG capsule 1 tablet by mouth daily x 1 week, then 1 tablet by mouth two times daily x 1 week, then 1 tablet by mouth three times daily thereafter.  . hydrOXYzine (ATARAX/VISTARIL) 25 MG tablet Take 1 tablet (25 mg total) by mouth every 4 (four) hours as needed for itching.  . Multiple Vitamin (MULTIVITAMIN) tablet Take 1 tablet by mouth daily.    . phosphorus (K PHOS NEUTRAL) [id number] MG tablet Take 1 tablet by mouth 2 (two) times daily.  . potassium chloride (  K-DUR) 10 MEQ tablet TAKE 2 TABLETS IN THE MORNING AND 1 AT NIGHT  . torsemide (DEMADEX) 20 MG tablet Take 2 tablets by mouth every morning    No Known Allergies   Current Medications, Allergies, Past Medical History, Past Surgical History,  Family History, and Social History were reviewed in [employer] record.    Review of Systems        See HPI - all other systems neg except as noted...      The patient complains of decreased hearing, dyspnea on exertion, peripheral edema, incontinence, muscle weakness, and difficulty walking.  The patient denies anorexia, fever, weight loss, weight gain, vision loss, hoarseness, chest pain, syncope, prolonged cough, headaches, hemoptysis, abdominal pain, melena, hematochezia, severe indigestion/heartburn, hematuria, suspicious skin lesions, transient blindness, depression, unusual weight change, abnormal bleeding, enlarged lymph nodes, and angioedema.     Objective:   Physical Exam      WD, WN, 79 y/o BF in NAD... GENERAL:  Alert, pleasant & cooperative; she is slow moving as noted... HEENT:  /AT, EOM-full, EACs- some wax, TMs-wnl, NOSE-clear, THROAT-clear & wnl. NECK:  Supple w/ fairROM; no JVD; normal carotid impulses w/o bruits; no thyromegaly or nodules palpated; no lymphadenopathy. CHEST:  Clear to P & A; without wheezes/ rales/ or rhonchi., noted kyphosis... HEART:  Regular Rhythm; Gr1/6 SEM, without rubs or gallops heard... ABDOMEN:  Soft & nontender; normal bowel sounds; no organomegaly or masses detected. EXT:  mod arthritic changes; no varicose veins/ +venous insuffic/ 3+edema now deficits... DERM:  No lesions noted; no rash etc...  MMSE> 06/13/10> Score 26/30 (missed place, 2/3 recall after distraction, & copy figure)...   Assessment & Plan:    AR & ASTHMA>  Breathing is stable, at baseline, she has chr rhinorrhea complaints w/ numerous discussions regarding management of this problem....  HBP>  BP is improved on the diuretic rx alone...  Ven Insuffic/ Edema>>  We have had mult conversations at each OV regarding no salt, elevation, support hose; she is on Demadex, Diamox & K supplementation;  Serially she has lost edema fluid & improved on the  diuretics...  CHOL>  She has the world's best HDL!!!  Electrolye Abn>  See 10/13 Hosp by [facility], it is apparent that her elev calcium is the result of HYPER-PARATHYROIDISM but at age 31 we are reluctant to refer for surg unless progressive... We are following her serum Calcium level & non-progressive. Hypercalcemia>  Surely from Hyperpara & she has stopped the calcium supplements, & we are following the labs...  GU>  She saw DrTannenbaum w/ extensive investigation into her voiding symptoms & urodynamics as above; rec to use "double voiding" technique...  DJD/ Osteopenia>  Aware, she is stable on OTC meds, she has repeatedly refuse Bisphos meds for her bones...  Dementia>  She & husb Jessica [name] are still living independently & have been married 62 yrs.  Other medical problems as noted...   Patient's Medications  New Prescriptions   No medications on file  Previous Medications   ACETAZOLAMIDE (DIAMOX) 250 MG TABLET    TAKE 1 TABLET (250 MG TOTAL) BY MOUTH DAILY AT 4 PM   ALPRAZOLAM (XANAX) 0.25 MG TABLET    Take 0.125-0.25 mg by mouth 3 (three) times daily as needed. For nerves   ASPIRIN EC 81 MG TABLET    Take 81 mg by mouth daily.   CLOTRIMAZOLE-BETAMETHASONE (LOTRISONE) CREAM    Apply topically 2 (two) times daily.   DONEPEZIL (ARICEPT) 5 MG TABLET  Take 1 tablet (5 mg total) by mouth 2 (two) times daily.   FEEDING SUPPLEMENT (ENSURE COMPLETE) LIQD    Take 237 mLs by mouth 2 (two) times daily between meals.   HYDROXYZINE (ATARAX/VISTARIL) 25 MG TABLET    Take 1 tablet (25 mg total) by mouth every 4 (four) hours as needed for itching.   MULTIPLE VITAMIN (MULTIVITAMIN) TABLET    Take 1 tablet by mouth daily.     PHOSPHORUS (K PHOS NEUTRAL) [id number] MG TABLET    Take 1 tablet by mouth 2 (two) times daily.  Modified Medications   Modified Medication Previous Medication   GABAPENTIN (NEURONTIN) 100 MG CAPSULE gabapentin (NEURONTIN) 100 MG capsule      TAKE ONE CAPSULE DAILY FOR 1 WEEK, 1  CAP TWICE DAILY FOR 1 WEEK, THEN 1 CAP 3 TIMES DAILY    1 tablet by mouth daily x 1 week, then 1 tablet by mouth two times daily x 1 week, then 1 tablet by mouth three times daily thereafter.   POTASSIUM CHLORIDE (K-DUR) 10 MEQ TABLET potassium chloride (K-DUR) 10 MEQ tablet      Take 2 tabs in am and 2 tabs in pm    TAKE 2 TABLETS IN THE MORNING AND 1 AT NIGHT   TORSEMIDE (DEMADEX) 20 MG TABLET torsemide (DEMADEX) 20 MG tablet      TAKE 2 TABLETS BY MOUTH EVERY MORNING    Take 2 tablets by mouth every morning  Discontinued Medications   No medications on file

## 2013-10-25 NOTE — Patient Instructions (Signed)
Today we updated your med list in our EPIC system...    Continue your current medications the same...  Try OTC ZYRTEK for the nasal drip...  Today we did your follow up blood work...    We will contact you w/ the results when available...   We will request a home Physiical therapy assessment...  Call for any questions...  Let's plan a follow up visit in 81mo, sooner if needed for problems.Marland KitchenMarland Kitchen

## 2013-10-26 ENCOUNTER — Other Ambulatory Visit: Payer: Self-pay | Admitting: Pulmonary Disease

## 2013-10-26 MED ORDER — POTASSIUM CHLORIDE ER 10 MEQ PO TBCR: EXTENDED_RELEASE_TABLET | ORAL | Status: DC

## 2013-10-29 ENCOUNTER — Telehealth: Payer: Self-pay | Admitting: Pulmonary Disease

## 2013-10-29 NOTE — Telephone Encounter (Signed)
i gave VO to [facility]. Nothing further needed

## 2013-12-01 ENCOUNTER — Other Ambulatory Visit: Payer: Self-pay | Admitting: Pulmonary Disease

## 2013-12-08 ENCOUNTER — Telehealth: Payer: Self-pay | Admitting: Pulmonary Disease

## 2013-12-08 NOTE — Telephone Encounter (Signed)
LMOM TCB x1 to give verbal order to extend PT order for 1 week.

## 2013-12-08 NOTE — Telephone Encounter (Signed)
Spoke with the Hilda Lias and gave VO to extend PT  Nothing further needed

## 2013-12-15 ENCOUNTER — Other Ambulatory Visit: Payer: Self-pay | Admitting: Pulmonary Disease

## 2013-12-27 ENCOUNTER — Ambulatory Visit (INDEPENDENT_AMBULATORY_CARE_PROVIDER_SITE_OTHER): Payer: Medicare Other | Admitting: Adult Health

## 2013-12-27 ENCOUNTER — Other Ambulatory Visit (INDEPENDENT_AMBULATORY_CARE_PROVIDER_SITE_OTHER): Payer: Medicare Other

## 2013-12-27 ENCOUNTER — Encounter: Payer: Self-pay | Admitting: Adult Health

## 2013-12-27 ENCOUNTER — Ambulatory Visit: Payer: Medicare Other | Admitting: Pulmonary Disease

## 2013-12-27 VITALS — BP 106/70 | HR 54 | Temp 98.9°F | Ht 60.0 in | Wt 112.2 lb

## 2013-12-27 DIAGNOSIS — R609 Edema, unspecified: Secondary | ICD-10-CM

## 2013-12-27 DIAGNOSIS — I10 Essential (primary) hypertension: Secondary | ICD-10-CM

## 2013-12-27 DIAGNOSIS — G589 Mononeuropathy, unspecified: Secondary | ICD-10-CM

## 2013-12-27 DIAGNOSIS — K219 Gastro-esophageal reflux disease without esophagitis: Secondary | ICD-10-CM

## 2013-12-27 LAB — BASIC METABOLIC PANEL
BUN: 18 mg/dL (ref 6–23)
CALCIUM: 10.5 mg/dL (ref 8.4–10.5)
CO2: 30 meq/L (ref 19–32)
Chloride: 105 mEq/L (ref 96–112)
Creatinine, Ser: 1.2 mg/dL (ref 0.4–1.2)
GFR: 53.62 mL/min — ABNORMAL LOW (ref >60.00)
GLUCOSE: 75 mg/dL (ref 70–99)
Potassium: 3.5 mEq/L (ref 3.5–5.1)
Sodium: 143 mEq/L (ref 135–145)

## 2013-12-27 NOTE — Patient Instructions (Signed)
Continue on current regimen  Low salt diet  I will call with labs  Follow up Dr. Kriste Basque  4 months and As needed

## 2013-12-27 NOTE — Assessment & Plan Note (Signed)
Cont on neurontin

## 2013-12-27 NOTE — Assessment & Plan Note (Signed)
Chronic Leg swelling with venous insufficiency   Cont on diurectics no change in rx  bmet today

## 2013-12-27 NOTE — Progress Notes (Signed)
Subjective:    Patient ID: Jessica [name], female    DOB: 1911-07-27, 80 y.o.   MRN: [id number]  HPI 80 y/o BF   She has mult medical problems including:  AR & runny nose;  Asthmatic Bronchitis;  HBP;  Chronic VI & edema;  Hyperchol;  GERD & esoph stricture;  Divertics;  Hx Urinary incontinence & UTIs;  DJD/ Osteoporosis;  Semile Dementia;  Anxiety...  SEE PREV EPIC NOTES FOR EARLIER DATA >>  ~  March 25, 2012:  52mo ROV & Jessica [name] is basically stable at 80 y/o; she notes some intermittent swelling in ankles & occas sharp CP laterally (min tender CWP)...  [name] is currently in rehab at [place] & I doubt she will be able to cqare for him when the rehab stay is up...  Her CC is "just weak", eating OK, wt= 121#, walks w/ cane, no falls, denies SOB & manages ADLs ok...  We reviewed prob list, meds, xrays & labs>  See below>> LABS 3/13:  Chems- ok x Ca=11.1;  CBC- ok w/ Hg=13.2;  TSH=1.63;  BNP=101 We will need to check PTH in follow up...  ~  May 08, 2012:  6wk ROV & Jessica [name] returns w/ increased swelling in ankles and feet- she has known VI & periph edema that has been refractory to management w/ her attempts at low sodium diet, elevation & support hose; she also takes Lasix 20mg  starting at one Qam & increased to Bid; she notes that swelling tends to go down overnight & is worse late in the day; she has lost 3# to 118# since last OV yet she is very concerned & quite perturbed about the edema...  We discussed the need to check CXR & labs; we will change the Lasix to Demadex 20mg - 2 Qam & follow up 1 month... CXR 5/13 showed borderline heart size, kyphoscoliosis, no edema, and clear lungs... LABS 5/13:  Pending ==> she forgot to go to the lab this day.  ~  June 23, 2012:  6wk ROV & Jessica [name]'s edema is diminished on the Demadex20mg - 2 daily but she prefers to split it up 1AM & 1afternoon due to urination; she has mult additional symptoms, somatic complaints, etc> eg- c/o trouble w/ her ears w/ a noise "not a  ringing, it' a frying" & noted less when active, distracted w/ background noise etc (we discussed tinnitus & masking the sound w/ "white noise");  She also wants additional Physical therapy & balance retraining from [facility]...   daugh notes [name] is still in [place] but they may send him home soon...    We reviewed prob list, meds, xrays and labs> see below>> LABS 7/13:  Chems- HCO3=39, BUN=26, Creat=1.2, Ca=11.8, PTH=81... Rec- add DIAMOX 250mg /d, no calc supplements etc...  ~  July 23, 2012:  70mo ROV & Jessica [name] is doing the best she can at home w/ daughter's help but they have a hard time w/ her meds & pt's dementia... After last visit she had more "therapy" but this too has stopped since she really "couldn't do it" per daughter... Edema has decr but persists in ankles on Demadex20-2/d (?if taking) & Diamox250/d added; note that weight is the same at 116-117# today...     We reviewed prob list, meds, xrays and labs> see below for update >> We decided to simplify her regimen> Norvasc1/2, Lisinopril1/2, Demadex- one daily, Diamox- one daily...  ~  August 25, 2012:  70mo ROV & Jessica [name] states "Sometimes I feel like I'm  doing better" and "today is not one of my worst days"; weight is down 2-3# to 115# today on her Demadex20 & Diamox250; she notes incr stress because [name] is back home & there is a lot on her- son comes in early AM hours, then a lady helps during the day, daugh in the afternoon, meals on wheels, etc...     VI, Edema> feet are still swelling, on Demadex20, Diamox250, no salt/ elevation/ support hose/ etc; labs showed stable renal w/ Cr=1.1 but K=3.4 & we will start K10Bid...    Hypercalcemia> repeat Ca=11.0 (sl improved) and PTH July2013 was 81; we are folowing & plan repeat PTH in 4-55mo; she knows to avoid calcium supplements & stop VitD as well.    Dementia> she is very slow & deliberate; I continue to feel that she & husb need NHP vs AL etc but they refuse to consider  alternative living arrangements... We reviewed prob list, meds, xrays and labs> see below for updates >> LABS 9/13:  Chems- ok w/ K=3.4 (adding K10Bid), BS=90, BUN=17 Cr=1.1, Ca=11.0;  CBC- ok w/ Hg=12.5  ~  October 27, 2012:  310mo ROV & post hosp check> Jessica [name] was [facility] 10/26 - 10/15/12 by [facility] w/ weakness, poor appetite & intake, electrolyte abn (K=3.3, Ca=10.1, Phos=1.8) & prot/cal malnutrition;  She responded to IV therapy & was covered w/ Rocephin=>Cipro, Urine grew mult species;  She went to [facility] for rehab & is still there- it is clear that pt & husb cannot care for self & family unable to provide adequately, but finances preclude NHP for pt & husb... She notes feeling better, strength better, PT helping...    HBP> on ASA81, Amlod2.5, Lisin5, Demadex20-1/2, Diamox250, & K20/d; BP=122/64 & she is feeling better, still w/ mult somatic complaints...    VI> she knows to elim sodium, elev legs, wear support hose, take the diuretics- we decided to incr Demadex back to 20mg /d...    Chol> on diet alone; she has a very hi HDL ~150!!!    Hypercalcemia> assoc w/ low phos- r/o hyperparathyroidism- see below...    GI- GERD, Divertics> on Prilosec20, Miralax, Senakot-S - all as needed...    GU- saw DrTannenbaum for Urology 10/12- mult complaints, urodynamics done w/ sm hypersens bladder unstable w/ leakage, & large bladder divertic> he rec double voiding technique...    DJD, Osteopenia> off Ca supplements they sent her home from hosp w/ Phos-2Bid...    Senile Dementia> on Aricept10 & Alpraz0.25 as needed... We reviewed prob list, meds, xrays and labs> see below for updates >>  LABS 11/13:  Chems- ok w/ K=3.5 Ca=10.5 Phos=2.6 (2.3-4.6) Mg=2.1.Marland Kitchen.      ~  December 03, 2012:  10mo ROV & Jessica [name] CC= swelling in feet; her wt= 114# up 1# on Demadex20, Diamox250, K10-3/d, and KPhos-Bid; reminded to elim sodium, elev legs, wear support hose...  She is under a lot of stress w/ [place] home, son helps but works...       We reviewed prob list, meds, xrays and labs> see below for updates >>  LABS 12/13:  Chems- ok w/ K=3.5 Creat=1.1 Ca=10.8 Phos=3.6 PTH=122 (c/w hyperparathyroidism)...  We decided to continue her same meds for now...  ~  January 08, 2013:  10mo ROV & Jessica [name] continues to insist on monthly ROVs- c/o pedal edema about the same as prev, wt stable 114# despite running out of diuretics & we discussed refilling her Demadex20 & Diamox250;  BP stable, O2 sats are 99% on RA, chest  clear, etc...  She persists w/ bladder complaints and she is reminded about the double voiding technique per DrTannenbaum & rec for Urology f/u prn...  ~  March 23, 2013:  2-60mo ROV & Jessica [name] is c/o ankle swelling which tends to go down overnight- we discussed stopping her Amlodipine & redoubling efforts at no salt, elevation, support hose, & Demadex;  also c/o fine rash ?generalized w/ pruritis & we discussed Rx w/ Lotrisone cream & Atarax 25mg  prn... She is once again asking for yet another round of home PT therapy...  We reviewed the following medical problems during today's office visit >>     HBP> on ASA81, Amlod2.5, Lisin5, Demadex20-1/2, Diamox250, & K10-3/d + KPhos-1Bid; BP=100/62 & she is feeling sl weak w/ mult somatic complaints; labs 4/14 show K=3.7, Phos=3.4, Cr=1.1; we decided to STOP the Amlodipine & work on sodium restriction.    VI, edema> she knows to elim sodium, elev legs, wear support hose, take Demadex 20mg /d...    Chol> on diet alone; she has a very hi HDL ~150!!!    Hypercalcemia> assoc w/ low phos- c/w primary hyperparathyroidism- she is 80y/o & not felt to be an operative candidate; she was started on KPhos 10/13 in [facility] & disch on 2Bid w/ improvement in serum Phos to 3.6; serum Ca = 11.2 now but PTH is not rising.    GI- GERD, Divertics> off prev Prilosec20, Miralax, Senakot-S; advised these all as needed...    GU> saw DrTannenbaum for Urology 10/12- mult complaints, urodynamics done w/ sm hypersens bladder  unstable w/ leakage, & large bladder divertic> he rec double voiding technique...    DJD, Osteopenia> off Ca supplements they sent her home from hosp w/ Phos-2Bid...    Senile Dementia> on Aricept10 & Alpraz0.25 as needed... We reviewed prob list, meds, xrays and labs> see below for updates >>  LABS 4/14:  Chems- Ca=11.2 but PTH is sl lower than prev at 86;  Phos=3.4 on KPhos1tabBid; K= 3.7 on this & K10- 2AM+1PM;  LFTs= wnl and Alb= 4.0 on Ensure...  ~  May 20, 2013:  39mo ROV & add-on for swelling> Jessica [name] persists w/ c/o swelling in her legs; ?if she recalls our prev conversation about salt, elevation, support hose; she is taking the Demadex20mg Qam & Diamox250mg  Qpm along w/ her KCl & KPhos=> labs today show: K=3.6, BUN=17, Cr=1.0;  Exam shows 3+edema in feet & lower legs, chest clear, heart w/ Gr1/6 SEM w/o rubs or gallops apprec;  We reviewed the need to Elim salt, Elevate the legs, Wear support hose, & we decided to STOP Lisinopril (BP=110/68) and INCREASE the Demadex20 to 2tabsQam...  ROV in 4-6weeks recheck; hopefully she can keep her visiting nurses, home help, etc (she will not go willingly to a NH or AL facility)...     We reviewed prob list, meds, xrays and labs> see below for updates >>   ~  June 23, 2013:  75mo ROV & Jessica [name] is here w/ her daugh & her husb [name] (last seen 12/11 & asked to f/u in 75mo);  Last OV we increased her Demadex to 20-2Qam + her Diamox250/d along w/ her KCl & KPhos- she is improved having lost 3# down to 110# & less edema; BP is improved as well at 120/62;  Her CC today is some burning discomfort in the bottom of her feet esp when standing &walking; we discussed Neuropathy symptoms & she requests med rx trial- try Gabapentin 100->300mg /d...  We reviewed prob list, meds, xrays and  labs> see below for updates >>  Labs 7/14:  Chems- wnl x Ca=10.8, continue same meds...  ~  August 24, 2013:  68mo ROV & Jessica [name] says she was Dx w/ Scabies yest by Vaughan Sine in [place], we do not have  notes; she says she got it from her husb who picked it up while in rehab; her CC is itching & hopefully the Kwell lotion will take care of all this; she states that her neuropathy sx are better on the Gabapentin100Tid; She will turn 80 y/o next month...  BP remains well controlled on diuretics alone & measures 116/64 today... We reviewed prob list, meds, xrays and labs> see below for updates >>  OK 2014 mFLU vaccine today...   12/27/2013 Follow up  2 month follow up - reports is doing well.  no new complaints.  Would like labs done today. Says overall she feels about the same.  K+ was slightly low last ov and rx was adjusted.  She wants labs repeated today.  No chest pain , orthopnea, edema or n/v/d.  Appetite is fair . No GERD . On diet only  Does have some shoulder soreness on/off. Heat helps.  No known injury.            Problem List:  Hx of ALLERGIES (ICD-995.3) - she uses OTC meds as needed, & we prev discussed Zyrtek 10mg Qam, Saline nasal mist, & Atrovent Nasal 0.03% Tid for drainage (one of her chronic complaints)...  Hx of ASTHMATIC BRONCHITIS, ACUTE (ICD-466.0) - no recent exacerbations... ~  CXR 5/13 showed borderline heart size, kyphoscoliosis, no edema, and clear lungs... ~  CXR 10/13 showed normal heart size, clear lungs, kyphoscoliosis, osteopenia...  HYPERTENSION (ICD-401.9) >>  ~  controlled on ASA 81mg /d, NORVASC5mg -1/2tab, LISINOPRIL5 & DEMADEX 20mg /d & DIAMOX250mg /d... ~  4/12:  BP= 110/60 and she states that she is taking her meds regularly & tolerating them well... denies HA, visual changes, CP, palpit, change in dyspnea, etc... but notes weakness, intermittently dizzy & persist edema in her feet...  ~  6/12:  BP= 124/70 & she remains stable... ~  8/12:  BP= 110/ 66 & stable... ~  10/12:  BP= 114/62 & stable... ~  1/13:  BP= 110/64 & she remains stable... ~  4/13:  BP= 116/58 & she is reminded to adjust Lasix 1-2 Qam according to her edema... ~  5/13:  BP= 140/72  & she is c/o pedal edema; we decided to change to Demadex 20mg - 2Qam. ~  7/13:  BP= 110/60 & her edema is diminished, still 1+ in feet... Labs showed HCO3=39 & DIAMOX 250mg /d added. ~  8/13:  BP= 118/74 & edema is the same; ?what she's taking? & we decided to simplify regimen> Norvasc1/2, Lisin1/2, Demadex-1/d & Diamox-1/d... ~  9/13:  BP= 110/70 & she is clinically stable on these meds; add K10Bid for the K=3.4 today... ~  EKG 10/13 showed NSR, rate63, RBBB, poss old infarct anterolaterally... ~  11/13:  on ASA81, Amlod2.5, Lisin5, Demadex20-1/2, Diamox250, & K20/d; BP=122/64 & she is feeling better, still w/ mult somatic complaints... ~  12/13:  BP= 124/72 & her edema, weight, etc are about stable... ~  1/14:  BP= 118/60 & she persists w/ mult somatic complaints... ~  4/14:  on ASA81, Amlod2.5, Lisin5, Demadex20-1/2, Diamox250, & K10-3/d + KPhos-1Bid; BP=100/62 & she is feeling sl weak w/ mult somatic complaints; labs 4/14 show K=3.7, Phos=3.4, Cr=1.1; we decided to STOP the Amlodipine & work on sodium restriction ~  6/14:  on ASA81, Lisiin5, Demadex20, Diamox250, & K10-3/d + KPhos-1Bid; BP= 110/68 & 3+edema persists; Labs- OK & we decided to STOP the Lisinopril & INCR the Demadex20-2Qam.... ~  7/14: on  ASA81, Demadex20-2/d, Diamox250, & K10-3/d + KPhos-1Bid; BP= 120/62 & she is improved w/ decr edema... ~  9/14:  BP remains well controlled on diuretics alone & measures 116/64 today..  VENOUS INSUFFICIENCY, CHRONIC (ICD-459.81) - there is mild pedal edema bilat L>R... on low sodium diet, elevation, support hose- prev refused diuretic meds due to urinary symptoms, now on [facility] as well...  HYPERCHOLESTEROLEMIA (ICD-272.0) - prev on Lip10, but she stopped this on her own... she has a very high HDL!!! ~  FLP was 1/08 showed TChol 253, TG 125, HDL 121, LDL 93 ~  FLP 2/09 showed TChol 200, TG 99, HDL 136, LDL 44 ~  FLP 4/11 showed TChol 244, TG 143, HDL 149, LDL 59 ~  FLP 1/12 showed TChol 266, TG  102, HDL 151, LDL 83 >> BEST HDL I'VE EVER SEEN ~  It has been hard to get her back in for FASTING labs...  HYPERCALCEMIA >> routine labs w/ Calcium levels betw 10.2 & 11.7 over the last 42yrs... ~  Labs 7/13 showed Ca= 11.8 &  PTH= 81... For now avoid calc supplements etc... ~  Labs 9/13 showed Ca= 11.0 & she is reminded- no calcium, no VitD... ~  Labs 10/13 Hosp reviewed> Ca was up, Phos was low, K was low> all improved w/ Rx in hosp (they did not repeat PTH level). ~  4/14:   assoc w/ low phos- c/w primary hyperparathyroidism- she is 80y/o & not felt to be an operative candidate; she was started on KPhos 10/13 in [facility] & disch on 2Bid w/ improvement in serum Phos to 3.6; serum Ca = 11.2 now but PTH is not rising.   GERD (ICD-530.81) - last EGD was 9/05 by DrPerry showing GERD and esoph stricture- dilated... she stopped her Nexium but states that her swallowing is good & she uses PRILOSEC OTC as needed.  DIVERTICULOSIS OF COLON (ICD-562.10) - last colonoscopy was 11/00 showing divertics only... ~  8/12:  She notes some constipation & rec to take [facility] & SENAKOT-S per our usual protocol...  HX, URINARY INFECTION (ICD-V13.02) & INCONTINENCE symptoms... she has seen DrTannenbaum & staff on bladder exercises and she reported some improvement but has persistant symptoms & never followed up... ~  7/10:  offered f/u appt w/ Urology vs second opinion consult but she declines... ~  9/10:  wrote for trial Enablex 7.5mg /d >> no benefit she says. ~  12/10: saw DrNesi- tried sample med, sl improved, but "he turned me over to a lady for longer term treatments" she says... ~  4/12 & 6/12: states DrNesi hasn't been able to help her & she wants appt w/ DrTannenbaum==> seen w/ urodynamic eval revealing a sm hypersens bladder & a large bladder divertic; not a surg candidate, taught double voiding technique... ~  10/12: she had f/u DrTannenbaum & he reinforced prev w/u & need for double voiding technique... ~   10/13: In hosp Urine grew mult species, Rx w/ Roceph=> Cipro...  DEGENERATIVE JOINT DISEASE (ICD-715.90) - notes right shoulder symptoms, but managing OK w/ Mobic 7.5mg  & Tylenol; also right hip & low back pain...  she also c/o neuropathic "burning" in legs but not bad enough for additional meds she says... ~  8/14: we have a note from [facility] (7 page note reviewed)> pt c/o bilat  foot & ankle pain & burning w/ numbness & tingling; Dx w/ somatic dysfunction in lumbar region; subluxations seen on XRays; given an adjustment... ~  9/14: symptoms improved on Gabapentin 100mg  Tid she says...  OSTEOPOROSIS (ICD-733.00) - she was on Fosamax but had discontinued this on her own... we discussed the need for continued therapy and she was asked to restart Alendronate but she never did...  she takes Ca++, MVI, VitD==> rec to STOP the Calcium.  SENILE DEMENTIA (ICD-290.0) - she takes ASA 81mg /d... she tried Aricept in the past but didn't notice any improvement therefore stopped it. ~  she & her husb still live on their own but are having numerous difficulties while still resisting any thoughts of assisted living etc> got concerned because she saw an impulse in her left antecubital fossa (tortuous brachial art), went to the ER & this got translated to palpitations & lead to full ER cardiac eval= neg... ~  6/11: MMSE showed 26/30 & she agreed to try DONEPEZIL 10mg /d... ~  6/12:  Still taking Aricept & appears stable... ~  7/13:  No change, same meds, clinically stable...  ANXIETY (ICD-300.00) - she uses Alpraz 0.25mg  as needed.  Hx of SHINGLES (ICD-053.9)   Past Surgical History  Procedure Laterality Date  . Vesicovaginal fistula closure w/ tah    . Tonsillectomy    . Abdominal hysterectomy      Outpatient Encounter Prescriptions as of 12/27/2013  Medication Sig  . acetaZOLAMIDE (DIAMOX) 250 MG tablet TAKE 1 TABLET (250 MG TOTAL) BY MOUTH DAILY AT 4 PM  . ALPRAZolam (XANAX) 0.25 MG tablet  Take 0.125-0.25 mg by mouth 3 (three) times daily as needed. For nerves  . aspirin EC 81 MG tablet Take 81 mg by mouth daily.  Marland Kitchen donepezil (ARICEPT) 5 MG tablet Take 1 tablet (5 mg total) by mouth 2 (two) times daily.  . feeding supplement (ENSURE COMPLETE) LIQD Take 237 mLs by mouth 2 (two) times daily between meals.  . gabapentin (NEURONTIN) 100 MG capsule TAKE ONE CAPSULE DAILY FOR 1 WEEK, 1 CAP TWICE DAILY FOR 1 WEEK, THEN 1 CAP 3 TIMES DAILY  . Multiple Vitamin (MULTIVITAMIN) tablet Take 1 tablet by mouth daily.    . phosphorus (K PHOS NEUTRAL) [id number] MG tablet Take 1 tablet by mouth 2 (two) times daily.  . potassium chloride (K-DUR) 10 MEQ tablet Take 2 tabs in am and 2 tabs in pm  . torsemide (DEMADEX) 20 MG tablet TAKE 2 TABLETS BY MOUTH EVERY MORNING  . clotrimazole-betamethasone (LOTRISONE) cream Apply topically 2 (two) times daily.  . hydrOXYzine (ATARAX/VISTARIL) 25 MG tablet Take 1 tablet (25 mg total) by mouth every 4 (four) hours as needed for itching.    No Known Allergies   Current Medications, Allergies, Past Medical History, Past Surgical History, Family History, and Social History were reviewed in [employer] record.    Review of Systems        See HPI - all other systems neg except as noted...      The patient complains of decreased hearing, dyspnea on exertion, peripheral edema, incontinence, muscle weakness, and difficulty walking.  The patient denies anorexia, fever, weight loss, weight gain, vision loss, hoarseness, chest pain, syncope, prolonged cough, headaches, hemoptysis, abdominal pain, melena, hematochezia, severe indigestion/heartburn, hematuria, suspicious skin lesions, transient blindness, depression, unusual weight change, abnormal bleeding, enlarged lymph nodes, and angioedema.     Objective:   Physical Exam      WD, WN, 80 y/o  BF in NAD... GENERAL:  Alert, pleasant & cooperative; she is slow moving as noted... HEENT:   [place]/AT, EOM-full, TMs-wnl, NOSE-clear, THROAT-clear & wnl. NECK:  Supple w/ fairROM; no JVD; normal carotid impulses w/o bruits; no thyromegaly or nodules palpated; no lymphadenopathy. CHEST:  Clear to P & A; without wheezes/ rales/ or rhonchi., noted kyphosis... HEART:  Regular Rhythm; Gr1/6 SEM, without rubs or gallops heard... ABDOMEN:  Soft & nontender; normal bowel sounds; no organomegaly or masses detected. EXT:  mod arthritic changes; no varicose veins/ +venous insuffic/ 2-3+edema now deficits... DERM:  No lesions noted; no rash etc...    Assessment & Plan:

## 2013-12-27 NOTE — Assessment & Plan Note (Signed)
Controlled on rx  GERD diet  

## 2014-01-05 ENCOUNTER — Other Ambulatory Visit: Payer: Self-pay | Admitting: Pulmonary Disease

## 2014-01-18 ENCOUNTER — Other Ambulatory Visit: Payer: Self-pay | Admitting: Pulmonary Disease

## 2014-02-08 ENCOUNTER — Telehealth: Payer: Self-pay | Admitting: Pulmonary Disease

## 2014-02-08 MED ORDER — ACETAZOLAMIDE 250 MG PO TABS: ORAL_TABLET | ORAL | Status: DC

## 2014-02-08 NOTE — Telephone Encounter (Signed)
Spoke with patient-aware of Rx refill sent electronically to [facility].

## 2014-02-28 ENCOUNTER — Encounter: Payer: Self-pay | Admitting: Podiatry

## 2014-02-28 ENCOUNTER — Ambulatory Visit (INDEPENDENT_AMBULATORY_CARE_PROVIDER_SITE_OTHER): Payer: Medicare Other | Admitting: Podiatry

## 2014-02-28 VITALS — BP 132/60 | HR 52 | Resp 18 | Ht 64.0 in | Wt 112.0 lb

## 2014-02-28 DIAGNOSIS — B351 Tinea unguium: Secondary | ICD-10-CM

## 2014-02-28 DIAGNOSIS — M79609 Pain in unspecified limb: Secondary | ICD-10-CM

## 2014-02-28 NOTE — Progress Notes (Signed)
   Subjective:    Patient ID: Jessica [name], female    DOB: [birth year], 80 y.o.   MRN: [id number] Pt present for debridement of B/L 1 - 5 toenails. HPI    Review of Systems     Objective:   Physical Exam        Assessment & Plan:

## 2014-03-02 NOTE — Progress Notes (Signed)
Subjective:     Patient ID: Jessica [name], female   DOB: June 02, 1911, 80 y.o.   MRN: [id number]  HPI patient presents with nail disease that she states she cannot take care of herself 1-5 both feet. She is in a wheelchair and presents with caregiver   Review of Systems     Objective:   Physical Exam Patient is found to have thick nailbeds 1-5 both feet that are painful when pressed from a dorsal direction    Assessment:     Chronic mycotic nail infection with pain 1-5 both feet    Plan:     Debridement painful nailbeds 1-5 both feet with no bleeding noted

## 2014-03-13 ENCOUNTER — Other Ambulatory Visit: Payer: Self-pay | Admitting: Pulmonary Disease

## 2014-03-15 ENCOUNTER — Ambulatory Visit (INDEPENDENT_AMBULATORY_CARE_PROVIDER_SITE_OTHER): Payer: Medicare Other | Admitting: Pulmonary Disease

## 2014-03-15 ENCOUNTER — Encounter: Payer: Self-pay | Admitting: Pulmonary Disease

## 2014-03-15 VITALS — BP 106/64 | HR 53 | Temp 97.9°F | Ht 60.0 in | Wt 113.2 lb

## 2014-03-15 DIAGNOSIS — R531 Weakness: Secondary | ICD-10-CM

## 2014-03-15 DIAGNOSIS — I4510 Unspecified right bundle-branch block: Secondary | ICD-10-CM

## 2014-03-15 DIAGNOSIS — K219 Gastro-esophageal reflux disease without esophagitis: Secondary | ICD-10-CM

## 2014-03-15 DIAGNOSIS — I872 Venous insufficiency (chronic) (peripheral): Secondary | ICD-10-CM

## 2014-03-15 DIAGNOSIS — M1990 Unspecified osteoarthritis, unspecified site: Secondary | ICD-10-CM

## 2014-03-15 DIAGNOSIS — F0390 Unspecified dementia without behavioral disturbance: Secondary | ICD-10-CM

## 2014-03-15 DIAGNOSIS — R269 Unspecified abnormalities of gait and mobility: Secondary | ICD-10-CM

## 2014-03-15 DIAGNOSIS — G589 Mononeuropathy, unspecified: Secondary | ICD-10-CM

## 2014-03-15 DIAGNOSIS — R609 Edema, unspecified: Secondary | ICD-10-CM

## 2014-03-15 DIAGNOSIS — R32 Unspecified urinary incontinence: Secondary | ICD-10-CM

## 2014-03-15 DIAGNOSIS — K5730 Diverticulosis of large intestine without perforation or abscess without bleeding: Secondary | ICD-10-CM

## 2014-03-15 DIAGNOSIS — M810 Age-related osteoporosis without current pathological fracture: Secondary | ICD-10-CM

## 2014-03-15 DIAGNOSIS — I10 Essential (primary) hypertension: Secondary | ICD-10-CM

## 2014-03-15 NOTE — Progress Notes (Signed)
Subjective:    Patient ID: Jessica [name], female    DOB: 01/19/1911, 80 y.o.   MRN: [id number]  HPI 80 y/o BF here for a follow up visit...  She has mult medical problems including:  AR & runny nose;  Asthmatic Bronchitis;  HBP;  Chronic VI & edema;  Hyperchol;  GERD & esoph stricture;  Divertics;  Hx Urinary incontinence & UTIs;  DJD/ Osteoporosis;  Semile Dementia;  Anxiety...  SEE PREV EPIC NOTES FOR EARLIER DATA >>  ~  August 25, 2012:  47mo ROV & Jessica [name] states "Sometimes I feel like I'm doing better" and "today is not one of my worst days"; weight is down 2-3# to 115# today on her Demadex20 & Diamox250; she notes incr stress because [name] is back home & there is a lot on her- son comes in early AM hours, then a lady helps during the day, daugh in the afternoon, meals on wheels, etc...     VI, Edema> feet are still swelling, on Demadex20, Diamox250, no salt/ elevation/ support hose/ etc; labs showed stable renal w/ Cr=1.1 but K=3.4 & we will start K10Bid...    Hypercalcemia> repeat Ca=11.0 (sl improved) and PTH July2013 was 81; we are folowing & plan repeat PTH in 4-9mo; she knows to avoid calcium supplements & stop VitD as well.    Dementia> she is very slow & deliberate; I continue to feel that she & husb need NHP vs AL etc but they refuse to consider alternative living arrangements... We reviewed prob list, meds, xrays and labs> see below for updates >> LABS 9/13:  Chems- ok w/ K=3.4 (adding K10Bid), BS=90, BUN=17 Cr=1.1, Ca=11.0;  CBC- ok w/ Hg=12.5  ~  October 27, 2012:  60mo ROV & post hosp check> Jessica [name] was [facility] 10/26 - 10/15/12 by [facility] w/ weakness, poor appetite & intake, electrolyte abn (K=3.3, Ca=10.1, Phos=1.8) & prot/cal malnutrition;  She responded to IV therapy & was covered w/ Rocephin=>Cipro, Urine grew mult species;  She went to [facility] for rehab & is still there- it is clear that pt & husb cannot care for self & family unable to provide adequately, but finances preclude  NHP for pt & husb... She notes feeling better, strength better, PT helping...    HBP> on ASA81, Amlod2.5, Lisin5, Demadex20-1/2, Diamox250, & K20/d; BP=122/64 & she is feeling better, still w/ mult somatic complaints...    VI> she knows to elim sodium, elev legs, wear support hose, take the diuretics- we decided to incr Demadex back to 20mg /d...    Chol> on diet alone; she has a very hi HDL ~150!!!    Hypercalcemia> assoc w/ low phos- r/o hyperparathyroidism- see below...    GI- GERD, Divertics> on Prilosec20, Miralax, Senakot-S - all as needed...    GU- saw DrTannenbaum for Urology 10/12- mult complaints, urodynamics done w/ sm hypersens bladder unstable w/ leakage, & large bladder divertic> he rec double voiding technique...    DJD, Osteopenia> off Ca supplements they sent her home from hosp w/ Phos-2Bid...    Senile Dementia> on Aricept10 & Alpraz0.25 as needed... We reviewed prob list, meds, xrays and labs> see below for updates >>  LABS 11/13:  Chems- ok w/ K=3.5 Ca=10.5 Phos=2.6 (2.3-4.6) Mg=2.1.Marland Kitchen.      ~  December 03, 2012:  64mo ROV & Jessica [name]'s CC= swelling in feet; her wt= 114# up 1# on Demadex20, Diamox250, K10-3/d, and KPhos-Bid; reminded to elim sodium, elev legs, wear support hose...  She is under a lot of  stress w/ [name] home, son helps but works...      We reviewed prob list, meds, xrays and labs> see below for updates >>  LABS 12/13:  Chems- ok w/ K=3.5 Creat=1.1 Ca=10.8 Phos=3.6 PTH=122 (c/w hyperparathyroidism)...  We decided to continue her same meds for now...  ~  January 08, 2013:  39mo ROV & Jessica [name] continues to insist on monthly ROVs- c/o pedal edema about the same as prev, wt stable 114# despite running out of diuretics & we discussed refilling her Demadex20 & Diamox250;  BP stable, O2 sats are 99% on RA, chest clear, etc...  She persists w/ bladder complaints and she is reminded about the double voiding technique per DrTannenbaum & rec for Urology f/u prn...  ~  March 23, 2013:   2-139mo ROV & Jessica [name] is c/o ankle swelling which tends to go down overnight- we discussed stopping her Amlodipine & redoubling efforts at no salt, elevation, support hose, & Demadex;  also c/o fine rash ?generalized w/ pruritis & we discussed Rx w/ Lotrisone cream & Atarax 25mg  prn... She is once again asking for yet another round of home PT therapy...  We reviewed the following medical problems during today's office visit >>     HBP> on ASA81, Amlod2.5, Lisin5, Demadex20-1/2, Diamox250, & K10-3/d + KPhos-1Bid; BP=100/62 & she is feeling sl weak w/ mult somatic complaints; labs 4/14 show K=3.7, Phos=3.4, Cr=1.1; we decided to STOP the Amlodipine & work on sodium restriction.    VI, edema> she knows to elim sodium, elev legs, wear support hose, take Demadex 20mg /d...    Chol> on diet alone; she has a very hi HDL ~150!!!    Hypercalcemia> assoc w/ low phos- c/w primary hyperparathyroidism- she is 80y/o & not felt to be an operative candidate; she was started on KPhos 10/13 in [facility] & disch on 2Bid w/ improvement in serum Phos to 3.6; serum Ca = 11.2 now but PTH is not rising.    GI- GERD, Divertics> off prev Prilosec20, Miralax, Senakot-S; advised these all as needed...    GU> saw DrTannenbaum for Urology 10/12- mult complaints, urodynamics done w/ sm hypersens bladder unstable w/ leakage, & large bladder divertic> he rec double voiding technique...    DJD, Osteopenia> off Ca supplements they sent her home from hosp w/ Phos-2Bid...    Senile Dementia> on Aricept10 & Alpraz0.25 as needed... We reviewed prob list, meds, xrays and labs> see below for updates >>  LABS 4/14:  Chems- Ca=11.2 but PTH is sl lower than prev at 86;  Phos=3.4 on KPhos1tabBid; K= 3.7 on this & K10- 2AM+1PM;  LFTs= wnl and Alb= 4.0 on Ensure...  ~  May 20, 2013:  34mo ROV & add-on for swelling> Jessica [name] persists w/ c/o swelling in her legs; ?if she recalls our prev conversation about salt, elevation, support hose; she is taking the  Demadex20mg Qam & Diamox250mg  Qpm along w/ her KCl & KPhos=> labs today show: K=3.6, BUN=17, Cr=1.0;  Exam shows 3+edema in feet & lower legs, chest clear, heart w/ Gr1/6 SEM w/o rubs or gallops apprec;  We reviewed the need to Elim salt, Elevate the legs, Wear support hose, & we decided to STOP Lisinopril (BP=110/68) and INCREASE the Demadex20 to 2tabsQam...  ROV in 4-6weeks recheck; hopefully she can keep her visiting nurses, home help, etc (she will not go willingly to a NH or AL facility)...     We reviewed prob list, meds, xrays and labs> see below for updates >>   ~  June 23, 2013:  127mo ROV & Jessica [name] is here w/ her daugh & her husb [name] (last seen 12/11 & asked to f/u in 127mo);  Last OV we increased her Demadex to 20-2Qam + her Diamox250/d along w/ her KCl & KPhos- she is improved having lost 3# down to 110# & less edema; BP is improved as well at 120/62;  Her CC today is some burning discomfort in the bottom of her feet esp when standing &walking; we discussed Neuropathy symptoms & she requests med rx trial- try Gabapentin 100->300mg /d...  We reviewed prob list, meds, xrays and labs> see below for updates >>  Labs 7/14:  Chems- wnl x Ca=10.8, continue same meds...  ~  August 24, 2013:  65mo ROV & Jessica [name] says she was Dx w/ Scabies yest by Vaughan Sine [place], we do not have notes; she says she got it from her husb who picked it up while in rehab; her CC is itching & hopefully the Kwell lotion will take care of all this; she states that her neuropathy sx are better on the Gabapentin100Tid; She will turn 80 y/o next month...  BP remains well controlled on diuretics alone & measures 116/64 today... We reviewed prob list, meds, xrays and labs> see below for updates >>  OK 2014 FLU vaccine today...   ~  October 25, 2013:  65mo ROV & Jessica [name] has turned 80y/o & she framed her letter from [place]; states she's doing fair- same old problems, pain in left shoulder & she wants more PT, but I offered Ortho eval for XRay &  poss injection instead...     BP is controlled on ASA81, Demadex20-2Qam, Diamox250, & K10-3/d + KPhos-1Bid (off prev Amlod & Lisin); BP=122/70 & she denies angina pain, palpit, ch in SOB etc; K is sl low at 3.2 7 rec to incr KCl10 to 2Bid...    Serum Calcium id ok at 10.3- no progressive incr & we continue to monitor this parameter...    DJD is treated w/ OTC analgesics and rec to f/u w/ Ortho re: shoulder pain...    She has a senile dementia- on Aricept10 and Alpraz0.25 for prn use... We reviewed prob list, meds, xrays and labs> see below for updates >>  LABS 11/14:  Chems- ok x K=3.2, BUN=24, Cr=1.1;  CBC w/ Hg=11.8;  TSH=2.48...  ~  March 15, 2014:  27mo ROV & Jessica [name] has the same chr complaints- generally stable, notes leaking bladder (prev eval DrTannenbaum), burning feet (saw Podiatry-DrRegal), occas indigestion... She notes that she will be getting Laser Rx for her eyes from [facility], & now she wants ENT referral to check her ears for lavage...     She notes that her breathing is fine- no recent resp exac, cough, sputum, ch in SOB, etc...    BP= 106/64 on Demadex20Qam & Diamox250Qpm w/ K10-2Bid and KPhos1Bid; see labs below...    She ambulates w/ cane & walker, denies CP/ palpit/ ch in edema...     She notes some urinary incont & wears depends, no burning etc...    She remains on Aricept5Bid- she prefers it this way & won't change... We reviewed prob list, meds, xrays and labs> see below for updates >> she did not bring med list or med bottles & reminded to bring bottles to every OV...  LABS 1/15:  Chems- ok w/ K=3.5, Cr=1.2         Problem List:  Hx of ALLERGIES (ICD-995.3) - she uses OTC meds as needed, &  we prev discussed Zyrtek 10mg Qam, Saline nasal mist, & Atrovent Nasal 0.03% Tid for drainage (one of her chronic complaints)...  Hx of ASTHMATIC BRONCHITIS, ACUTE (ICD-466.0) - no recent exacerbations... ~  CXR 5/13 showed borderline heart size, kyphoscoliosis, no edema, and clear  lungs... ~  CXR 10/13 showed normal heart size, clear lungs, kyphoscoliosis, osteopenia...  HYPERTENSION (ICD-401.9) >>  ~  controlled on ASA 81mg /d, NORVASC5mg -1/2tab, LISINOPRIL5 & DEMADEX 20mg /d & DIAMOX250mg /d... ~  4/12:  BP= 110/60 and she states that she is taking her meds regularly & tolerating them well... denies HA, visual changes, CP, palpit, change in dyspnea, etc... but notes weakness, intermittently dizzy & persist edema in her feet...  ~  6/12:  BP= 124/70 & she remains stable... ~  8/12:  BP= 110/ 66 & stable... ~  10/12:  BP= 114/62 & stable... ~  1/13:  BP= 110/64 & she remains stable... ~  4/13:  BP= 116/58 & she is reminded to adjust Lasix 1-2 Qam according to her edema... ~  5/13:  BP= 140/72 & she is c/o pedal edema; we decided to change to Demadex 20mg - 2Qam. ~  7/13:  BP= 110/60 & her edema is diminished, still 1+ in feet... Labs showed HCO3=39 & DIAMOX 250mg /d added. ~  8/13:  BP= 118/74 & edema is the same; ?what she's taking? & we decided to simplify regimen> Norvasc1/2, Lisin1/2, Demadex-1/d & Diamox-1/d... ~  9/13:  BP= 110/70 & she is clinically stable on these meds; add K10Bid for the K=3.4 today... ~  EKG 10/13 showed NSR, rate63, RBBB, poss old infarct anterolaterally... ~  11/13:  on ASA81, Amlod2.5, Lisin5, Demadex20-1/2, Diamox250, & K20/d; BP=122/64 & she is feeling better, still w/ mult somatic complaints... ~  12/13:  BP= 124/72 & her edema, weight, etc are about stable... ~  1/14:  BP= 118/60 & she persists w/ mult somatic complaints... ~  4/14:  on ASA81, Amlod2.5, Lisin5, Demadex20-1/2, Diamox250, & K10-3/d + KPhos-1Bid; BP=100/62 & she is feeling sl weak w/ mult somatic complaints; labs 4/14 show K=3.7, Phos=3.4, Cr=1.1; we decided to STOP the Amlodipine & work on sodium restriction ~  6/14: on ASA81, Lisiin5, Demadex20, Diamox250, & K10-3/d + KPhos-1Bid; BP= 110/68 & 3+edema persists; Labs- OK & we decided to STOP the Lisinopril & INCR the  Demadex20-2Qam.... ~  7/14: on  ASA81, Demadex20-2/d, Diamox250, & K10-3/d + KPhos-1Bid; BP= 120/62 & she is improved w/ decr edema... ~  9/14:  BP remains well controlled on diuretics alone & measures 116/64 today.. ~  11/14: BP is controlled on ASA81, Demadex20-2Qam, Diamox250, & K10-3/d + KPhos-1Bid (off prev Amlod & Lisin); BP=122/70 & she denies angina pain, palpit, ch in SOB etc; K is sl low at 3.2 7 rec to incr KCl10 to 2Bid. ~  3/15: BP= 106/64 on Demadex20Qam & Diamox250Qpm w/ K10-2Bid and KPhos1Bid...  VENOUS INSUFFICIENCY, CHRONIC (ICD-459.81) - there is mild pedal edema bilat L>R... on low sodium diet, elevation, support hose- prev refused diuretic meds due to urinary symptoms, now on [facility] as well...  HYPERCHOLESTEROLEMIA (ICD-272.0) - prev on Lip10, but she stopped this on her own... she has a very high HDL!!! ~  FLP was 1/08 showed TChol 253, TG 125, HDL 121, LDL 93 ~  FLP 2/09 showed TChol 200, TG 99, HDL 136, LDL 44 ~  FLP 4/11 showed TChol 244, TG 143, HDL 149, LDL 59 ~  FLP 1/12 showed TChol 266, TG 102, HDL 151, LDL 83 >> BEST HDL I'VE EVER SEEN ~  It has been hard to get her back in for FASTING labs...  HYPERCALCEMIA >> routine labs w/ Calcium levels betw 10.2 & 11.7 over the last 75yrs... ~  Labs 7/13 showed Ca= 11.8 &  PTH= 81... For now avoid calc supplements etc... ~  Labs 9/13 showed Ca= 11.0 & she is reminded- no calcium, no VitD... ~  Labs 10/13 Hosp reviewed> Ca was up, Phos was low, K was low> all improved w/ Rx in hosp (they did not repeat PTH level). ~  4/14:   assoc w/ low phos- c/w primary hyperparathyroidism- she is 80y/o & not felt to be an operative candidate; she was started on KPhos 10/13 in [place] & disch on 2Bid w/ improvement in serum Phos to 3.6; serum Ca = 11.2 now but PTH is not rising.  ~  Labs 11/14 showed Ca=10.3 ~  Labs 1/15 showed Ca= 10.5  GERD (ICD-530.81) - last EGD was 9/05 by DrPerry showing GERD and esoph stricture- dilated... she stopped  her Nexium but states that her swallowing is good & she uses PRILOSEC OTC as needed.  DIVERTICULOSIS OF COLON (ICD-562.10) - last colonoscopy was 11/00 showing divertics only... ~  8/12:  She notes some constipation & rec to take [facility] & SENAKOT-S per our usual protocol...  HX, URINARY INFECTION (ICD-V13.02) & INCONTINENCE symptoms... she has seen DrTannenbaum & staff on bladder exercises and she reported some improvement but has persistant symptoms & never followed up... ~  7/10:  offered f/u appt w/ Urology vs second opinion consult but she declines... ~  9/10:  wrote for trial Enablex 7.5mg /d >> no benefit she says. ~  12/10: saw DrNesi- tried sample med, sl improved, but "he turned me over to a lady for longer term treatments" she says... ~  4/12 & 6/12: states DrNesi hasn't been able to help her & she wants appt w/ DrTannenbaum==> seen w/ urodynamic eval revealing a sm hypersens bladder & a large bladder divertic; not a surg candidate, taught double voiding technique... ~  10/12: she had f/u DrTannenbaum & he reinforced prev w/u & need for double voiding technique... ~  10/13: In hosp Urine grew mult species, Rx w/ Roceph=> Cipro...  DEGENERATIVE JOINT DISEASE (ICD-715.90) - notes right shoulder symptoms, but managing OK w/ Mobic 7.5mg  & Tylenol; also right hip & low back pain...  she also c/o neuropathic "burning" in legs but not bad enough for additional meds she says... ~  8/14: we have a note from [facility] (7 page note reviewed)> pt c/o bilat foot & ankle pain & burning w/ numbness & tingling; Dx w/ somatic dysfunction in lumbar region; subluxations seen on XRays; given an adjustment... ~  9/14: symptoms improved on Gabapentin 100mg  Tid she says...  OSTEOPOROSIS (ICD-733.00) - she was on Fosamax but had discontinued this on her own... we discussed the need for continued therapy and she was asked to restart Alendronate but she never did...  she takes Ca++, MVI, VitD==> rec to  STOP the Calcium.  SENILE DEMENTIA (ICD-290.0) - she takes ASA 81mg /d... she tried Aricept in the past but didn't notice any improvement therefore stopped it. ~  she & her husb still live on their own but are having numerous difficulties while still resisting any thoughts of assisted living etc> got concerned because she saw an impulse in her left antecubital fossa (tortuous brachial art), went to the ER & this got translated to palpitations & lead to full ER cardiac eval= neg... ~  6/11: MMSE  showed 26/30 & she agreed to try DONEPEZIL 10mg /d... ~  6/12:  Still taking Aricept & appears stable... ~  7/13:  No change, same meds, clinically stable... ~  7/14:  She remains stable and will be [age] y/o in Oct... ~  3/15:  She remains on Aricept5Bid- she prefers it this way & won't change...  ANXIETY (ICD-300.00) - she uses Alpraz 0.25mg  as needed.  Hx of SHINGLES (ICD-053.9)   Past Surgical History  Procedure Laterality Date  . Vesicovaginal fistula closure w/ tah    . Tonsillectomy    . Abdominal hysterectomy      Outpatient Encounter Prescriptions as of 03/15/2014  Medication Sig  . acetaZOLAMIDE (DIAMOX) 250 MG tablet TAKE 1 TABLET (250 MG TOTAL) BY MOUTH DAILY AT 4 PM  . ALPRAZolam (XANAX) 0.25 MG tablet Take 0.125-0.25 mg by mouth 3 (three) times daily as needed. For nerves  . aspirin EC 81 MG tablet Take 81 mg by mouth daily.  . clotrimazole-betamethasone (LOTRISONE) cream Apply topically 2 (two) times daily.  Marland Kitchen. donepezil (ARICEPT) 5 MG tablet TAKE 1 TABLET (5 MG TOTAL) BY MOUTH 2 (TWO) TIMES DAILY.  . feeding supplement (ENSURE COMPLETE) LIQD Take 237 mLs by mouth 2 (two) times daily between meals.  . gabapentin (NEURONTIN) 100 MG capsule TAKE ONE CAPSULE DAILY FOR 1 WEEK, 1 CAP TWICE DAILY FOR 1 WEEK, THEN 1 CAP 3 TIMES DAILY  . hydrOXYzine (ATARAX/VISTARIL) 25 MG tablet Take 1 tablet (25 mg total) by mouth every 4 (four) hours as needed for itching.  . Multiple Vitamin (MULTIVITAMIN)  tablet Take 1 tablet by mouth daily.    Marland Kitchen. PHOSPHA 250 NEUTRAL [id number] MG tablet TAKE 1 TABLET BY MOUTH 2 (TWO) TIMES DAILY.  Marland Kitchen. potassium chloride (K-DUR) 10 MEQ tablet Take 2 tabs in am and 2 tabs in pm  . torsemide (DEMADEX) 20 MG tablet TAKE 2 TABLETS BY MOUTH EVERY MORNING    No Known Allergies   Current Medications, Allergies, Past Medical History, Past Surgical History, Family History, and Social History were reviewed in [facility] electronic medical record.    Review of Systems        See HPI - all other systems neg except as noted...      The patient complains of decreased hearing, dyspnea on exertion, peripheral edema, incontinence, muscle weakness, and difficulty walking.  The patient denies anorexia, fever, weight loss, weight gain, vision loss, hoarseness, chest pain, syncope, prolonged cough, headaches, hemoptysis, abdominal pain, melena, hematochezia, severe indigestion/heartburn, hematuria, suspicious skin lesions, transient blindness, depression, unusual weight change, abnormal bleeding, enlarged lymph nodes, and angioedema.     Objective:   Physical Exam      WD, WN, 80 y/o BF in NAD... GENERAL:  Alert, pleasant & cooperative; she is slow moving as noted... HEENT:  [place]/AT, EOM-full, EACs- some wax, TMs-wnl, NOSE-clear, THROAT-clear & wnl. NECK:  Supple w/ fairROM; no JVD; normal carotid impulses w/o bruits; no thyromegaly or nodules palpated; no lymphadenopathy. CHEST:  Clear to P & A; without wheezes/ rales/ or rhonchi., noted kyphosis... HEART:  Regular Rhythm; Gr1/6 SEM, without rubs or gallops heard... ABDOMEN:  Soft & nontender; normal bowel sounds; no organomegaly or masses detected. EXT:  mod arthritic changes; no varicose veins/ +venous insuffic/ 3+edema now deficits... DERM:  No lesions noted; no rash etc...  MMSE> 06/13/10> Score 26/30 (missed place, 2/3 recall after distraction, & copy figure)...   Assessment & Plan:    AR & ASTHMA>  Breathing  is stable,  at baseline, she has chr rhinorrhea complaints w/ numerous discussions regarding management of this problem....  HBP>  BP is improved on the diuretic rx alone...  Ven Insuffic/ Edema>>  We have had mult conversations at each OV regarding no salt, elevation, support hose; she is on Demadex, Diamox & K supplementation;  Serially she has lost edema fluid & improved on the diuretics...  CHOL>  She has the world's best HDL!!!  Electrolye Abn>  See 10/13 Hosp by [facility], it is apparent that her elev calcium is the result of HYPER-PARATHYROIDISM but at age 10 we are reluctant to refer for surg unless progressive... We are following her serum Calcium level & non-progressive. Hypercalcemia>  Surely from Hyperpara & she has stopped the calcium supplements, & we are following the labs...  GU>  She saw DrTannenbaum w/ extensive investigation into her voiding symptoms & urodynamics as above; rec to use "double voiding" technique...  DJD/ Osteopenia>  Aware, she is stable on OTC meds, she has repeatedly refuse Bisphos meds for her bones...  Dementia>  She & husb [name] are still living independently & have been married 62 yrs.  Other medical problems as noted...   Patient's Medications  New Prescriptions   No medications on file  Previous Medications   ACETAZOLAMIDE (DIAMOX) 250 MG TABLET    TAKE 1 TABLET (250 MG TOTAL) BY MOUTH DAILY AT 4 PM   ALPRAZOLAM (XANAX) 0.25 MG TABLET    Take 0.125-0.25 mg by mouth 3 (three) times daily as needed. For nerves   ASPIRIN EC 81 MG TABLET    Take 81 mg by mouth daily.   CLOTRIMAZOLE-BETAMETHASONE (LOTRISONE) CREAM    Apply topically 2 (two) times daily.   DONEPEZIL (ARICEPT) 5 MG TABLET    TAKE 1 TABLET (5 MG TOTAL) BY MOUTH 2 (TWO) TIMES DAILY.   FEEDING SUPPLEMENT (ENSURE COMPLETE) LIQD    Take 237 mLs by mouth 2 (two) times daily between meals.   GABAPENTIN (NEURONTIN) 100 MG CAPSULE    TAKE ONE CAPSULE DAILY FOR 1 WEEK, 1 CAP TWICE DAILY FOR 1 WEEK,  THEN 1 CAP 3 TIMES DAILY   MULTIPLE VITAMIN (MULTIVITAMIN) TABLET    Take 1 tablet by mouth daily.     PHOSPHA 250 NEUTRAL [id number] MG TABLET    TAKE 1 TABLET BY MOUTH 2 (TWO) TIMES DAILY.   POTASSIUM CHLORIDE (K-DUR) 10 MEQ TABLET    Take 2 tabs in am and 2 tabs in pm   TORSEMIDE (DEMADEX) 20 MG TABLET    TAKE 2 TABLETS BY MOUTH EVERY MORNING  Modified Medications   Modified Medication Previous Medication   ACETAZOLAMIDE (DIAMOX) 250 MG TABLET acetaZOLAMIDE (DIAMOX) 250 MG tablet      TAKE 1 TABLET BY MOUTH DAILY AT 4 PM    TAKE 1 TABLET (250 MG TOTAL) BY MOUTH DAILY AT 4 PM  Discontinued Medications   HYDROXYZINE (ATARAX/VISTARIL) 25 MG TABLET    Take 1 tablet (25 mg total) by mouth every 4 (four) hours as needed for itching.

## 2014-03-15 NOTE — Patient Instructions (Signed)
Today we updated your med list in our EPIC system...    Continue your current medications the same...    We refilled the meds you requested...  Call for any questions...  Let's plan a follow up visit in 2-44mo, sooner if needed for problems.Marland KitchenMarland Kitchen

## 2014-04-05 ENCOUNTER — Other Ambulatory Visit: Payer: Self-pay | Admitting: Pulmonary Disease

## 2014-04-05 ENCOUNTER — Telehealth: Payer: Self-pay | Admitting: Pulmonary Disease

## 2014-04-05 NOTE — Telephone Encounter (Signed)
According to Epic refill was sent on 02-08-14 x 6 refills. I called [facility] to verify and they have it on file and will get it ready for pt. Pt is aware. Carron Curie, CMA

## 2014-04-18 ENCOUNTER — Telehealth: Payer: Self-pay | Admitting: Pulmonary Disease

## 2014-04-18 MED ORDER — TORSEMIDE 20 MG PO TABS: ORAL_TABLET | ORAL | Status: DC

## 2014-04-18 NOTE — Telephone Encounter (Signed)
I called spoke with pt. Aware I have sent RX into the pharm. Nothing further needed

## 2014-04-27 ENCOUNTER — Telehealth: Payer: Self-pay | Admitting: Pulmonary Disease

## 2014-04-27 MED ORDER — POTASSIUM CHLORIDE ER 10 MEQ PO TBCR: EXTENDED_RELEASE_TABLET | ORAL | Status: DC

## 2014-04-27 NOTE — Telephone Encounter (Signed)
Rx has been sent in. Pt is aware. Nothing further is needed. 

## 2014-04-28 ENCOUNTER — Telehealth: Payer: Self-pay | Admitting: Pulmonary Disease

## 2014-04-28 NOTE — Telephone Encounter (Signed)
Spoke with the pt  She states wanted refill on klor con, but her bottle says potassium on it  I advised that klor con is potassium  She verbalized understanding and nothing further needed

## 2014-05-05 ENCOUNTER — Telehealth: Payer: Self-pay | Admitting: Pulmonary Disease

## 2014-05-05 MED ORDER — DONEPEZIL HCL 5 MG PO TABS: ORAL_TABLET | ORAL | Status: DC

## 2014-05-05 NOTE — Telephone Encounter (Signed)
Called and spoke with pt and she is aware of refills that the pharmacy will get ready for her.  Nothing further is needed.

## 2014-05-17 ENCOUNTER — Other Ambulatory Visit: Payer: Self-pay | Admitting: Pulmonary Disease

## 2014-05-18 ENCOUNTER — Ambulatory Visit: Payer: Medicare Other | Admitting: Pulmonary Disease

## 2014-05-20 ENCOUNTER — Other Ambulatory Visit (INDEPENDENT_AMBULATORY_CARE_PROVIDER_SITE_OTHER): Payer: Medicare Other

## 2014-05-20 ENCOUNTER — Ambulatory Visit (INDEPENDENT_AMBULATORY_CARE_PROVIDER_SITE_OTHER): Payer: Medicare Other | Admitting: Pulmonary Disease

## 2014-05-20 ENCOUNTER — Encounter: Payer: Self-pay | Admitting: Pulmonary Disease

## 2014-05-20 VITALS — BP 100/60 | HR 48 | Temp 98.8°F | Ht 60.0 in | Wt 115.0 lb

## 2014-05-20 DIAGNOSIS — K219 Gastro-esophageal reflux disease without esophagitis: Secondary | ICD-10-CM

## 2014-05-20 DIAGNOSIS — F419 Anxiety disorder, unspecified: Secondary | ICD-10-CM

## 2014-05-20 DIAGNOSIS — K5730 Diverticulosis of large intestine without perforation or abscess without bleeding: Secondary | ICD-10-CM

## 2014-05-20 DIAGNOSIS — I10 Essential (primary) hypertension: Secondary | ICD-10-CM

## 2014-05-20 DIAGNOSIS — F411 Generalized anxiety disorder: Secondary | ICD-10-CM

## 2014-05-20 DIAGNOSIS — R269 Unspecified abnormalities of gait and mobility: Secondary | ICD-10-CM

## 2014-05-20 DIAGNOSIS — R5381 Other malaise: Secondary | ICD-10-CM

## 2014-05-20 DIAGNOSIS — R5383 Other fatigue: Secondary | ICD-10-CM

## 2014-05-20 DIAGNOSIS — I872 Venous insufficiency (chronic) (peripheral): Secondary | ICD-10-CM

## 2014-05-20 DIAGNOSIS — G589 Mononeuropathy, unspecified: Secondary | ICD-10-CM

## 2014-05-20 DIAGNOSIS — R531 Weakness: Secondary | ICD-10-CM

## 2014-05-20 DIAGNOSIS — R32 Unspecified urinary incontinence: Secondary | ICD-10-CM

## 2014-05-20 DIAGNOSIS — F0390 Unspecified dementia without behavioral disturbance: Secondary | ICD-10-CM

## 2014-05-20 DIAGNOSIS — I4510 Unspecified right bundle-branch block: Secondary | ICD-10-CM

## 2014-05-20 DIAGNOSIS — M1990 Unspecified osteoarthritis, unspecified site: Secondary | ICD-10-CM

## 2014-05-20 DIAGNOSIS — R609 Edema, unspecified: Secondary | ICD-10-CM

## 2014-05-20 LAB — CBC WITH DIFFERENTIAL/PLATELET
BASOS ABS: 0.1 10*3/uL (ref 0.0–0.1)
Basophils Relative: 1.2 % (ref 0.0–3.0)
Eosinophils Absolute: 0.1 10*3/uL (ref 0.0–0.7)
Eosinophils Relative: 1.3 % (ref 0.0–5.0)
HCT: 37.8 % (ref 36.0–46.0)
Hemoglobin: 12.3 g/dL (ref 12.0–15.0)
LYMPHS ABS: 1.3 10*3/uL (ref 0.7–4.0)
Lymphocytes Relative: 25.3 % (ref 12.0–46.0)
MCHC: 32.4 g/dL (ref 30.0–36.0)
MCV: 87.5 fl (ref 78.0–100.0)
MONO ABS: 0.5 10*3/uL (ref 0.1–1.0)
Monocytes Relative: 9.4 % (ref 3.0–12.0)
Neutro Abs: 3.2 10*3/uL (ref 1.4–7.7)
Neutrophils Relative %: 62.8 % (ref 43.0–77.0)
PLATELETS: 215 10*3/uL (ref 150.0–400.0)
RBC: 4.32 Mil/uL (ref 3.87–5.11)
RDW: 15.1 % (ref 11.5–15.5)
WBC: 5.1 10*3/uL (ref 4.0–10.5)

## 2014-05-20 LAB — BASIC METABOLIC PANEL WITH GFR
BUN: 16 mg/dL (ref 6–23)
CO2: 30 meq/L (ref 19–32)
Calcium: 11.2 mg/dL — ABNORMAL HIGH (ref 8.4–10.5)
Chloride: 106 meq/L (ref 96–112)
Creatinine, Ser: 1.1 mg/dL (ref 0.4–1.2)
GFR: 60.57 mL/min
Glucose, Bld: 65 mg/dL — ABNORMAL LOW (ref 70–99)
Potassium: 4 meq/L (ref 3.5–5.1)
Sodium: 142 meq/L (ref 135–145)

## 2014-05-20 LAB — HEPATIC FUNCTION PANEL
ALT: 16 U/L (ref 0–35)
AST: 25 U/L (ref 0–37)
Albumin: 3.7 g/dL (ref 3.5–5.2)
Alkaline Phosphatase: 52 U/L (ref 39–117)
BILIRUBIN TOTAL: 0.4 mg/dL (ref 0.2–1.2)
Bilirubin, Direct: 0.1 mg/dL (ref 0.0–0.3)
Total Protein: 6.3 g/dL (ref 6.0–8.3)

## 2014-05-20 LAB — TSH: TSH: 2.09 u[IU]/mL (ref 0.35–4.50)

## 2014-05-20 LAB — PHOSPHORUS: Phosphorus: 2.5 mg/dL (ref 2.3–4.6)

## 2014-05-20 NOTE — Patient Instructions (Signed)
Today we updated your med list in our EPIC system...    Continue your current medications the same...  Today we did your follow up blood work...    We will contact you w/ the results when available...   Call for any questions...  Let's plan a follow up visit in 3mo, sooner if needed for problems...   

## 2014-05-20 NOTE — Progress Notes (Signed)
Subjective:    Patient ID: Jessica [name], female    DOB: 1911-02-16, 80 y.o.   MRN: [id number]  HPI 80 y/o BF here for a follow up visit...  She has mult medical problems including:  AR & runny nose;  Asthmatic Bronchitis;  HBP;  Chronic VI & edema;  Hyperchol;  GERD & esoph stricture;  Divertics;  Hx Urinary incontinence & UTIs;  DJD/ Osteoporosis;  Semile Dementia;  Anxiety...  SEE PREV EPIC NOTES FOR EARLIER DATA >>  ~  October 27, 2012:  822moROV & post hosp check> [facility] by [facility] w/ weakness, poor appetite & intake, electrolyte abn (K=3.3, Ca=10.1, Phos=1.8) & prot/cal malnutrition;  She responded to IV therapy & was covered w/ Rocephin=>Cipro, Urine grew mult species;  [facility]- it is clear that pt & husb cannot care for self & family unable to provide adequately, but finances preclude NHP for pt & husb... She notes feeling better, strength better, PT helping...    HBP> on ASA81, Amlod2.5, Lisin5, Demadex20-1/2, Diamox250, & K20/d; BP=122/64 & she is feeling better, still w/ mult somatic complaints...    VI> she knows to elim sodium, elev legs, wear support hose, take the diuretics- we decided to incr Demadex back to 252md...    Chol> on diet alone; she has a very hi HDL ~150!!!    Hypercalcemia> assoc w/ low phos- r/o hyperparathyroidism- see below...    GI- GERD, Divertics> on Prilosec20, Miralax, Senakot-S - all as needed...    GU- saw DrTannenbaum for Urology 10/12- mult complaints, urodynamics done w/ sm hypersens bladder unstable w/ leakage, & large bladder divertic> he rec double voiding technique...    DJD, Osteopenia> off Ca supplements they sent her home from hosp w/ Phos-2Bid...    Senile Dementia> on Aricept10 & Alpraz0.25 as needed... We reviewed prob list, meds, xrays and labs> see below for updates >>   LABS 11/13:  Chems- ok w/ K=3.5 Ca=10.5 Phos=2.6 (2.3-4.6) Mg=2.1...Marland Kitchen     ~  December 03, 2012:  22m60moOV & Jessica [name]'s CC= swelling in feet; her wt= 114# up 1# on Demadex20, Diamox250, K10-3/d, and KPhos-Bid; reminded to elim sodium, elev legs, wear support hose...  She is under a lot of stress w/ [name], son helps but works...      We reviewed prob list, meds, xrays and labs> see below for updates >>  LABS 12/13:  Chems- ok w/ K=3.5 Creat=1.1 Ca=10.8 Phos=3.6 PTH=122 (c/w hyperparathyroidism)...  We decided to continue her same meds for now...  ~  January 08, 2013:  22mo66mo & Jessica [name] to insist on monthly ROVs- c/o pedal edema about the same as prev, wt stable 114# despite running out of diuretics & we discussed refilling her Demadex20 & Diamox250;  BP stable, O2 sats are 99% on RA, chest clear, etc...  She persists w/ bladder complaints and she is reminded about the double voiding technique per DrTannenbaum & rec for Urology f/u prn...  ~  March 23, 2013:  2-107mo 9mo& Jessica [name]/o ankle swelling which tends to go down overnight- we discussed stopping her Amlodipine & redoubling efforts at no salt, elevation, support hose, & Demadex;  also c/o fine rash ?generalized w/ pruritis & we discussed Rx w/ Lotrisone cream & Atarax 25mg 107m.. She is once again asking for yet another round of home PT therapy...  We reviewed the following medical problems during  today's office visit >>     HBP> on ASA81, Amlod2.5, Lisin5, Demadex20-1/2, Diamox250, & K10-3/d + KPhos-1Bid; BP=100/62 & she is feeling sl weak w/ mult somatic complaints; labs 4/14 show K=3.7, Phos=3.4, Cr=1.1; we decided to STOP the Amlodipine & work on sodium restriction.    VI, edema> she knows to elim sodium, elev legs, wear support hose, take Demadex 90m/d...    Chol> on diet alone; she has a very hi HDL ~150!!!    Hypercalcemia> assoc w/ low phos- c/w primary hyperparathyroidism- she is 80y/o & not felt to be an operative candidate; [facility] [place] [facility] [place] w/ improvement in serum Phos to 3.6; serum Ca = 11.2  now but PTH is not rising.    GI- GERD, Divertics> off prev Prilosec20, Miralax, Senakot-S; advised these all as needed...    GU> saw DrTannenbaum for Urology 10/12- mult complaints, urodynamics done w/ sm hypersens bladder unstable w/ leakage, & large bladder divertic> he rec double voiding technique...    DJD, Osteopenia> off Ca supplements they sent her home from hosp w/ Phos-2Bid...    Senile Dementia> on Aricept10 & Alpraz0.25 as needed... We reviewed prob list, meds, xrays and labs> see below for updates >>   LABS 4/14:  Chems- Ca=11.2 but PTH is sl lower than prev at 86;  Phos=3.4 on KPhos1tabBid; K= 3.7 on this & K10- 2AM+1PM;  LFTs= wnl and Alb= 4.0 on Ensure...  ~  May 20, 2013:  244moOV & add-on for swelling> [name]; ?if she recalls our prev conversation about salt, elevation, support hose; she is taking the Demadex2080mm & Diamox250m3mm along w/ her KCl & KPhos=> labs today show: K=3.6, BUN=17, Cr=1.0;  Exam shows 3+edema in feet & lower legs, chest clear, heart w/ Gr1/6 SEM w/o rubs or gallops apprec;  We reviewed the need to Elim salt, Elevate the legs, Wear support hose, & we decided to STOP Lisinopril (BP=110/68) and INCREASE the Demadex20 to 2tabsQam...  ROV in 4-6weeks recheck; hopefully she can keep her visiting nurses, home help, etc (she will not go willingly to a NH or AL facility)...     We reviewed prob list, meds, xrays and labs> see below for updates >>   ~  June 23, 2013:  28mo 49mo& Jessica [name] w/ her daugh & her husb CecilLaveda Abbet seen 12/11 & asked to f/u in 65mo);58most OV we increased her Demadex to 20-2Qam + her Diamox250/d along w/ her KCl & KPhos- she is improved having lost 3# down to 110# & less edema; BP is improved as well at 120/62;  Her CC today is some burning discomfort in the bottom of her feet esp when standing &walking; we discussed Neuropathy symptoms & she requests med rx trial- try Gabapentin 100->300mg/d64m We  reviewed prob list, meds, xrays and labs> see below for updates >>   Labs 7/14:  Chems- wnl x Ca=10.8, continue same meds...  ~  August 24, 2013:  33mo ROV728moulia says she was Dx w/ Scabies yest [place] we do not have notes; she says she got it from her husb who picked it up while in rehab; her CC is itching & hopefully the Kwell lotion will take care of all this; she states that her neuropathy sx are better on the Gabapentin100Tid; She will turn 80 y/o 106t month...  BP remains well controlled on diuretics  alone & measures 116/64 today... We reviewed prob list, meds, xrays and labs> see below for updates >>  OK 2014 FLU vaccine today...   ~  October 25, 2013:  55moROV & JAlegriahas turned 80y/o & she framed her letter from [place]; states she's doing fair- same old problems, pain in left shoulder & she wants more PT, but I offered Ortho eval for XRay & poss injection instead...     BP is controlled on ASA81, Demadex20-2Qam, Diamox250, & K10-3/d + KPhos-1Bid (off prev Amlod & Lisin); BP=122/70 & she denies angina pain, palpit, ch in SOB etc; K is sl low at 3.2 7 rec to incr KCl10 to 2Bid...    Serum Calcium id ok at 10.3- no progressive incr & we continue to monitor this parameter...    DJD is treated w/ OTC analgesics and rec to f/u w/ Ortho re: shoulder pain...    She has a senile dementia- on Aricept10 and Alpraz0.25 for prn use... We reviewed prob list, meds, xrays and labs> see below for updates >>   LABS 11/14:  Chems- ok x K=3.2, BUN=24, Cr=1.1;  CBC w/ Hg=11.8;  TSH=2.48...  ~  March 15, 2014:  428moOV & Janaia has the same chr complaints- generally stable, notes leaking bladder (prev eval DrTannenbaum), burning feet (saw Podiatry-DrRegal), occas indigestion... She notes that [facility] now she wants ENT referral to check her ears for lavage...     She notes that her breathing is fine- no recent resp exac, cough, sputum, ch in SOB,  etc...    BP= 106/64 on Demadex20Qam & Diamox250Qpm w/ K10-2Bid and KPhos1Bid; see labs below...    She ambulates w/ cane & walker, denies CP/ palpit/ ch in edema...     She notes some urinary incont & wears depends, no burning etc...    She remains on Aricept5Bid- she prefers it this way & won't change... We reviewed prob list, meds, xrays and labs> see below for updates >> she did not bring med list or med bottles & reminded to bring bottles to every OV...   LABS 1/15:  Chems- ok w/ K=3.5, Cr=1.2  ~  May 20, 2014:  34m34moV & Marquisha is stable, same chr complaints which she like to discuss Q34mo49mo    HBP> on ASA81, Demadex20-2Qam, Diamox250-at 4pm, & K10-2Bid + KPhos-1Bid; BP=100/60 & labs 6/15 show K=4.0, Phos=2.5, Cr=1.1; continue same meds for now...    VI, edema> she knows to elim sodium, elev legs, wear support hose, take Demadex & Diamox as directed...    Chol> on diet alone; she has a very hi HDL ~150 when measured in 2012...    Hypercalcemia> assoc w/ low phos- c/w primary hyperparathyroidism- she is 80y/o & not felt to be an operative candidate; we are following Ca, PO4, PTH levels...     GI- GERD, Divertics> off prev Prilosec20, Miralax, Senakot-S; advised these all as needed...    GU> saw DrTannenbaum for Urology 10/12- mult complaints, urodynamics done w/ sm hypersens bladder unstable w/ leakage, & large bladder divertic> he rec double voiding technique...    DJD, Osteopenia> off Ca supplements, on KPhos-2Bid, on MVI & encouraged to walk as much as poss...    Senile Dementia> on Aricept10 & Alpraz0.25 as needed... We reviewed prob list, meds, xrays and labs> see below for updates >>   LABS 6/15:  Chems- ok x Ca=11.2, PO4=2.5, PTH=108 c/w primary hyperpara;  CBC- Hg=12.3;  TSH=2.09...          Problem List:  Hx of ALLERGIES (ICD-995.3) - she uses OTC meds as needed, & we prev discussed Zyrtek 10mg Qam, Saline nasal mist, & Atrovent Nasal 0.03% Tid for drainage (one of her chronic  complaints)...  Hx of ASTHMATIC BRONCHITIS, ACUTE (ICD-466.0) - no recent exacerbations... ~  CXR 5/13 showed borderline heart size, kyphoscoliosis, no edema, and clear lungs... ~  CXR 10/13 showed normal heart size, clear lungs, kyphoscoliosis, osteopenia...  HYPERTENSION (ICD-401.9) >>  ~  controlled on ASA 81mg /d, NORVASC5mg -1/2tab, LISINOPRIL5 & DEMADEX 20mg /d & DIAMOX250mg /d... ~  4/12:  BP= 110/60 and she states that she is taking her meds regularly & tolerating them well... denies HA, visual changes, CP, palpit, change in dyspnea, etc... but notes weakness, intermittently dizzy & persist edema in her feet...  ~  6/12:  BP= 124/70 & she remains stable... ~  8/12:  BP= 110/ 66 & stable... ~  10/12:  BP= 114/62 & stable... ~  1/13:  BP= 110/64 & she remains stable... ~  4/13:  BP= 116/58 & she is reminded to adjust Lasix 1-2 Qam according to her edema... ~  5/13:  BP= 140/72 & she is c/o pedal edema; we decided to change to Demadex 20mg - 2Qam. ~  7/13:  BP= 110/60 & her edema is diminished, still 1+ in feet... Labs showed HCO3=39 & DIAMOX 250mg /d added. ~  8/13:  BP= 118/74 & edema is the same; ?what she's taking? & we decided to simplify regimen> Norvasc1/2, Lisin1/2, Demadex-1/d & Diamox-1/d... ~  9/13:  BP= 110/70 & she is clinically stable on these meds; add K10Bid for the K=3.4 today... ~  EKG 10/13 showed NSR, rate63, RBBB, poss old infarct anterolaterally... ~  11/13:  on ASA81, Amlod2.5, Lisin5, Demadex20-1/2, Diamox250, & K20/d; BP=122/64 & she is feeling better, still w/ mult somatic complaints... ~  12/13:  BP= 124/72 & her edema, weight, etc are about stable... ~  1/14:  BP= 118/60 & she persists w/ mult somatic complaints... ~  4/14:  on ASA81, Amlod2.5, Lisin5, Demadex20-1/2, Diamox250, & K10-3/d + KPhos-1Bid; BP=100/62 & she is feeling sl weak w/ mult somatic complaints; labs 4/14 show K=3.7, Phos=3.4, Cr=1.1; we decided to STOP the Amlodipine & work on sodium restriction ~   6/14: on ASA81, Lisiin5, Demadex20, Diamox250, & K10-3/d + KPhos-1Bid; BP= 110/68 & 3+edema persists; Labs- OK & we decided to STOP the Lisinopril & INCR the Demadex20-2Qam.... ~  7/14: on  ASA81, Demadex20-2/d, Diamox250, & K10-3/d + KPhos-1Bid; BP= 120/62 & she is improved w/ decr edema... ~  9/14:  BP remains well controlled on diuretics alone & measures 116/64 today.. ~  11/14: BP is controlled on ASA81, Demadex20-2Qam, Diamox250, & K10-3/d + KPhos-1Bid (off prev Amlod & Lisin); BP=122/70 & she denies angina pain, palpit, ch in SOB etc; K is sl low at 3.2 7 rec to incr KCl10 to 2Bid. ~  3/15: BP= 106/64 on Demadex20Qam & Diamox250Qpm w/ K10-2Bid and KPhos1Bid... ~  6/15:  on ASA81, Demadex20-2Qam, Diamox250-at 4pm, & K10-2Bid + KPhos-1Bid; BP=100/60 & labs 6/15 show K=4.0, Phos=2.5, Cr=1.1; continue same meds for now.  VENOUS INSUFFICIENCY, CHRONIC (ICD-459.81) - there is mild pedal edema bilat L>R... on low sodium diet, elevation, support hose- prev refused diuretic meds due to urinary symptoms, now on [facility] as well...  HYPERCHOLESTEROLEMIA (ICD-272.0) - prev on Lip10, but she stopped this on her own... she has a very high HDL!!! ~  FLP was 1/08  showed TChol 253, TG 125, HDL 121, LDL 93 ~  FLP 2/09 showed TChol 200, TG 99, HDL 136, LDL 44 ~  FLP 4/11 showed TChol 244, TG 143, HDL 149, LDL 59 ~  FLP 1/12 showed TChol 266, TG 102, HDL 151, LDL 83 >> BEST HDL I'VE EVER SEEN ~  It has been hard to get her back in for FASTING labs...  HYPERCALCEMIA >> routine labs w/ Calcium levels betw 10.2 & 11.7 over the last 32yrs... ~  Labs 7/13 showed Ca= 11.8 &  PTH= 81... For now avoid calc supplements etc... ~  Labs 9/13 showed Ca= 11.0 & she is reminded- no calcium, no VitD... ~  Labs 10/13 Hosp reviewed> Ca was up, Phos was low, K was low> all improved w/ Rx in hosp (they did not repeat PTH level). ~  4/14:   assoc w/ low phos- c/w primary hyperparathyroidism- she is 80y/o & not felt to be an  operative candidate; she was started on KPhos 10/13 in [facility] & disch on 2Bid w/ improvement in serum Phos to 3.6; serum Ca = 11.2 now but PTH is not rising.  ~  Labs 11/14 showed Ca=10.3 ~  Labs 1/15 showed Ca= 10.5 ~  6/15:  Labs showed  Ca=11.2, PO4=2.5, PTH=108 c/w primary hyperparathyroidism; we are following...  GERD (ICD-530.81) - last EGD was 9/05 by DrPerry showing GERD and esoph stricture- dilated... she stopped her Nexium but states that her swallowing is good & she uses PRILOSEC OTC as needed.  DIVERTICULOSIS OF COLON (ICD-562.10) - last colonoscopy was 11/00 showing divertics only... ~  8/12:  She notes some constipation & rec to take [facility] & SENAKOT-S per our usual protocol...  HX, URINARY INFECTION (ICD-V13.02) & INCONTINENCE symptoms... she has seen DrTannenbaum & staff on bladder exercises and she reported some improvement but has persistant symptoms & never followed up... ~  7/10:  offered f/u appt w/ Urology vs second opinion consult but she declines... ~  9/10:  wrote for trial Enablex 7.5mg /d >> no benefit she says. ~  12/10: saw DrNesi- tried sample med, sl improved, but "he turned me over to a lady for longer term treatments" she says... ~  4/12 & 6/12: states DrNesi hasn't been able to help her & she wants appt w/ DrTannenbaum==> seen w/ urodynamic eval revealing a sm hypersens bladder & a large bladder divertic; not a surg candidate, taught double voiding technique... ~  10/12: she had f/u DrTannenbaum & he reinforced prev w/u & need for double voiding technique... ~  10/13: In hosp Urine grew mult species, Rx w/ Roceph=> Cipro...  DEGENERATIVE JOINT DISEASE (ICD-715.90) - notes right shoulder symptoms, but managing OK w/ Mobic 7.5mg  & Tylenol; also right hip & low back pain...  she also c/o neuropathic "burning" in legs but not bad enough for additional meds she says... ~  8/14: we have a note from [facility] (7 page note reviewed)> pt c/o bilat foot & ankle  pain & burning w/ numbness & tingling; Dx w/ somatic dysfunction in lumbar region; subluxations seen on XRays; given an adjustment... ~  9/14: symptoms improved on Gabapentin 100mg  Tid she says...  OSTEOPOROSIS (ICD-733.00) - she was on Fosamax but had discontinued this on her own... we discussed the need for continued therapy and she was asked to restart Alendronate but she never did...  she takes Ca++, MVI, VitD==> rec to STOP the Calcium.  SENILE DEMENTIA (ICD-290.0) - she takes ASA 81mg /d... she tried Aricept in  the past but didn't notice any improvement therefore stopped it. ~  she & her husb still live on their own but are having numerous difficulties while still resisting any thoughts of assisted living etc> got concerned because she saw an impulse in her left antecubital fossa (tortuous brachial art), went to the ER & this got translated to palpitations & lead to full ER cardiac eval= neg... ~  6/11: MMSE showed 26/30 & she agreed to try DONEPEZIL 10mg /d... ~  6/12:  Still taking Aricept & appears stable... ~  7/13:  No change, same meds, clinically stable... ~  7/14:  She remains stable and will be 80 y/o in Oct... ~  3/15:  She remains on Aricept5Bid- she prefers it this way & won't change...  ANXIETY (ICD-300.00) - she uses Alpraz 0.25mg  as needed.  Hx of SHINGLES (ICD-053.9)   Past Surgical History  Procedure Laterality Date  . Vesicovaginal fistula closure w/ tah    . Tonsillectomy    . Abdominal hysterectomy      Outpatient Encounter Prescriptions as of 05/20/2014  Medication Sig  . acetaZOLAMIDE (DIAMOX) 250 MG tablet TAKE 1 TABLET (250 MG TOTAL) BY MOUTH DAILY AT 4 PM  . acetaZOLAMIDE (DIAMOX) 250 MG tablet TAKE 1 TABLET BY MOUTH DAILY AT 4 PM  . ALPRAZolam (XANAX) 0.25 MG tablet Take 0.125-0.25 mg by mouth 3 (three) times daily as needed. For nerves  . aspirin EC 81 MG tablet Take 81 mg by mouth daily.  . clotrimazole-betamethasone (LOTRISONE) cream Apply topically 2  (two) times daily.  Marland Kitchen donepezil (ARICEPT) 5 MG tablet TAKE 1 TABLET (5 MG TOTAL) BY MOUTH 2 (TWO) TIMES DAILY.  . feeding supplement (ENSURE COMPLETE) LIQD Take 237 mLs by mouth 2 (two) times daily between meals.  . gabapentin (NEURONTIN) 100 MG capsule TAKE ONE CAPSULE DAILY FOR 1 WEEK, 1 CAP TWICE DAILY FOR 1 WEEK, THEN 1 CAP 3 TIMES DAILY  . KLOR-CON 10 10 MEQ tablet TAKE 2 TABS IN AM AND 2 TABS IN PM  . Multiple Vitamin (MULTIVITAMIN) tablet Take 1 tablet by mouth daily.    Marland Kitchen PHOSPHA 250 NEUTRAL 155-852-130 MG tablet TAKE 1 TABLET BY MOUTH 2 (TWO) TIMES DAILY.  Marland Kitchen potassium chloride (K-DUR) 10 MEQ tablet Take 2 tabs in am and 2 tabs in pm  . torsemide (DEMADEX) 20 MG tablet TAKE 2 TABLETS BY MOUTH EVERY MORNING    No Known Allergies   Current Medications, Allergies, Past Medical History, Past Surgical History, Family History, and Social History were reviewed in [employer] record.    Review of Systems        See HPI - all other systems neg except as noted...      The patient complains of decreased hearing, dyspnea on exertion, peripheral edema, incontinence, muscle weakness, and difficulty walking.  The patient denies anorexia, fever, weight loss, weight gain, vision loss, hoarseness, chest pain, syncope, prolonged cough, headaches, hemoptysis, abdominal pain, melena, hematochezia, severe indigestion/heartburn, hematuria, suspicious skin lesions, transient blindness, depression, unusual weight change, abnormal bleeding, enlarged lymph nodes, and angioedema.     Objective:   Physical Exam      WD, WN, 80 y/o BF in NAD... GENERAL:  Alert, pleasant & cooperative; she is slow moving as noted... HEENT:  [place]/AT, EOM-full, EACs- some wax, TMs-wnl, NOSE-clear, THROAT-clear & wnl. NECK:  Supple w/ fairROM; no JVD; normal carotid impulses w/o bruits; no thyromegaly or nodules palpated; no lymphadenopathy. CHEST:  Clear to P & A;  without wheezes/ rales/ or rhonchi., noted  kyphosis... HEART:  Regular Rhythm; Gr1/6 SEM, without rubs or gallops heard... ABDOMEN:  Soft & nontender; normal bowel sounds; no organomegaly or masses detected. EXT:  mod arthritic changes; no varicose veins/ +venous insuffic/ 3+edema now deficits... DERM:  No lesions noted; no rash etc...  MMSE> 06/13/10> Score 26/30 (missed place, 2/3 recall after distraction, & copy figure)...   Assessment & Plan:    AR & ASTHMA>  Breathing is stable, at baseline, she has chr rhinorrhea complaints w/ numerous discussions regarding management of this problem....  HBP>  BP is stable on the diuretic rx alone...  Ven Insuffic/ Edema>>  We have had mult conversations at each OV regarding no salt, elevation, support hose; she is on Demadex, Diamox & K supplementation;  Serially she has lost edema fluid & improved on the diuretics...  CHOL>  She has the world's best HDL!!!  Electrolye Abn>  See 10/13 Hosp by [facility], it is apparent that her elev calcium is the result of HYPERPARATHYROIDISM but at age 36 we are reluctant to refer for surg unless progressive... We are following her serum Calcium level & non-progressive. Hypercalcemia>  Surely from Hyperpara & she has stopped the calcium supplements, & we are following the labs...  GU>  She saw DrTannenbaum w/ extensive investigation into her voiding symptoms & urodynamics as above; rec to use "double voiding" technique...  DJD/ Osteopenia>  Aware, she is stable on OTC meds, she has repeatedly refuse Bisphos meds for her bones...  Dementia>  She & husb [name] are still living independently & have been married 62 yrs.  Other medical problems as noted...   Patient's Medications  New Prescriptions   No medications on file  Previous Medications   ACETAZOLAMIDE (DIAMOX) 250 MG TABLET    TAKE 1 TABLET BY MOUTH DAILY AT 4 PM   ALPRAZOLAM (XANAX) 0.25 MG TABLET    Take 0.125-0.25 mg by mouth 3 (three) times daily as needed. For nerves   ASPIRIN EC 81 MG TABLET     Take 81 mg by mouth daily.   CLOTRIMAZOLE-BETAMETHASONE (LOTRISONE) CREAM    Apply topically 2 (two) times daily.   FEEDING SUPPLEMENT (ENSURE COMPLETE) LIQD    Take 237 mLs by mouth 2 (two) times daily between meals.   GABAPENTIN (NEURONTIN) 100 MG CAPSULE    TAKE ONE CAPSULE DAILY FOR 1 WEEK, 1 CAP TWICE DAILY FOR 1 WEEK, THEN 1 CAP 3 TIMES DAILY   MULTIPLE VITAMIN (MULTIVITAMIN) TABLET    Take 1 tablet by mouth daily.    Modified Medications   Modified Medication Previous Medication   DONEPEZIL (ARICEPT) 5 MG TABLET donepezil (ARICEPT) 5 MG tablet      TAKE 1 TABLET (5 MG TOTAL) BY MOUTH 2 (TWO) TIMES DAILY.    TAKE 1 TABLET (5 MG TOTAL) BY MOUTH 2 (TWO) TIMES DAILY.   PHOSPHORUS (PHOSPHA 250 NEUTRAL) 155-852-130 MG TABLET PHOSPHA 250 NEUTRAL 155-852-130 MG tablet      TAKE 1 TABLET BY MOUTH 2 (TWO) TIMES DAILY.    TAKE 1 TABLET BY MOUTH 2 (TWO) TIMES DAILY.   POTASSIUM CHLORIDE (KLOR-CON 10) 10 MEQ TABLET KLOR-CON 10 10 MEQ tablet      TAKE 2 TABS IN AM AND 2 TABS IN PM    TAKE 2 TABS IN AM AND 2 TABS IN PM   TORSEMIDE (DEMADEX) 20 MG TABLET torsemide (DEMADEX) 20 MG tablet      TAKE 2 TABLETS BY MOUTH EVERY MORNING  TAKE 2 TABLETS BY MOUTH EVERY MORNING  Discontinued Medications   ACETAZOLAMIDE (DIAMOX) 250 MG TABLET    TAKE 1 TABLET (250 MG TOTAL) BY MOUTH DAILY AT 4 PM   POTASSIUM CHLORIDE (K-DUR) 10 MEQ TABLET    Take 2 tabs in am and 2 tabs in pm

## 2014-05-23 LAB — PTH, INTACT AND CALCIUM
Calcium: 11 mg/dL — ABNORMAL HIGH (ref 8.4–10.5)
PTH: 108.3 pg/mL — ABNORMAL HIGH (ref 14.0–72.0)

## 2014-06-09 ENCOUNTER — Telehealth: Payer: Self-pay | Admitting: Pulmonary Disease

## 2014-06-09 NOTE — Telephone Encounter (Signed)
Refills have already been sent to the pharmacy-----called and spoke with pt and she is aware that she will need to call the pharmacy to have these meds refilled by the pharmacy.

## 2014-06-15 ENCOUNTER — Telehealth: Payer: Self-pay | Admitting: Pulmonary Disease

## 2014-06-15 MED ORDER — TORSEMIDE 20 MG PO TABS: ORAL_TABLET | ORAL | Status: DC

## 2014-06-15 MED ORDER — POTASSIUM CHLORIDE ER 10 MEQ PO TBCR: EXTENDED_RELEASE_TABLET | ORAL | Status: DC

## 2014-06-15 MED ORDER — DONEPEZIL HCL 5 MG PO TABS: ORAL_TABLET | ORAL | Status: DC

## 2014-06-15 MED ORDER — K PHOS MONO-SOD PHOS DI & MONO 155-852-130 MG PO TABS: ORAL_TABLET | ORAL | Status: DC

## 2014-06-15 NOTE — Telephone Encounter (Signed)
Called and spoke with pt and she stated that she would like the following  Medications filled for 90 day supply.    Potassium phopha aricept Torsemide.    These have been sent to the pharmacy for the 90 day supply and nothing further is needed.

## 2014-06-20 ENCOUNTER — Telehealth: Payer: Self-pay | Admitting: Pulmonary Disease

## 2014-06-20 NOTE — Telephone Encounter (Signed)
Called and spoke with pt and she is aware that she is to take the potassium.  Nothing further is needed

## 2014-07-08 ENCOUNTER — Telehealth: Payer: Self-pay | Admitting: Pulmonary Disease

## 2014-07-08 NOTE — Telephone Encounter (Signed)
Called spoke with pt. Advised her donezapril was last refilled 06/15/14 and diamox in April x 6 refills. She will call her pharmacy. Nothing further needed

## 2014-07-20 ENCOUNTER — Encounter: Payer: Self-pay | Admitting: Pulmonary Disease

## 2014-07-20 ENCOUNTER — Other Ambulatory Visit (INDEPENDENT_AMBULATORY_CARE_PROVIDER_SITE_OTHER): Payer: Medicare Other

## 2014-07-20 ENCOUNTER — Ambulatory Visit (INDEPENDENT_AMBULATORY_CARE_PROVIDER_SITE_OTHER): Payer: Medicare Other | Admitting: Pulmonary Disease

## 2014-07-20 VITALS — BP 120/62 | HR 52 | Temp 98.8°F | Ht 60.0 in | Wt 112.2 lb

## 2014-07-20 DIAGNOSIS — I10 Essential (primary) hypertension: Secondary | ICD-10-CM

## 2014-07-20 DIAGNOSIS — F411 Generalized anxiety disorder: Secondary | ICD-10-CM

## 2014-07-20 DIAGNOSIS — R269 Unspecified abnormalities of gait and mobility: Secondary | ICD-10-CM

## 2014-07-20 DIAGNOSIS — R5383 Other fatigue: Secondary | ICD-10-CM

## 2014-07-20 DIAGNOSIS — M1990 Unspecified osteoarthritis, unspecified site: Secondary | ICD-10-CM

## 2014-07-20 DIAGNOSIS — R32 Unspecified urinary incontinence: Secondary | ICD-10-CM

## 2014-07-20 DIAGNOSIS — F0390 Unspecified dementia without behavioral disturbance: Secondary | ICD-10-CM

## 2014-07-20 DIAGNOSIS — I872 Venous insufficiency (chronic) (peripheral): Secondary | ICD-10-CM

## 2014-07-20 DIAGNOSIS — M810 Age-related osteoporosis without current pathological fracture: Secondary | ICD-10-CM

## 2014-07-20 DIAGNOSIS — I4510 Unspecified right bundle-branch block: Secondary | ICD-10-CM

## 2014-07-20 DIAGNOSIS — R5381 Other malaise: Secondary | ICD-10-CM

## 2014-07-20 DIAGNOSIS — R609 Edema, unspecified: Secondary | ICD-10-CM

## 2014-07-20 DIAGNOSIS — G589 Mononeuropathy, unspecified: Secondary | ICD-10-CM

## 2014-07-20 DIAGNOSIS — R531 Weakness: Secondary | ICD-10-CM

## 2014-07-20 LAB — URINALYSIS
BILIRUBIN URINE: NEGATIVE
HGB URINE DIPSTICK: NEGATIVE
Ketones, ur: NEGATIVE
Leukocytes, UA: NEGATIVE
Nitrite: NEGATIVE
Specific Gravity, Urine: <=1.005 — AB (ref 1.000–1.030)
TOTAL PROTEIN, URINE-UPE24: NEGATIVE
Urine Glucose: NEGATIVE
Urobilinogen, UA: 0.2 (ref 0.0–1.0)
pH: 7 (ref 5.0–8.0)

## 2014-07-20 LAB — PHOSPHORUS: PHOSPHORUS: 2.9 mg/dL (ref 2.3–4.6)

## 2014-07-20 LAB — BASIC METABOLIC PANEL
BUN: 19 mg/dL (ref 6–23)
CALCIUM: 10.4 mg/dL (ref 8.4–10.5)
CO2: 27 meq/L (ref 19–32)
CREATININE: 1.2 mg/dL (ref 0.4–1.2)
Chloride: 104 mEq/L (ref 96–112)
GFR: 54.62 mL/min — ABNORMAL LOW (ref >60.00)
Glucose, Bld: 96 mg/dL (ref 70–99)
Potassium: 3.1 mEq/L — ABNORMAL LOW (ref 3.5–5.1)
Sodium: 142 mEq/L (ref 135–145)

## 2014-07-20 MED ORDER — AZELASTINE HCL 0.1 % NA SOLN: NASAL | Status: DC

## 2014-07-20 MED ORDER — OXYBUTYNIN CHLORIDE 5 MG PO TABS: ORAL_TABLET | ORAL | Status: DC

## 2014-07-20 NOTE — Patient Instructions (Signed)
Today we updated your med list in our EPIC system...    Continue your current medications the same...  Today we decided to add ASTELIN NASAL SPRAY - one to two sprays in each nostril up to twice daily as needed for the dripping/ draining nose...  We also decided to try Ditropan 5mg - for your bladder leakage...    Start with 1/2 tab twice daily to see if this dose helps your symptoms...  Today we did your follow up blood work and a URINE check...    We will contact you w/ the results when available...   Call for any questions...  Let's plan a follow up visit in 4mo, sooner if needed for problems.Marland KitchenMarland Kitchen

## 2014-07-20 NOTE — Progress Notes (Signed)
Subjective:    Patient ID: Jessica [name], female    DOB: 1911-02-16, 80 y.o.   MRN: [id number]  HPI 80 y/o BF here for a follow up visit...  She has mult medical problems including:  AR & runny nose;  Asthmatic Bronchitis;  HBP;  Chronic VI & edema;  Hyperchol;  GERD & esoph stricture;  Divertics;  Hx Urinary incontinence & UTIs;  DJD/ Osteoporosis;  Semile Dementia;  Anxiety...  SEE PREV EPIC NOTES FOR EARLIER DATA >>  ~  October 27, 2012:  822moROV & post hosp check> Jessica [name] w/ weakness, poor appetite & intake, electrolyte abn (K=3.3, Ca=10.1, Phos=1.8) & prot/cal malnutrition;  She responded to IV therapy & was covered w/ Rocephin=>Cipro, Urine grew mult species;  [facility]- it is clear that pt & husb cannot care for self & family unable to provide adequately, but finances preclude NHP for pt & husb... She notes feeling better, strength better, PT helping...    HBP> on ASA81, Amlod2.5, Lisin5, Demadex20-1/2, Diamox250, & K20/d; BP=122/64 & she is feeling better, still w/ mult somatic complaints...    VI> she knows to elim sodium, elev legs, wear support hose, take the diuretics- we decided to incr Demadex back to 252md...    Chol> on diet alone; she has a very hi HDL ~150!!!    Hypercalcemia> assoc w/ low phos- r/o hyperparathyroidism- see below...    GI- GERD, Divertics> on Prilosec20, Miralax, Senakot-S - all as needed...    GU- saw DrTannenbaum for Urology 10/12- mult complaints, urodynamics done w/ sm hypersens bladder unstable w/ leakage, & large bladder divertic> he rec double voiding technique...    DJD, Osteopenia> off Ca supplements they sent her home from hosp w/ Phos-2Bid...    Senile Dementia> on Aricept10 & Alpraz0.25 as needed... We reviewed prob list, meds, xrays and labs> see below for updates >>   LABS 11/13:  Chems- ok w/ K=3.5 Ca=10.5 Phos=2.6 (2.3-4.6) Mg=2.1...Marland Kitchen     ~  December 03, 2012:  22m60moOV & Jessica [name]'s CC= swelling in feet; her wt= 114# up 1# on Demadex20, Diamox250, K10-3/d, and KPhos-Bid; reminded to elim sodium, elev legs, wear support hose...  She is under a lot of stress w/ Jessica [name], [name] helps but works...      We reviewed prob list, meds, xrays and labs> see below for updates >>  LABS 12/13:  Chems- ok w/ K=3.5 Creat=1.1 Ca=10.8 Phos=3.6 PTH=122 (c/w hyperparathyroidism)...  We decided to continue her same meds for now...  ~  January 08, 2013:  22mo66mo & Jessica [name] to insist on monthly ROVs- c/o pedal edema about the same as prev, wt stable 114# despite running out of diuretics & we discussed refilling her Demadex20 & Diamox250;  BP stable, O2 sats are 99% on RA, chest clear, etc...  She persists w/ bladder complaints and she is reminded about the double voiding technique per DrTannenbaum & rec for Urology f/u prn...  ~  March 23, 2013:  2-107mo 9mo& Jessica [name]/o ankle swelling which tends to go down overnight- we discussed stopping her Amlodipine & redoubling efforts at no salt, elevation, support hose, & Demadex;  also c/o fine rash ?generalized w/ pruritis & we discussed Rx w/ Lotrisone cream & Atarax 25mg 107m.. She is once again asking for yet another round of home PT therapy...  We reviewed the following medical problems during  today's office visit >>     HBP> on ASA81, Amlod2.5, Lisin5, Demadex20-1/2, Diamox250, & K10-3/d + KPhos-1Bid; BP=100/62 & she is feeling sl weak w/ mult somatic complaints; labs 4/14 show K=3.7, Phos=3.4, Cr=1.1; we decided to STOP the Amlodipine & work on sodium restriction.    VI, edema> she knows to elim sodium, elev legs, wear support hose, take Demadex 90m/d...    Chol> on diet alone; she has a very hi HDL ~150!!!    Hypercalcemia> assoc w/ low phos- c/w primary hyperparathyroidism- she is 80y/o & not felt to be an operative candidate; [facility] w/ improvement in serum Phos to 3.6; serum Ca = 11.2  now but PTH is not rising.    GI- GERD, Divertics> off prev Prilosec20, Miralax, Senakot-S; advised these all as needed...    GU> saw DrTannenbaum for Urology 10/12- mult complaints, urodynamics done w/ sm hypersens bladder unstable w/ leakage, & large bladder divertic> he rec double voiding technique...    DJD, Osteopenia> off Ca supplements they sent her home from hosp w/ Phos-2Bid...    Senile Dementia> on Aricept10 & Alpraz0.25 as needed... We reviewed prob list, meds, xrays and labs> see below for updates >>   LABS 4/14:  Chems- Ca=11.2 but PTH is sl lower than prev at 86;  Phos=3.4 on KPhos1tabBid; K= 3.7 on this & K10- 2AM+1PM;  LFTs= wnl and Alb= 4.0 on Ensure...  ~  May 20, 2013:  244moOV & add-on for swelling> Jessica [name] w/ c/o swelling in her legs; ?if she recalls our prev conversation about salt, elevation, support hose; she is taking the Demadex2080mm & Diamox250m3mm along w/ her KCl & KPhos=> labs today show: K=3.6, BUN=17, Cr=1.0;  Exam shows 3+edema in feet & lower legs, chest clear, heart w/ Gr1/6 SEM w/o rubs or gallops apprec;  We reviewed the need to Elim salt, Elevate the legs, Wear support hose, & we decided to STOP Lisinopril (BP=110/68) and INCREASE the Demadex20 to 2tabsQam...  ROV in 4-6weeks recheck; hopefully she can keep her visiting nurses, home help, etc (she will not go willingly to a NH or AL facility)...     We reviewed prob list, meds, xrays and labs> see below for updates >>   ~  June 23, 2013:  28mo 49mo& Jessica [name] & her husb Jessica [name] 12/11 & asked to f/u in 65mo);58most OV we increased her Demadex to 20-2Qam + her Diamox250/d along w/ her KCl & KPhos- she is improved having lost 3# down to 110# & less edema; BP is improved as well at 120/62;  Her CC today is some burning discomfort in the bottom of her feet esp when standing &walking; we discussed Neuropathy symptoms & she requests med rx trial- try Gabapentin 100->300mg/d64m We  reviewed prob list, meds, xrays and labs> see below for updates >>   Labs 7/14:  Chems- wnl x Ca=10.8, continue same meds...  ~  August 24, 2013:  33mo ROV728moulia says she was Dx w/ Scabies yest by [place] we do not have notes; she says she got it from her husb who picked it up while in rehab; her CC is itching & hopefully the Kwell lotion will take care of all this; she states that her neuropathy sx are better on the Gabapentin100Tid; She will turn 80 y/o 106t month...  BP remains well controlled on diuretics  alone & measures 116/64 today... We reviewed prob list, meds, xrays and labs> see below for updates >>  OK 2014 FLU vaccine today...   ~  October 25, 2013:  55moROV & JAlegriahas turned 80y/o & she framed her letter from [place]; states she's doing fair- same old problems, pain in left shoulder & she wants more PT, but I offered Ortho eval for XRay & poss injection instead...     BP is controlled on ASA81, Demadex20-2Qam, Diamox250, & K10-3/d + KPhos-1Bid (off prev Amlod & Lisin); BP=122/70 & she denies angina pain, palpit, ch in SOB etc; K is sl low at 3.2 7 rec to incr KCl10 to 2Bid...    Serum Calcium id ok at 10.3- no progressive incr & we continue to monitor this parameter...    DJD is treated w/ OTC analgesics and rec to f/u w/ Ortho re: shoulder pain...    She has a senile dementia- on Aricept10 and Alpraz0.25 for prn use... We reviewed prob list, meds, xrays and labs> see below for updates >>   LABS 11/14:  Chems- ok x K=3.2, BUN=24, Cr=1.1;  CBC w/ Hg=11.8;  TSH=2.48...  ~  March 15, 2014:  428moOV & Jessica [name] has the same chr complaints- generally stable, notes leaking bladder (prev eval DrTannenbaum), burning feet (saw Podiatry-DrRegal), occas indigestion... She notes that [facility] now she wants ENT referral to check her ears for lavage...     She notes that her breathing is fine- no recent resp exac, cough, sputum, ch in SOB,  etc...    BP= 106/64 on Demadex20Qam & Diamox250Qpm w/ K10-2Bid and KPhos1Bid; see labs below...    She ambulates w/ cane & walker, denies CP/ palpit/ ch in edema...     She notes some urinary incont & wears depends, no burning etc...    She remains on Aricept5Bid- she prefers it this way & won't change... We reviewed prob list, meds, xrays and labs> see below for updates >> she did not bring med list or med bottles & reminded to bring bottles to every OV...   LABS 1/15:  Chems- ok w/ K=3.5, Cr=1.2  ~  May 20, 2014:  34m34moV & Jessica [name] is stable, same chr complaints which she like to discuss Q34mo49mo    HBP> on ASA81, Demadex20-2Qam, Diamox250-at 4pm, & K10-2Bid + KPhos-1Bid; BP=100/60 & labs 6/15 show K=4.0, Phos=2.5, Cr=1.1; continue same meds for now...    VI, edema> she knows to elim sodium, elev legs, wear support hose, take Demadex & Diamox as directed...    Chol> on diet alone; she has a very hi HDL ~150 when measured in 2012...    Hypercalcemia> assoc w/ low phos- c/w primary hyperparathyroidism- she is 80y/o & not felt to be an operative candidate; we are following Ca, PO4, PTH levels...     GI- GERD, Divertics> off prev Prilosec20, Miralax, Senakot-S; advised these all as needed...    GU> saw DrTannenbaum for Urology 10/12- mult complaints, urodynamics done w/ sm hypersens bladder unstable w/ leakage, & large bladder divertic> he rec double voiding technique...    DJD, Osteopenia> off Ca supplements, on KPhos-2Bid, on MVI & encouraged to walk as much as poss...    Senile Dementia> on Aricept10 & Alpraz0.25 as needed... We reviewed prob list, meds, xrays and labs> see below for updates >>   LABS 6/15:  Chems- ok x Ca=11.2, PO4=2.5, PTH=108 c/w primary hyperpara;  CBC- Hg=12.3;  TSH=2.09...   ~  July 20, 2014:  53moROV & JMaziyahremains stable overall- states that she has been doing fairly well but has a list of questions and wants me to re-order visiting nurses to check on her & husb  every week or so (I explained [facility] & see what she is eligible for under Medicare)...     1) she wants a nasal spray for drippy nose> try ASTELIN 1-2 sp in each nostril Bid as needed...    2) she is c/o burning discomfort in legs & we reviewed her Neuropathy Dx & the reason for the Gabapentin Rx- she is only taking 1070mBid & gets some benefit from this she says; offered to incr dose but she can't remember to take it Tid 7 dosen't want to incr to 2Bid so we decided to keep it the same for now...    3) she is c/o urinary symptoms- leaking, some incont, denies burning/ odor/ pain/ blood etc; we discussed checking UA & C&S; we reviewed prev evals from DrNesi & DrTannenbaum- offered ROV w/ these providers but she declined; finally agreed to try Ditropan5m9m1/2tab bid for her symptoms and let me know if she is better...    4) she wants f/u labs and is due for BMet & Phos level (f/u hyperpara on observation)...  We reviewed prob list, meds, xrays and labs> see below for updates >> she brought med bottles and we reviewed her Rx today...  LABS 8/15:  Chems- ok x K=3.1 and Cr=1.2;  Phos=2.9;  UA= clear...          Problem List:  Hx of ALLERGIES (ICD-995.3) - she uses OTC meds as needed, & we prev discussed Zyrtek 45m18m, Saline nasal mist, & Atrovent Nasal 0.03% Tid for drainage (one of her chronic complaints)... ~  8/15: we tries Astelin nasal spray for her chr complaints of dripping...  Hx of ASTHMATIC BRONCHITIS, ACUTE (ICD-466.0) - no recent exacerbations... ~  CXR 5/13 showed borderline heart size, kyphoscoliosis, no edema, and clear lungs... ~  CXR 10/13 showed normal heart size, clear lungs, kyphoscoliosis, osteopenia...  HYPERTENSION (ICD-401.9) >>  ~  controlled on ASA 81mg95mNORVASC5mg-149mab, LISINOPRIL5 & DEMADEX 20mg/d70mIAMOX250mg/d.55m  4/12:  BP= 110/60 and she states that she is taking her meds regularly & tolerating them well... denies  HA, visual changes, CP, palpit, change in dyspnea, etc... but notes weakness, intermittently dizzy & persist edema in her feet...  ~  6/12:  BP= 124/70 & she remains stable... ~  8/12:  BP= 110/ 66 & stable... ~  10/12:  BP= 114/62 & stable... ~  1/13:  BP= 110/64 & she remains stable... ~  4/13:  BP= 116/58 & she is reminded to adjust Lasix 1-2 Qam according to her edema... ~  5/13:  BP= 140/72 & she is c/o pedal edema; we decided to change to Demadex 20mg- 2Q54m~  7/13:  BP= 110/60 & her edema is diminished, still 1+ in feet... Labs showed HCO3=39 & DIAMOX 250mg/d ad80m ~  8/13:  BP= 118/74 & edema is the same; ?what she's taking? & we decided to simplify regimen> Norvasc1/2, Lisin1/2, Demadex-1/d & Diamox-1/d... ~  9/13:  BP= 110/70 & she is clinically stable on these meds; add K10Bid for the K=3.4 today... ~  EKG 10/13 showed NSR, rate63, RBBB, poss old infarct anterolaterally... ~  11/13:  on ASA81, Amlod2.5, Lisin5, Demadex20-1/2, Diamox250, &  K20/d; BP=122/64 & she is feeling better, still w/ mult somatic complaints... ~  12/13:  BP= 124/72 & her edema, weight, etc are about stable... ~  1/14:  BP= 118/60 & she persists w/ mult somatic complaints... ~  4/14:  on ASA81, Amlod2.5, Lisin5, Demadex20-1/2, Diamox250, & K10-3/d + KPhos-1Bid; BP=100/62 & she is feeling sl weak w/ mult somatic complaints; labs 4/14 show K=3.7, Phos=3.4, Cr=1.1; we decided to STOP the Amlodipine & work on sodium restriction ~  6/14: on ASA81, Lisiin5, Demadex20, Diamox250, & K10-3/d + KPhos-1Bid; BP= 110/68 & 3+edema persists; Labs- OK & we decided to STOP the Lisinopril & INCR the Demadex20-2Qam.... ~  7/14: on  ASA81, Demadex20-2/d, Diamox250, & K10-3/d + KPhos-1Bid; BP= 120/62 & she is improved w/ decr edema... ~  9/14:  BP remains well controlled on diuretics alone & measures 116/64 today.. ~  11/14: BP is controlled on ASA81, Demadex20-2Qam, Diamox250, & K10-3/d + KPhos-1Bid (off prev Amlod & Lisin); BP=122/70  & she denies angina pain, palpit, ch in SOB etc; K is sl low at 3.2 7 rec to incr KCl10 to 2Bid. ~  3/15: BP= 106/64 on Demadex20Qam & Diamox250Qpm w/ K10-2Bid and KPhos1Bid... ~  6/15:  on ASA81, Demadex20-2Qam, Diamox250-at 4pm, & K10-2Bid + KPhos-1Bid; BP=100/60 & labs 6/15 show K=4.0, Phos=2.5, Cr=1.1; continue same meds for now.  VENOUS INSUFFICIENCY, CHRONIC (ICD-459.81) - there is mild pedal edema bilat L>R... on low sodium diet, elevation, support hose- prev refused diuretic meds due to urinary symptoms, now on [facility] as well...  HYPERCHOLESTEROLEMIA (ICD-272.0) - prev on Lip10, but she stopped this on her own... she has a very high HDL!!! ~  FLP was 1/08 showed TChol 253, TG 125, HDL 121, LDL 93 ~  FLP 2/09 showed TChol 200, TG 99, HDL 136, LDL 44 ~  FLP 4/11 showed TChol 244, TG 143, HDL 149, LDL 59 ~  FLP 1/12 showed TChol 266, TG 102, HDL 151, LDL 83 >> BEST HDL I'VE EVER [name] ~  It has been hard to get her back in for FASTING labs...  HYPERCALCEMIA >> routine labs w/ Calcium levels betw 10.2 & 11.7 over the last 1yr... ~  Labs 7/13 showed Ca= 11.8 &  PTH= 81... For now avoid calc supplements etc... ~  Labs 9/13 showed Ca= 11.0 & she is reminded- no calcium, no VitD... ~  Labs 10/13 Hosp reviewed> Ca was up, Phos was low, K was low> all improved w/ Rx in hosp (they did not repeat PTH level). ~  4/14:   assoc w/ low phos- c/w primary hyperparathyroidism- she is 80y/o & not felt to be an operative candidate; she [facility]os [facility] w/ improvement in serum Phos to 3.6; serum Ca = 11.2 now but PTH is not rising.  ~  Labs 11/14 showed Ca=10.3 ~  Labs 1/15 showed Ca= 10.5 ~  6/15:  Labs showed  Ca=11.2, PO4=2.5, PTH=108 c/w primary hyperparathyroidism; we are following... ~  Labs 8/15 improved w/ Ca= 10.4, Phos= 2.9  GERD (ICD-530.81) - last EGD was 9/05 by DrPerry showing GERD and esoph stricture- dilated... she stopped her Nexium but states that her  swallowing is good & she uses PRILOSEC OTC as needed.  DIVERTICULOSIS OF COLON (ICD-562.10) - last colonoscopy was 11/00 showing divertics only... ~  8/12:  She notes some constipation & rec to take MHeilper our usual protocol...  HX, URINARY INFECTION (ICD-V13.02) & INCONTINENCE symptoms... she has  [name] DrTannenbaum & staff on bladder exercises and she reported some improvement but has persistant symptoms & never followed up... ~  7/10:  offered f/u appt w/ Urology vs second opinion consult but she declines... ~  9/10:  wrote for trial Enablex 7.84m/d >> no benefit she says. ~  12/10: saw DrNesi- tried sample med, sl improved, but "he turned me over to a lady for longer term treatments" she says... ~  4/12 & 6/12: states DrNesi hasn't been able to help her & she wants appt w/ DrTannenbaum==> [name] w/ urodynamic eval revealing a sm hypersens bladder & a large bladder divertic; not a surg candidate, taught double voiding technique... ~  10/12: she had f/u DrTannenbaum & he reinforced prev w/u & need for double voiding technique... ~  10/13: In hosp Urine grew mult species, Rx w/ Roceph=> Cipro...  DEGENERATIVE JOINT DISEASE (ICD-715.90) - notes right shoulder symptoms, but managing OK w/ Mobic 7.577m& Tylenol; also right hip & low back pain...  she also c/o neuropathic "burning" in legs but not bad enough for additional meds she says... ~  8/14: we have a note from WiDeming7 page note reviewed)> pt c/o bilat foot & ankle pain & burning w/ numbness & tingling; Dx w/ somatic dysfunction in lumbar region; subluxations [name] on XRays; given an adjustment... ~  9/14: symptoms improved on Gabapentin 10069mid she says...  OSTEOPOROSIS (ICD-733.00) - she was on Fosamax but had discontinued this on her own... we discussed the need for continued therapy and she was asked to restart Alendronate but she never did...  she takes Ca++, MVI, VitD==> rec to STOP the Calcium.  SENILE  DEMENTIA (ICD-290.0) - she takes ASA 4m40m.. she tried Aricept in the past but didn't notice any improvement therefore stopped it. ~  she & her husb still live on their own but are having numerous difficulties while still resisting any thoughts of assisted living etc> got concerned because she saw an impulse in her left antecubital fossa (tortuous brachial art), went to the ER & this got translated to palpitations & lead to full ER cardiac eval= neg... ~  6/11: MMSE showed 26/30 & she agreed to try DONEPEZIL 10mg72m. ~  6/12:  Still taking Aricept & appears stable... ~  7/13:  No change, same meds, clinically stable... ~  7/14:  She remains stable and will be 89 y83in Oct... ~  3/15:  She remains on Aricept5Bid- she prefers it this way & won't change...  ANXIETY (ICD-300.00) - she uses Alpraz 0.25mg 37meeded.  Hx of SHINGLES (ICD-053.9)   Past Surgical History  Procedure Laterality Date  . Vesicovaginal fistula closure w/ tah    . Tonsillectomy    . Abdominal hysterectomy      Outpatient Encounter Prescriptions as of 07/20/2014  Medication Sig  . acetaZOLAMIDE (DIAMOX) 250 MG tablet TAKE 1 TABLET BY MOUTH DAILY AT 4 PM  . ALPRAZolam (XANAX) 0.25 MG tablet Take 0.125-0.25 mg by mouth 3 (three) times daily as needed. For nerves  . aspirin EC 81 MG tablet Take 81 mg by mouth daily.  . clotrimazole-betamethasone (LOTRISONE) cream Apply topically 2 (two) times daily.  . doneMarland Kitchenezil (ARICEPT) 5 MG tablet TAKE 1 TABLET (5 MG TOTAL) BY MOUTH 2 (TWO) TIMES DAILY.  . feeding supplement (ENSURE COMPLETE) LIQD Take 237 mLs by mouth 2 (two) times daily between meals.  . gabapentin (NEURONTIN) 100 MG capsule TAKE ONE CAPSULE DAILY FOR 1 WEEK, 1 CAP TWICE DAILY  FOR 1 WEEK, THEN 1 CAP 3 TIMES DAILY  . Multiple Vitamin (MULTIVITAMIN) tablet Take 1 tablet by mouth daily.    . phosphorus (PHOSPHA 250 NEUTRAL) 155-852-130 MG tablet TAKE 1 TABLET BY MOUTH 2 (TWO) TIMES DAILY.  Marland Kitchen potassium chloride  (KLOR-CON 10) 10 MEQ tablet TAKE 2 TABS IN AM AND 2 TABS IN PM  . torsemide (DEMADEX) 20 MG tablet TAKE 2 TABLETS BY MOUTH EVERY MORNING  . azelastine (ASTELIN) 0.1 % nasal spray 1-2 sprays in each nostril  Two times daily as directed  . oxybutynin (DITROPAN) 5 MG tablet Start with 1/2 tablet two times daily    No Known Allergies   Current Medications, Allergies, Past Medical History, Past Surgical History, Family History, and Social History were reviewed in [employer] record.    Review of Systems        See HPI - all other systems neg except as noted...      The patient complains of decreased hearing, dyspnea on exertion, peripheral edema, incontinence, muscle weakness, and difficulty walking.  The patient denies anorexia, fever, weight loss, weight gain, vision loss, hoarseness, chest pain, syncope, prolonged cough, headaches, hemoptysis, abdominal pain, melena, hematochezia, severe indigestion/heartburn, hematuria, suspicious skin lesions, transient blindness, depression, unusual weight change, abnormal bleeding, enlarged lymph nodes, and angioedema.     Objective:   Physical Exam      WD, WN, 80 y/o BF in NAD... GENERAL:  Alert, pleasant & cooperative; she is slow moving as noted... HEENT:  [place]/AT, EOM-full, EACs- some wax, TMs-wnl, NOSE-clear, THROAT-clear & wnl. NECK:  Supple w/ fairROM; no JVD; normal carotid impulses w/o bruits; no thyromegaly or nodules palpated; no lymphadenopathy. CHEST:  Clear to P & A; without wheezes/ rales/ or rhonchi., noted kyphosis... HEART:  Regular Rhythm; Gr1/6 SEM, without rubs or gallops heard... ABDOMEN:  Soft & nontender; normal bowel sounds; no organomegaly or masses detected. EXT:  mod arthritic changes; no varicose veins/ +venous insuffic/ 3+edema now deficits... DERM:  No lesions noted; no rash etc...  MMSE> 06/13/10> Score 26/30 (missed place, 2/3 recall after distraction, & copy figure)...   Assessment & Plan:     AR & ASTHMA>  Breathing is stable, at baseline, she has chr rhinorrhea complaints w/ numerous discussions regarding management of this problem....  HBP>  BP is stable on the diuretic rx alone...  Ven Insuffic/ Edema>>  We have had mult conversations at each OV regarding no salt, elevation, support hose; she is on Demadex, Diamox & K supplementation;  Serially she has lost edema fluid & improved on the diuretics...  CHOL>  She has the world's best HDL!!!  Electrolye Abn>  See 10/13 Hosp by [name], it is apparent that her elev calcium is the result of HYPERPARATHYROIDISM but at age 34 we are reluctant to refer for surg unless progressive... We are following her serum Calcium level & non-progressive. Hypercalcemia>  Surely from Hyperpara & she has stopped the calcium supplements, & we are following the labs...  GU>  She saw DrTannenbaum w/ extensive investigation into her voiding symptoms & urodynamics as above; rec to use "double voiding" technique... 8/15> still complaining & declines ret to Urology; try Ditropan 29m- 1/2 tab Bid...  DJD/ Osteopenia>  Aware, she is stable on OTC meds, she has repeatedly refuse Bisphos meds for her bones...  Dementia>  She & husb CLaveda Abbeare still living independently & have been married 657yrs.  Other medical problems as noted...   Patient's Medications  New  Prescriptions   AZELASTINE (ASTELIN) 0.1 % NASAL SPRAY    1-2 sprays in each nostril  Two times daily as directed   OXYBUTYNIN (DITROPAN) 5 MG TABLET    Start with 1/2 tablet two times daily  Previous Medications   ACETAZOLAMIDE (DIAMOX) 250 MG TABLET    TAKE 1 TABLET BY MOUTH DAILY AT 4 PM   ALPRAZOLAM (XANAX) 0.25 MG TABLET    Take 0.125-0.25 mg by mouth 3 (three) times daily as needed. For nerves   ASPIRIN EC 81 MG TABLET    Take 81 mg by mouth daily.   CLOTRIMAZOLE-BETAMETHASONE (LOTRISONE) CREAM    Apply topically 2 (two) times daily.   DONEPEZIL (ARICEPT) 5 MG TABLET    TAKE 1 TABLET (5 MG  TOTAL) BY MOUTH 2 (TWO) TIMES DAILY.   FEEDING SUPPLEMENT (ENSURE COMPLETE) LIQD    Take 237 mLs by mouth 2 (two) times daily between meals.   GABAPENTIN (NEURONTIN) 100 MG CAPSULE    TAKE ONE CAPSULE DAILY FOR 1 WEEK, 1 CAP TWICE DAILY FOR 1 WEEK, THEN 1 CAP 3 TIMES DAILY   MULTIPLE VITAMIN (MULTIVITAMIN) TABLET    Take 1 tablet by mouth daily.     PHOSPHORUS (PHOSPHA 250 NEUTRAL) 155-852-130 MG TABLET    TAKE 1 TABLET BY MOUTH 2 (TWO) TIMES DAILY.   POTASSIUM CHLORIDE (KLOR-CON 10) 10 MEQ TABLET    TAKE 2 TABS IN AM AND 2 TABS IN PM   TORSEMIDE (DEMADEX) 20 MG TABLET    TAKE 2 TABLETS BY MOUTH EVERY MORNING  Modified Medications   No medications on file  Discontinued Medications   No medications on file

## 2014-07-21 LAB — URINE CULTURE: Colony Count: 5000

## 2014-08-08 ENCOUNTER — Ambulatory Visit (INDEPENDENT_AMBULATORY_CARE_PROVIDER_SITE_OTHER): Payer: Medicare Other | Admitting: Podiatry

## 2014-08-08 DIAGNOSIS — M79673 Pain in unspecified foot: Secondary | ICD-10-CM

## 2014-08-08 DIAGNOSIS — M79609 Pain in unspecified limb: Secondary | ICD-10-CM

## 2014-08-08 DIAGNOSIS — B351 Tinea unguium: Secondary | ICD-10-CM

## 2014-08-08 NOTE — Progress Notes (Signed)
   Subjective:    Patient ID: Jessica [name], female    DOB: 07/12/1911, 80 y.o.   MRN: [id number]  HPI Pt presents for nail debridement   Review of Systems     Objective:   Physical Exam        Assessment & Plan:

## 2014-08-09 NOTE — Progress Notes (Signed)
Subjective:     Patient ID: Jessica [name], female   DOB: [birth year], 80 y.o.   MRN: [id number]  HPI patient presents with nail disease and pain 1-5 both feet that she cannot cut   Review of Systems     Objective:   Physical Exam Neurovascular status unchanged with thick yellow brittle nailbeds 1-5 of both feet    Assessment:     Mycotic nail infection with pain 1-5 both feet    Plan:     Debride painful nailbeds 1-5 both feet with no bleeding noted

## 2014-08-29 ENCOUNTER — Encounter (HOSPITAL_COMMUNITY): Payer: Self-pay | Admitting: Emergency Medicine

## 2014-08-29 ENCOUNTER — Encounter (HOSPITAL_COMMUNITY)
Admission: EM | Disposition: A | Discharge status: Skilled Nursing Facility | Payer: Self-pay | Source: Home / Self Care | Attending: Cardiovascular Disease

## 2014-08-29 ENCOUNTER — Inpatient Hospital Stay (HOSPITAL_COMMUNITY)
Admission: EM | Admit: 2014-08-29 | Discharge: 2014-09-02 | DRG: 282 | Disposition: A | Discharge status: Skilled Nursing Facility | Payer: Medicare Other | Attending: Cardiovascular Disease | Admitting: Cardiovascular Disease

## 2014-08-29 ENCOUNTER — Emergency Department (HOSPITAL_COMMUNITY): Payer: Medicare Other

## 2014-08-29 DIAGNOSIS — M810 Age-related osteoporosis without current pathological fracture: Secondary | ICD-10-CM | POA: Diagnosis present

## 2014-08-29 DIAGNOSIS — I129 Hypertensive chronic kidney disease with stage 1 through stage 4 chronic kidney disease, or unspecified chronic kidney disease: Secondary | ICD-10-CM | POA: Diagnosis present

## 2014-08-29 DIAGNOSIS — R0602 Shortness of breath: Secondary | ICD-10-CM | POA: Diagnosis not present

## 2014-08-29 DIAGNOSIS — I213 ST elevation (STEMI) myocardial infarction of unspecified site: Secondary | ICD-10-CM

## 2014-08-29 DIAGNOSIS — E785 Hyperlipidemia, unspecified: Secondary | ICD-10-CM | POA: Diagnosis present

## 2014-08-29 DIAGNOSIS — R9431 Abnormal electrocardiogram [ECG] [EKG]: Secondary | ICD-10-CM

## 2014-08-29 DIAGNOSIS — R0989 Other specified symptoms and signs involving the circulatory and respiratory systems: Secondary | ICD-10-CM

## 2014-08-29 DIAGNOSIS — I5041 Acute combined systolic (congestive) and diastolic (congestive) heart failure: Secondary | ICD-10-CM

## 2014-08-29 DIAGNOSIS — Z87440 Personal history of urinary (tract) infections: Secondary | ICD-10-CM | POA: Diagnosis not present

## 2014-08-29 DIAGNOSIS — Z79899 Other long term (current) drug therapy: Secondary | ICD-10-CM

## 2014-08-29 DIAGNOSIS — I214 Non-ST elevation (NSTEMI) myocardial infarction: Secondary | ICD-10-CM

## 2014-08-29 DIAGNOSIS — K219 Gastro-esophageal reflux disease without esophagitis: Secondary | ICD-10-CM | POA: Diagnosis present

## 2014-08-29 DIAGNOSIS — R531 Weakness: Secondary | ICD-10-CM

## 2014-08-29 DIAGNOSIS — N183 Chronic kidney disease, stage 3 unspecified: Secondary | ICD-10-CM | POA: Diagnosis present

## 2014-08-29 DIAGNOSIS — I359 Nonrheumatic aortic valve disorder, unspecified: Secondary | ICD-10-CM | POA: Diagnosis present

## 2014-08-29 DIAGNOSIS — I2109 ST elevation (STEMI) myocardial infarction involving other coronary artery of anterior wall: Secondary | ICD-10-CM | POA: Diagnosis present

## 2014-08-29 DIAGNOSIS — I219 Acute myocardial infarction, unspecified: Secondary | ICD-10-CM | POA: Diagnosis not present

## 2014-08-29 DIAGNOSIS — I10 Essential (primary) hypertension: Secondary | ICD-10-CM | POA: Diagnosis present

## 2014-08-29 DIAGNOSIS — Z8619 Personal history of other infectious and parasitic diseases: Secondary | ICD-10-CM

## 2014-08-29 DIAGNOSIS — I498 Other specified cardiac arrhythmias: Secondary | ICD-10-CM | POA: Diagnosis not present

## 2014-08-29 DIAGNOSIS — I452 Bifascicular block: Secondary | ICD-10-CM

## 2014-08-29 DIAGNOSIS — Z66 Do not resuscitate: Secondary | ICD-10-CM | POA: Diagnosis present

## 2014-08-29 DIAGNOSIS — R0609 Other forms of dyspnea: Secondary | ICD-10-CM | POA: Diagnosis present

## 2014-08-29 DIAGNOSIS — I872 Venous insufficiency (chronic) (peripheral): Secondary | ICD-10-CM | POA: Diagnosis present

## 2014-08-29 DIAGNOSIS — M1990 Unspecified osteoarthritis, unspecified site: Secondary | ICD-10-CM | POA: Diagnosis present

## 2014-08-29 DIAGNOSIS — I4510 Unspecified right bundle-branch block: Secondary | ICD-10-CM | POA: Diagnosis present

## 2014-08-29 DIAGNOSIS — F0390 Unspecified dementia without behavioral disturbance: Secondary | ICD-10-CM | POA: Diagnosis present

## 2014-08-29 DIAGNOSIS — R0600 Dyspnea, unspecified: Secondary | ICD-10-CM | POA: Diagnosis present

## 2014-08-29 DIAGNOSIS — E7800 Pure hypercholesterolemia, unspecified: Secondary | ICD-10-CM | POA: Diagnosis present

## 2014-08-29 DIAGNOSIS — I255 Ischemic cardiomyopathy: Secondary | ICD-10-CM

## 2014-08-29 LAB — CBC
HCT: 42.8 % (ref 36.0–46.0)
Hemoglobin: 13.5 g/dL (ref 12.0–15.0)
MCH: 27.6 pg (ref 26.0–34.0)
MCHC: 31.5 g/dL (ref 30.0–36.0)
MCV: 87.3 fL (ref 78.0–100.0)
PLATELETS: 222 10*3/uL (ref 150–400)
RBC: 4.9 MIL/uL (ref 3.87–5.11)
RDW: 14 % (ref 11.5–15.5)
WBC: 8.2 10*3/uL (ref 4.0–10.5)

## 2014-08-29 LAB — COMPREHENSIVE METABOLIC PANEL
ALBUMIN: 4.1 g/dL (ref 3.5–5.2)
ALT: 22 U/L (ref 0–35)
AST: 50 U/L — AB (ref 0–37)
Alkaline Phosphatase: 70 U/L (ref 39–117)
Anion gap: 14 (ref 5–15)
BILIRUBIN TOTAL: 0.2 mg/dL — AB (ref 0.3–1.2)
BUN: 14 mg/dL (ref 6–23)
CHLORIDE: 100 meq/L (ref 96–112)
CO2: 26 meq/L (ref 19–32)
Calcium: 10.9 mg/dL — ABNORMAL HIGH (ref 8.4–10.5)
Creatinine, Ser: 1.09 mg/dL (ref 0.50–1.10)
GFR calc Af Amer: 47 mL/min — ABNORMAL LOW (ref >90)
GFR, EST NON AFRICAN AMERICAN: 40 mL/min — AB (ref >90)
Glucose, Bld: 113 mg/dL — ABNORMAL HIGH (ref 70–99)
POTASSIUM: 4.8 meq/L (ref 3.7–5.3)
Sodium: 140 mEq/L (ref 137–147)
Total Protein: 7.9 g/dL (ref 6.0–8.3)

## 2014-08-29 LAB — D-DIMER, QUANTITATIVE (NOT AT ARMC)

## 2014-08-29 LAB — I-STAT TROPONIN, ED
Troponin i, poc: 0.17 ng/mL (ref 0.00–0.08)
Troponin i, poc: 2.62 ng/mL (ref 0.00–0.08)

## 2014-08-29 LAB — APTT: aPTT: 20 seconds — ABNORMAL LOW (ref 24–37)

## 2014-08-29 LAB — PROTIME-INR
INR: 0.99 (ref 0.00–1.49)
PROTHROMBIN TIME: 13.1 s (ref 11.6–15.2)

## 2014-08-29 SURGERY — LEFT HEART CATHETERIZATION WITH CORONARY ANGIOGRAM
Anesthesia: LOCAL

## 2014-08-29 MED ORDER — HEPARIN SODIUM (PORCINE) 5000 UNIT/ML IJ SOLN: 60.0000 [IU]/kg | Freq: Once | INTRAMUSCULAR | Status: DC

## 2014-08-29 MED ORDER — ACETAMINOPHEN 325 MG PO TABS: 650.0000 mg | ORAL_TABLET | ORAL | Status: DC | PRN

## 2014-08-29 MED ORDER — ASPIRIN EC 81 MG PO TBEC
81.0000 mg | DELAYED_RELEASE_TABLET | Freq: Every day | ORAL | Status: DC
  Administered 2014-08-30 – 2014-09-02 (×4): 81 mg via ORAL
  Filled 2014-08-29 (×4): qty 1

## 2014-08-29 MED ORDER — NITROGLYCERIN 0.4 MG SL SUBL: 0.4000 mg | SUBLINGUAL_TABLET | SUBLINGUAL | Status: DC | PRN

## 2014-08-29 MED ORDER — HYDRALAZINE HCL 25 MG PO TABS
25.0000 mg | ORAL_TABLET | Freq: Three times a day (TID) | ORAL | Status: DC
  Administered 2014-08-29 – 2014-09-01 (×4): 25 mg via ORAL
  Filled 2014-08-29 (×11): qty 1

## 2014-08-29 MED ORDER — ONDANSETRON HCL 4 MG/2ML IJ SOLN: 4.0000 mg | Freq: Four times a day (QID) | INTRAMUSCULAR | Status: DC | PRN

## 2014-08-29 MED ORDER — METOPROLOL TARTRATE 12.5 MG HALF TABLET
12.5000 mg | ORAL_TABLET | Freq: Two times a day (BID) | ORAL | Status: DC
  Administered 2014-08-30: 12.5 mg via ORAL
  Filled 2014-08-29 (×5): qty 1

## 2014-08-29 MED ORDER — HEPARIN (PORCINE) IN NACL 100-0.45 UNIT/ML-% IJ SOLN
550.0000 [IU]/h | INTRAMUSCULAR | Status: DC
  Administered 2014-08-29: 600 [IU]/h via INTRAVENOUS
  Administered 2014-08-31: 550 [IU]/h via INTRAVENOUS
  Filled 2014-08-29 (×3): qty 250

## 2014-08-29 MED ORDER — OXYBUTYNIN CHLORIDE 5 MG PO TABS
2.5000 mg | ORAL_TABLET | Freq: Two times a day (BID) | ORAL | Status: DC
  Administered 2014-08-29 – 2014-08-30 (×2): 2.5 mg via ORAL
  Administered 2014-08-30: 11:00:00 via ORAL
  Administered 2014-08-31 – 2014-09-02 (×5): 2.5 mg via ORAL
  Filled 2014-08-29 (×9): qty 0.5

## 2014-08-29 MED ORDER — ENSURE COMPLETE PO LIQD
237.0000 mL | Freq: Two times a day (BID) | ORAL | Status: DC
  Administered 2014-08-30 – 2014-09-02 (×5): 237 mL via ORAL

## 2014-08-29 MED ORDER — HEPARIN BOLUS VIA INFUSION
2500.0000 [IU] | Freq: Once | INTRAVENOUS | Status: AC
  Administered 2014-08-29: 2500 [IU] via INTRAVENOUS
  Filled 2014-08-29: qty 2500

## 2014-08-29 MED ORDER — ASPIRIN 81 MG PO CHEW
324.0000 mg | CHEWABLE_TABLET | Freq: Once | ORAL | Status: AC
  Administered 2014-08-29: 324 mg via ORAL
  Filled 2014-08-29: qty 4

## 2014-08-29 MED ORDER — SODIUM CHLORIDE 0.9 % IV SOLN
INTRAVENOUS | Status: DC
  Administered 2014-08-29: 20:00:00 via INTRAVENOUS
  Administered 2014-08-30: 40 mL via INTRAVENOUS

## 2014-08-29 MED ORDER — ATORVASTATIN CALCIUM 10 MG PO TABS
10.0000 mg | ORAL_TABLET | Freq: Every day | ORAL | Status: DC
  Administered 2014-08-30 – 2014-08-31 (×2): 10 mg via ORAL
  Filled 2014-08-29 (×3): qty 1

## 2014-08-29 MED ORDER — K PHOS MONO-SOD PHOS DI & MONO 155-852-130 MG PO TABS
250.0000 mg | ORAL_TABLET | Freq: Two times a day (BID) | ORAL | Status: DC
  Administered 2014-08-29 – 2014-09-02 (×8): 250 mg via ORAL
  Filled 2014-08-29 (×9): qty 1

## 2014-08-29 MED ORDER — DONEPEZIL HCL 5 MG PO TABS
5.0000 mg | ORAL_TABLET | Freq: Two times a day (BID) | ORAL | Status: DC
  Administered 2014-08-29 – 2014-09-02 (×8): 5 mg via ORAL
  Filled 2014-08-29 (×9): qty 1

## 2014-08-29 NOTE — ED Notes (Signed)
Jessica [name], Res MD aware of abnormal lab test results

## 2014-08-29 NOTE — H&P (Signed)
Jessica [name] is an 80 y.o. female.   Chief Complaint: Shortness of breath HPI:   The patient is a 80 year old frail-appearing Afro-American female with a history of hypertension, toxemia, palpitations, hyperlipidemia, chronic lower extremity edema, GERD. Patient reports that she became much acutely short of breath after working in the closet at her home. She sat down to try and rest and the shortness of breath did not subside. Her son tried putting on air conditioning it did not help.  She reports chronic lower extremity edema.  She otherwise denies nausea, vomiting, fever, chest pain,  orthopnea, dizziness, PND, cough, congestion, abdominal pain, hematochezia, melena, claudication.   Past Medical History  Diagnosis Date  . Allergy, unspecified not elsewhere classified   . Hypertension   . Palpitations   . Chronic venous insufficiency   . Hypercholesteremia   . GERD (gastroesophageal reflux disease)   . Diverticulosis of colon   . History of urinary tract infection   . Urinary frequency   . Urinary incontinence   . DJD (degenerative joint disease)   . Osteoporosis   . Senile dementia   . Neuropathy   . Anxiety   . History of shingles     Past Surgical History  Procedure Laterality Date  . Vesicovaginal fistula closure w/ tah    . Tonsillectomy    . Abdominal hysterectomy      Family History  Problem Relation Age of Onset  . Transient ischemic attack Father   . Pneumonia Sister    Social History:  reports that she has never smoked. She has never used smokeless tobacco. She reports that she does not drink alcohol or use illicit drugs.  Allergies: No Known Allergies   (Not in a hospital admission)  Results for orders placed during the hospital encounter of 08/29/14 (from the past 48 hour(s))  APTT     Status: Abnormal   Collection Time    08/29/14  6:54 PM      Result Value Ref Range   aPTT 20 (*) 24 - 37 seconds  CBC     Status: None   Collection Time   08/29/14  6:54 PM      Result Value Ref Range   WBC 8.2  4.0 - 10.5 K/uL   RBC 4.90  3.87 - 5.11 MIL/uL   Hemoglobin 13.5  12.0 - 15.0 g/dL   HCT 42.8  36.0 - 46.0 %   MCV 87.3  78.0 - 100.0 fL   MCH 27.6  26.0 - 34.0 pg   MCHC 31.5  30.0 - 36.0 g/dL   RDW 14.0  11.5 - 15.5 %   Platelets 222  150 - 400 K/uL  COMPREHENSIVE METABOLIC PANEL     Status: Abnormal   Collection Time    08/29/14  6:54 PM      Result Value Ref Range   Sodium 140  137 - 147 mEq/L   Potassium 4.8  3.7 - 5.3 mEq/L   Comment: HEMOLYSIS AT THIS LEVEL MAY AFFECT RESULT   Chloride 100  96 - 112 mEq/L   CO2 26  19 - 32 mEq/L   Glucose, Bld 113 (*) 70 - 99 mg/dL   BUN 14  6 - 23 mg/dL   Creatinine, Ser 1.09  0.50 - 1.10 mg/dL   Calcium 10.9 (*) 8.4 - 10.5 mg/dL   Total Protein 7.9  6.0 - 8.3 g/dL   Albumin 4.1  3.5 - 5.2 g/dL   AST 50 (*)  0 - 37 U/L   Comment: HEMOLYSIS AT THIS LEVEL MAY AFFECT RESULT   ALT 22  0 - 35 U/L   Comment: HEMOLYSIS AT THIS LEVEL MAY AFFECT RESULT   Alkaline Phosphatase 70  39 - 117 U/L   Total Bilirubin 0.2 (*) 0.3 - 1.2 mg/dL   GFR calc non Af Amer 40 (*) >90 mL/min   GFR calc Af Amer 47 (*) >90 mL/min   Comment: (NOTE)     The eGFR has been calculated using the CKD EPI equation.     This calculation has not been validated in all clinical situations.     eGFR's persistently <90 mL/min signify possible Chronic Kidney     Disease.   Anion gap 14  5 - 15  PROTIME-INR     Status: None   Collection Time    08/29/14  6:54 PM      Result Value Ref Range   Prothrombin Time 13.1  11.6 - 15.2 seconds   INR 0.99  0.00 - 1.49  I-STAT TROPOININ, ED     Status: Abnormal   Collection Time    08/29/14  7:01 PM      Result Value Ref Range   Troponin i, poc 0.17 (*) 0.00 - 0.08 ng/mL   Comment NOTIFIED PHYSICIAN     Comment 3            Comment: Due to the release kinetics of cTnI,     a negative result within the first hours     of the onset of symptoms does not rule out      myocardial infarction with certainty.     If myocardial infarction is still suspected,     repeat the test at appropriate intervals.   Dg Chest Port 1 View  08/29/2014   CLINICAL DATA:  Shortness of breath.  EXAM: PORTABLE CHEST - 1 VIEW  COMPARISON:  10/10/2012.  FINDINGS: Poor inspiration. Grossly stable mild enlargement of the cardiac silhouette. Clear lungs. Diffuse osteopenia. Stable scoliosis.  IMPRESSION: No acute abnormality.   Electronically Signed   By: Enrique Sack M.D.   On: 08/29/2014 19:43    Review of Systems  Constitutional: Negative for diaphoresis.  HENT: Negative for congestion.   Respiratory: Positive for shortness of breath. Negative for cough.   Cardiovascular: Negative for chest pain, orthopnea, leg swelling and PND.  Gastrointestinal: Negative for nausea, vomiting, abdominal pain and blood in stool.  Genitourinary: Negative for hematuria.  Neurological: Negative for dizziness and weakness.  All other systems reviewed and are negative.   Blood pressure 166/80, pulse 59, temperature 97.7 F (36.5 C), temperature source Oral, resp. rate 27, height 5' (1.524 m), weight 112 lb 3.4 oz (50.9 kg), SpO2 100.00%. Physical Exam  Nursing note and vitals reviewed. Constitutional: She is oriented to person, place, and time. She appears well-developed. No distress.  Thin appearing  HENT:  Head: Normocephalic and atraumatic.  Mouth/Throat: No oropharyngeal exudate.  Eyes: EOM are normal. Pupils are equal, round, and reactive to light. No scleral icterus.  Neck: Normal range of motion. Neck supple. No JVD present.  Cardiovascular: Normal rate, regular rhythm, S1 normal and S2 normal.   No murmur heard. Pulses:      Carotid pulses are on the left side with bruit.      Radial pulses are 2+ on the right side, and 2+ on the left side.       Dorsalis pedis pulses are 1+ on the right side,  and 1+ on the left side.  Respiratory: She has no wheezes. She has no rales.  Decreased  breath sounds in the left which may be from poor effort  GI: Soft. Bowel sounds are normal. She exhibits no distension. There is no tenderness.  Musculoskeletal: She exhibits no edema.  Lymphadenopathy:    She has no cervical adenopathy.  Neurological: She is alert and oriented to person, place, and time. She exhibits normal muscle tone.  Skin: Skin is warm and dry.  Psychiatric: She has a normal mood and affect.     Assessment/Plan Principal Problem:   Dyspnea Active Problems:   HYPERCHOLESTEROLEMIA   HYPERTENSION   Hypercalcemia   Chronic kidney disease (CKD), stage III (moderate)     Plan: 80 year old female presenting with acute dyspnea. O2 saturations are 98 100% on 2 L nasal cannula Patient be admitted to the step down unit. We'll continue to monitor heart rate and oxygen levels. We will check a d-dimer and order 2-D echocardiogram. Continue check troponin which initial POC was 0.17.   Continue IV heparin.  Low dose beta blocker. Start hydralazine 25 mg 3 times a day.  Chest x-ray shows no acute abnormality however, coronary arteries appear to be severely calcified.  Tarri Fuller, [place] 08/29/2014, 7:59 PM    Agree with note written by Luisa Dago [facility]  Pt admitted with SOB and abn EKG (unchanged c/w prior EKGs). No CP. Exam benign. Discussed with pt and son. Given her age she does not want aggressive/invasive RX and wishes to be a DNR which I agree with. Will admit, cycle enzymes and Rx medically.   Naydeen Speirs J 08/30/2014 1:10 AM

## 2014-08-29 NOTE — Progress Notes (Signed)
ANTICOAGULATION CONSULT NOTE - Initial Consult  Pharmacy Consult for Heparin Indication: ACS  No Known Allergies  Patient Measurements: Height: 5' (152.4 cm) Weight: 112 lb 3.4 oz (50.9 kg) IBW/kg (Calculated) : 45.5 Heparin Dosing Weight: 50.9 kg  Vital Signs: Temp: 97.7 F (36.5 C) (09/14 1852) Temp src: Oral (09/14 1852) BP: 162/75 mmHg (09/14 1900) Pulse Rate: 65 (09/14 1900)  Labs:  Recent Labs  08/29/14 1854  HGB 13.5  HCT 42.8  PLT 222  APTT 20*  LABPROT 13.1  INR 0.99    Estimated Creatinine Clearance: 17.9 ml/min (by C-G formula based on Cr of 1.2).   Medical History: Past Medical History  Diagnosis Date  . Allergy, unspecified not elsewhere classified   . Hypertension   . Palpitations   . Chronic venous insufficiency   . Hypercholesteremia   . GERD (gastroesophageal reflux disease)   . Diverticulosis of colon   . History of urinary tract infection   . Urinary frequency   . Urinary incontinence   . DJD (degenerative joint disease)   . Osteoporosis   . Senile dementia   . Neuropathy   . Anxiety   . History of shingles     Medications:  See electronic med rec  Assessment: 80 y.o. female presents with SOB. Pt noted to have EKG changes. Found to have elevated troponin. To begin heparin for ACS. CBC ok at baseline.   Goal of Therapy:  Heparin level 0.3-0.7 units/ml Monitor platelets by anticoagulation protocol: Yes   Plan:  1. Heparin IV bolus 2500 units 2. Heparin gtt at 600 units/hr 3. Will f/u 8 hr heparin level 4. Daily heparin level and CBC  Christoper Fabian, PharmD, BCPS Clinical pharmacist, pager [phone] 08/29/2014,7:29 PM

## 2014-08-29 NOTE — ED Notes (Signed)
Pt having SOB for the past 2 hours. sts some heaviness in her chest with the SOB.

## 2014-08-29 NOTE — ED Provider Notes (Signed)
I saw and evaluated the patient, reviewed the resident's note and I agree with the findings and plan.  Please see my separate note regarding my evaluation of the patient.  Clinical Impression:    Final diagnoses:  ST elevation myocardial infarction (STEMI), unspecified artery     Vida Roller, MD 08/29/14 2352

## 2014-08-29 NOTE — ED Provider Notes (Signed)
Pt with hx of   Past Medical History  Diagnosis Date  . Allergy, unspecified not elsewhere classified   . Hypertension   . Palpitations   . Chronic venous insufficiency   . Hypercholesteremia   . GERD (gastroesophageal reflux disease)   . Diverticulosis of colon   . History of urinary tract infection   . Urinary frequency   . Urinary incontinence   . DJD (degenerative joint disease)   . Osteoporosis   . Senile dementia   . Neuropathy   . Anxiety   . History of shingles    80 year old female, history of hypertension and hypercholesterolemia who presents with acute onset of shortness of breath. On exam the patient has clear heart and lung sounds, bilateral lower extremity peripheral edema which is symmetrical and pink, no murmurs rubs or gallops. Her EKG shows acute ischemia with elevation in leads 1 and aVL, abnormal ST segments in the inferior leads as well as lead V2. She does have a right bundle branch block and has a history of a right bundle branch block. Will discuss with cardiology, patient states that she would be amenable to a heart catheterization, heparin ordered.  Heparin bolus and drip ordered, troponin elevated  Cardiology to admit, consider cath   I saw and evaluated the patient, reviewed the resident's note and I agree with the findings and plan.  D/w Dr. Allyson Sabal - will have Dr. Delton See of Cardiology come to see pt - agrees high lateral STEMI likely.  CRITICAL CARE Performed by: Vida Roller Total critical care time: 35 Critical care time was exclusive of separately billable procedures and treating other patients. Critical care was necessary to treat or prevent imminent or life-threatening deterioration. Critical care was time spent personally by me on the following activities: development of treatment plan with patient and/or surrogate as well as nursing, discussions with consultants, evaluation of patient's response to treatment, examination of patient, obtaining  history from patient or surrogate, ordering and performing treatments and interventions, ordering and review of laboratory studies, ordering and review of radiographic studies, pulse oximetry and re-evaluation of patient's condition.   I personally interpreted the EKG as well as the resident and agree with the interpretation on the resident's chart.   EKG Interpretation  Date/Time:  Monday August 29 2014 18:47:09 EDT Ventricular Rate:  67 PR Interval:  226 QRS Duration: 148 QT Interval:  461 QTC Calculation: 487 R Axis:   -56 Text Interpretation:  Sinus rhythm Prolonged PR interval Probable left atrial enlargement RBBB and LAFB ST elevation in 1 and L, V2, ST abnormalities in inferior leads c/w possible borderline  STEMI Confirmed by Hyacinth Meeker  MD, [place] Subia ([id number]) on 08/29/2014 6:54:16 PM           Vida Roller, MD 08/29/14 2352

## 2014-08-29 NOTE — ED Provider Notes (Signed)
CSN: [id number]     Arrival date & time 08/29/14  1843 History   First MD Initiated Contact with Patient 08/29/14 1847     Chief Complaint  Patient presents with  . Shortness of Breath  . Code STEMI    Patient is a 80 y.o. female presenting with shortness of breath. The history is provided by the patient and the EMS personnel.  Shortness of Breath Severity:  Moderate Onset quality:  Sudden Duration:  2 hours Timing:  Constant Progression:  Unchanged Chronicity:  New Context: activity (cleaning clsoet)   Context: not URI   Relieved by:  Nothing Worsened by:  Nothing tried Ineffective treatments:  None tried Associated symptoms: chest pain (when she was exerting slef it was substernal and aching, resolved) and diaphoresis (felt warm but not sweating)   Associated symptoms: no abdominal pain, no cough, no fever, no hemoptysis, no sputum production, no syncope, no vomiting and no wheezing   Risk factors: no hx of cancer, no hx of PE/DVT, no recent surgery and no tobacco use    No hx of cardiac disease.  EMS in coded with concerning EKG with RBBB with concordant changes. CODE STEMI called on arrival.   Past Medical History  Diagnosis Date  . Allergy, unspecified not elsewhere classified   . Hypertension   . Palpitations   . Chronic venous insufficiency   . Hypercholesteremia   . GERD (gastroesophageal reflux disease)   . Diverticulosis of colon   . History of urinary tract infection   . Urinary frequency   . Urinary incontinence   . DJD (degenerative joint disease)   . Osteoporosis   . Senile dementia   . Neuropathy   . Anxiety   . History of shingles    Past Surgical History  Procedure Laterality Date  . Vesicovaginal fistula closure w/ tah    . Tonsillectomy    . Abdominal hysterectomy     Family History  Problem Relation Age of Onset  . Transient ischemic attack Father   . Pneumonia Sister    History  Substance Use Topics  . Smoking status: Never Smoker   .  Smokeless tobacco: Never Used  . Alcohol Use: No   OB History   Grav Para Term Preterm Abortions TAB SAB Ect Mult Living                 Review of Systems  Constitutional: Positive for diaphoresis (felt warm but not sweating). Negative for fever.  Respiratory: Positive for shortness of breath. Negative for cough, hemoptysis, sputum production and wheezing.   Cardiovascular: Positive for chest pain (when she was exerting slef it was substernal and aching, resolved). Negative for syncope.  Gastrointestinal: Negative for vomiting and abdominal pain.     Allergies  Review of patient's allergies indicates no known allergies.  Home Medications   Prior to Admission medications   Medication Sig Start Date End Date Taking? Authorizing Provider  acetaZOLAMIDE (DIAMOX) 250 MG tablet TAKE 1 TABLET BY MOUTH DAILY AT 4 PM 04/05/14   [name], MD  ALPRAZolam Prudy Feeler) 0.25 MG tablet Take 0.125-0.25 mg by mouth 3 (three) times daily as needed. For nerves    Historical Provider, MD  aspirin EC 81 MG tablet Take 81 mg by mouth daily.    Historical Provider, MD  azelastine (ASTELIN) 0.1 % nasal spray 1-2 sprays in each nostril  Two times daily as directed 07/20/14   [name], MD  clotrimazole-betamethasone (LOTRISONE) cream Apply topically  2 (two) times daily. 03/23/13   [name], MD  donepezil (ARICEPT) 5 MG tablet TAKE 1 TABLET (5 MG TOTAL) BY MOUTH 2 (TWO) TIMES DAILY. 06/15/14   [name], MD  feeding supplement (ENSURE COMPLETE) LIQD Take 237 mLs by mouth 2 (two) times daily between meals. 10/15/12   Rhetta Mura, MD  gabapentin (NEURONTIN) 100 MG capsule TAKE ONE CAPSULE DAILY FOR 1 WEEK, 1 CAP TWICE DAILY FOR 1 WEEK, THEN 1 CAP 3 TIMES DAILY    [name], MD  Multiple Vitamin (MULTIVITAMIN) tablet Take 1 tablet by mouth daily.      Historical Provider, MD  oxybutynin (DITROPAN) 5 MG tablet Start with 1/2 tablet two times daily 07/20/14   [name], MD  phosphorus (PHOSPHA  250 NEUTRAL) [id number] MG tablet TAKE 1 TABLET BY MOUTH 2 (TWO) TIMES DAILY. 06/15/14   [name], MD  potassium chloride (KLOR-CON 10) 10 MEQ tablet TAKE 2 TABS IN AM AND 2 TABS IN PM 06/15/14   [name], MD  torsemide (DEMADEX) 20 MG tablet TAKE 2 TABLETS BY MOUTH EVERY MORNING 06/15/14   [name], MD   BP 175/78  Pulse 66  Temp(Src) 97.7 F (36.5 C) (Oral)  Resp 28  SpO2 96% Physical Exam  Nursing note and vitals reviewed. Constitutional: She is oriented to person, place, and time. She appears well-developed and well-nourished. No distress.  Frail, elderly  HENT:  Head: Normocephalic and atraumatic.  Nose: Nose normal.  Mouth/Throat: No oropharyngeal exudate.  Eyes: Conjunctivae are normal.  Neck: Normal range of motion. Neck supple. No JVD present. No tracheal deviation present.  Cardiovascular: Normal rate, regular rhythm and normal heart sounds.  Exam reveals no gallop and no friction rub.   No murmur heard. Pulmonary/Chest: Effort normal and breath sounds normal. No respiratory distress. She has no wheezes. She has no rales. She exhibits no tenderness.  Abdominal: Soft. Bowel sounds are normal. She exhibits no distension and no mass. There is no tenderness.  Musculoskeletal: Normal range of motion. She exhibits edema (1-2+ edema to mid calves bilaterally). She exhibits no tenderness.  No calf tenderness, erythema or warmth  Neurological: She is alert and oriented to person, place, and time.  Skin: Skin is warm and dry. No rash noted. She is not diaphoretic.  Psychiatric: She has a normal mood and affect.    ED Course  Procedures (including critical care time) Labs Review Labs Reviewed  APTT - Abnormal; Notable for the following:    aPTT 20 (*)    All other components within normal limits  COMPREHENSIVE METABOLIC PANEL - Abnormal; Notable for the following:    Glucose, Bld 113 (*)    Calcium 10.9 (*)    AST 50 (*)    Total Bilirubin 0.2 (*)    GFR calc non  Af Amer 40 (*)    GFR calc Af Amer 47 (*)    All other components within normal limits            I-STAT TROPOININ, ED - Abnormal; Notable for the following:    Troponin i, poc 0.17 (*)    All other components within normal limits  I-STAT TROPOININ, ED - Abnormal; Notable for the following:    Troponin i, poc 2.62 (*)    All other components within normal limits  MRSA PCR SCREENING  CBC  PROTIME-INR  HEPARIN LEVEL (UNFRACTIONATED)  CBC  TROPONIN I  TROPONIN I  TROPONIN I  TSH  HEMOGLOBIN A1C  LIPID PANEL  BASIC METABOLIC PANEL    Imaging Review Dg Chest Port 1 View  08/29/2014   CLINICAL DATA:  Shortness of breath.  EXAM: PORTABLE CHEST - 1 VIEW  COMPARISON:  10/10/2012.  FINDINGS: Poor inspiration. Grossly stable mild enlargement of the cardiac silhouette. Clear lungs. Diffuse osteopenia. Stable scoliosis.  IMPRESSION: No acute abnormality.   Electronically Signed   By: Gordan Payment M.D.   On: 08/29/2014 19:43     EKG Interpretation   Date/Time:  Monday August 29 2014 18:47:09 EDT Ventricular Rate:  67 PR Interval:  226 QRS Duration: 148 QT Interval:  461 QTC Calculation: 487 R Axis:   -56 Text Interpretation:  Sinus rhythm Prolonged PR interval Probable left  atrial enlargement RBBB and LAFB ST elevation in 1 and L, V2, ST  abnormalities in inferior leads c/w possible borderline  STEMI Confirmed  by MILLER  MD, BRIAN ([id number]) on 08/29/2014 6:54:16 PM      MDM   Final diagnoses:  ST elevation myocardial infarction (STEMI), unspecified artery    EMS in coded with concerning EKG with RBBB with concordant changes in 1, II, V2. CODE STEMI called on arrival. No cardiac history. Pads placed. IV access confirmed.  Cardiology called. Given ASA and heparin on arrival.  Pt confirms desire for cardiac cath after discussion, pt of sound enough mind to make this decision herself.  Trop mildly elevated.  Lungs CTAB. VSS. Comfortable appearing, doubt dissection.    7:20 PM  Cardiology to medically manage. Pt wishes to be DNR.  IV heparing infusing. Exam unchanged  Delta trop 2. Further confirming suspicion for ACS.   Sofie Rower, MD 08/29/14 [id number]

## 2014-08-30 ENCOUNTER — Inpatient Hospital Stay (HOSPITAL_COMMUNITY): Payer: Medicare Other

## 2014-08-30 DIAGNOSIS — I359 Nonrheumatic aortic valve disorder, unspecified: Secondary | ICD-10-CM

## 2014-08-30 DIAGNOSIS — R0602 Shortness of breath: Secondary | ICD-10-CM

## 2014-08-30 DIAGNOSIS — R609 Edema, unspecified: Secondary | ICD-10-CM

## 2014-08-30 DIAGNOSIS — I10 Essential (primary) hypertension: Secondary | ICD-10-CM

## 2014-08-30 LAB — BASIC METABOLIC PANEL
Anion gap: 13 (ref 5–15)
BUN: 14 mg/dL (ref 6–23)
CALCIUM: 10.2 mg/dL (ref 8.4–10.5)
CHLORIDE: 108 meq/L (ref 96–112)
CO2: 24 meq/L (ref 19–32)
Creatinine, Ser: 1.04 mg/dL (ref 0.50–1.10)
GFR calc non Af Amer: 43 mL/min — ABNORMAL LOW (ref >90)
GFR, EST AFRICAN AMERICAN: 49 mL/min — AB (ref >90)
Glucose, Bld: 93 mg/dL (ref 70–99)
Potassium: 3.7 mEq/L (ref 3.7–5.3)
SODIUM: 145 meq/L (ref 137–147)

## 2014-08-30 LAB — CBC
HCT: 37 % (ref 36.0–46.0)
Hemoglobin: 11.9 g/dL — ABNORMAL LOW (ref 12.0–15.0)
MCH: 27.8 pg (ref 26.0–34.0)
MCHC: 32.2 g/dL (ref 30.0–36.0)
MCV: 86.4 fL (ref 78.0–100.0)
Platelets: 194 10*3/uL (ref 150–400)
RBC: 4.28 MIL/uL (ref 3.87–5.11)
RDW: 13.9 % (ref 11.5–15.5)
WBC: 6 10*3/uL (ref 4.0–10.5)

## 2014-08-30 LAB — TSH: TSH: 2.48 u[IU]/mL (ref 0.350–4.500)

## 2014-08-30 LAB — HEMOGLOBIN A1C
Hgb A1c MFr Bld: 6 % — ABNORMAL HIGH (ref <5.7)
MEAN PLASMA GLUCOSE: 126 mg/dL — AB (ref <117)

## 2014-08-30 LAB — TROPONIN I
Troponin I: 5.34 ng/mL (ref <0.30)
Troponin I: 3.91 ng/mL (ref <0.30)
Troponin I: 3.44 ng/mL (ref <0.30)

## 2014-08-30 LAB — LIPID PANEL
Cholesterol: 222 mg/dL — ABNORMAL HIGH (ref 0–200)
HDL: 159 mg/dL (ref >39)
LDL Cholesterol: 51 mg/dL (ref 0–99)
Total CHOL/HDL Ratio: 1.4 RATIO
Triglycerides: 61 mg/dL (ref <150)
VLDL: 12 mg/dL (ref 0–40)

## 2014-08-30 LAB — HEPARIN LEVEL (UNFRACTIONATED)
Heparin Unfractionated: 0.72 IU/mL — ABNORMAL HIGH (ref 0.30–0.70)
Heparin Unfractionated: 0.71 IU/mL — ABNORMAL HIGH (ref 0.30–0.70)
Heparin Unfractionated: 0.52 IU/mL (ref 0.30–0.70)

## 2014-08-30 LAB — MRSA PCR SCREENING: MRSA BY PCR: NEGATIVE

## 2014-08-30 MED ORDER — CETYLPYRIDINIUM CHLORIDE 0.05 % MT LIQD
7.0000 mL | Freq: Two times a day (BID) | OROMUCOSAL | Status: DC
Start: 2014-08-30 — End: 2014-09-02
  Administered 2014-08-30 – 2014-09-02 (×6): 7 mL via OROMUCOSAL

## 2014-08-30 MED ORDER — CLOPIDOGREL BISULFATE 75 MG PO TABS
75.0000 mg | ORAL_TABLET | Freq: Every day | ORAL | Status: DC
  Administered 2014-08-30 – 2014-09-02 (×4): 75 mg via ORAL
  Filled 2014-08-30 (×4): qty 1

## 2014-08-30 MED ORDER — IOHEXOL 350 MG/ML SOLN
80.0000 mL | Freq: Once | INTRAVENOUS | Status: AC | PRN
  Administered 2014-08-30: 80 mL via INTRAVENOUS

## 2014-08-30 NOTE — Progress Notes (Signed)
  Echocardiogram 2D Echocardiogram has been performed.  Arvil Chaco 08/30/2014, 11:54 AM

## 2014-08-30 NOTE — Progress Notes (Signed)
Patient Name: Jessica [name] Date of Encounter: 08/30/2014  Principal Problem:   Dyspnea Active Problems:   HYPERCHOLESTEROLEMIA   HYPERTENSION   Hypercalcemia   Chronic kidney disease (CKD), stage III (moderate)    Patient Profile: 80 yo female w/ hx HTN, HLD, palpitations, toxemia, LE edema, GERD, was admitted 09/14 w/ SOB that started acutely. ECG unchanged, troponin elevated but d-dimer VERY high, CTA pending.  SUBJECTIVE: Pt states breathing better, denies chest pain.   OBJECTIVE Filed Vitals:   08/30/14 0000 08/30/14 0100 08/30/14 0200 08/30/14 0400  BP: 120/55 93/42 104/47 94/46  Pulse: 56 56 31 105  Temp:    97.7 F (36.5 C)  TempSrc:    Oral  Resp: 21 24 28 25   Height:      Weight:      SpO2: 100% 99% 98% 100%    Intake/Output Summary (Last 24 hours) at 08/30/14 0755 Last data filed at 08/30/14 0737  Gross per 24 hour  Intake      0 ml  Output    900 ml  Net   -900 ml   Filed Weights   08/29/14 1900 08/29/14 2300  Weight: 112 lb 3.4 oz (50.9 kg) 110 lb 3.7 oz (50 kg)    PHYSICAL EXAM General: Well developed, well nourished, female in no acute distress. Head: Normocephalic, atraumatic.  Neck: Supple without bruits, JVD minimal elevated. Lungs:  Resp regular and unlabored, few rales bases. Heart: RRR, S1, S2, no S3, S4, 3/6 murmur; no rub. Abdomen: Soft, non-tender, non-distended, BS + x 4.  Extremities: No clubbing, cyanosis, 1+ bilateral pedal edema. No calf tenderness Neuro: Alert and oriented X 3. Moves all extremities spontaneously. Psych: Normal affect.  LABS: CBC: Recent Labs  08/29/14 1854 08/30/14 0612  WBC 8.2 6.0  HGB 13.5 11.9*  HCT 42.8 37.0  MCV 87.3 86.4  PLT 222 194   INR: Recent Labs  08/29/14 1854  INR 0.99   Basic Metabolic Panel: Recent Labs  08/29/14 1854 08/30/14 0612  NA 140 145  K 4.8 3.7  CL 100 108  CO2 26 24  GLUCOSE 113* 93  BUN 14 14  CREATININE 1.09 1.04  CALCIUM 10.9* 10.2   Liver  Function Tests: Recent Labs  08/29/14 1854  AST 50*  ALT 22  ALKPHOS 70  BILITOT 0.2*  PROT 7.9  ALBUMIN 4.1   Cardiac Enzymes: Recent Labs  08/29/14 2319 08/30/14 0612  TROPONINI 5.34* 3.91*    Recent Labs  08/29/14 1901 08/29/14 2142  TROPIPOC 0.17* 2.62*   D-dimer: Recent Labs  08/29/14 1854  DDIMER >20.00*   Fasting Lipid Panel: Recent Labs  08/30/14 0612  CHOL 222*  HDL 159  LDLCALC 51  TRIG 61  CHOLHDL 1.4   Thyroid Function Tests: Recent Labs  08/29/14 2319  TSH 2.480   TELE:        ECG: 08/30/2014 Sinus brady, RBBB is old Vent. rate 49 BPM PR interval 204 ms QRS duration 162 ms QT/QTc 504/455 ms P-R-T axes 78 -84 -3  Radiology/Studies: Dg Chest Port 1 View 08/29/2014   CLINICAL DATA:  Shortness of breath.  EXAM: PORTABLE CHEST - 1 VIEW  COMPARISON:  10/10/2012.  FINDINGS: Poor inspiration. Grossly stable mild enlargement of the cardiac silhouette. Clear lungs. Diffuse osteopenia. Stable scoliosis.  IMPRESSION: No acute abnormality.   Electronically Signed   By: Gordan Payment M.D.   On: 08/29/2014 19:43   Current Medications:  . aspirin EC  81 mg  Oral Daily  . atorvastatin  10 mg Oral q1800  . donepezil  5 mg Oral BID  . feeding supplement (ENSURE COMPLETE)  237 mL Oral BID BM  . hydrALAZINE  25 mg Oral 3 times per day  . metoprolol tartrate  12.5 mg Oral BID  . oxybutynin  2.5 mg Oral BID  . phosphorus  250 mg Oral BID   . sodium chloride 20 mL/hr at 08/29/14 1940  . heparin 600 Units/hr (08/29/14 1940)    ASSESSMENT AND PLAN: Principal Problem:   Dyspnea - Troponin elevated but already trending down. D-dimer very high, CTA chest ordered. Sats are good, symptoms improved on O2 & heparin. Will also ck LE dopplers as LE edema has been going on for a while. Echo ordered. F/u on results. Cancel NPO.   Active Problems:   HYPERCHOLESTEROLEMIA - on statin, HDL very high    HYPERTENSION - BP a little low this am, 175/78 on admission     Hypercalcemia - Ca++ slightly elevated on admit, now improved, Diamox on hold.    Chronic kidney disease (CKD), stage III (moderate) - Cr stable, gentle hydration.    Signed, Theodore Demark , PA-C 7:55 AM 08/30/2014  I have seen and examined the patient along with Theodore Demark , PA-C.  I have reviewed the chart, notes and new data.  I agree with PA's note.  Key new complaints: feels well - no longer dyspneic Key examination changes: no clinical HF, no ventricular arrhythmia. Sinus Huston Foley often in 63s Key new findings / data: CT negative for PE  PLAN: Probable anteroseptal MI, suspect proximal LAD lesion No serious arrhythmia. No CHF Echo pending Conservative Rx - little room for additional beta blockers. If EF low need to consider ACEi, but cautious with recent IV contrast load. IV heparin x 72h. ASA and clopidogrel.  Thurmon Fair, MD, [facility] [phone] 08/30/2014, 10:52 AM

## 2014-08-30 NOTE — Progress Notes (Signed)
ANTICOAGULATION CONSULT NOTE - Follow-up Consult  Pharmacy Consult for Heparin Indication: ACS  No Known Allergies  Patient Measurements: Height: 5' (152.4 cm) Weight: 110 lb 3.7 oz (50 kg) IBW/kg (Calculated) : 45.5 Heparin Dosing Weight: 50 kg  Vital Signs: Temp: 98.2 F (36.8 C) (09/15 1640) Temp src: Oral (09/15 1640) BP: 113/49 mmHg (09/15 1640) Pulse Rate: 47 (09/15 1640)  Labs:  Recent Labs  08/29/14 1854 08/29/14 2319 08/30/14 0159 08/30/14 0612 08/30/14 0839 08/30/14 1213 08/30/14 1800  HGB 13.5  --   --  11.9*  --   --   --   HCT 42.8  --   --  37.0  --   --   --   PLT 222  --   --  194  --   --   --   APTT 20*  --   --   --   --   --   --   LABPROT 13.1  --   --   --   --   --   --   INR 0.99  --   --   --   --   --   --   HEPARINUNFRC  --   --  0.72*  --  0.71*  --  0.52  CREATININE 1.09  --   --  1.04  --   --   --   TROPONINI  --  5.34*  --  3.91*  --  3.44*  --     Estimated Creatinine Clearance: 20.7 ml/min (by C-G formula based on Cr of 1.04).   Assessment: 80 y.o. female on heparin for ACS. Plan for conservative treatment and heparin x 72 hours. Trop 3.44- already trending down. Noted d-dimer also high - CTA chest negative for acute PE. LE dopplers negative.  Heparin level now therapeutic on 550 units/hr.  Goal of Therapy:  Heparin level 0.3-0.7 units/ml Monitor platelets by anticoagulation protocol: Yes   Plan:  1. Continue heparin at 550 units/hr (noted plans to d/c heparin at 72 hours) 2. Daily heparin level and CBC   Jurnee Nakayama, Pharm.D., BCPS Clinical Pharmacist Pager [phone] 08/30/2014 7:01 PM

## 2014-08-30 NOTE — Progress Notes (Signed)
VASCULAR LAB PRELIMINARY  PRELIMINARY  PRELIMINARY  PRELIMINARY  Bilateral lower extremity venous duplex  completed.    Preliminary report:  Bilateral:  No evidence of DVT, superficial thrombosis, or Baker's Cyst.    Ameliya Nicotra, RVT 08/30/2014, 3:11 PM

## 2014-08-30 NOTE — Progress Notes (Signed)
ANTICOAGULATION CONSULT NOTE - Follow Up Consult  Pharmacy Consult for heparin Indication: chest pain/ACS  Labs:  Recent Labs  08/29/14 1854 08/29/14 2319 08/30/14 0159  HGB 13.5  --   --   HCT 42.8  --   --   PLT 222  --   --   APTT 20*  --   --   LABPROT 13.1  --   --   INR 0.99  --   --   HEPARINUNFRC  --   --  0.72*  CREATININE 1.09  --   --   TROPONINI  --  5.34*  --      Assessment/Plan: 80yo female slightly supratherapeutic on heparin with initial dosing for ACS though suspect bolus give 6hr ago is still in effect.  Will continue gtt at current rate for now and recheck level to confirm.   Vernard Gambles, PharmD, BCPS  08/30/2014,2:56 AM

## 2014-08-30 NOTE — Progress Notes (Signed)
ANTICOAGULATION CONSULT NOTE - Follow-up Consult  Pharmacy Consult for Heparin Indication: ACS  No Known Allergies  Patient Measurements: Height: 5' (152.4 cm) Weight: 110 lb 3.7 oz (50 kg) IBW/kg (Calculated) : 45.5 Heparin Dosing Weight: 50 kg  Vital Signs: Temp: 97.4 F (36.3 C) (09/15 1238) Temp src: Oral (09/15 1238) BP: 122/75 mmHg (09/15 1238) Pulse Rate: 47 (09/15 1238)  Labs:  Recent Labs  08/29/14 1854 08/29/14 2319 08/30/14 0159 08/30/14 0612 08/30/14 0839 08/30/14 1213  HGB 13.5  --   --  11.9*  --   --   HCT 42.8  --   --  37.0  --   --   PLT 222  --   --  194  --   --   APTT 20*  --   --   --   --   --   LABPROT 13.1  --   --   --   --   --   INR 0.99  --   --   --   --   --   HEPARINUNFRC  --   --  0.72*  --  0.71*  --   CREATININE 1.09  --   --  1.04  --   --   TROPONINI  --  5.34*  --  3.91*  --  3.44*    Estimated Creatinine Clearance: 20.7 ml/min (by C-G formula based on Cr of 1.04).   Assessment: 80 y.o. female on heparin for ACS. Plan for conservative treatment and heparin x 72 hours. Trop 3.44- already trending down. Noted d-dimer also high - CTA chest negative for acute PE. LE dopplers pending. Heparin level 0.71 (slightly supratherapeutic) on 600 units/hr. Hgb down a bit. No bleeding noted.  Goal of Therapy:  Heparin level 0.3-0.7 units/ml Monitor platelets by anticoagulation protocol: Yes   Plan:  1. Decrease heparin gtt to 550 units/hr (noted plans to d/c heparin at 72 hours) 2. Will f/u 8 hr heparin level 3. Daily heparin level and CBC  Christoper Fabian, PharmD, BCPS Clinical pharmacist, pager [phone] 08/30/2014,2:04 PM

## 2014-08-30 NOTE — Progress Notes (Signed)
DDimer > 20.  Ordered CT angio chest.  Daruis Swaim, PAC

## 2014-08-30 NOTE — Progress Notes (Signed)
Noted bradycardia on the monitor with rate of 39-40.  Systolic bp 105.  Rhonda Barrett notified and instructed to do a EKG to rule out AFib and hold dose of Hydralazine. Results of Ekg called.

## 2014-08-31 DIAGNOSIS — I2589 Other forms of chronic ischemic heart disease: Secondary | ICD-10-CM

## 2014-08-31 DIAGNOSIS — I5041 Acute combined systolic (congestive) and diastolic (congestive) heart failure: Secondary | ICD-10-CM

## 2014-08-31 DIAGNOSIS — I214 Non-ST elevation (NSTEMI) myocardial infarction: Secondary | ICD-10-CM

## 2014-08-31 DIAGNOSIS — I452 Bifascicular block: Secondary | ICD-10-CM

## 2014-08-31 LAB — CBC
HCT: 35.1 % — ABNORMAL LOW (ref 36.0–46.0)
Hemoglobin: 11.4 g/dL — ABNORMAL LOW (ref 12.0–15.0)
MCH: 28.8 pg (ref 26.0–34.0)
MCHC: 32.5 g/dL (ref 30.0–36.0)
MCV: 88.6 fL (ref 78.0–100.0)
Platelets: 190 10*3/uL (ref 150–400)
RBC: 3.96 MIL/uL (ref 3.87–5.11)
RDW: 13.9 % (ref 11.5–15.5)
WBC: 5.4 10*3/uL (ref 4.0–10.5)

## 2014-08-31 LAB — HEPARIN LEVEL (UNFRACTIONATED): Heparin Unfractionated: 0.41 IU/mL (ref 0.30–0.70)

## 2014-08-31 MED ORDER — METOPROLOL SUCCINATE 12.5 MG HALF TABLET
12.5000 mg | ORAL_TABLET | Freq: Every day | ORAL | Status: DC
  Administered 2014-09-02: 12.5 mg via ORAL
  Filled 2014-08-31 (×3): qty 1

## 2014-08-31 NOTE — Progress Notes (Signed)
Pt up in chair this am, assisted with ADL's, HR is currently 48-55. Held pt Metoprolol r/t parameters. Will continue to monitor. Yoshito Gaza V, RN 10:41 AM 08/31/2014

## 2014-08-31 NOTE — Progress Notes (Signed)
Patient Profile: 80 y/o female w/ hx HTN, HLD, palpitations, toxemia, LE edema, GERD, was admitted 09/14 w/ SOB that started acutely. ECG unchanged, troponin elevated but d-dimer VERY high, CTA negative for PE. Venous dopplers negative for DVT. 2D echo demonstrated mildly reduced systolic function with EF 45-50%. Compared to prior 2D echo in 2010, there is a new wall motion abnormality in the distal LAD artery distribution. Probable anteroseptal MI suspected with probable proximal LAD lesion.    Subjective: Continues to deny any chest pain. Dyspnea resolved. No current oxygen requirements. O2 sats at 98% on RA.   Objective: Vital signs in last 24 hours: Temp:  [98 F (36.7 C)-98.3 F (36.8 C)] 98.2 F (36.8 C) (09/16 1136) Pulse Rate:  [40-136] 48 (09/16 1400) Resp:  [15-27] 15 (09/16 1400) BP: (108-160)/(36-90) 112/74 mmHg (09/16 1400) SpO2:  [97 %-100 %] 98 % (09/16 1400) Weight:  [112 lb 7 oz (51 kg)] 112 lb 7 oz (51 kg) (09/16 0429) Last BM Date: 08/29/14  Intake/Output from previous day: 09/15 0701 - 09/16 0700 In: -  Out: 400 [Urine:400] Intake/Output this shift: Total I/O In: 480 [P.O.:480] Out: -   Medications Current Facility-Administered Medications  Medication Dose Route Frequency Provider Last Rate Last Dose  . 0.9 %  sodium chloride infusion   Intravenous Continuous Rhonda G Barrett, PA-C 40 mL/hr at 08/30/14 1020 40 mL at 08/30/14 1020  . acetaminophen (TYLENOL) tablet 650 mg  650 mg Oral Q4H PRN Wilburt Finlay, PA-C      . antiseptic oral rinse (CPC / CETYLPYRIDINIUM CHLORIDE 0.05%) solution 7 mL  7 mL Mouth Rinse BID Runell Gess, MD   7 mL at 08/31/14 0938  . aspirin EC tablet 81 mg  81 mg Oral Daily Wilburt Finlay, PA-C   81 mg at 08/31/14 0947  . atorvastatin (LIPITOR) tablet 10 mg  10 mg Oral q1800 Wilburt Finlay, PA-C   10 mg at 08/30/14 1853  . clopidogrel (PLAVIX) tablet 75 mg  75 mg Oral Q breakfast Girtie Wiersma, MD   75 mg at 08/31/14 0937  . donepezil  (ARICEPT) tablet 5 mg  5 mg Oral BID Wilburt Finlay, PA-C   5 mg at 08/31/14 6333  . feeding supplement (ENSURE COMPLETE) (ENSURE COMPLETE) liquid 237 mL  237 mL Oral BID BM Wilburt Finlay, PA-C   237 mL at 08/31/14 0936  . heparin ADULT infusion 100 units/mL (25000 units/250 mL)  550 Units/hr Intravenous Continuous Hilario Quarry Amend, RPH 5.5 mL/hr at 08/31/14 1300 550 Units/hr at 08/31/14 1300  . hydrALAZINE (APRESOLINE) tablet 25 mg  25 mg Oral 3 times per day Wilburt Finlay, PA-C   25 mg at 08/29/14 2313  . metoprolol tartrate (LOPRESSOR) tablet 12.5 mg  12.5 mg Oral BID Wilburt Finlay, PA-C   12.5 mg at 08/30/14 1036  . nitroGLYCERIN (NITROSTAT) SL tablet 0.4 mg  0.4 mg Sublingual Q5 Min x 3 PRN Wilburt Finlay, PA-C      . ondansetron [facility]) injection 4 mg  4 mg Intravenous Q6H PRN Wilburt Finlay, PA-C      . oxybutynin (DITROPAN) tablet 2.5 mg  2.5 mg Oral BID Wilburt Finlay, PA-C   2.5 mg at 08/31/14 5456  . phosphorus (K PHOS NEUTRAL) tablet 250 mg  250 mg Oral BID Wilburt Finlay, PA-C   250 mg at 08/31/14 2563    PE: General appearance: alert, cooperative and no distress Neck: no carotid bruit Lungs: clear to auscultation bilaterally Heart: slow rate, regular rhythm, 3/6  SM  Extremities: 1+ bilateral edema, L>R Pulses: 2+ and symmetric Skin: warm and dry Neurologic: Grossly normal  Lab Results:   Recent Labs  08/29/14 1854 08/30/14 0612 08/31/14 0248  WBC 8.2 6.0 5.4  HGB 13.5 11.9* 11.4*  HCT 42.8 37.0 35.1*  PLT 222 194 190   BMET  Recent Labs  08/29/14 1854 08/30/14 0612  NA 140 145  K 4.8 3.7  CL 100 108  CO2 26 24  GLUCOSE 113* 93  BUN 14 14  CREATININE 1.09 1.04  CALCIUM 10.9* 10.2   PT/INR  Recent Labs  08/29/14 1854  LABPROT 13.1  INR 0.99   Cholesterol  Recent Labs  08/30/14 0612  CHOL 222*     Assessment/Plan  Principal Problem:   Dyspnea Active Problems:   HYPERCHOLESTEROLEMIA   HYPERTENSION   Hypercalcemia   Chronic kidney disease (CKD), stage  III (moderate)  1. Probable anteroseptal MI: remains stable w/o chest pain and w/o ventricular arhythmia on telemetry. Given age and lack of symptoms, continue medical management: IV heparin x 72 hrs, ASA and Plavix, BB, and statin. May consider initiation of ACE-I tomorrow if renal function is ok, after recent contrast dye load from chest CT. Will order a BMP for the am.   2. Bradycardia: HR in the upper 40s, but seems to be tolerating ok. Currently on 12.5 mg of Lopressor BID. Administration parameters are set to hold for HR <50 and SBP <90.     LOS: 2 days    Brittainy M. Delmer Islam 08/31/2014 2:46 PM  I have seen and examined the patient along with Brittainy M. Sharol Harness, PA-C.  I have reviewed the chart, notes and new data.  I agree with PA's note.  Key new complaints: no angina or dyspnea Key examination changes: no clinical signs of CHF, marked sinus braycardia Key new findings / data: troponin trending down  HDL 159!!!  PLAN: Reduce beta blocker dose - use metoprolol succinate 12.5 mg daily.  Recheck creatinine. If OK, start low dose ACEi. If elevated, add low dose isosorbide to current hydralazine Rx.  Stop IV heparin in AM.  Very unusual lipid profile. Don't know if a statin really makes sense.  May be ready for DC tomorrow.  Thurmon Fair, MD, [facility] [phone] 08/31/2014, 5:28 PM

## 2014-08-31 NOTE — Progress Notes (Signed)
Utilization Review Completed.  

## 2014-08-31 NOTE — Progress Notes (Signed)
ANTICOAGULATION CONSULT NOTE - Follow-up Consult  Pharmacy Consult for Heparin Indication: ACS  No Known Allergies  Patient Measurements: Height: 5' (152.4 cm) Weight: 112 lb 7 oz (51 kg) IBW/kg (Calculated) : 45.5 Heparin Dosing Weight: 50 kg  Vital Signs: Temp: 98.2 F (36.8 C) (09/16 1136) Temp src: Oral (09/16 1136) BP: 120/36 mmHg (09/16 1300) Pulse Rate: 56 (09/16 1300)  Labs:  Recent Labs  08/29/14 1854 08/29/14 2319  08/30/14 0612 08/30/14 0839 08/30/14 1213 08/30/14 1800 08/31/14 0248  HGB 13.5  --   --  11.9*  --   --   --  11.4*  HCT 42.8  --   --  37.0  --   --   --  35.1*  PLT 222  --   --  194  --   --   --  190  APTT 20*  --   --   --   --   --   --   --   LABPROT 13.1  --   --   --   --   --   --   --   INR 0.99  --   --   --   --   --   --   --   HEPARINUNFRC  --   --   < >  --  0.71*  --  0.52 0.41  CREATININE 1.09  --   --  1.04  --   --   --   --   TROPONINI  --  5.34*  --  3.91*  --  3.44*  --   --   < > = values in this interval not displayed.  Estimated Creatinine Clearance: 20.7 ml/min (by C-G formula based on Cr of 1.04).   Assessment: 80 y.o. female on heparin for ACS. Plan for conservative treatment and heparin x 72 hours. Trop 3.44- already trending down. Noted d-dimer also high - CTA chest negative for acute PE. LE dopplers negative. Heparin level remains therapeutic on 550 units/hr. Hgb down a bit. No bleeding noted.  Goal of Therapy:  Heparin level 0.3-0.7 units/ml Monitor platelets by anticoagulation protocol: Yes   Plan:  1. Continue heparin gtt at 550 units/hr (noted plans to d/c heparin at 72 hours) 2. Daily heparin level and CBC  Christoper Fabian, PharmD, BCPS Clinical pharmacist, pager [phone] 08/31/2014,1:55 PM

## 2014-09-01 DIAGNOSIS — R5381 Other malaise: Secondary | ICD-10-CM

## 2014-09-01 DIAGNOSIS — R5383 Other fatigue: Secondary | ICD-10-CM

## 2014-09-01 LAB — CBC
HCT: 37.2 % (ref 36.0–46.0)
HEMOGLOBIN: 12.2 g/dL (ref 12.0–15.0)
MCH: 28 pg (ref 26.0–34.0)
MCHC: 32.8 g/dL (ref 30.0–36.0)
MCV: 85.5 fL (ref 78.0–100.0)
Platelets: 197 10*3/uL (ref 150–400)
RBC: 4.35 MIL/uL (ref 3.87–5.11)
RDW: 13.9 % (ref 11.5–15.5)
WBC: 5.4 10*3/uL (ref 4.0–10.5)

## 2014-09-01 LAB — BASIC METABOLIC PANEL
ANION GAP: 14 (ref 5–15)
BUN: 12 mg/dL (ref 6–23)
CHLORIDE: 105 meq/L (ref 96–112)
CO2: 23 mEq/L (ref 19–32)
Calcium: 9.9 mg/dL (ref 8.4–10.5)
Creatinine, Ser: 0.87 mg/dL (ref 0.50–1.10)
GFR calc Af Amer: 61 mL/min — ABNORMAL LOW (ref >90)
GFR calc non Af Amer: 53 mL/min — ABNORMAL LOW (ref >90)
GLUCOSE: 97 mg/dL (ref 70–99)
POTASSIUM: 3.7 meq/L (ref 3.7–5.3)
SODIUM: 142 meq/L (ref 137–147)

## 2014-09-01 LAB — HEPARIN LEVEL (UNFRACTIONATED): HEPARIN UNFRACTIONATED: 0.44 [IU]/mL (ref 0.30–0.70)

## 2014-09-01 MED ORDER — ALPRAZOLAM 0.5 MG PO TABS
0.5000 mg | ORAL_TABLET | Freq: Three times a day (TID) | ORAL | Status: DC | PRN
  Administered 2014-09-01: 0.5 mg via ORAL
  Filled 2014-09-01: qty 1

## 2014-09-01 MED ORDER — LISINOPRIL 5 MG PO TABS
5.0000 mg | ORAL_TABLET | Freq: Every day | ORAL | Status: DC
Start: 2014-09-01 — End: 2014-09-02
  Administered 2014-09-01 – 2014-09-02 (×2): 5 mg via ORAL
  Filled 2014-09-01 (×2): qty 1

## 2014-09-01 NOTE — Evaluation (Addendum)
Physical Therapy Evaluation Patient Details Name: Jessica [name] MRN: [id number] DOB: July 16, 1911 Today's Date: 09/01/2014   History of Present Illness  Pt. is a 80 year old lady admitted 08/31/14 with acute dyspnea.  Found to have probable anteroseptal MI, Pt. with history of  HTN, hypercalcemia, CKD, toxemia and LE edema. Pt. Presents to PT with a decline in her usual functional level due to acute illness.  Clinical Impression  Pt. Will benefit from acute PT to address these and below issues to allow for transition to SNF then home.I do not believe pt. Could tolerate the rigors of IP rehab and therefore recommend SNF for rehab.    Follow Up Recommendations SNF;Supervision/Assistance - 24 hour    Equipment Recommendations  None recommended by PT    Recommendations for Other Services       Precautions / Restrictions Precautions Precautions: Fall Restrictions Weight Bearing Restrictions: No      Mobility  Bed Mobility Overal bed mobility: Modified Independent             General bed mobility comments: needs increased time and use of bedrail for supine to sit  Transfers Overall transfer level: Needs assistance Equipment used: Rolling walker (2 wheeled) Transfers: Sit to/from Stand Sit to Stand: Min assist         General transfer comment:  min assist for sit <>stand  from bed and to recliner.  Steady assist for transition.    Ambulation/Gait Ambulation/Gait assistance: Min assist Ambulation Distance (Feet): 3 Feet Assistive device: Rolling walker (2 wheeled) Gait Pattern/deviations: Step-to pattern;Trunk flexed Gait velocity: decreased   General Gait Details: Pt. needed min steadying assist for short walk to recliner chair  Stairs            Wheelchair Mobility    Modified Rankin (Stroke Patients Only)       Balance Overall balance assessment: Needs assistance Sitting-balance support: No upper extremity supported;Feet supported Sitting  balance-Leahy Scale: Fair     Standing balance support: Bilateral upper extremity supported;During functional activity Standing balance-Leahy Scale: Poor Standing balance comment: needed support of RW to stabilize self while standing                             Pertinent Vitals/Pain Pain Assessment: No/denies pain HR 55-60 O2 sats 99% on RA Pt. With no distress during therapy session    Home Living Family/patient expects to be discharged to:: Private residence Living Arrangements: Spouse/significant other;Children Available Help at Discharge: Family Type of Home: House Home Access: Level entry     Home Layout: One level Home Equipment: Environmental consultant - 2 wheels;Cane - quad;Wheelchair - manual      Prior Function Level of Independence: Independent with assistive device(s)         Comments: pt. reports she uses quad cane indoors and RW for out of home     Hand Dominance        Extremity/Trunk Assessment   Upper Extremity Assessment: Generalized weakness           Lower Extremity Assessment: Generalized weakness         Communication   Communication: No difficulties  Cognition Arousal/Alertness: Awake/alert Behavior During Therapy: WFL for tasks assessed/performed Overall Cognitive Status: Within Functional Limits for tasks assessed                      General Comments      Exercises  Assessment/Plan    PT Assessment Patient needs continued PT services  PT Diagnosis Difficulty walking;Generalized weakness   PT Problem List Decreased strength;Decreased activity tolerance;Decreased balance;Decreased mobility;Decreased knowledge of use of DME;Cardiopulmonary status limiting activity  PT Treatment Interventions DME instruction;Gait training;Functional mobility training;Therapeutic activities;Therapeutic exercise;Balance training;Patient/family education   PT Goals (Current goals can be found in the Care Plan section) Acute Rehab  PT Goals Patient Stated Goal: rehab then home PT Goal Formulation: With patient Time For Goal Achievement: 09/15/14 Potential to Achieve Goals: Fair    Frequency Min 3X/week   Barriers to discharge Other (comment) (pt. needs more assist than family can provide currently) patient and family would like for pt. to go to [place] place for strengthening prior to return home    Co-evaluation               End of Session Equipment Utilized During Treatment: Gait belt Activity Tolerance: Patient tolerated treatment well;Patient limited by fatigue Patient left: in chair;with call bell/phone within reach;with family/visitor present Nurse Communication: Mobility status         Time: 1445-1514 PT Time Calculation (min): 29 min   Charges:   PT Evaluation $Initial PT Evaluation Tier I: 1 Procedure PT Treatments $Gait Training: 8-22 mins   PT G CodesFerman Hamming 09/01/2014, 3:39 PM Weldon Picking PT Acute Rehab Services [phone] Beeper [phone]

## 2014-09-01 NOTE — Progress Notes (Signed)
ANTICOAGULATION CONSULT NOTE - Follow-up Consult  Pharmacy Consult for Heparin Indication: ACS  No Known Allergies  Patient Measurements: Height: 5' (152.4 cm) Weight: 112 lb 14 oz (51.2 kg) IBW/kg (Calculated) : 45.5 Heparin Dosing Weight: 50 kg  Vital Signs: Temp: 98.2 F (36.8 C) (09/17 1139) Temp src: Oral (09/17 0732) BP: 127/49 mmHg (09/17 1139) Pulse Rate: 57 (09/17 1139)  Labs:  Recent Labs  08/29/14 1854 08/29/14 2319  08/30/14 0612  08/30/14 1213 08/30/14 1800 08/31/14 0248 09/01/14 0315  HGB 13.5  --   --  11.9*  --   --   --  11.4* 12.2  HCT 42.8  --   --  37.0  --   --   --  35.1* 37.2  PLT 222  --   --  194  --   --   --  190 197  APTT 20*  --   --   --   --   --   --   --   --   LABPROT 13.1  --   --   --   --   --   --   --   --   INR 0.99  --   --   --   --   --   --   --   --   HEPARINUNFRC  --   --   < >  --   < >  --  0.52 0.41 0.44  CREATININE 1.09  --   --  1.04  --   --   --   --  0.87  TROPONINI  --  5.34*  --  3.91*  --  3.44*  --   --   --   < > = values in this interval not displayed.  Estimated Creatinine Clearance: 24.7 ml/min (by C-G formula based on Cr of 0.87).   Assessment: 80 y.o. female on heparin for ACS. Plan for conservative treatment and heparin x 72 hours (per Dr. Erin Hearing note on 08/30/14).  Noted d-dimer also high - CTA chest negative for acute PE. LE dopplers negative. Heparin level today = 0.44, remains therapeutic on 550 units/hr. Hgb up a bit. No bleeding noted. Dr. Erin Hearing note yesterday indicates plan was to discontinue the IV heparin this AM but no orders to stop heparin yet.    Goal of Therapy:  Heparin level 0.3-0.7 units/ml Monitor platelets by anticoagulation protocol: Yes   Plan:  Called Dr. Royann Shivers. He said to discontinue the IV heparin.  Will DC daily heparin level and CBC   Noah Delaine, RPh Clinical Pharmacist Pager: [phone] 09/01/2014,2:01 PM

## 2014-09-01 NOTE — Progress Notes (Signed)
Patient Name: Jessica [name] Date of Encounter: 09/01/2014  Principal Problem:   Dyspnea Active Problems:   HYPERCHOLESTEROLEMIA   HYPERTENSION   Hypercalcemia   Chronic kidney disease (CKD), stage III (moderate)   Length of Stay: 3  SUBJECTIVE  No angina or dyspnea. Weak legs.  Sinus brady while asleep, down to 40. HR 60-70s when alert. No physical exam signs of CHF, no ventricular arrhythmia. Frequent PACs/bigeminy.  CURRENT MEDS . antiseptic oral rinse  7 mL Mouth Rinse BID  . aspirin EC  81 mg Oral Daily  . atorvastatin  10 mg Oral q1800  . clopidogrel  75 mg Oral Q breakfast  . donepezil  5 mg Oral BID  . feeding supplement (ENSURE COMPLETE)  237 mL Oral BID BM  . hydrALAZINE  25 mg Oral 3 times per day  . metoprolol succinate  12.5 mg Oral Daily  . oxybutynin  2.5 mg Oral BID  . phosphorus  250 mg Oral BID    OBJECTIVE   Intake/Output Summary (Last 24 hours) at 09/01/14 0804 Last data filed at 09/01/14 0600  Gross per 24 hour  Intake 1420.5 ml  Output      0 ml  Net 1420.5 ml   Filed Weights   08/30/14 0500 08/31/14 0429 09/01/14 0349  Weight: 51 kg (112 lb 7 oz) 51 kg (112 lb 7 oz) 51.2 kg (112 lb 14 oz)    PHYSICAL EXAM Filed Vitals:   09/01/14 0349 09/01/14 0400 09/01/14 0634 09/01/14 0732  BP:  125/53 129/62 122/68  Pulse:  59  66  Temp: 97.9 F (36.6 C)   97.6 F (36.4 C)  TempSrc: Oral   Oral  Resp:  20  21  Height:      Weight: 51.2 kg (112 lb 14 oz)     SpO2:  98%  100%   General: Alert, oriented x3, no distress Head: no evidence of trauma, PERRL, EOMI, no exophtalmos or lid lag, no myxedema, no xanthelasma; normal ears, nose and oropharynx Neck: normal jugular venous pulsations and no hepatojugular reflux; brisk carotid pulses without delay and no carotid bruits Chest: clear to auscultation, no signs of consolidation by percussion or palpation, normal fremitus, symmetrical and full respiratory excursions Cardiovascular: normal position  and quality of the apical impulse, regular rhythm, normal first and second heart sounds, no rubs or gallops, no murmur Abdomen: no tenderness or distention, no masses by palpation, no abnormal pulsatility or arterial bruits, normal bowel sounds, no hepatosplenomegaly Extremities: no clubbing, cyanosis or edema; 2+ radial, ulnar and brachial pulses bilaterally; 2+ right femoral, posterior tibial and dorsalis pedis pulses; 2+ left femoral, posterior tibial and dorsalis pedis pulses; no subclavian or femoral bruits Neurological: grossly nonfocal  LABS  CBC  Recent Labs  08/31/14 0248 09/01/14 0315  WBC 5.4 5.4  HGB 11.4* 12.2  HCT 35.1* 37.2  MCV 88.6 85.5  PLT 190 197   Basic Metabolic Panel  Recent Labs  08/30/14 0612 09/01/14 0315  NA 145 142  K 3.7 3.7  CL 108 105  CO2 24 23  GLUCOSE 93 97  BUN 14 12  CREATININE 1.04 0.87  CALCIUM 10.2 9.9   Liver Function Tests  Recent Labs  08/29/14 1854  AST 50*  ALT 22  ALKPHOS 70  BILITOT 0.2*  PROT 7.9  ALBUMIN 4.1   No results found for this basename: LIPASE, AMYLASE,  in the last 72 hours Cardiac Enzymes  Recent Labs  08/29/14 2319 08/30/14 0612 08/30/14 1213  TROPONINI 5.34* 3.91* 3.44*   BNP No components found with this basename: POCBNP,  D-Dimer  Recent Labs  08/29/14 1854  DDIMER >20.00*   Hemoglobin A1C  Recent Labs  08/29/14 2319  HGBA1C 6.0*   Fasting Lipid Panel  Recent Labs  08/30/14 0612  CHOL 222*  HDL 159  LDLCALC 51  TRIG 61  CHOLHDL 1.4   Thyroid Function Tests  Recent Labs  08/29/14 2319  TSH 2.480    Radiology Studies Imaging results have been reviewed and Ct Angio Chest Pe W/cm &/or Wo Cm  08/30/2014   CLINICAL DATA:  Dyspnea with elevated D-dimer.  EXAM: CT ANGIOGRAPHY CHEST WITH CONTRAST  TECHNIQUE: Multidetector CT imaging of the chest was performed using the standard protocol during bolus administration of intravenous contrast. Multiplanar CT image  reconstructions and MIPs were obtained to evaluate the vascular anatomy.  CONTRAST:  80 cc of Omnipaque 350 intravenously.  COMPARISON:  Portable chest x-ray dated August 29, 2014  FINDINGS: Contrast within the pulmonary arterial tree is normal. There are no filling defects to suggest an acute pulmonary embolism. The caliber of the thoracic aorta is normal. The cardiac chambers are mildly enlarged. There is dense mitral annular calcification. There is no pericardial effusion. There is no lymphadenopathy. The thoracic esophagus is unremarkable. There is a tiny left pleural effusion layering posteriorly.  The lungs exhibit emphysematous changes bilaterally. Subsegmental atelectasis or scarring is present in the costophrenic gutters bilaterally.  Within the upper abdomen there is dense calcification within a midpole calyx of the right kidney. The observed portions of the liver and spleen are unremarkable.  There are degenerative disc changes with levoscoliosis of the thoracic spine. The observed ribs exhibit no acute abnormalities. The sternum is intact.  Review of the MIP images confirms the above findings.  IMPRESSION: 1. There is no acute pulmonary embolism. There is mild enlargement of the cardiac chambers without evidence of CHF. 2. There are emphysematous changes bilaterally. There is atelectasis versus scarring in the posterior costophrenic gutters bilaterally. There is a tiny left pleural effusion. 3. There is scoliosis of the thoracic spine with significant degenerative disc disease. 4. There may be a nonobstructing stone in a midpole calyx of the right kidney.   Electronically Signed   By: David  [place]   On: 08/30/2014 10:16    TELE Sinus brady, PACs   ASSESSMENT AND PLAN  Begin physical therapy. Might need inpatient rehab/transitional care before returning home. DC hydralazine. Good renal function, will start low dose ACEi. I think she will tolerate the current tiny dose of beta blocker, but  no more. Need to avoid polypharmacy. I don't think her lipid profile justifies use of a statin - will stop it. ASA+ (temporary) clopidogrel.   Thurmon Fair, MD, [facility] [phone] office [phone] pager 09/01/2014 8:04 AM

## 2014-09-02 MED ORDER — NITROGLYCERIN 0.4 MG SL SUBL: 0.4000 mg | SUBLINGUAL_TABLET | SUBLINGUAL | Status: AC | PRN

## 2014-09-02 MED ORDER — CLOPIDOGREL BISULFATE 75 MG PO TABS: 75.0000 mg | ORAL_TABLET | Freq: Every day | ORAL | Status: DC

## 2014-09-02 MED ORDER — METOPROLOL SUCCINATE ER 25 MG PO TB24: 12.5000 mg | ORAL_TABLET | Freq: Every day | ORAL | Status: DC

## 2014-09-02 MED ORDER — LISINOPRIL 5 MG PO TABS
5.0000 mg | ORAL_TABLET | Freq: Every day | ORAL | Status: DC
Start: 2014-09-02 — End: 2014-10-31

## 2014-09-02 NOTE — Discharge Summary (Signed)
Physician Discharge Summary  Patient ID: Jessica [name] MRN: [id number] DOB/AGE: 03-30-1911 80 y.o.  Primary Cardiologist: Dr. Royann Shivers  Admit date: 08/29/2014 Discharge date: 09/02/2014  Admission Diagnoses: Dyspnea  Discharge Diagnoses:  Principal Problem:   Dyspnea Active Problems:   HYPERCHOLESTEROLEMIA   HYPERTENSION   Hypercalcemia   Chronic kidney disease (CKD), stage III (moderate)   Discharged Condition: stable  Hospital Course: The patient is a 80 year old frail-appearing Afro-American female with a history of hypertension, toxemia, palpitations, hyperlipidemia, chronic lower extremity edema, GERD, who presented to [facility] on 08/29/14 with a complaint of acute dyspnea. On arrival, O2 stats were 98% on 2L. POC troponin was mildly elevated at 0.17. Actual lab troponin was 5.34. She denied chest pain. Her EKG was reviewed by cardiology in the ED and appeared to be unchanged compared to prior EKGs. Her physical exam was benign. A discussion was held with the patient and her son and paitent reported that given her age, she did not want any agressive/invasive treatments and wished to be a DNR. It was decided to admit to stepdown, continue w/u and treat medically. A D-dimer was ordered and was severely elevated at >20. Subsequently, she was placed on IV heparin and she underwent a chest CT which was negative for PE. Lower extremity venous doppler studies were also negative for DVT. A 2D echo was also obtained and revealed mildly reduced systolic function with an estimated EF of 45-50%. There was hypokinesis of the mid-apical anteroseptal and apical myocardium. Compared to prior study in 2010, this WMA was new and in the distal LAD artery distribution. Doppler parameters were consistent with abnormal left ventricular relaxation (grade 1 diastolic dysfunction). Mild aortic stenosis and regurgitation was noted. It was felt that she had suffered a probable anteroseptal MI with a probable distal  LAD lesion. She continued to deny any chest pain. No arrhthymias were captured on telemetry. No signs of CHF. Continuation of conservative treatment was recommended. She was continued on IV heparin x 72 hrs. Plavix was initiated. Her BB was continued and a low dose ACE-I was added. Her lipids were well controlled with LDL at 51, HDL was 159 and triglycerides were 61 thus statin therapy was not indicated. She had no other issues. She was last seen and examined by Dr. Royann Shivers, who determined she was stable for discharge. Transition through a SNF before returning home was recommended. Case management was consulted and placement was arranged at [facility]. Post-hospital f/u has been arranged with Wilburt Finlay, PA-C on 09/14/14.    Consults: None  Significant Diagnostic Studies:  Chest CT 08/30/14 IMPRESSION:  1. There is no acute pulmonary embolism. There is mild enlargement  of the cardiac chambers without evidence of CHF.  2. There are emphysematous changes bilaterally. There is atelectasis  versus scarring in the posterior costophrenic gutters bilaterally.  There is a tiny left pleural effusion.  3. There is scoliosis of the thoracic spine with significant  degenerative disc disease.  4. There may be a nonobstructing stone in a midpole calyx of the  right kidney.    2D echo 08/30/14 Study Conclusions  - Left ventricle: The cavity size was normal. Wall thickness was increased in a pattern of mild LVH. Systolic function was mildly reduced. The estimated ejection fraction was in the range of 45% to 50%. Hypokinesis of the mid-apicalanteroseptal and apical myocardium. Doppler parameters are consistent with abnormal left ventricular relaxation (grade 1 diastolic dysfunction). - Aortic valve: There was mild stenosis. There was mild regurgitation. Valve  area (VTI): 1.58 cm^2. Valve area (Vmax): 1.45 cm^2. Valve area (Vmean): 1.7 cm^2. - Mitral valve: Severely calcified annulus. Mildly  thickened leaflets . Valve area by pressure half-time: 1.34 cm^2. Valve area by continuity equation (using LVOT flow): 1.22 cm^2. - Left atrium: The atrium was mildly dilated. - Pulmonary arteries: Systolic pressure was mildly increased. PA peak pressure: 40 mm Hg (S).  Impressions:  - Compared to 2010, there is a new wall motion abnormality in the distal LAD artery distribution and mildly reduced overall LVEF.    Treatments: See Hospital Course  Discharge Exam: Blood pressure 141/67, pulse 54, temperature 98.2 F (36.8 C), temperature source Oral, resp. rate 16, height 5' (1.524 m), weight 114 lb 13.8 oz (52.1 kg), SpO2 100.00%.   Disposition: 03-Skilled Nursing Facility      Discharge Instructions   Diet - low sodium heart healthy    Complete by:  As directed      Increase activity slowly    Complete by:  As directed             Medication List    STOP taking these medications       acetaZOLAMIDE 250 MG tablet  Commonly known as:  DIAMOX     potassium chloride 10 MEQ tablet  Commonly known as:  KLOR-CON 10     torsemide 20 MG tablet  Commonly known as:  DEMADEX      TAKE these medications       ALPRAZolam 0.25 MG tablet  Commonly known as:  XANAX  Take 0.125-0.25 mg by mouth 3 (three) times daily as needed. For nerves     aspirin EC 81 MG tablet  Take 81 mg by mouth daily.     clopidogrel 75 MG tablet  Commonly known as:  PLAVIX  Take 1 tablet (75 mg total) by mouth daily with breakfast.     donepezil 5 MG tablet  Commonly known as:  ARICEPT  Take 5 mg by mouth 2 (two) times daily.     feeding supplement (ENSURE COMPLETE) Liqd  Take 237 mLs by mouth 2 (two) times daily between meals.     gabapentin 100 MG capsule  Commonly known as:  NEURONTIN  Take 100 mg by mouth 3 (three) times daily.     lisinopril 5 MG tablet  Commonly known as:  PRINIVIL,ZESTRIL  Take 1 tablet (5 mg total) by mouth daily.     metoprolol succinate 25 MG 24 hr tablet    Commonly known as:  TOPROL XL  Take 0.5 tablets (12.5 mg total) by mouth daily.     multivitamin tablet  Take 1 tablet by mouth daily.     nitroGLYCERIN 0.4 MG SL tablet  Commonly known as:  NITROSTAT  Place 1 tablet (0.4 mg total) under the tongue every 5 (five) minutes x 3 doses as needed for chest pain.     oxybutynin 5 MG tablet  Commonly known as:  DITROPAN  Start with 1/2 tablet two times daily     phosphorus 155-852-130 MG tablet  Commonly known as:  PHOSPHA 250 NEUTRAL  TAKE 1 TABLET BY MOUTH 2 (TWO) TIMES DAILY.       Follow-up Information   Follow up with Wilburt Finlay, PA-C On 09/14/2014. (8:30 am )    Specialty:  Physician Assistant   Contact information:   [address] [place] [address] [phone]     TIME SPENT ON DISCHARGE, INCLUDING PHYSICIAN TIME: >30 MINUTES  Signed:  Edithe [name] 09/02/2014, 2:20 PM

## 2014-09-02 NOTE — Clinical Social Work Placement (Signed)
Clinical Social Work Department CLINICAL SOCIAL WORK PLACEMENT NOTE 09/02/2014  Patient:  Jessica [name], Jessica [name]  Account Number:  [id number] Admit date:  08/29/2014  Clinical Social Worker:  Merlyn Lot, CLINICAL SOCIAL WORKER  Date/time:  09/02/2014 01:48 PM  Clinical Social Work is seeking post-discharge placement for this patient at the following level of care:   SKILLED NURSING   (*CSW will update this form in Epic as items are completed)   09/02/2014  Patient/family provided with [facility] Department of Clinical Social Work's list of facilities offering this level of care within the geographic area requested by the patient (or if unable, by the patient's family).  09/02/2014  Patient/family informed of their freedom to choose among providers that offer the needed level of care, that participate in Medicare, Medicaid or managed care program needed by the patient, have an available bed and are willing to accept the patient.  09/02/2014  Patient/family informed of [facility]' ownership interest in [facility], as well as of the fact that they are under no obligation to receive care at this facility.  PASARR submitted to EDS on 09/02/2014 PASARR number received on 09/02/2014  FL2 transmitted to all facilities in geographic area requested by pt/family on  09/02/2014 FL2 transmitted to all facilities within larger geographic area on   Patient informed that his/her managed care company has contracts with or will negotiate with  certain facilities, including the following:     Patient/family informed of bed offers received:  09/02/2014 Patient chooses bed at [facility] Physician recommends and patient chooses bed at    Patient to be transferred to [facility] on  09/02/2014 Patient to be transferred to facility by [facility] Patient and family notified of transfer on 09/02/2014 Name of family member notified:  Aram Beecham  The following physician request were entered in  Epic:   Additional Comments:   Merlyn Lot, [facility] Clinical Social Worker [phone]

## 2014-09-02 NOTE — Clinical Social Work Psychosocial (Signed)
Clinical Social Work Department BRIEF PSYCHOSOCIAL ASSESSMENT 09/02/2014  Patient:  Jessica [name], Jessica [name]     Account Number:  [id number]     Admit date:  08/29/2014  Clinical Social Worker:  Merlyn Lot, CLINICAL SOCIAL WORKER  Date/Time:  09/02/2014 12:15 PM  Referred by:  Physician  Date Referred:  09/02/2014 Referred for  SNF Placement   Other Referral:   Interview type:  Family Other interview type:    PSYCHOSOCIAL DATA Living Status:  FAMILY Admitted from facility:   Level of care:   Primary support name:  Aram Beecham Primary support relationship to patient:  CHILD, ADULT Degree of support available:   Patients daughter reported high level of support.    CURRENT CONCERNS Current Concerns  Post-Acute Placement   Other Concerns:    SOCIAL WORK ASSESSMENT / PLAN CSW spoke with patients daughter concerning SNF.  Patients daughter and patient want to return to [facility].   Assessment/plan status:  Psychosocial Support/Ongoing Assessment of Needs Other assessment/ plan:   FL2   Information/referral to community resources:   [employer]    PATIENT'S/FAMILY'S RESPONSE TO PLAN OF CARE: Patient and patients daughter agreeable to [facility] and feel comfortable with plan because patient has been there in the past.       Merlyn Lot, [facility] Clinical Social Worker [phone]

## 2014-09-02 NOTE — Progress Notes (Signed)
  Patient Name: Jessica [name] Date of Encounter: 09/02/2014  Principal Problem:   Dyspnea Active Problems:   HYPERCHOLESTEROLEMIA   HYPERTENSION   Hypercalcemia   Chronic kidney disease (CKD), stage III (moderate)   Length of Stay: 4  SUBJECTIVE  Feels well, no dyspnea. Wobbly legs.  CURRENT MEDS . antiseptic oral rinse  7 mL Mouth Rinse BID  . aspirin EC  81 mg Oral Daily  . clopidogrel  75 mg Oral Q breakfast  . donepezil  5 mg Oral BID  . feeding supplement (ENSURE COMPLETE)  237 mL Oral BID BM  . lisinopril  5 mg Oral Daily  . metoprolol succinate  12.5 mg Oral Daily  . oxybutynin  2.5 mg Oral BID  . phosphorus  250 mg Oral BID    OBJECTIVE   Intake/Output Summary (Last 24 hours) at 09/02/14 0824 Last data filed at 09/02/14 0700  Gross per 24 hour  Intake   1240 ml  Output    375 ml  Net    865 ml   Filed Weights   08/31/14 0429 09/01/14 0349 09/02/14 0500  Weight: 51 kg (112 lb 7 oz) 51.2 kg (112 lb 14 oz) 52.1 kg (114 lb 13.8 oz)    PHYSICAL EXAM Filed Vitals:   09/02/14 0400 09/02/14 0500 09/02/14 0600 09/02/14 0700  BP: 122/54 120/47 137/50 136/63  Pulse: 47 42 46 45  Temp: 97.6 F (36.4 C)     TempSrc: Oral     Resp: 24 22 22 27   Height:      Weight:  52.1 kg (114 lb 13.8 oz)    SpO2: 99% 97% 98% 100%   General: Alert, oriented x3, no distress Head: no evidence of trauma, PERRL, EOMI, no exophtalmos or lid lag, no myxedema, no xanthelasma; normal ears, nose and oropharynx Neck: normal jugular venous pulsations and no hepatojugular reflux; brisk carotid pulses without delay and no carotid bruits Chest: clear to auscultation, no signs of consolidation by percussion or palpation, normal fremitus, symmetrical and full respiratory excursions Cardiovascular: normal position and quality of the apical impulse, regular rhythm, normal first and second heart sounds, no rubs, +ve S4 gallop, no murmur Abdomen: no tenderness or distention, no masses by  palpation, no abnormal pulsatility or arterial bruits, normal bowel sounds, no hepatosplenomegaly Extremities: no clubbing, cyanosis or edema; 2+ radial, ulnar and brachial pulses bilaterally; 2+ right femoral, posterior tibial and dorsalis pedis pulses; 2+ left femoral, posterior tibial and dorsalis pedis pulses; no subclavian or femoral bruits Neurological: grossly nonfocal  LABS  CBC  Recent Labs  08/31/14 0248 09/01/14 0315  WBC 5.4 5.4  HGB 11.4* 12.2  HCT 35.1* 37.2  MCV 88.6 85.5  PLT 190 197   Basic Metabolic Panel  Recent Labs  09/01/14 0315  NA 142  K 3.7  CL 105  CO2 23  GLUCOSE 97  BUN 12  CREATININE 0.87  CALCIUM 9.9   Cardiac Enzymes  Recent Labs  08/30/14 1213  TROPONINI 3.44*  Radiology Studies Imaging results have been reviewed and No results found.  TELE Mild sinus bradycardia, frequent PACs   ASSESSMENT AND PLAN  Will need transition through a skilled nursing facility before returning home. Previously was at [facility] and thought it was a good experience.   Thurmon Fair, MD, [facility] [phone] office [phone] pager 09/02/2014 8:24 AM

## 2014-09-02 NOTE — Care Management Note (Signed)
    Page 1 of 1   09/02/2014     4:07:38 PM CARE MANAGEMENT NOTE 09/02/2014  Patient:  Jessica [name], Jessica [name]   Account Number:  [id number]  Date Initiated:  09/02/2014  Documentation initiated by:  Johnye Kist  Subjective/Objective Assessment:   dx dyspnea     Action/Plan:   Anticipated DC Date:     Anticipated DC Plan:    In-house referral  Clinical Social Worker         Choice offered to / List presented to:             Status of service:  Completed, signed off Medicare Important Message given?  YES (If response is "NO", the following Medicare IM given date fields will be blank) Date Medicare IM given:  09/02/2014 Medicare IM given by:  Aarica Wax Date Additional Medicare IM given:   Additional Medicare IM given by:    Discharge Disposition:  SKILLED NURSING FACILITY  Per UR Regulation:  Reviewed for med. necessity/level of care/duration of stay  If discussed at Long Length of Stay Meetings, dates discussed:    Comments:

## 2014-09-02 NOTE — Progress Notes (Signed)
Report called to [facility] RN @ [facility].

## 2014-09-02 NOTE — Clinical Social Work Note (Signed)
Patient will discharge to [facility] Report #: [phone] Anticipated discharge date: 09/02/2014  Family notified: Aram Beecham (daughter) Transportation by [employer]  CSW signing off.  Merlyn Lot, LCSWA Clinical Social Worker [phone]

## 2014-09-05 ENCOUNTER — Non-Acute Institutional Stay (SKILLED_NURSING_FACILITY): Payer: Medicare Other | Admitting: Adult Health

## 2014-09-05 ENCOUNTER — Encounter: Payer: Self-pay | Admitting: Adult Health

## 2014-09-05 DIAGNOSIS — F411 Generalized anxiety disorder: Secondary | ICD-10-CM

## 2014-09-05 DIAGNOSIS — I219 Acute myocardial infarction, unspecified: Secondary | ICD-10-CM

## 2014-09-05 DIAGNOSIS — I213 ST elevation (STEMI) myocardial infarction of unspecified site: Secondary | ICD-10-CM

## 2014-09-05 DIAGNOSIS — F0390 Unspecified dementia without behavioral disturbance: Secondary | ICD-10-CM

## 2014-09-05 DIAGNOSIS — G589 Mononeuropathy, unspecified: Secondary | ICD-10-CM

## 2014-09-06 ENCOUNTER — Non-Acute Institutional Stay (SKILLED_NURSING_FACILITY): Payer: Medicare Other | Admitting: Internal Medicine

## 2014-09-06 DIAGNOSIS — I150 Renovascular hypertension: Secondary | ICD-10-CM

## 2014-09-06 DIAGNOSIS — F0390 Unspecified dementia without behavioral disturbance: Secondary | ICD-10-CM

## 2014-09-06 DIAGNOSIS — N183 Chronic kidney disease, stage 3 unspecified: Secondary | ICD-10-CM

## 2014-09-06 DIAGNOSIS — G589 Mononeuropathy, unspecified: Secondary | ICD-10-CM

## 2014-09-07 NOTE — Progress Notes (Signed)
Patient ID: Jessica [name], female   DOB: 04/03/1911, 80 y.o.   MRN: [id number]               PROGRESS NOTE  DATE: 09/05/2014  FACILITY: Nursing Home Location: [facility]  LEVEL OF CARE: SNF (31)  Acute Visit  CHIEF COMPLAINT:  Follow-up hospitalization  HISTORY OF PRESENT ILLNESS:  This is a 80 year old female who has been admitted to [facility] on 09/02/14 from [facility] with Dyspnea. She has been admitted for a short-term rehabilitation.  REASSESSMENT OF ONGOING PROBLEM(S):  HTN: Pt 's HTN remains stable.  Denies CP, sob, DOE, headaches, dizziness or visual disturbances.  No complications from the medications currently being used.  Last BP : 132/58  PERIPHERAL NEUROPATHY: The peripheral neuropathy is stable. The patient denies pain in the feet, tingling, and numbness. No complications noted from the medication presently being used.  DEMENTIA: The dementia remaines stable and continues to function adequately in the current living environment with supervision.  The patient has had little changes in behavior. No complications noted from the medications presently being used.   PAST MEDICAL HISTORY : Reviewed.  No changes/see problem list  CURRENT MEDICATIONS: Reviewed per MAR/see medication list  REVIEW OF SYSTEMS:  GENERAL: no change in appetite, no fatigue, no weight changes, no fever, chills or weakness RESPIRATORY: no cough, SOB, DOE, wheezing, hemoptysis CARDIAC: no chest pain, or palpitations, + edema GI: no abdominal pain, diarrhea, constipation, heart burn, nausea or vomiting  PHYSICAL EXAMINATION  GENERAL: no acute distress, normal body habitus EYES: conjunctivae normal, sclerae normal, normal eye lids NECK: supple, trachea midline, no neck masses, no thyroid tenderness, no thyromegaly LYMPHATICS: no LAN in the neck, no supraclavicular LAN RESPIRATORY: breathing is even & unlabored, BS CTAB CARDIAC: RRR, no murmur,no extra heart  sounds, BLE edema 2+ GI: abdomen soft, normal BS, no masses, no tenderness, no hepatomegaly, no splenomegaly EXTREMITIES:  Able to move all 4 extremities PSYCHIATRIC: the patient is alert & oriented to person, affect & behavior appropriate  LABS/RADIOLOGY: Labs reviewed: Basic Metabolic Panel:  Recent Labs  85/46/27 1240 07/20/14 1308 08/29/14 1854 08/30/14 0612 09/01/14 0315  NA 142 142 140 145 142  K 4.0 3.1* 4.8 3.7 3.7  CL 106 104 100 108 105  CO2 30 27 26 24 23   GLUCOSE 65* 96 113* 93 97  BUN 16 19 14 14 12   CREATININE 1.1 1.2 1.09 1.04 0.87  CALCIUM 11.2*  11.0* 10.4 10.9* 10.2 9.9  PHOS 2.5 2.9  --   --   --    Liver Function Tests:  Recent Labs  05/20/14 1240 08/29/14 1854  AST 25 50*  ALT 16 22  ALKPHOS 52 70  BILITOT 0.4 0.2*  PROT 6.3 7.9  ALBUMIN 3.7 4.1   CBC:  Recent Labs  10/25/13 1609 05/20/14 1240  08/30/14 0612 08/31/14 0248 09/01/14 0315  WBC 5.2 5.1  < > 6.0 5.4 5.4  NEUTROABS 3.3 3.2  --   --   --   --   HGB 11.8* 12.3  < > 11.9* 11.4* 12.2  HCT 36.0 37.8  < > 37.0 35.1* 37.2  MCV 86.3 87.5  < > 86.4 88.6 85.5  PLT 224.0 215.0  < > 194 190 197  < > = values in this interval not displayed.  Lipid Panel:  Recent Labs  08/30/14 0612  HDL 159   Cardiac Enzymes:  Recent Labs  08/29/14 2319 08/30/14 0612 08/30/14 1213  TROPONINI 5.34* 3.91* 3.44*    EXAM: PORTABLE CHEST - 1 VIEW   COMPARISON:  10/10/2012.   FINDINGS: Poor inspiration. Grossly stable mild enlargement of the cardiac silhouette. Clear lungs. Diffuse osteopenia. Stable scoliosis.   IMPRESSION: No acute abnormality. EXAM: CT ANGIOGRAPHY CHEST WITH CONTRAST   TECHNIQUE: Multidetector CT imaging of the chest was performed using the standard protocol during bolus administration of intravenous contrast. Multiplanar CT image reconstructions and MIPs were obtained to evaluate the vascular anatomy.   CONTRAST:  80 cc of Omnipaque 350 intravenously.     COMPARISON:  Portable chest x-ray dated August 29, 2014   FINDINGS: Contrast within the pulmonary arterial tree is normal. There are no filling defects to suggest an acute pulmonary embolism. The caliber of the thoracic aorta is normal. The cardiac chambers are mildly enlarged. There is dense mitral annular calcification. There is no pericardial effusion. There is no lymphadenopathy. The thoracic esophagus is unremarkable. There is a tiny left pleural effusion layering posteriorly.   The lungs exhibit emphysematous changes bilaterally. Subsegmental atelectasis or scarring is present in the costophrenic gutters bilaterally.   Within the upper abdomen there is dense calcification within a midpole calyx of the right kidney. The observed portions of the liver and spleen are unremarkable.   There are degenerative disc changes with levoscoliosis of the thoracic spine. The observed ribs exhibit no acute abnormalities. The sternum is intact.   Review of the MIP images confirms the above findings.   IMPRESSION: 1. There is no acute pulmonary embolism. There is mild enlargement of the cardiac chambers without evidence of CHF. 2. There are emphysematous changes bilaterally. There is atelectasis versus scarring in the posterior costophrenic gutters bilaterally. There is a tiny left pleural effusion. 3. There is scoliosis of the thoracic spine with significant degenerative disc disease. 4. There may be a nonobstructing stone in a midpole calyx of the right kidney.   ASSESSMENT/PLAN:  STEMI - stable; continue Plavix; patient and her son, given her age, does not want any aggressive/invasive treatments and wished to be DNR Hypertension - well controlled; continue lisinopril and Toprol-XL Hypercalcemia - continue phospha-neutral Anxiety - stable; continue Xanax when necessary Dementia - continue Aricept Neuropathy - stable; continue Neurontin    CPT CODE: 96045  Ella Bodo -  NP [facility] [phone]

## 2014-09-08 DIAGNOSIS — I150 Renovascular hypertension: Secondary | ICD-10-CM | POA: Insufficient documentation

## 2014-09-08 NOTE — Progress Notes (Signed)
HISTORY & PHYSICAL  DATE: 09/06/2014   FACILITY: [facility]  LEVEL OF CARE: SNF (31)  ALLERGIES:  No Known Allergies  CHIEF COMPLAINT:  Manage chronic kidney disease stage III, hypertension and neuropathy  HISTORY OF PRESENT ILLNESS: 80 year old African American female was hospitalized secondary to acute dyspnea. After hospitalization she is admitted to this facility for short-term rehabilitation.  CHRONIC KIDNEY DISEASE: The patient's chronic kidney disease remains stable.  Patient denies increasing lower extremity swelling or confusion. Last BUN and creatinine are: 12, 0.87.  HTN: Pt 's HTN remains stable.  Denies CP, sob, DOE, headaches, dizziness or visual disturbances.  No complications from the medications currently being used. Complains of lower extremity swelling.  Last BP : 129/53.  PERIPHERAL NEUROPATHY: The peripheral neuropathy is stable. The patient denies pain in the feet, tingling, and numbness. No complications noted from the medication presently being used.  PAST MEDICAL HISTORY :  Past Medical History  Diagnosis Date  . Allergy, unspecified not elsewhere classified   . Hypertension   . Palpitations   . Chronic venous insufficiency   . Hypercholesteremia   . GERD (gastroesophageal reflux disease)   . Diverticulosis of colon   . History of urinary tract infection   . Urinary frequency   . Urinary incontinence   . DJD (degenerative joint disease)   . Osteoporosis   . Senile dementia   . Neuropathy   . Anxiety   . History of shingles     PAST SURGICAL HISTORY: Past Surgical History  Procedure Laterality Date  . Vesicovaginal fistula closure w/ tah    . Tonsillectomy    . Abdominal hysterectomy      SOCIAL HISTORY:  reports that she has never smoked. She has never used smokeless tobacco. She reports that she does not drink alcohol or use illicit drugs.  FAMILY HISTORY:  Family History  Problem Relation Age of Onset    . Transient ischemic attack Father   . Pneumonia Sister     CURRENT MEDICATIONS: Reviewed per MAR/see medication list  REVIEW OF SYSTEMS:  See HPI otherwise 14 point ROS is negative.  PHYSICAL EXAMINATION  VS:  See VS section  GENERAL: no acute distress, normal body habitus EYES: conjunctivae normal, sclerae normal, normal eye lids MOUTH/THROAT: lips without lesions,no lesions in the mouth,tongue is without lesions,uvula elevates in midline NECK: supple, trachea midline, no neck masses, no thyroid tenderness, no thyromegaly LYMPHATICS: no LAN in the neck, no supraclavicular LAN RESPIRATORY: breathing is even & unlabored, BS CTAB CARDIAC: RRR, no murmur,no extra heart sounds, +2 bilateral lower extremity edema GI:  ABDOMEN: abdomen soft, normal BS, no masses, no tenderness  LIVER/SPLEEN: no hepatomegaly, no splenomegaly MUSCULOSKELETAL: HEAD: normal to inspection  EXTREMITIES: LEFT UPPER EXTREMITY: full range of motion, normal strength & tone RIGHT UPPER EXTREMITY:  full range of motion, normal strength & tone LEFT LOWER EXTREMITY:  full range of motion, normal strength & tone RIGHT LOWER EXTREMITY:  full range of motion, normal strength & tone PSYCHIATRIC: the patient is alert & oriented to person, affect & behavior appropriate  LABS/RADIOLOGY:  Labs reviewed: Basic Metabolic Panel:  Recent Labs  16/10/96 1240 07/20/14 1308 08/29/14 1854 08/30/14 0612 09/01/14 0315  NA 142 142 140 145 142  K 4.0 3.1* 4.8 3.7 3.7  CL 106 104 100 108 105  CO2 30 27 26 24 23   GLUCOSE 65* 96 113* 93 97  BUN 16 19 14 14  12  CREATININE 1.1 1.2 1.09 1.04 0.87  CALCIUM 11.2*  11.0* 10.4 10.9* 10.2 9.9  PHOS 2.5 2.9  --   --   --    Liver Function Tests:  Recent Labs  05/20/14 1240 08/29/14 1854  AST 25 50*  ALT 16 22  ALKPHOS 52 70  BILITOT 0.4 0.2*  PROT 6.3 7.9  ALBUMIN 3.7 4.1   CBC:  Recent Labs  10/25/13 1609 05/20/14 1240  08/30/14 0612 08/31/14 0248  09/01/14 0315  WBC 5.2 5.1  < > 6.0 5.4 5.4  NEUTROABS 3.3 3.2  --   --   --   --   HGB 11.8* 12.3  < > 11.9* 11.4* 12.2  HCT 36.0 37.8  < > 37.0 35.1* 37.2  MCV 86.3 87.5  < > 86.4 88.6 85.5  PLT 224.0 215.0  < > 194 190 197  < > = values in this interval not displayed.  Lipid Panel:  Recent Labs  08/30/14 0612  HDL 159   Cardiac Enzymes:  Recent Labs  08/29/14 2319 08/30/14 0612 08/30/14 1213  TROPONINI 5.34* 3.91* 3.44*    Transthoracic Echocardiography  Patient:    Yixin, Culliton MR #:       [id number] Study Date: 08/30/2014 Gender:     F Age:        89 Height:     152.4 cm Weight:     49.9 kg BSA:        1.45 m^2 Pt. Status: Room:       2C16C   ADMITTING    Nanetta Batty, MD  ATTENDING    Nanetta Batty, MD  Zenaida Deed, Judie Grieve [id number]  REFERRING    Wilburt Finlay [id number]  SONOGRAPHER  Arvil Chaco  PERFORMING   [facility], Inpatient  cc:  ------------------------------------------------------------------- LV EF: 45% -   50%  ------------------------------------------------------------------- Indications:      Dyspnea 786.09.  ------------------------------------------------------------------- History:   PMH:  Chronic kidney disease III. Senile dementia. Palpitations. Edema. RBBB.  Dyspnea.  Risk factors:  Hypertension. Dyslipidemia.  ------------------------------------------------------------------- Study Conclusions  - Left ventricle: The cavity size was normal. Wall thickness was   increased in a pattern of mild LVH. Systolic function was mildly   reduced. The estimated ejection fraction was in the range of 45%   to 50%. Hypokinesis of the mid-apicalanteroseptal and apical   myocardium. Doppler parameters are consistent with abnormal left   ventricular relaxation (grade 1 diastolic dysfunction). - Aortic valve: There was mild stenosis. There was mild   regurgitation. Valve area (VTI): 1.58 cm^2. Valve area (Vmax):   1.45 cm^2. Valve area  (Vmean): 1.7 cm^2. - Mitral valve: Severely calcified annulus. Mildly thickened   leaflets . Valve area by pressure half-time: 1.34 cm^2. Valve   area by continuity equation (using LVOT flow): 1.22 cm^2. - Left atrium: The atrium was mildly dilated. - Pulmonary arteries: Systolic pressure was mildly increased. PA   peak pressure: 40 mm Hg (S).  Impressions:  - Compared to 2010, there is a new wall motion abnormality in the   distal LAD artery distribution and mildly reduced overall LVEF.  Transthoracic echocardiography.  M-mode, complete 2D, spectral Doppler, and color Doppler.  Birthdate:  Patient birthdate: 06-13-1911.  Age:  Patient is 80 yr old.  Sex:  Gender: female. BMI: 21.5 kg/m^2.  Blood pressure:     94/46  Patient status: Inpatient.  Study date:  Study date: 08/30/2014. Study time: 10:58 AM.  Location:  ICU/CCU  -------------------------------------------------------------------  -------------------------------------------------------------------  Left ventricle:  The cavity size was normal. Wall thickness was increased in a pattern of mild LVH. Systolic function was mildly reduced. The estimated ejection fraction was in the range of 45% to 50%.  Regional wall motion abnormalities:   Hypokinesis of the mid-apicalanteroseptal and apical myocardium. Doppler parameters are consistent with abnormal left ventricular relaxation (grade 1 diastolic dysfunction).  ------------------------------------------------------------------- Aortic valve:   Mildly thickened, mildly calcified leaflets. Doppler:   There was mild stenosis.   There was mild regurgitation.    VTI ratio of LVOT to aortic valve: 0.48. Valve area (VTI): 1.58 cm^2. Indexed valve area (VTI): 1.09 cm^2/m^2. Peak velocity ratio of LVOT to aortic valve: 0.44. Valve area (Vmax): 1.45 cm^2. Indexed valve area (Vmax): 1 cm^2/m^2. Mean velocity ratio of LVOT to aortic valve: 0.51. Valve area (Vmean): 1.7 cm^2. Indexed  valve area (Vmean): 1.17 cm^2/m^2.    Mean gradient (S): 14 mm Hg. Peak gradient (S): 30 mm Hg.  ------------------------------------------------------------------- Aorta:  Aortic root: The aortic root was normal in size. Ascending aorta: The ascending aorta was normal in size. Aortic arch: The aortic arch had severe diffuse disease.  ------------------------------------------------------------------- Mitral valve:   Severely calcified annulus. Mildly thickened leaflets .  Doppler:  There was trivial regurgitation.    Valve area by pressure half-time: 1.34 cm^2. Indexed valve area by pressure half-time: 0.92 cm^2/m^2. Valve area by continuity equation (using LVOT flow): 1.22 cm^2. Indexed valve area by continuity equation (using LVOT flow): 0.84 cm^2/m^2.    Mean gradient (D): 3 mm Hg. Peak gradient (D): 4 mm Hg.  ------------------------------------------------------------------- Left atrium:  The atrium was mildly dilated.  ------------------------------------------------------------------- Right ventricle:  The cavity size was normal. Wall thickness was normal. Systolic function was normal.  ------------------------------------------------------------------- Pulmonic valve:   Poorly visualized.  The valve appears to be grossly normal.  ------------------------------------------------------------------- Tricuspid valve:   Structurally normal valve.   Leaflet separation was normal.  Doppler:  Transvalvular velocity was within the normal range. There was mild regurgitation.  ------------------------------------------------------------------- Pulmonary artery:   Systolic pressure was mildly increased.  ------------------------------------------------------------------- Right atrium:  The atrium was normal in size.  ------------------------------------------------------------------- Pericardium:  There was no pericardial  effusion.  ------------------------------------------------------------------- Measurements   Left ventricle                            Value          Reference  LV ID, ED, PLAX chordal           (L)     26.2  mm       43 - 52  LV ID, ES, PLAX chordal           (L)     14.1  mm       23 - 38  LV fx shortening, PLAX chordal            46    %        >=29  LV PW thickness, ED                       13.7  mm       ---------  IVS/LV PW ratio, ED                       1.07           <=1.3  Stroke volume, 2D  79    ml       ---------  Stroke volume/bsa, 2D                     54    ml/m^2   ---------  LV e&', lateral                            3.21  cm/s     ---------  LV E/e&', lateral                          31.15          ---------  LV e&', medial                             2.65  cm/s     ---------  LV E/e&', medial                           37.74          ---------  LV e&', average                            2.93  cm/s     ---------  LV E/e&', average                          34.13          ---------    Ventricular septum                        Value          Reference  IVS thickness, ED                         14.6  mm       ---------    LVOT                                      Value          Reference  LVOT ID, S                                20.5  mm       ---------  LVOT area                                 3.3   cm^2     ---------  LVOT peak velocity, S                     120   cm/s     ---------  LVOT mean velocity, S                     89.6  cm/s     ---------  LVOT VTI, S  32.9  cm       ---------  LVOT peak gradient, S                     6     mm Hg    ---------  Stroke volume (SV), LVOT DP               108.6 ml       ---------  Stroke index (SV/bsa), LVOT DP            74.9  ml/m^2   ---------    Aortic valve                              Value          Reference  Aortic valve peak velocity, S             274   cm/s      ---------  Aortic valve mean velocity, S             174   cm/s     ---------  Aortic valve VTI, S                       68.6  cm       ---------  Aortic mean gradient, S                   14    mm Hg    ---------  Aortic peak gradient, S                   30    mm Hg    ---------  VTI ratio, LVOT/AV                        0.48           ---------  Aortic valve area, VTI                    1.58  cm^2     ---------  Aortic valve area/bsa, VTI                1.09  cm^2/m^2 ---------  Velocity ratio, peak, LVOT/AV             0.44           ---------  Aortic valve area, peak velocity          1.45  cm^2     ---------  Aortic valve area/bsa, peak               1     cm^2/m^2 ---------  velocity  Velocity ratio, mean, LVOT/AV             0.51           ---------  Aortic valve area, mean velocity          1.7   cm^2     ---------  Aortic valve area/bsa, mean               1.17  cm^2/m^2 ---------  velocity  Aortic regurg pressure half-time          614   ms       ---------    Aorta  Value          Reference  Aortic root ID, ED                        28    mm       ---------    Left atrium                               Value          Reference  LA ID, A-P, ES                            40.36 mm       ---------  LA ID/bsa, A-P                    (H)     2.78  cm/m^2   <=2.2    Mitral valve                              Value          Reference  Mitral E-wave peak velocity               100   cm/s     ---------  Mitral A-wave peak velocity               203   cm/s     ---------  Mitral mean velocity, D                   77.4  cm/s     ---------  Mitral deceleration time          (H)     574   ms       150 - 230  Mitral pressure half-time                 164   ms       ---------  Mitral mean gradient, D                   3     mm Hg    ---------  Mitral peak gradient, D                   4     mm Hg    ---------  Mitral E/A ratio, peak                    0.5             ---------  Mitral valve area, PHT, DP                1.34  cm^2     ---------  Mitral valve area/bsa, PHT, DP            0.92  cm^2/m^2 ---------  Mitral valve area, LVOT                   1.22  cm^2     ---------  continuity  Mitral valve area/bsa, LVOT               0.84  cm^2/m^2 ---------  continuity  Mitral annulus VTI, D  64.8  cm       ---------    Pulmonary arteries                        Value          Reference  PA pressure, S, DP                (H)     40    mm Hg    <=30    Tricuspid valve                           Value          Reference  Tricuspid regurg peak velocity            304   cm/s     ---------  Tricuspid peak RV-RA gradient             37    mm Hg    ---------  Tricuspid maximal regurg                  304   cm/s     ---------  velocity, PISA    Systemic veins                            Value          Reference  Estimated CVP                             3     mm Hg    ---------    Right ventricle                           Value          Reference  RV pressure, S, DP                (H)     40    mm Hg    <=30  RV s&', lateral, S                         10.2  cm/s     ---------  Legend: (L)  and  (H)  mark values outside specified reference range.  Bilateral Lower Extremity Venous Duplex Evaluation  Patient:    Sianne, Tejada MR #:       [id number] Study Date: 08/30/2014 Gender:     F Age:        89 Height: Weight: BSA: Pt. Status: Room:       2C16C   ATTENDING    Nanetta Batty, MD  SONOGRAPHER  Thereasa Parkin, RVT  ORDERING     Barrett, Joline Salt  Reports also to:  ------------------------------------------------------------------- History and indications:  Indications  729.81 Swelling of limb.  History  Diagnostic evaluation. Chronic lower extremity swelling. Became acutely short of breath after cleaning out a closet at home. SOB not relieved with rest or air  conditioning.  ------------------------------------------------------------------- Study information:  Study status:  Routine.  Procedure:  A vascular evaluation was performed with the patient in the supine position. The right common femoral, right femoral, right profunda femoral, right popliteal, right peroneal, right posterior tibial, left common femoral, left femoral, left profunda femoral, left popliteal, left peroneal,  and left posterior tibial veins were studied.    Bilateral lower extremity venous duplex evaluation.     Doppler flow study including B-mode compression maneuvers of all visualized segments, color flow Doppler and selected views of pulsed wave Doppler. Birthdate:  Patient birthdate: 1911/07/26.  Age:  Patient is 80 yr old.  Sex:  Gender: female.  Study date:  Study date: 08/30/2014. Study time: 02:44 PM.  Location:  Bedside.  Patient status: Inpatient.  Venous flow:  +--------------------------+-------+------------------------------+ Location                  OverallFlow properties                +--------------------------+-------+------------------------------+ Right common femoral      Patent Phasic; spontaneous;                                            compressible                   +--------------------------+-------+------------------------------+ Right femoral             Patent Compressible                   +--------------------------+-------+------------------------------+ Right profunda femoral    Patent Compressible                   +--------------------------+-------+------------------------------+ Right popliteal           Patent Phasic; spontaneous;                                            compressible                   +--------------------------+-------+------------------------------+ Right posterior tibial    Patent Compressible                    +--------------------------+-------+------------------------------+ Right peroneal            Patent Compressible                   +--------------------------+-------+------------------------------+ Right saphenofemoral      Patent Compressible                   junction                                                        +--------------------------+-------+------------------------------+ Left common femoral       Patent Phasic; spontaneous;                                            compressible                   +--------------------------+-------+------------------------------+ Left femoral              Patent Compressible                   +--------------------------+-------+------------------------------+ Left profunda femoral     Patent Compressible                   +--------------------------+-------+------------------------------+  Left popliteal            Patent Phasic; spontaneous;                                            compressible                   +--------------------------+-------+------------------------------+ Left posterior tibial     Patent Compressible                   +--------------------------+-------+------------------------------+ Left peroneal             Patent Compressible                   +--------------------------+-------+------------------------------+ Left saphenofemoral       Patent Compressible                   junction                                                        +--------------------------+-------+------------------------------+  ------------------------------------------------------------------- Summary:  - No evidence of deep vein thrombosis involving the right lower   extremity. - No evidence of deep vein thrombosis involving the left lower   extremity PORTABLE CHEST - 1 VIEW   COMPARISON:  10/10/2012.   FINDINGS: Poor inspiration. Grossly stable mild enlargement of  the cardiac silhouette. Clear lungs. Diffuse osteopenia. Stable scoliosis.   IMPRESSION: No acute abnormality. CT ANGIOGRAPHY CHEST WITH CONTRAST   TECHNIQUE: Multidetector CT imaging of the chest was performed using the standard protocol during bolus administration of intravenous contrast. Multiplanar CT image reconstructions and MIPs were obtained to evaluate the vascular anatomy.   CONTRAST:  80 cc of Omnipaque 350 intravenously.   COMPARISON:  Portable chest x-ray dated August 29, 2014   FINDINGS: Contrast within the pulmonary arterial tree is normal. There are no filling defects to suggest an acute pulmonary embolism. The caliber of the thoracic aorta is normal. The cardiac chambers are mildly enlarged. There is dense mitral annular calcification. There is no pericardial effusion. There is no lymphadenopathy. The thoracic esophagus is unremarkable. There is a tiny left pleural effusion layering posteriorly.   The lungs exhibit emphysematous changes bilaterally. Subsegmental atelectasis or scarring is present in the costophrenic gutters bilaterally.   Within the upper abdomen there is dense calcification within a midpole calyx of the right kidney. The observed portions of the liver and spleen are unremarkable.   There are degenerative disc changes with levoscoliosis of the thoracic spine. The observed ribs exhibit no acute abnormalities. The sternum is intact.   Review of the MIP images confirms the above findings.   IMPRESSION: 1. There is no acute pulmonary embolism. There is mild enlargement of the cardiac chambers without evidence of CHF. 2. There are emphysematous changes bilaterally. There is atelectasis versus scarring in the posterior costophrenic gutters bilaterally. There is a tiny left pleural effusion. 3. There is scoliosis of the thoracic spine with significant degenerative disc disease. 4. There may be a nonobstructing stone in a midpole calyx of  the right kidney.     ASSESSMENT/PLAN:  Chronic kidney disease stage III-renal functions normalized. Renovascular hypertension-well-controlled Neuropathy-continue Neurontin Dementia-continue Aricept Urinary incontinence-continue ditropan Bradycardia-hold  metoprolol if pulse less than or equal to 50  I have reviewed patient's medical records received at admission/from hospitalization.  CPT CODE: 16109  Angela Cox, MD [facility] [phone]

## 2014-09-13 ENCOUNTER — Telehealth: Payer: Self-pay | Admitting: Pulmonary Disease

## 2014-09-13 ENCOUNTER — Telehealth: Payer: Self-pay | Admitting: Cardiovascular Disease

## 2014-09-13 NOTE — Telephone Encounter (Signed)
Spoke with pt daughter Aram Beecham. Pt had heart attack 08/29/14- was in seen in hospital and was D/C from hospital to [facility].  Daughter spoke with pt this AM and she said that she complained of being very tired and weak, cannot walk and feels anxious. Pt had gained weight since D/C -- pt weighed 110lb at D/C and now weighs 129lb -- daughter states that she does not think she's been taking a fluid pill. Daughter is not sure what all meds they are giving her at the [facility]. Pt nurse Ext is [phone]   Pt room # 904-1 Pt is supposed to be D/C from [facility] tomorrow but daughter does not feel like she is ready to come home with the symptoms she is having.  Upcoming appt with SN 09/20/14 at 10a  Pt daughter wanting some rec's from Dr Kriste Basque on what could possibly be causing her symptoms. Please advise. Thanks.

## 2014-09-13 NOTE — Telephone Encounter (Signed)
Spoke with pt dtr, she reports the patients weight is up to 129 from 110 when she was in the hosp The patient tells her dtr she is weak and does not feel like walking around. She reports no appetite and not eating. She also reports not as much urine output as before. They have an appointment to see dr Kriste Basque next week and they want to be seen sooner. Will discuss with brittney simmons pac

## 2014-09-13 NOTE — Telephone Encounter (Signed)
Discussed with Jessica [name] pac, pt to start Demadex 20 mg once daily for next 3 days then stop. Then she will follow up as scheduled with dr Kriste Basque next week. Spoke to [facility], they requested I fax the orders to [phone]. dtr aware of above.

## 2014-09-13 NOTE — Telephone Encounter (Signed)
Pt wants to talk to DR C,concerning the pt.The pt is not doing that well and she needs to know what to do.Ifshe is not at home please call her on her cell phone-[phone]

## 2014-09-14 ENCOUNTER — Ambulatory Visit: Payer: Medicare Other | Admitting: Physician Assistant

## 2014-09-14 NOTE — Telephone Encounter (Signed)
Per SN---  If not ready to come home from rehab from [facility], it sounds like she will need to have nursing home placement.

## 2014-09-14 NOTE — Telephone Encounter (Signed)
Spoke with Cynthina- she is aware if they feel the patient is not ready to go home then they need to discuss with Child psychotherapist at [facility] about long term nursing area. She will speak with Marylene Land the Child psychotherapist at [facility]; if any FL2 needed she will bring this by the office.

## 2014-09-14 NOTE — Telephone Encounter (Signed)
LMTCB for [employer]

## 2014-09-14 NOTE — Telephone Encounter (Signed)
Jessica [name] returned call [phone]

## 2014-09-15 ENCOUNTER — Encounter: Payer: Self-pay | Admitting: Adult Health

## 2014-09-15 ENCOUNTER — Non-Acute Institutional Stay (SKILLED_NURSING_FACILITY): Payer: Medicare Other | Admitting: Adult Health

## 2014-09-15 DIAGNOSIS — F419 Anxiety disorder, unspecified: Secondary | ICD-10-CM

## 2014-09-15 DIAGNOSIS — F0390 Unspecified dementia without behavioral disturbance: Secondary | ICD-10-CM

## 2014-09-15 DIAGNOSIS — I10 Essential (primary) hypertension: Secondary | ICD-10-CM

## 2014-09-15 DIAGNOSIS — G629 Polyneuropathy, unspecified: Secondary | ICD-10-CM

## 2014-09-15 DIAGNOSIS — I213 ST elevation (STEMI) myocardial infarction of unspecified site: Secondary | ICD-10-CM

## 2014-09-15 NOTE — Progress Notes (Signed)
Patient ID: Jessica [name], female   DOB: Dec 11, 1911, 80 y.o.   MRN: [id number]              PROGRESS NOTE  DATE:       09/15/14  FACILITY: Nursing Home Location: [facility]  LEVEL OF CARE: SNF (31)  Discharge Notes  HISTORY OF PRESENT ILLNESS:  This is a 80 year old female who is for discharge home with Home health PT, OT, CNA, Nursing and Child psychotherapist. She has been admitted to [facility] on 09/02/14 from [facility] with Dyspnea. Patient was admitted to this facility for short-term rehabilitation after the patient's recent hospitalization.  Patient has completed SNF rehabilitation and therapy has cleared the patient for discharge.  REASSESSMENT OF ONGOING PROBLEM(S):  HTN: Pt 's HTN remains stable.  Denies CP, sob, DOE, headaches, dizziness or visual disturbances.  No complications from the medications currently being used.  Last BP : 143/68  ANXIETY: The anxiety remains stable. Patient denies ongoing anxiety or irritability. No complications reported from the medications currently being used.  DEMENTIA: The dementia remaines stable and continues to function adequately in the current living environment with supervision.  The patient has had little changes in behavior. No complications noted from the medications presently being used.  PAST MEDICAL HISTORY : Reviewed.  No changes/see problem list  CURRENT MEDICATIONS: Reviewed per MAR/see medication list  REVIEW OF SYSTEMS:  GENERAL: no change in appetite, no fatigue, no weight changes, no fever, chills or weakness RESPIRATORY: no cough, SOB, DOE, wheezing, hemoptysis CARDIAC: no chest pain, or palpitations, + edema GI: no abdominal pain, diarrhea, constipation, heart burn, nausea or vomiting  PHYSICAL EXAMINATION  GENERAL: no acute distress, normal body habitus NECK: supple, trachea midline, no neck masses, no thyroid tenderness, no thyromegaly LYMPHATICS: no LAN in the neck, no supraclavicular  LAN RESPIRATORY: breathing is even & unlabored, BS CTAB CARDIAC: RRR, no murmur,no extra heart sounds, BLE edema 2+ GI: abdomen soft, normal BS, no masses, no tenderness, no hepatomegaly, no splenomegaly EXTREMITIES:  Able to move all 4 extremities PSYCHIATRIC: the patient is alert & oriented to person, affect & behavior appropriate  LABS/RADIOLOGY: Labs reviewed: Basic Metabolic Panel:  Recent Labs  01/00/71 1240 07/20/14 1308 08/29/14 1854 08/30/14 0612 09/01/14 0315  NA 142 142 140 145 142  K 4.0 3.1* 4.8 3.7 3.7  CL 106 104 100 108 105  CO2 30 27 26 24 23   GLUCOSE 65* 96 113* 93 97  BUN 16 19 14 14 12   CREATININE 1.1 1.2 1.09 1.04 0.87  CALCIUM 11.2*  11.0* 10.4 10.9* 10.2 9.9  PHOS 2.5 2.9  --   --   --    Liver Function Tests:  Recent Labs  05/20/14 1240 08/29/14 1854  AST 25 50*  ALT 16 22  ALKPHOS 52 70  BILITOT 0.4 0.2*  PROT 6.3 7.9  ALBUMIN 3.7 4.1   CBC:  Recent Labs  10/25/13 1609 05/20/14 1240  08/30/14 0612 08/31/14 0248 09/01/14 0315  WBC 5.2 5.1  < > 6.0 5.4 5.4  NEUTROABS 3.3 3.2  --   --   --   --   HGB 11.8* 12.3  < > 11.9* 11.4* 12.2  HCT 36.0 37.8  < > 37.0 35.1* 37.2  MCV 86.3 87.5  < > 86.4 88.6 85.5  PLT 224.0 215.0  < > 194 190 197  < > = values in this interval not displayed.  Lipid Panel:  Recent Labs  08/30/14 0612  HDL 159   Cardiac Enzymes:  Recent Labs  08/29/14 2319 08/30/14 0612 08/30/14 1213  TROPONINI 5.34* 3.91* 3.44*    EXAM: PORTABLE CHEST - 1 VIEW   COMPARISON:  10/10/2012.   FINDINGS: Poor inspiration. Grossly stable mild enlargement of the cardiac silhouette. Clear lungs. Diffuse osteopenia. Stable scoliosis.   IMPRESSION: No acute abnormality. EXAM: CT ANGIOGRAPHY CHEST WITH CONTRAST   TECHNIQUE: Multidetector CT imaging of the chest was performed using the standard protocol during bolus administration of intravenous contrast. Multiplanar CT image reconstructions and MIPs  were obtained to evaluate the vascular anatomy.   CONTRAST:  80 cc of Omnipaque 350 intravenously.   COMPARISON:  Portable chest x-ray dated August 29, 2014   FINDINGS: Contrast within the pulmonary arterial tree is normal. There are no filling defects to suggest an acute pulmonary embolism. The caliber of the thoracic aorta is normal. The cardiac chambers are mildly enlarged. There is dense mitral annular calcification. There is no pericardial effusion. There is no lymphadenopathy. The thoracic esophagus is unremarkable. There is a tiny left pleural effusion layering posteriorly.   The lungs exhibit emphysematous changes bilaterally. Subsegmental atelectasis or scarring is present in the costophrenic gutters bilaterally.   Within the upper abdomen there is dense calcification within a midpole calyx of the right kidney. The observed portions of the liver and spleen are unremarkable.   There are degenerative disc changes with levoscoliosis of the thoracic spine. The observed ribs exhibit no acute abnormalities. The sternum is intact.   Review of the MIP images confirms the above findings.   IMPRESSION: 1. There is no acute pulmonary embolism. There is mild enlargement of the cardiac chambers without evidence of CHF. 2. There are emphysematous changes bilaterally. There is atelectasis versus scarring in the posterior costophrenic gutters bilaterally. There is a tiny left pleural effusion. 3. There is scoliosis of the thoracic spine with significant degenerative disc disease. 4. There may be a nonobstructing stone in a midpole calyx of the right kidney.   ASSESSMENT/PLAN:  STEMI - stable; continue Plavix; patient and her son, given her age, does not want any aggressive/invasive treatments and wished to be DNR Hypertension - well controlled; continue lisinopril and Toprol-XL Hypercalcemia - continue phospha-neutral Anxiety - stable; continue Xanax when  necessary Dementia - continue Aricept Neuropathy - stable; continue Neurontin     I have filled out patient's discharge paperwork and written prescriptions.  Patient will receive home health PT, OT, ST, Nursing, Child psychotherapistocial Worker and CNA.  Total discharge time: Less than 30 minutes  Discharge time involved coordination of the discharge process with Child psychotherapistsocial worker, nursing staff and therapy department. Medical justification for home health services verified.   CPT CODE: [id number]  Ella BodoMonina Vargas - NP [facility] [phone]

## 2014-09-16 ENCOUNTER — Encounter (HOSPITAL_COMMUNITY): Payer: Self-pay | Admitting: Emergency Medicine

## 2014-09-16 ENCOUNTER — Emergency Department (HOSPITAL_COMMUNITY): Payer: Medicare Other

## 2014-09-16 ENCOUNTER — Emergency Department (HOSPITAL_COMMUNITY)
Admission: EM | Admit: 2014-09-16 | Discharge: 2014-09-16 | Disposition: A | Discharge status: Home / Self Care | Payer: Medicare Other | Attending: Emergency Medicine | Admitting: Emergency Medicine

## 2014-09-16 DIAGNOSIS — Z8619 Personal history of other infectious and parasitic diseases: Secondary | ICD-10-CM | POA: Diagnosis not present

## 2014-09-16 DIAGNOSIS — Z79899 Other long term (current) drug therapy: Secondary | ICD-10-CM | POA: Diagnosis not present

## 2014-09-16 DIAGNOSIS — F419 Anxiety disorder, unspecified: Secondary | ICD-10-CM | POA: Diagnosis not present

## 2014-09-16 DIAGNOSIS — Z7902 Long term (current) use of antithrombotics/antiplatelets: Secondary | ICD-10-CM | POA: Insufficient documentation

## 2014-09-16 DIAGNOSIS — Z8639 Personal history of other endocrine, nutritional and metabolic disease: Secondary | ICD-10-CM | POA: Diagnosis not present

## 2014-09-16 DIAGNOSIS — F0390 Unspecified dementia without behavioral disturbance: Secondary | ICD-10-CM | POA: Diagnosis not present

## 2014-09-16 DIAGNOSIS — M1990 Unspecified osteoarthritis, unspecified site: Secondary | ICD-10-CM | POA: Insufficient documentation

## 2014-09-16 DIAGNOSIS — Z008 Encounter for other general examination: Secondary | ICD-10-CM | POA: Diagnosis present

## 2014-09-16 DIAGNOSIS — R202 Paresthesia of skin: Secondary | ICD-10-CM | POA: Diagnosis not present

## 2014-09-16 DIAGNOSIS — Z7982 Long term (current) use of aspirin: Secondary | ICD-10-CM | POA: Diagnosis not present

## 2014-09-16 DIAGNOSIS — I10 Essential (primary) hypertension: Secondary | ICD-10-CM | POA: Insufficient documentation

## 2014-09-16 DIAGNOSIS — Z8719 Personal history of other diseases of the digestive system: Secondary | ICD-10-CM | POA: Diagnosis not present

## 2014-09-16 DIAGNOSIS — G629 Polyneuropathy, unspecified: Secondary | ICD-10-CM | POA: Diagnosis not present

## 2014-09-16 DIAGNOSIS — Z87440 Personal history of urinary (tract) infections: Secondary | ICD-10-CM | POA: Insufficient documentation

## 2014-09-16 LAB — CBC WITH DIFFERENTIAL/PLATELET
BASOS ABS: 0 10*3/uL (ref 0.0–0.1)
BASOS PCT: 1 % (ref 0–1)
EOS ABS: 0 10*3/uL (ref 0.0–0.7)
EOS PCT: 0 % (ref 0–5)
HEMATOCRIT: 37.9 % (ref 36.0–46.0)
HEMOGLOBIN: 11.7 g/dL — AB (ref 12.0–15.0)
Lymphocytes Relative: 14 % (ref 12–46)
Lymphs Abs: 0.8 10*3/uL (ref 0.7–4.0)
MCH: 27.6 pg (ref 26.0–34.0)
MCHC: 30.9 g/dL (ref 30.0–36.0)
MCV: 89.4 fL (ref 78.0–100.0)
MONO ABS: 0.5 10*3/uL (ref 0.1–1.0)
MONOS PCT: 9 % (ref 3–12)
NEUTROS ABS: 4.3 10*3/uL (ref 1.7–7.7)
Neutrophils Relative %: 76 % (ref 43–77)
Platelets: 330 10*3/uL (ref 150–400)
RBC: 4.24 MIL/uL (ref 3.87–5.11)
RDW: 14.4 % (ref 11.5–15.5)
WBC: 5.7 10*3/uL (ref 4.0–10.5)

## 2014-09-16 LAB — I-STAT TROPONIN, ED: TROPONIN I, POC: 0.06 ng/mL (ref 0.00–0.08)

## 2014-09-16 LAB — COMPREHENSIVE METABOLIC PANEL
ALBUMIN: 3.2 g/dL — AB (ref 3.5–5.2)
ALT: 36 U/L — ABNORMAL HIGH (ref 0–35)
AST: 36 U/L (ref 0–37)
Alkaline Phosphatase: 66 U/L (ref 39–117)
Anion gap: 9 (ref 5–15)
BUN: 12 mg/dL (ref 6–23)
CALCIUM: 10.8 mg/dL — AB (ref 8.4–10.5)
CHLORIDE: 106 meq/L (ref 96–112)
CO2: 32 mEq/L (ref 19–32)
CREATININE: 1.01 mg/dL (ref 0.50–1.10)
GFR calc Af Amer: 51 mL/min — ABNORMAL LOW (ref >90)
GFR, EST NON AFRICAN AMERICAN: 44 mL/min — AB (ref >90)
Glucose, Bld: 104 mg/dL — ABNORMAL HIGH (ref 70–99)
Potassium: 4.1 mEq/L (ref 3.7–5.3)
Sodium: 147 mEq/L (ref 137–147)
Total Bilirubin: 0.2 mg/dL — ABNORMAL LOW (ref 0.3–1.2)
Total Protein: 6.7 g/dL (ref 6.0–8.3)

## 2014-09-16 MED ORDER — FUROSEMIDE 10 MG/ML IJ SOLN
40.0000 mg | Freq: Once | INTRAMUSCULAR | Status: AC
  Administered 2014-09-16: 40 mg via INTRAVENOUS
  Filled 2014-09-16: qty 4

## 2014-09-16 MED ORDER — METOPROLOL SUCCINATE ER 25 MG PO TB24
25.0000 mg | ORAL_TABLET | Freq: Once | ORAL | Status: DC
  Filled 2014-09-16: qty 1

## 2014-09-16 MED ORDER — LISINOPRIL 10 MG PO TABS
5.0000 mg | ORAL_TABLET | Freq: Once | ORAL | Status: AC
  Administered 2014-09-16: 5 mg via ORAL
  Filled 2014-09-16: qty 1

## 2014-09-16 NOTE — ED Provider Notes (Addendum)
CSN: [id number]     Arrival date & time 09/16/14  5366 History   First MD Initiated Contact with Patient 09/16/14 [phone]     Chief Complaint  Patient presents with  . Medication Management     (Consider location/radiation/quality/duration/timing/severity/associated sxs/prior Treatment) The history is provided by the patient and a relative.  Jessica [name] is a 80 y.o. female hx of HTN, dementia, recent admission for CHF here with hypertension, left sided numbness. Just got discharged from rehab yesterday. Woke up this morning with some left sided numbness around 6:30 AM. Denies weakness or trouble speaking. Didn't take her meds this morning because she said that the rehab may have messed up her meds. She has visiting nurse come by this morning to sort out her meds. Denies chest pain, shortness of breath improved.   Level V caveat- dementia    Past Medical History  Diagnosis Date  . Allergy, unspecified not elsewhere classified   . Hypertension   . Palpitations   . Chronic venous insufficiency   . Hypercholesteremia   . GERD (gastroesophageal reflux disease)   . Diverticulosis of colon   . History of urinary tract infection   . Urinary frequency   . Urinary incontinence   . DJD (degenerative joint disease)   . Osteoporosis   . Senile dementia   . Neuropathy   . Anxiety   . History of shingles    Past Surgical History  Procedure Laterality Date  . Vesicovaginal fistula closure w/ tah    . Tonsillectomy    . Abdominal hysterectomy     Family History  Problem Relation Age of Onset  . Transient ischemic attack Father   . Pneumonia Sister    History  Substance Use Topics  . Smoking status: Never Smoker   . Smokeless tobacco: Never Used  . Alcohol Use: No   OB History   Grav Para Term Preterm Abortions TAB SAB Ect Mult Living                 Review of Systems  Neurological: Positive for numbness.  All other systems reviewed and are negative.     Allergies   Review of patient's allergies indicates no known allergies.  Home Medications   Prior to Admission medications   Medication Sig Start Date End Date Taking? Authorizing Provider  ALPRAZolam (XANAX) 0.25 MG tablet Take 0.125-0.25 mg by mouth 3 (three) times daily as needed. For nerves    Historical Provider, MD  aspirin EC 81 MG tablet Take 81 mg by mouth daily.    Historical Provider, MD  clopidogrel (PLAVIX) 75 MG tablet Take 1 tablet (75 mg total) by mouth daily with breakfast. 09/02/14   Brittainy Sherlynn Carbon, PA-C  donepezil (ARICEPT) 5 MG tablet Take 5 mg by mouth 2 (two) times daily.    Historical Provider, MD  feeding supplement (ENSURE COMPLETE) LIQD Take 237 mLs by mouth 2 (two) times daily between meals. 10/15/12   Rhetta Mura, MD  gabapentin (NEURONTIN) 100 MG capsule Take 100 mg by mouth 3 (three) times daily.    Historical Provider, MD  lisinopril (PRINIVIL,ZESTRIL) 5 MG tablet Take 1 tablet (5 mg total) by mouth daily. 09/02/14   Brittainy Sherlynn Carbon, PA-C  metoprolol succinate (TOPROL XL) 25 MG 24 hr tablet Take 0.5 tablets (12.5 mg total) by mouth daily. 09/02/14   Brittainy Sherlynn Carbon, PA-C  Multiple Vitamin (MULTIVITAMIN) tablet Take 1 tablet by mouth daily.      Historical Provider, MD  nitroGLYCERIN (NITROSTAT) 0.4 MG SL tablet Place 1 tablet (0.4 mg total) under the tongue every 5 (five) minutes x 3 doses as needed for chest pain. 09/02/14   Brittainy Sherlynn CarbonM Simmons, PA-C  oxybutynin (DITROPAN) 5 MG tablet Start with 1/2 tablet two times daily 07/20/14   Michele McalpineScott M Nadel, MD  phosphorus (PHOSPHA 250 NEUTRAL) [phone] MG tablet TAKE 1 TABLET BY MOUTH 2 (TWO) TIMES DAILY. 06/15/14   Michele McalpineScott M Nadel, MD   BP 184/67  Pulse 57  Temp(Src) 98.5 F (36.9 C) (Oral)  Resp 18  Ht 5\' 4"  (1.626 m)  Wt 112 lb (50.803 kg)  BMI 19.22 kg/m2  SpO2 94% Physical Exam  Nursing note and vitals reviewed. Constitutional: She is oriented to person, place, and time.  Chronically ill, well appearing  for age   HENT:  Head: Normocephalic.  Mouth/Throat: Oropharynx is clear and moist.  Eyes: Conjunctivae are normal. Pupils are equal, round, and reactive to light.  Neck: Normal range of motion. Neck supple.  Cardiovascular: Normal rate, regular rhythm and normal heart sounds.   Pulmonary/Chest: Effort normal and breath sounds normal. No respiratory distress. She has no wheezes. She has no rales.  Abdominal: Soft. Bowel sounds are normal. She exhibits no distension. There is no tenderness. There is no rebound.  Musculoskeletal: Normal range of motion. She exhibits no tenderness.  L leg swollen (chronic)   Neurological: She is alert and oriented to person, place, and time.  No obvious facial droop. CN intact. Nl sensation throughout. 4/5 motor strength throughout.   Skin: Skin is warm and dry.  Psychiatric: She has a normal mood and affect. Her behavior is normal. Judgment and thought content normal.    ED Course  Procedures (including critical care time) Labs Review Labs Reviewed  CBC WITH DIFFERENTIAL - Abnormal; Notable for the following:    Hemoglobin 11.7 (*)    All other components within normal limits  COMPREHENSIVE METABOLIC PANEL - Abnormal; Notable for the following:    Glucose, Bld 104 (*)    Calcium 10.8 (*)    Albumin 3.2 (*)    ALT 36 (*)    Total Bilirubin 0.2 (*)    GFR calc non Af Amer 44 (*)    GFR calc Af Amer 51 (*)    All other components within normal limits  I-STAT TROPOININ, ED    Imaging Review Ct Head Wo Contrast  09/16/2014   CLINICAL DATA:  Left-sided numbness.  EXAM: CT HEAD WITHOUT CONTRAST  TECHNIQUE: Contiguous axial images were obtained from the base of the skull through the vertex without intravenous contrast.  COMPARISON:  None.  FINDINGS: No intra-axial or extra-axial pathologic fluid or blood collections identified. Diffuse mild atrophy. No mass lesion. No hydrocephalus. No hemorrhage. No acute bony abnormality.  IMPRESSION: No acute  abnormality.   Electronically Signed   By: Maisie Fushomas  Register   On: 09/16/2014 10:15   Mr Brain Wo Contrast  09/16/2014   CLINICAL DATA:  Initial encounter for left-sided numbness following discharge from rehab yesterday.  EXAM: MRI HEAD WITHOUT CONTRAST  TECHNIQUE: Multiplanar, multiecho pulse sequences of the brain and surrounding structures were obtained without intravenous contrast.  COMPARISON:  CT head without contrast 09/16/2014.  FINDINGS: The diffusion-weighted images demonstrate no evidence for acute or subacute infarction. No acute hemorrhage or mass lesion is present. Mild atrophy and white matter changes are within normal limits for age. The ventricles are of normal size. No significant extraaxial fluid collection is present.  Flow  is present in the major intracranial arteries. The patient is status post right lens replacement. The globes and orbits are otherwise intact. The paranasal sinuses and mastoid air cells are clear.  IMPRESSION: Negative MRI of the brain for age.   Electronically Signed   By: Gennette Pac M.D.   On: 09/16/2014 14:38     EKG Interpretation   Date/Time:  Friday September 16 2014 10:15:14 EDT Ventricular Rate:  47 PR Interval:  210 QRS Duration: 134 QT Interval:  642 QTC Calculation: 568 R Axis:   -115 Text Interpretation:  Sinus bradycardia Atrial premature complex RBBB and  LAFB Probable lateral infarct, age indeterminate No significant change  since last tracing Confirmed by Dessirae Scarola  MD, Aletta Edmunds ([id number]) on 09/16/2014  12:11:40 PM      MDM   Final diagnoses:  None    Ovell Adkinson is a 80 y.o. female here with L sided numbness, hypertension. Last normal was last night so outside of TPA window. Unremarkable neuro exam now. Will consult neuro and get CT head, labs. Will give PO BP meds. I doubt dissection or hypertensive emergency.   3:06 PM Dr. Amada Jupiter recommend MRI and if neg, can d/c home. He reviewed her chart and states that she is on plavix and  ASA already and doesn't need further TIA workup. MRI showed no stroke. Has some leg edema but doesn't have SOB so I doubt CHF exacerbation. Off lasix, will give a dose in the ED. Will have her f/u with PMD.   Richardean Canal, MD 09/16/14 1507  Richardean Canal, MD 09/16/14 2035

## 2014-09-16 NOTE — Discharge Instructions (Signed)
Please take your medicines as prescribed.   Follow up with your doctor next week.   Return to ER if you have worse weakness, numbness.

## 2014-09-16 NOTE — ED Notes (Signed)
NAD noted, pt dress, belongings returned to pt. Pt placed in wheelchair. Daughter and pt understood discharge instructions.

## 2014-09-16 NOTE — ED Notes (Signed)
Patient transported to MRI 

## 2014-09-16 NOTE — ED Notes (Signed)
Per [facility], pt released from rehab yesterday and they apparently messed up her medications. Pt here to be seen for her medication management and HTN, pt HAS NOT taken any medications this morning and is on BP meds. Normally takes her medications in the mornings and late afternoon or evening. Pt is A/O x 4. Denies any pain. Did report some tingling in her left leg.

## 2014-09-16 NOTE — ED Notes (Signed)
Dr. Silverio Lay back to speak with pt and son

## 2014-09-19 ENCOUNTER — Telehealth: Payer: Self-pay | Admitting: Pulmonary Disease

## 2014-09-19 NOTE — Telephone Encounter (Signed)
lmtcb x1 for Deb w/ gentiva

## 2014-09-20 ENCOUNTER — Ambulatory Visit (INDEPENDENT_AMBULATORY_CARE_PROVIDER_SITE_OTHER): Payer: Medicare Other | Admitting: Pulmonary Disease

## 2014-09-20 ENCOUNTER — Encounter: Payer: Self-pay | Admitting: Pulmonary Disease

## 2014-09-20 VITALS — BP 92/64 | HR 64 | Temp 97.0°F | Ht 60.0 in | Wt 131.2 lb

## 2014-09-20 DIAGNOSIS — R531 Weakness: Secondary | ICD-10-CM

## 2014-09-20 DIAGNOSIS — I5042 Chronic combined systolic (congestive) and diastolic (congestive) heart failure: Secondary | ICD-10-CM | POA: Insufficient documentation

## 2014-09-20 DIAGNOSIS — F411 Generalized anxiety disorder: Secondary | ICD-10-CM

## 2014-09-20 DIAGNOSIS — M159 Polyosteoarthritis, unspecified: Secondary | ICD-10-CM

## 2014-09-20 DIAGNOSIS — R609 Edema, unspecified: Secondary | ICD-10-CM

## 2014-09-20 DIAGNOSIS — R269 Unspecified abnormalities of gait and mobility: Secondary | ICD-10-CM

## 2014-09-20 DIAGNOSIS — I4510 Unspecified right bundle-branch block: Secondary | ICD-10-CM

## 2014-09-20 DIAGNOSIS — M150 Primary generalized (osteo)arthritis: Secondary | ICD-10-CM

## 2014-09-20 DIAGNOSIS — I872 Venous insufficiency (chronic) (peripheral): Secondary | ICD-10-CM

## 2014-09-20 DIAGNOSIS — G629 Polyneuropathy, unspecified: Secondary | ICD-10-CM

## 2014-09-20 DIAGNOSIS — I214 Non-ST elevation (NSTEMI) myocardial infarction: Secondary | ICD-10-CM

## 2014-09-20 DIAGNOSIS — G459 Transient cerebral ischemic attack, unspecified: Secondary | ICD-10-CM | POA: Insufficient documentation

## 2014-09-20 DIAGNOSIS — M810 Age-related osteoporosis without current pathological fracture: Secondary | ICD-10-CM

## 2014-09-20 DIAGNOSIS — I10 Essential (primary) hypertension: Secondary | ICD-10-CM

## 2014-09-20 DIAGNOSIS — F0390 Unspecified dementia without behavioral disturbance: Secondary | ICD-10-CM

## 2014-09-20 DIAGNOSIS — G458 Other transient cerebral ischemic attacks and related syndromes: Secondary | ICD-10-CM

## 2014-09-20 NOTE — Telephone Encounter (Signed)
lmtcb for [employer]

## 2014-09-20 NOTE — Telephone Encounter (Signed)
Stanton Kidney from [facility] returned call - [phone].

## 2014-09-20 NOTE — Patient Instructions (Signed)
Today we updated your med list in our EPIC system...    Continue your current medications the same...  Your meds were adjusted by the Cardiology Team and you are no longer on the diuretics...    Your feet are again swollen but your BP is too low to restart the fluid pills...    For now>  Keep the salt out of your diet (no salt or sodium), elevate tour legs, and wear support hose regularly...  We will arrange for a Cardiology follow up visit for you...  Call for any questions...  Let's plan a follow up visit in 22month.Marland KitchenMarland Kitchen

## 2014-09-20 NOTE — Telephone Encounter (Signed)
lmtcb

## 2014-09-20 NOTE — Progress Notes (Signed)
Subjective:    Patient ID: Jessica [name], female    DOB: Nov 20, 1911, 80 y.o.   MRN: [id number]  HPI 80 y/o BF here for a follow up visit...  She has mult medical problems including:  AR & runny nose;  Asthmatic Bronchitis;  HBP;  Chronic VI & edema;  Hyperchol;  GERD & esoph stricture;  Divertics;  Hx Urinary incontinence & UTIs;  DJD/ Osteoporosis;  Semile Dementia;  Anxiety...  SEE PREV EPIC NOTES FOR EARLIER DATA >>  ~  December 03, 2012:  92mo ROV & Kimoni's CC= swelling in feet; her wt= 114# up 1# on Demadex20, Diamox250, K10-3/d, and KPhos-Bid; reminded to elim sodium, elev legs, wear support hose...  She is under a lot of stress w/ [place], son helps but works...      We reviewed prob list, meds, xrays and labs> see below for updates >>  LABS 12/13:  Chems- ok w/ K=3.5 Creat=1.1 Ca=10.8 Phos=3.6 PTH=122 (c/w hyperparathyroidism)...  We decided to continue her same meds for now...  ~  January 08, 2013:  21mo ROV & Rogue continues to insist on monthly ROVs- c/o pedal edema about the same as prev, wt stable 114# despite running out of diuretics & we discussed refilling her Demadex20 & Diamox250;  BP stable, O2 sats are 99% on RA, chest clear, etc...  She persists w/ bladder complaints and she is reminded about the double voiding technique per DrTannenbaum & rec for Urology f/u prn...  ~  March 23, 2013:  2-95mo ROV & Willisha is c/o ankle swelling which tends to go down overnight- we discussed stopping her Amlodipine & redoubling efforts at no salt, elevation, support hose, & Demadex;  also c/o fine rash ?generalized w/ pruritis & we discussed Rx w/ Lotrisone cream & Atarax $Remove'25mg'smHsRQQ$  prn... She is once again asking for yet another round of home PT therapy...  We reviewed the following medical problems during today's office visit >>     HBP> on ASA81, Amlod2.5, Lisin5, Demadex20-1/2, Diamox250, & K10-3/d + KPhos-1Bid; BP=100/62 & she is feeling sl weak w/ mult somatic complaints; labs 4/14 show K=3.7,  Phos=3.4, Cr=1.1; we decided to STOP the Amlodipine & work on sodium restriction.    VI, edema> she knows to elim sodium, elev legs, wear support hose, take Demadex $RemoveBefo'20mg'gCbfOoqlioU$ /d...    Chol> on diet alone; she has a very hi HDL ~150!!!    Hypercalcemia> assoc w/ low phos- c/w primary hyperparathyroidism- she is 80y/o & not felt to be an operative candidate; she was started on KPhos 10/13 in [place] on 2Bid w/ improvement in serum Phos to 3.6; serum Ca = 11.2 now but PTH is not rising.    GI- GERD, Divertics> off prev Prilosec20, Miralax, Senakot-S; advised these all as needed...    GU> saw DrTannenbaum for Urology 10/12- mult complaints, urodynamics done w/ sm hypersens bladder unstable w/ leakage, & large bladder divertic> he rec double voiding technique...    DJD, Osteopenia> off Ca supplements they sent her home from hosp w/ Phos-2Bid...    Senile Dementia> on Aricept10 & Alpraz0.25 as needed... We reviewed prob list, meds, xrays and labs> see below for updates >>   LABS 4/14:  Chems- Ca=11.2 but PTH is sl lower than prev at 86;  Phos=3.4 on KPhos1tabBid; K= 3.7 on this & K10- 2AM+1PM;  LFTs= wnl and Alb= 4.0 on Ensure...  ~  May 20, 2013:  12mo ROV & add-on for swelling> Antasia persists w/ c/o swelling in  her legs; ?if she recalls our prev conversation about salt, elevation, support hose; she is taking the Demadex165mQam & Diamox25105mQpm along w/ her KCl & KPhos=> labs today show: K=3.6, BUN=17, Cr=1.0;  Exam shows 3+edema in feet & lower legs, chest clear, heart w/ Gr1/6 SEM w/o rubs or gallops apprec;  We reviewed the need to Elim salt, Elevate the legs, Wear support hose, & we decided to STOP Lisinopril (BP=110/68) and INCREASE the Demadex20 to 2tabsQam...  ROV in 4-6weeks recheck; hopefully she can keep her visiting nurses, home help, etc (she will not go willingly to a NH or AL facility)...     We reviewed prob list, meds, xrays and labs> see below for updates >>   ~  June 23, 2013:  65m259moV &  JulAsmara here w/ her daugh & her husb CecLaveda Abbeast seen 12/11 & asked to f/u in 59mo38moLast OV we increased her Demadex to 20-2Qam + her Diamox250/d along w/ her KCl & KPhos- she is improved having lost 3# down to 110# & less edema; BP is improved as well at 120/62;  Her CC today is some burning discomfort in the bottom of her feet esp when standing &walking; we discussed Neuropathy symptoms & she requests med rx trial- try Gabapentin 100->300mg27m.  We reviewed prob list, meds, xrays and labs> see below for updates >>   Labs 7/14:  Chems- wnl x Ca=10.8, continue same meds...  ~  August 24, 2013:  58mo R558mo Alissa says she was Dx w/ Scabies yest by Derm iPayton MccallumS, we do not have notes; she says she got it from her husb who picked it up while in rehab; her CC is itching & hopefully the Kwell lotion will take care of all this; she states that her neuropathy sx are better on the Gabapentin100Tid; She will turn 89 y/69ext month...  BP remains well controlled on diuretics alone & measures 116/64 today... We reviewed prob list, meds, xrays and labs> see below for updates >>  OK 2014 FLU vaccine today...   ~  October 25, 2013:  58mo RO40moJulia hHolidayrned 80y/o & she framed her letter from [place]; states she's doing fair- same old problems, pain in left shoulder & she wants more PT, but I offered Ortho eval for XRay & poss injection instead...     BP is controlled on ASA81, Demadex20-2Qam, Diamox250, & K10-3/d + KPhos-1Bid (off prev Amlod & Lisin); BP=122/70 & she denies angina pain, palpit, ch in SOB etc; K is sl low at 3.2 7 rec to incr KCl10 to 2Bid...    Serum Calcium id ok at 10.3- no progressive incr & we continue to monitor this parameter...    DJD is treated w/ OTC analgesics and rec to f/u w/ Ortho re: shoulder pain...    She has a senile dementia- on Aricept10 and Alpraz0.25 for prn use... We reviewed prob list, meds, xrays and labs> see below for updates >>   LABS 11/14:  Chems- ok x K=3.2,  BUN=24, Cr=1.1;  CBC w/ Hg=11.8;  TSH=2.48...  ~  March 15, 2014:  38mo ROV365moulia has the same chr complaints- generally stable, notes leaking bladder (prev eval DrTannenbaum), burning feet (saw Podiatry-DrRegal), occas indigestion... She notes that she [facility] to check her ears for lavage...     She notes that her breathing is fine- no recent resp  exac, cough, sputum, ch in SOB, etc...    BP= 106/64 on Demadex20Qam & Diamox250Qpm w/ K10-2Bid and KPhos1Bid; see labs below...    She ambulates w/ cane & walker, denies CP/ palpit/ ch in edema...     She notes some urinary incont & wears depends, no burning etc...    She remains on Aricept5Bid- she prefers it this way & won't change... We reviewed prob list, meds, xrays and labs> see below for updates >> she did not bring med list or med bottles & reminded to bring bottles to every OV...   LABS 1/15:  Chems- ok w/ K=3.5, Cr=1.2  ~  May 20, 2014:  65moROV & Andreal is stable, same chr complaints which she like to discuss Q24mo.     HBP> on ASA81, Demadex20-2Qam, Diamox250-at 4pm, & K10-2Bid + KPhos-1Bid; BP=100/60 & labs 6/15 show K=4.0, Phos=2.5, Cr=1.1; continue same meds for now...    VI, edema> she knows to elim sodium, elev legs, wear support hose, take Demadex & Diamox as directed...    Chol> on diet alone; she has a very hi HDL ~150 when measured in 2012...    Hypercalcemia> assoc w/ low phos- c/w primary hyperparathyroidism- she is 80y/o & not felt to be an operative candidate; we are following Ca, PO4, PTH levels...     GI- GERD, Divertics> off prev Prilosec20, Miralax, Senakot-S; advised these all as needed...    GU> saw DrTannenbaum for Urology 10/12- mult complaints, urodynamics done w/ sm hypersens bladder unstable w/ leakage, & large bladder divertic> he rec double voiding technique...    DJD, Osteopenia> off Ca supplements, on KPhos-2Bid, on MVI & encouraged to  walk as much as poss...    Senile Dementia> on Aricept10 & Alpraz0.25 as needed... We reviewed prob list, meds, xrays and labs> see below for updates >>   LABS 6/15:  Chems- ok x Ca=11.2, PO4=2.5, PTH=108 c/w primary hyperpara;  CBC- Hg=12.3;  TSH=2.09...   ~  July 20, 2014:  4m23moV & JulDaviellemains stable overall- states that she has been doing fairly well but has a list of questions and wants me to re-order visiting nurses to check on her & husb every week or so (I explained [facility] & see what she is eligible for under Medicare)...     1) she wants a nasal spray for drippy nose> try ASTELIN 1-2 sp in each nostril Bid as needed...    2) she is c/o burning discomfort in legs & we reviewed her Neuropathy Dx & the reason for the Gabapentin Rx- she is only taking 100m50md & gets some benefit from this she says; offered to incr dose but she can't remember to take it Tid 7 dosen't want to incr to 2Bid so we decided to keep it the same for now...    3) she is c/o urinary symptoms- leaking, some incont, denies burning/ odor/ pain/ blood etc; we discussed checking UA & C&S; we reviewed prev evals from DrNesi & DrTannenbaum- offered ROV w/ these providers but she declined; finally agreed to try Ditropan5mg-36m2tab bid for her symptoms and let me know if she is better...    4) she wants f/u labs and is due for BMet & Phos level (f/u hyperpara on observation)...  We reviewed prob list, meds, xrays and labs> see below for updates >> she brought med bottles and we reviewed her Rx today...  LABS 8/15:  Chems- ok  x K=3.1 and Cr=1.2;  Phos=2.9;  UA= clear...   ~  September 20, 2014:  66moROV & post hosp visit> JYuriwas Adm by Cards 9/14 - 09/02/14 w/ SOB, she denied CP, EKG was unchanged but Troponins were elev, D-dimer was also abn & a CTAngioChest was done- neg for PE, and VenDopplers were neg for DVT; she was treated w/ Heparin transiently;  2DEcho showed decr LVF w/  EF=45-50% w/ areas of HK, & Gr1DD w/ mild AS/AI- felt to have prob anteroseptal MI w/ distal LAD lesion likely but she refused invasive eval & was made a NCB/ DNR/ med rx only;  Meds were adjusted w/ Hep=> Plavix added, BBlocker continued, and ACE added; [facility]...  In the rehab facility she was managed by the SRoanntheir notes are reviewed...  She was disch home 10/2 w/ home healt PT/ OT/ CNA/ Nursing/ and Social worker all arranged...  After disch she returned to the ER 10/3 w/ paresthesias and weakness> seen by ER staff & Neuro consultant; BP was recorded at 180-190 sys, CT Brain & MRI were neg for acute infarct & she was felt to have had a TIA- already on Plavix & w/ nothing further to offer she was sent home...  She comes in today w/ daugh & appears weaker, BP is low at 92/64, marked increase in leg edema since off her diuretics (stopped during recent hosp w/ her NSTEMI), going downhill but ever stoic she says she is doing satis at home w/ BSC, walker, etc; they truly love the home health care assist w/ Pt/ OT/ HHN/ aides/ & social worker; they again decline to consider NHP for pt & her husband...  We reviewed the following medical problems during today's office visit >>     HBP> prev on Demadex20-2Qam, Diamox250-at 4pm, & K10-2Bid + KPhos-1Bid; but now off all diuretics on MetopER25-1/2 daily, Lisinopril5 & KPhos-1Bid; BP=92/64 & no wiggle room to add back diuretics=> she will f/u w/ Cards soon...     ASHD, s/p NSTEMI, ischemic cardiomyopathy, combined sys & diast CHF> Adm 9/15 by Cards w/ NSTEMI & combined sys/diast CHF; she declined intervention; medical therapy adjusted- off diuretics, added MetopER12.5/ Lisin5/ ASA81/ Plavix75, but edema incr.    VI, edema> off diuretics now per Cards adjustment 9/15; she knows to elim sodium, elev legs, wear support hose; she has f/u appt w/ Cards soon...    Chol> on diet alone; she has a very  hi HDL ~150 when measured in 2012...    Hypercalcemia> assoc w/ low phos- c/w primary hyperparathyroidism- she is 80y/o & not felt to be an operative candidate; we are following Ca, PO4, PTH levels...     GI- GERD, Divertics> off prev Prilosec20, Miralax, Senakot-S; advised these all as needed...    GU> saw DrTannenbaum for Urology 10/12- mult complaints, urodynamics done w/ sm hypersens bladder unstable w/ leakage, & large bladder divertic> he rec double voiding technique & Ditropan5-1/2Bid...    ?TIA, Neuropathy> on Neurontin100Tid; she went to the ER 10/15 one day after disch from the NH w/ numbness=> NEG CTBrain & MRI w/o acute changes, symptoms passed & sent home on same ASA81 + Plavix75/d...    DJD, Osteopenia> off Ca supplements, on KPhos-1Bid, on MVI & encouraged to walk as much as poss...    Senile Dementia> on Aricept5 & Alpraz0.25 as needed... We reviewed prob list, meds, xrays and labs> see  below for updates >>   LABS 10/15:  FLP- ok on diet alone w/ HDL=159!  Chems- ok w/ Cr=1.01, K=4.1, BS=104, A1c=6.0, Ca=10.8;  CBC- Hg=11.7;  TSH=2.48...  CXR 9/15 showed mild cardiomeg, clear lungs, poor inspiration, osteopenia, scoliosis w/o change, NAD.Marland KitchenMarland Kitchen  EKG 9/15 showed SBrady, rate47, PACs, RBBB, left axis deviation, NSSTTWA...   2DEcho 9/15 showed mild LVH, reduced LVF w/ EF=45-50% w/ HK in apex & septum, Gr1DD, mild AS/AI, mildly thickened MV leaflets & mild LAdil, PAsys=40...  CT Angio Chest 9/15 showed neg for PE, mildly enlarged cardiac chambers, Ao looked ok, COPD/E bilat w/ atx vs scarring at bases, scoliosis & DDD, ?nonobstructing stone in mid right kidney...  CT Head 10/15 showed mild diffuse atrophy, no acute changes...  MRI Head 10/15 revealed no infarct, hemorrhage, or mass; mild atrophy & sm vessel dis, NAD...  PLAN>> she has gained 19# since last here w/ marked edema (4+bilat in legs) since she's been off her diuretics; however BP is low at 92/64 on her low dose Metop & Lisin  per Cards; advised no salt/ elev legs/ support hose/ & f/u w/ Cards ASAP as planned (not many options here- perhaps stop BBlocker, add diuretic, consider pacer);  She is happy w/ all the home health support...         Problem List:  Hx of ALLERGIES (ICD-995.3) - she uses OTC meds as needed, & we prev discussed Zyrtek $RemoveBeforeDE'10mg'FdsxKcDKGpKNTOp$ Qam, Saline nasal mist, & Atrovent Nasal 0.03% Tid for drainage (one of her chronic complaints)... ~  8/15: we tries Astelin nasal spray for her chr complaints of dripping...  Hx of ASTHMATIC BRONCHITIS, ACUTE (ICD-466.0) - no recent exacerbations... ~  CXR 5/13 showed borderline heart size, kyphoscoliosis, no edema, and clear lungs... ~  CXR 10/13 showed normal heart size, clear lungs, kyphoscoliosis, osteopenia...  HYPERTENSION (ICD-401.9) >>  ~  controlled on ASA $Remo'81mg'scnhI$ /d, NORVA'5mg'$ -1/2tab, LISINOPRIL5 & DEMADEX $RemoveB'20mg'bCXkgnsX$ /d & DIAMOX$RemoveBe'250mg'PzgIGgzCH$ /d... ~  4/12:  BP= 110/60 and she states that she is taking her meds regularly & tolerating them well... denies HA, visual changes, CP, palpit, change in dyspnea, etc... but notes weakness, intermittently dizzy & persist edema in her feet...  ~  6/12:  BP= 124/70 & she remains stable... ~  8/12:  BP= 110/ 66 & stable... ~  10/12:  BP= 114/62 & stable... ~  1/13:  BP= 110/64 & she remains stable... ~  4/13:  BP= 116/58 & she is reminded to adjust Lasix 1-2 Qam according to her edema... ~  5/13:  BP= 140/72 & she is c/o pedal edema; we decided to change to Demadex $RemoveBe'20mg'QpUNtcNBc$ - 2Qam. ~  7/13:  BP= 110/60 & her edema is diminished, still 1+ in feet... Labs showed HCO3=39 & DIAMOX $Remove'250mg'pYgChQp$ /d added. ~  8/13:  BP= 118/74 & edema is the same; ?what she's taking? & we decided to simplify regimen> Norvasc1/2, Lisin1/2, Demadex-1/d & Diamox-1/d... ~  9/13:  BP= 110/70 & she is clinically stable on these meds; add K10Bid for the K=3.4 today... ~  EKG 10/13 showed NSR, rate63, RBBB, poss old infarct anterolaterally... ~  11/13:  on ASA81, Amlod2.5, Lisin5,  Demadex20-1/2, Diamox250, & K20/d; BP=122/64 & she is feeling better, still w/ mult somatic complaints... ~  12/13:  BP= 124/72 & her edema, weight, etc are about stable... ~  1/14:  BP= 118/60 & she persists w/ mult somatic complaints... ~  4/14:  on ASA81, Amlod2.5, Lisin5, Demadex20-1/2, Diamox250, & K10-3/d + KPhos-1Bid; BP=100/62 & she is feeling sl weak w/  mult somatic complaints; labs 4/14 show K=3.7, Phos=3.4, Cr=1.1; we decided to STOP the Amlodipine & work on sodium restriction ~  6/14: on ASA81, Lisiin5, Demadex20, Diamox250, & K10-3/d + KPhos-1Bid; BP= 110/68 & 3+edema persists; Labs- OK & we decided to STOP the Lisinopril & INCR the Demadex20-2Qam.... ~  7/14: on  ASA81, Demadex20-2/d, Diamox250, & K10-3/d + KPhos-1Bid; BP= 120/62 & she is improved w/ decr edema... ~  9/14:  BP remains well controlled on diuretics alone & measures 116/64 today.. ~  11/14: BP is controlled on ASA81, Demadex20-2Qam, Diamox250, & K10-3/d + KPhos-1Bid (off prev Amlod & Lisin); BP=122/70 & she denies angina pain, palpit, ch in SOB etc; K is sl low at 3.2 7 rec to incr KCl10 to 2Bid. ~  3/15: BP= 106/64 on Demadex20Qam & Diamox250Qpm w/ K10-2Bid and KPhos1Bid... ~  6/15:  on ASA81, Demadex20-2Qam, Diamox250-at 4pm, & K10-2Bid + KPhos-1Bid; BP=100/60 & labs 6/15 show K=4.0, Phos=2.5, Cr=1.1; continue same meds for now.  VENOUS INSUFFICIENCY, CHRONIC (ICD-459.81) - there is mild pedal edema bilat L>R... on low sodium diet, elevation, support hose- prev refused diuretic meds due to urinary symptoms, now on [facility] as well...  HYPERCHOLESTEROLEMIA (ICD-272.0) - prev on Lip10, but she stopped this on her own... she has a very high HDL!!! ~  FLP was 1/08 showed TChol 253, TG 125, HDL 121, LDL 93 ~  FLP 2/09 showed TChol 200, TG 99, HDL 136, LDL 44 ~  FLP 4/11 showed TChol 244, TG 143, HDL 149, LDL 59 ~  FLP 1/12 showed TChol 266, TG 102, HDL 151, LDL 83 >> BEST HDL I'VE EVER SEEN ~  It has been hard to get her  back in for FASTING labs... ~  [place] 9/15 in [place] on diet alone showed TChol 222, TG 61, HDL 159, LDL 51  HYPERCALCEMIA >> routine labs w/ Calcium levels betw 10.2 & 11.7 over the last 26yrs... ~  Labs 7/13 showed Ca= 11.8 &  PTH= 81... For now avoid calc supplements etc... ~  Labs 9/13 showed Ca= 11.0 & she is reminded- no calcium, no VitD... ~  Labs 10/13 Hosp reviewed> Ca was up, Phos was low, K was low> all improved w/ Rx in hosp (they did not repeat PTH level). ~  4/14:   assoc w/ low phos- c/w primary hyperparathyroidism- she is 80y/o & not felt to be an operative candidate; she was started on KPhos 10/13 in [place] on 2Bid w/ improvement in serum Phos to 3.6; serum Ca = 11.2 now but PTH is not rising.  ~  Labs 11/14 showed Ca=10.3 ~  Labs 1/15 showed Ca= 10.5 ~  6/15:  Labs showed  Ca=11.2, PO4=2.5, PTH=108 c/w primary hyperparathyroidism; we are following... ~  Labs 8/15 improved w/ Ca= 10.4, Phos= 2.9  GERD (ICD-530.81) - last EGD was 9/05 by DrPerry showing GERD and esoph stricture- dilated... she stopped her Nexium but states that her swallowing is good & she uses PRILOSEC OTC as needed.  DIVERTICULOSIS OF COLON (ICD-562.10) - last colonoscopy was 11/00 showing divertics only... ~  8/12:  She notes some constipation & rec to take [place] per our usual protocol...  HX, URINARY INFECTION (ICD-V13.02) & INCONTINENCE symptoms... she has seen DrTannenbaum & staff on bladder exercises and she reported some improvement but has persistant symptoms & never followed up... ~  7/10:  offered f/u appt w/ Urology vs second opinion consult but she declines... ~  9/10:  wrote for trial Enablex  7.'5mg'$ /d >> no benefit she says. ~  12/10: saw DrNesi- tried sample med, sl improved, but "he turned me over to a lady for longer term treatments" she says... ~  4/12 & 6/12: states DrNesi hasn't been able to help her & she wants appt w/ DrTannenbaum==> seen w/ urodynamic eval revealing a sm  hypersens bladder & a large bladder divertic; not a surg candidate, taught double voiding technique... ~  10/12: she had f/u DrTannenbaum & he reinforced prev w/u & need for double voiding technique... ~  10/13: In hosp Urine grew mult species, Rx w/ Roceph=> Cipro...  DEGENERATIVE JOINT DISEASE (ICD-715.90) - notes right shoulder symptoms, but managing OK w/ Mobic 7.$RemoveBe'5mg'vloTrcrUA$  & Tylenol; also right hip & low back pain...  she also c/o neuropathic "burning" in legs but not bad enough for additional meds she says... ~  8/14: we have a note from [place] (7 page note reviewed)> pt c/o bilat foot & ankle pain & burning w/ numbness & tingling; Dx w/ somatic dysfunction in lumbar region; subluxations seen on XRays; given an adjustment... ~  9/14: symptoms improved on Gabapentin $RemoveBefor'100mg'YpQqSvbScjxT$  Tid she says...  OSTEOPOROSIS (ICD-733.00) - she was on Fosamax but had discontinued this on her own... we discussed the need for continued therapy and she was asked to restart Alendronate but she never did...  she takes Ca++, MVI, VitD==> rec to STOP the Calcium.  SENILE DEMENTIA (ICD-290.0) - she takes ASA $RemoveB'81mg'dBboopax$ /d... she tried Aricept in the past but didn't notice any improvement therefore stopped it. ~  she & her husb still live on their own but are having numerous difficulties while still resisting any thoughts of assisted living etc> got concerned because she saw an impulse in her left antecubital fossa (tortuous brachial art), went to the ER & this got translated to palpitations & lead to full ER cardiac eval= neg... ~  6/11: MMSE showed 26/30 & she agreed to try DONEPEZIL $RemoveBefor'10mg'XLAtayCyksYl$ /d... ~  6/12:  Still taking Aricept & appears stable... ~  7/13:  No change, same meds, clinically stable... ~  7/14:  She remains stable and will be 80 y/o in Oct... ~  3/15:  She remains on Aricept5Bid- she prefers it this way & won't change...  ANXIETY (ICD-300.00) - she uses Alpraz 0.$RemoveBefor'25mg'ZMwSixKgxspW$  as needed.  Hx of SHINGLES  (ICD-053.9)   Past Surgical History  Procedure Laterality Date  . Vesicovaginal fistula closure w/ tah    . Tonsillectomy    . Abdominal hysterectomy      Outpatient Encounter Prescriptions as of 09/20/2014  Medication Sig  . ALPRAZolam (XANAX) 0.25 MG tablet Take 0.125-0.25 mg by mouth 3 (three) times daily as needed. For nerves  . aspirin EC 81 MG tablet Take 81 mg by mouth daily.  . clopidogrel (PLAVIX) 75 MG tablet Take 1 tablet (75 mg total) by mouth daily with breakfast.  . donepezil (ARICEPT) 5 MG tablet Take 5 mg by mouth 2 (two) times daily.  . feeding supplement (ENSURE COMPLETE) LIQD Take 237 mLs by mouth 2 (two) times daily between meals.  . gabapentin (NEURONTIN) 100 MG capsule Take 100 mg by mouth 3 (three) times daily.  Marland Kitchen lisinopril (PRINIVIL,ZESTRIL) 5 MG tablet Take 1 tablet (5 mg total) by mouth daily.  . metoprolol succinate (TOPROL XL) 25 MG 24 hr tablet Take 0.5 tablets (12.5 mg total) by mouth daily.  . Multiple Vitamin (MULTIVITAMIN) tablet Take 1 tablet by mouth daily.    . nitroGLYCERIN (NITROSTAT) 0.4 MG SL  tablet Place 1 tablet (0.4 mg total) under the tongue every 5 (five) minutes x 3 doses as needed for chest pain.  Marland Kitchen oxybutynin (DITROPAN) 5 MG tablet Start with 1/2 tablet two times daily  . phosphorus (PHOSPHA 250 NEUTRAL) 155-852-130 MG tablet TAKE 1 TABLET BY MOUTH 2 (TWO) TIMES DAILY.    No Known Allergies   Current Medications, Allergies, Past Medical History, Past Surgical History, Family History, and Social History were reviewed in [employer] record.    Review of Systems        See HPI - all other systems neg except as noted...      The patient complains of decreased hearing, dyspnea on exertion, peripheral edema, incontinence, muscle weakness, and difficulty walking.  The patient denies anorexia, fever, weight loss, weight gain, vision loss, hoarseness, chest pain, syncope, prolonged cough, headaches, hemoptysis, abdominal  pain, melena, hematochezia, severe indigestion/heartburn, hematuria, suspicious skin lesions, transient blindness, depression, unusual weight change, abnormal bleeding, enlarged lymph nodes, and angioedema.     Objective:   Physical Exam      WD, WN, 80 y/o BF in NAD... GENERAL:  Alert, pleasant & cooperative; she is slow moving as noted... HEENT:  [place]/AT, EOM-full, EACs- some wax, TMs-wnl, NOSE-clear, THROAT-clear & wnl. NECK:  Supple w/ fairROM; no JVD; normal carotid impulses w/o bruits; no thyromegaly or nodules palpated; no lymphadenopathy. CHEST:  Clear to P & A; without wheezes/ rales/ or rhonchi., noted kyphosis... HEART:  Regular Rhythm; Gr1/6 SEM, without rubs or gallops heard... ABDOMEN:  Soft & nontender; normal bowel sounds; no organomegaly or masses detected. EXT:  mod arthritic changes; no varicose veins/ +venous insuffic/ 3+edema now deficits... DERM:  No lesions noted; no rash etc...  MMSE> 06/13/10> Score 26/30 (missed place, 2/3 recall after distraction, & copy figure)...   Assessment & Plan:  ASHD, s/p NSTEMI, combined sys/diast CHF, 4+edema>  [facility] she declined invasive options, treated medically, but going downhill w/ hypotension and marked incr edema, she will f/u w/ Cards regarding options avail...  AR & ASTHMA>  Breathing is stable, at baseline, she has chr rhinorrhea complaints w/ numerous discussions regarding management of this problem....  Ven Insuffic/ Edema>>  We have had mult conversations at each OV regarding no salt, elevation, support hose; she is now off the Demadex, Diamox w/ marked incr in edema=> has f/u w/ Cards soon to see if meds can be adjusted further & diuretic restarted.  CHOL>  She has the world's best HDL!!!  Electrolye Abn>  See 10/13 [facility]osp [facility], it is apparent that her elev calcium is the result of HYPERPARATHYROIDISM but at age 89 we are reluctant to refer for surg unless progressive... We are following her  serum Calcium level & non-progressive. Hypercalcemia>  Surely from Hyperpara & she has stopped the calcium supplements, & we are following the labs...  GU>  She saw DrTannenbaum w/ extensive investigation into her voiding symptoms & urodynamics as above; rec to use "double voiding" technique... 8/15> still complaining & declines ret to Urology; try Ditropan $RemoveBefo'5mg'IWKkrzlAYKP$ - 1/2 tab Bid...  DJD/ Osteopenia>  Aware, she is stable on OTC meds, she has repeatedly refuse Bisphos meds for her bones...  TIA, Dementia>  On ASA81, Plavix75; She & husb Laveda Abbe are still living independently w/ daugh help but they are very fragile yet won't consider NHP...  Other medical problems as noted...   Patient's Medications  New Prescriptions   No medications on file  Previous Medications  ALPRAZOLAM (XANAX) 0.25 MG TABLET    Take 0.125-0.25 mg by mouth 3 (three) times daily as needed. For nerves   ASPIRIN EC 81 MG TABLET    Take 81 mg by mouth daily.   CLOPIDOGREL (PLAVIX) 75 MG TABLET    Take 1 tablet (75 mg total) by mouth daily with breakfast.   DONEPEZIL (ARICEPT) 5 MG TABLET    Take 5 mg by mouth 2 (two) times daily.   FEEDING SUPPLEMENT (ENSURE COMPLETE) LIQD    Take 237 mLs by mouth 2 (two) times daily between meals.   GABAPENTIN (NEURONTIN) 100 MG CAPSULE    Take 100 mg by mouth 3 (three) times daily.   LISINOPRIL (PRINIVIL,ZESTRIL) 5 MG TABLET    Take 1 tablet (5 mg total) by mouth daily.   METOPROLOL SUCCINATE (TOPROL XL) 25 MG 24 HR TABLET    Take 0.5 tablets (12.5 mg total) by mouth daily.   MULTIPLE VITAMIN (MULTIVITAMIN) TABLET    Take 1 tablet by mouth daily.     NITROGLYCERIN (NITROSTAT) 0.4 MG SL TABLET    Place 1 tablet (0.4 mg total) under the tongue every 5 (five) minutes x 3 doses as needed for chest pain.   OXYBUTYNIN (DITROPAN) 5 MG TABLET    Start with 1/2 tablet two times daily   PHOSPHORUS (PHOSPHA 250 NEUTRAL) 155-852-130 MG TABLET    TAKE 1 TABLET BY MOUTH 2 (TWO) TIMES DAILY.  Modified  Medications   No medications on file  Discontinued Medications   No medications on file

## 2014-09-21 ENCOUNTER — Telehealth: Payer: Self-pay | Admitting: Pulmonary Disease

## 2014-09-21 NOTE — Telephone Encounter (Signed)
Called spoke with Deb. Gave VO. Nothing further needed

## 2014-09-21 NOTE — Telephone Encounter (Signed)
Per SN---  Yes for the home health services.  thanks

## 2014-09-21 NOTE — Telephone Encounter (Signed)
Called and spoke with cynthia and advised her that the pt did not receive her flu shot at her appt yesterday.  She will bring the pt in tomorrow afternoon for this.  She has been placed on the injection schedule.

## 2014-09-21 NOTE — Telephone Encounter (Signed)
Called and spoke to [place]. Deb was requesting Dr. Jodelle Green approval for home health services to go out to home for 3 times this week, then 2 times for 3 weeks, then 1 time for 5 weeks for cardiac education and medication management. Pt had nonstemi in 08/2014 and was seen by SN on 09/20/2014.   SN please advise if ok for home health service.  No Known Allergies  Current Outpatient Prescriptions on File Prior to Visit  Medication Sig Dispense Refill  . ALPRAZolam (XANAX) 0.25 MG tablet Take 0.125-0.25 mg by mouth 3 (three) times daily as needed. For nerves      . aspirin EC 81 MG tablet Take 81 mg by mouth daily.      . clopidogrel (PLAVIX) 75 MG tablet Take 1 tablet (75 mg total) by mouth daily with breakfast.  30 tablet  5  . donepezil (ARICEPT) 5 MG tablet Take 5 mg by mouth 2 (two) times daily.      . feeding supplement (ENSURE COMPLETE) LIQD Take 237 mLs by mouth 2 (two) times daily between meals.  60 Bottle  0  . gabapentin (NEURONTIN) 100 MG capsule Take 100 mg by mouth 3 (three) times daily.      Marland Kitchen lisinopril (PRINIVIL,ZESTRIL) 5 MG tablet Take 1 tablet (5 mg total) by mouth daily.  30 tablet  5  . metoprolol succinate (TOPROL XL) 25 MG 24 hr tablet Take 0.5 tablets (12.5 mg total) by mouth daily.  30 tablet  3  . Multiple Vitamin (MULTIVITAMIN) tablet Take 1 tablet by mouth daily.        . nitroGLYCERIN (NITROSTAT) 0.4 MG SL tablet Place 1 tablet (0.4 mg total) under the tongue every 5 (five) minutes x 3 doses as needed for chest pain.  25 tablet  1  . oxybutynin (DITROPAN) 5 MG tablet Start with 1/2 tablet two times daily  60 tablet  6  . phosphorus (PHOSPHA 250 NEUTRAL) 155-852-130 MG tablet TAKE 1 TABLET BY MOUTH 2 (TWO) TIMES DAILY.  180 tablet  1   No current facility-administered medications on file prior to visit.

## 2014-09-22 ENCOUNTER — Telehealth: Payer: Self-pay | Admitting: Pulmonary Disease

## 2014-09-22 ENCOUNTER — Ambulatory Visit: Payer: Medicare Other

## 2014-09-22 NOTE — Telephone Encounter (Signed)
Called [place]. Gave VO. Nothing further needed

## 2014-09-23 ENCOUNTER — Emergency Department (HOSPITAL_COMMUNITY): Payer: Medicare Other

## 2014-09-23 ENCOUNTER — Inpatient Hospital Stay (HOSPITAL_COMMUNITY)
Admission: EM | Admit: 2014-09-23 | Discharge: 2014-09-29 | DRG: 292 | Disposition: A | Discharge status: Skilled Nursing Facility | Payer: Medicare Other | Attending: Internal Medicine | Admitting: Internal Medicine

## 2014-09-23 ENCOUNTER — Encounter (HOSPITAL_COMMUNITY): Payer: Self-pay | Admitting: Emergency Medicine

## 2014-09-23 DIAGNOSIS — E870 Hyperosmolality and hypernatremia: Secondary | ICD-10-CM | POA: Diagnosis not present

## 2014-09-23 DIAGNOSIS — E7800 Pure hypercholesterolemia, unspecified: Secondary | ICD-10-CM | POA: Diagnosis present

## 2014-09-23 DIAGNOSIS — I872 Venous insufficiency (chronic) (peripheral): Secondary | ICD-10-CM | POA: Diagnosis present

## 2014-09-23 DIAGNOSIS — I69991 Dysphagia following unspecified cerebrovascular disease: Secondary | ICD-10-CM | POA: Diagnosis not present

## 2014-09-23 DIAGNOSIS — K219 Gastro-esophageal reflux disease without esophagitis: Secondary | ICD-10-CM | POA: Diagnosis present

## 2014-09-23 DIAGNOSIS — N183 Chronic kidney disease, stage 3 unspecified: Secondary | ICD-10-CM | POA: Diagnosis present

## 2014-09-23 DIAGNOSIS — R0602 Shortness of breath: Secondary | ICD-10-CM | POA: Diagnosis present

## 2014-09-23 DIAGNOSIS — Z66 Do not resuscitate: Secondary | ICD-10-CM | POA: Diagnosis present

## 2014-09-23 DIAGNOSIS — I10 Essential (primary) hypertension: Secondary | ICD-10-CM | POA: Diagnosis present

## 2014-09-23 DIAGNOSIS — H01006 Unspecified blepharitis left eye, unspecified eyelid: Secondary | ICD-10-CM

## 2014-09-23 DIAGNOSIS — Z7982 Long term (current) use of aspirin: Secondary | ICD-10-CM

## 2014-09-23 DIAGNOSIS — Z515 Encounter for palliative care: Secondary | ICD-10-CM | POA: Diagnosis not present

## 2014-09-23 DIAGNOSIS — I5043 Acute on chronic combined systolic (congestive) and diastolic (congestive) heart failure: Principal | ICD-10-CM | POA: Diagnosis present

## 2014-09-23 DIAGNOSIS — I5042 Chronic combined systolic (congestive) and diastolic (congestive) heart failure: Secondary | ICD-10-CM

## 2014-09-23 DIAGNOSIS — R1312 Dysphagia, oropharyngeal phase: Secondary | ICD-10-CM | POA: Diagnosis present

## 2014-09-23 DIAGNOSIS — F419 Anxiety disorder, unspecified: Secondary | ICD-10-CM | POA: Diagnosis present

## 2014-09-23 DIAGNOSIS — G459 Transient cerebral ischemic attack, unspecified: Secondary | ICD-10-CM | POA: Diagnosis present

## 2014-09-23 DIAGNOSIS — M6281 Muscle weakness (generalized): Secondary | ICD-10-CM | POA: Diagnosis not present

## 2014-09-23 DIAGNOSIS — Z7902 Long term (current) use of antithrombotics/antiplatelets: Secondary | ICD-10-CM | POA: Diagnosis not present

## 2014-09-23 DIAGNOSIS — G629 Polyneuropathy, unspecified: Secondary | ICD-10-CM | POA: Diagnosis present

## 2014-09-23 DIAGNOSIS — Z79899 Other long term (current) drug therapy: Secondary | ICD-10-CM

## 2014-09-23 DIAGNOSIS — I129 Hypertensive chronic kidney disease with stage 1 through stage 4 chronic kidney disease, or unspecified chronic kidney disease: Secondary | ICD-10-CM | POA: Diagnosis present

## 2014-09-23 DIAGNOSIS — I2510 Atherosclerotic heart disease of native coronary artery without angina pectoris: Secondary | ICD-10-CM | POA: Diagnosis present

## 2014-09-23 DIAGNOSIS — M810 Age-related osteoporosis without current pathological fracture: Secondary | ICD-10-CM | POA: Diagnosis present

## 2014-09-23 DIAGNOSIS — G603 Idiopathic progressive neuropathy: Secondary | ICD-10-CM | POA: Diagnosis not present

## 2014-09-23 DIAGNOSIS — Z8673 Personal history of transient ischemic attack (TIA), and cerebral infarction without residual deficits: Secondary | ICD-10-CM | POA: Diagnosis not present

## 2014-09-23 DIAGNOSIS — R262 Difficulty in walking, not elsewhere classified: Secondary | ICD-10-CM | POA: Diagnosis not present

## 2014-09-23 DIAGNOSIS — I509 Heart failure, unspecified: Secondary | ICD-10-CM

## 2014-09-23 DIAGNOSIS — E785 Hyperlipidemia, unspecified: Secondary | ICD-10-CM | POA: Diagnosis present

## 2014-09-23 DIAGNOSIS — R001 Bradycardia, unspecified: Secondary | ICD-10-CM | POA: Diagnosis present

## 2014-09-23 DIAGNOSIS — I252 Old myocardial infarction: Secondary | ICD-10-CM | POA: Diagnosis not present

## 2014-09-23 DIAGNOSIS — E876 Hypokalemia: Secondary | ICD-10-CM | POA: Diagnosis not present

## 2014-09-23 DIAGNOSIS — H01003 Unspecified blepharitis right eye, unspecified eyelid: Secondary | ICD-10-CM

## 2014-09-23 DIAGNOSIS — G309 Alzheimer's disease, unspecified: Secondary | ICD-10-CM | POA: Diagnosis not present

## 2014-09-23 DIAGNOSIS — K2100 Gastro-esophageal reflux disease with esophagitis, without bleeding: Secondary | ICD-10-CM

## 2014-09-23 HISTORY — DX: Cardiac murmur, unspecified: R01.1

## 2014-09-23 HISTORY — DX: Cerebral infarction, unspecified: I63.9

## 2014-09-23 LAB — CBC WITH DIFFERENTIAL/PLATELET
Basophils Absolute: 0 10*3/uL (ref 0.0–0.1)
Basophils Relative: 0 % (ref 0–1)
Eosinophils Absolute: 0 10*3/uL (ref 0.0–0.7)
Eosinophils Relative: 1 % (ref 0–5)
HCT: 40.6 % (ref 36.0–46.0)
Hemoglobin: 12.8 g/dL (ref 12.0–15.0)
Lymphocytes Relative: 9 % — ABNORMAL LOW (ref 12–46)
Lymphs Abs: 0.5 10*3/uL — ABNORMAL LOW (ref 0.7–4.0)
MCH: 28.3 pg (ref 26.0–34.0)
MCHC: 31.5 g/dL (ref 30.0–36.0)
MCV: 89.8 fL (ref 78.0–100.0)
Monocytes Absolute: 0.5 10*3/uL (ref 0.1–1.0)
Monocytes Relative: 8 % (ref 3–12)
Neutro Abs: 5.1 10*3/uL (ref 1.7–7.7)
Neutrophils Relative %: 82 % — ABNORMAL HIGH (ref 43–77)
Platelets: 311 10*3/uL (ref 150–400)
RBC: 4.52 MIL/uL (ref 3.87–5.11)
RDW: 14.9 % (ref 11.5–15.5)
WBC: 6.2 10*3/uL (ref 4.0–10.5)

## 2014-09-23 LAB — BASIC METABOLIC PANEL
Anion gap: 6 (ref 5–15)
BUN: 15 mg/dL (ref 6–23)
CO2: 34 mEq/L — ABNORMAL HIGH (ref 19–32)
Calcium: 10.2 mg/dL (ref 8.4–10.5)
Chloride: 102 mEq/L (ref 96–112)
Creatinine, Ser: 1.05 mg/dL (ref 0.50–1.10)
GFR calc Af Amer: 49 mL/min — ABNORMAL LOW (ref >90)
GFR calc non Af Amer: 42 mL/min — ABNORMAL LOW (ref >90)
Glucose, Bld: 99 mg/dL (ref 70–99)
Potassium: 4.6 mEq/L (ref 3.7–5.3)
Sodium: 142 mEq/L (ref 137–147)

## 2014-09-23 LAB — URINALYSIS, ROUTINE W REFLEX MICROSCOPIC
Bilirubin Urine: NEGATIVE
Glucose, UA: NEGATIVE mg/dL
Hgb urine dipstick: NEGATIVE
Ketones, ur: NEGATIVE mg/dL
Leukocytes, UA: NEGATIVE
Nitrite: NEGATIVE
Protein, ur: 30 mg/dL — AB
Specific Gravity, Urine: 1.018 (ref 1.005–1.030)
Urobilinogen, UA: 0.2 mg/dL (ref 0.0–1.0)
pH: 6 (ref 5.0–8.0)

## 2014-09-23 LAB — URINE MICROSCOPIC-ADD ON

## 2014-09-23 LAB — PRO B NATRIURETIC PEPTIDE: Pro B Natriuretic peptide (BNP): 9280 pg/mL — ABNORMAL HIGH (ref 0–450)

## 2014-09-23 LAB — I-STAT TROPONIN, ED: Troponin i, poc: 0.05 ng/mL (ref 0.00–0.08)

## 2014-09-23 LAB — TROPONIN I: Troponin I: <0.3 ng/mL (ref <0.30)

## 2014-09-23 MED ORDER — SODIUM CHLORIDE 0.9 % IV SOLN: 250.0000 mL | INTRAVENOUS | Status: DC | PRN

## 2014-09-23 MED ORDER — ENSURE COMPLETE PO LIQD
237.0000 mL | Freq: Two times a day (BID) | ORAL | Status: DC
  Administered 2014-09-25 – 2014-09-29 (×9): 237 mL via ORAL

## 2014-09-23 MED ORDER — SODIUM CHLORIDE 0.9 % IJ SOLN
3.0000 mL | Freq: Two times a day (BID) | INTRAMUSCULAR | Status: DC
  Administered 2014-09-25 – 2014-09-29 (×5): 3 mL via INTRAVENOUS

## 2014-09-23 MED ORDER — GABAPENTIN 100 MG PO CAPS
100.0000 mg | ORAL_CAPSULE | Freq: Three times a day (TID) | ORAL | Status: DC
  Administered 2014-09-23 – 2014-09-29 (×16): 100 mg via ORAL
  Filled 2014-09-23 (×19): qty 1

## 2014-09-23 MED ORDER — SODIUM CHLORIDE 0.9 % IJ SOLN: 3.0000 mL | INTRAMUSCULAR | Status: DC | PRN

## 2014-09-23 MED ORDER — NITROGLYCERIN 0.4 MG SL SUBL: 0.4000 mg | SUBLINGUAL_TABLET | SUBLINGUAL | Status: DC | PRN

## 2014-09-23 MED ORDER — DONEPEZIL HCL 5 MG PO TABS
5.0000 mg | ORAL_TABLET | Freq: Two times a day (BID) | ORAL | Status: DC
  Administered 2014-09-23: 5 mg via ORAL
  Filled 2014-09-23 (×3): qty 1

## 2014-09-23 MED ORDER — FUROSEMIDE 10 MG/ML IJ SOLN
40.0000 mg | Freq: Once | INTRAMUSCULAR | Status: AC
  Administered 2014-09-23: 40 mg via INTRAVENOUS
  Filled 2014-09-23: qty 4

## 2014-09-23 MED ORDER — FUROSEMIDE 10 MG/ML IJ SOLN
40.0000 mg | Freq: Every day | INTRAMUSCULAR | Status: DC
  Administered 2014-09-23 – 2014-09-25 (×3): 40 mg via INTRAVENOUS
  Filled 2014-09-23 (×3): qty 4

## 2014-09-23 MED ORDER — ASPIRIN EC 81 MG PO TBEC
81.0000 mg | DELAYED_RELEASE_TABLET | Freq: Every day | ORAL | Status: DC
  Administered 2014-09-25: 81 mg via ORAL
  Filled 2014-09-23 (×4): qty 1

## 2014-09-23 MED ORDER — K PHOS MONO-SOD PHOS DI & MONO 155-852-130 MG PO TABS
250.0000 mg | ORAL_TABLET | Freq: Two times a day (BID) | ORAL | Status: DC
  Administered 2014-09-23 – 2014-09-29 (×10): 250 mg via ORAL
  Filled 2014-09-23 (×14): qty 1

## 2014-09-23 MED ORDER — ALPRAZOLAM 0.25 MG PO TABS: 0.1250 mg | ORAL_TABLET | Freq: Three times a day (TID) | ORAL | Status: DC | PRN

## 2014-09-23 MED ORDER — ADULT MULTIVITAMIN W/MINERALS CH
1.0000 | ORAL_TABLET | Freq: Every day | ORAL | Status: DC
  Administered 2014-09-25 – 2014-09-29 (×5): 1 via ORAL
  Filled 2014-09-23 (×6): qty 1

## 2014-09-23 MED ORDER — SODIUM CHLORIDE 0.9 % IJ SOLN
3.0000 mL | Freq: Two times a day (BID) | INTRAMUSCULAR | Status: DC
  Administered 2014-09-23 – 2014-09-29 (×11): 3 mL via INTRAVENOUS

## 2014-09-23 MED ORDER — HEPARIN SODIUM (PORCINE) 5000 UNIT/ML IJ SOLN
5000.0000 [IU] | Freq: Three times a day (TID) | INTRAMUSCULAR | Status: DC
  Administered 2014-09-23 – 2014-09-29 (×17): 5000 [IU] via SUBCUTANEOUS
  Filled 2014-09-23 (×20): qty 1

## 2014-09-23 MED ORDER — NITROGLYCERIN 2 % TD OINT
1.0000 [in_us] | TOPICAL_OINTMENT | Freq: Once | TRANSDERMAL | Status: AC
Start: 2014-09-23 — End: 2014-09-23
  Administered 2014-09-23: 1 [in_us] via TOPICAL
  Filled 2014-09-23: qty 1

## 2014-09-23 MED ORDER — LISINOPRIL 5 MG PO TABS
5.0000 mg | ORAL_TABLET | Freq: Every day | ORAL | Status: DC
  Administered 2014-09-25 – 2014-09-29 (×5): 5 mg via ORAL
  Filled 2014-09-23 (×6): qty 1

## 2014-09-23 MED ORDER — INFLUENZA VAC SPLIT QUAD 0.5 ML IM SUSY
0.5000 mL | PREFILLED_SYRINGE | INTRAMUSCULAR | Status: DC
  Filled 2014-09-23: qty 0.5

## 2014-09-23 MED ORDER — CLOPIDOGREL BISULFATE 75 MG PO TABS
75.0000 mg | ORAL_TABLET | Freq: Every day | ORAL | Status: DC
  Administered 2014-09-25 – 2014-09-29 (×5): 75 mg via ORAL
  Filled 2014-09-23 (×9): qty 1

## 2014-09-23 MED ORDER — OXYBUTYNIN CHLORIDE 5 MG PO TABS
2.5000 mg | ORAL_TABLET | Freq: Two times a day (BID) | ORAL | Status: DC
  Administered 2014-09-23 – 2014-09-29 (×11): 2.5 mg via ORAL
  Filled 2014-09-23 (×13): qty 0.5

## 2014-09-23 NOTE — ED Notes (Signed)
Pt from home via [facility] with c/o shortness of breath starting earlier today.  Fire reported intial SpO2 of 84% on RA, placed on 4 L nasal cannula and at 98% when EMS arrived.  Pt was able to take several steps to the stretcher, a&o x4.

## 2014-09-23 NOTE — ED Notes (Signed)
Pt SpO2 decreased to 88% on RA. Placed pt on 2 L nasal cannula.

## 2014-09-23 NOTE — H&P (Addendum)
[facility] History and Physical  Jessica [name] WUJ:[id number] DOB: 12-26-1910 DOA: 09/23/2014  Referring physician: ED physician PCP: [name], MD  Specialists:   Chief Complaint: SOB  HPI: Jessica [name] is a 80 y.o. female with PMH of hypertension, CHF, hx of STEMI,hyperlipidemia, GERD, who presents with SOB  Patient was recently hospitalized and treated for congestive heart failure in September. After she was discharged home, she has been doing okay. She has been compliant to her medications. She uses walker to walk around at home. At about 2:30 PM, she started having shortness of breath. She also has very mild dry cough. She does not have other symptoms, including fever, chills, chest pain, nausea, vomiting, abdominal pain, diarrhea, rashes. She does not have pain over her calf area. EMS was called and found the patient has oxygen desaturation to 84%. She was brought to the hospital for further evaluation and treatment.  She is found to have negative troponin, but elevated proBNP at 9280. Chest x-ray showed pulmonary edema. She is admitted to inpatient folate evaluation.   Review of Systems: As presented in the history of presenting illness, rest negative.  Where does patient live?  With her husband at home in [place] Can patient participate in ADLs? Yes  Allergy: No Known Allergies  Past Medical History  Diagnosis Date  . Allergy, unspecified not elsewhere classified   . Hypertension   . Palpitations   . Chronic venous insufficiency   . Hypercholesteremia   . GERD (gastroesophageal reflux disease)   . Diverticulosis of colon   . History of urinary tract infection   . Urinary frequency   . Urinary incontinence   . Osteoporosis   . Neuropathy   . Anxiety   . History of shingles   . Heart murmur   . Stroke     "evidence of light stroke/Dr Kriste Basque recently" (09/23/2014)  . DJD (degenerative joint disease)     Past Surgical History  Procedure Laterality Date   . Vesicovaginal fistula closure w/ tah    . Tonsillectomy    . Abdominal hysterectomy      Social History:  reports that she has never smoked. She has never used smokeless tobacco. She reports that she does not drink alcohol or use illicit drugs.  Family History:  Family History  Problem Relation Age of Onset  . Transient ischemic attack Father   . Pneumonia Sister      Prior to Admission medications   Medication Sig Start Date End Date Taking? Authorizing Provider  ALPRAZolam (XANAX) 0.25 MG tablet Take 0.125-0.25 mg by mouth 3 (three) times daily as needed. For nerves   Yes Historical Provider, MD  aspirin EC 81 MG tablet Take 81 mg by mouth daily.   Yes Historical Provider, MD  clopidogrel (PLAVIX) 75 MG tablet Take 1 tablet (75 mg total) by mouth daily with breakfast. 09/02/14  Yes Brittainy Sherlynn Carbon, PA-C  donepezil (ARICEPT) 5 MG tablet Take 5 mg by mouth 2 (two) times daily.   Yes Historical Provider, MD  feeding supplement (ENSURE COMPLETE) LIQD Take 237 mLs by mouth 2 (two) times daily between meals. 10/15/12  Yes Rhetta Mura, MD  gabapentin (NEURONTIN) 100 MG capsule Take 100 mg by mouth 3 (three) times daily.   Yes Historical Provider, MD  lisinopril (PRINIVIL,ZESTRIL) 5 MG tablet Take 1 tablet (5 mg total) by mouth daily. 09/02/14  Yes Brittainy Sherlynn Carbon, PA-C  metoprolol succinate (TOPROL XL) 25 MG 24 hr tablet Take 0.5 tablets (12.5 mg  total) by mouth daily. 09/02/14  Yes Brittainy Sherlynn CarbonM Simmons, PA-C  Multiple Vitamin (MULTIVITAMIN) tablet Take 1 tablet by mouth daily.     Yes Historical Provider, MD  oxybutynin (DITROPAN) 5 MG tablet Start with 1/2 tablet two times daily 07/20/14  Yes Michele McalpineScott M Nadel, MD  phosphorus (K PHOS NEUTRAL) [phone] MG tablet Take 250 mg by mouth 2 (two) times daily.   Yes Historical Provider, MD  nitroGLYCERIN (NITROSTAT) 0.4 MG SL tablet Place 1 tablet (0.4 mg total) under the tongue every 5 (five) minutes x 3 doses as needed for chest pain.  09/02/14   Brittainy Sherlynn CarbonM Simmons, PA-C    Physical Exam: Filed Vitals:   09/23/14 2000 09/23/14 2100 09/23/14 2200 09/24/14 0131  BP: 172/65 120/50 140/54 101/43  Pulse: 61 44 46 44  Temp:   96.6 F (35.9 C) 97.1 F (36.2 C)  TempSrc:   Oral Oral  Resp: 27 18 22 18   Height:   5' (1.524 m)   Weight:   57.5 kg (126 lb 12.2 oz)   SpO2: 98% 92% 100% 98%   General: Not in acute distress HEENT:       Eyes: PERRL, EOMI, no scleral icterus       ENT: No discharge from the ears and nose, no pharynx injection, no tonsillar enlargement.        Neck: Positive JVD, no bruit, no mass felt. Cardiac: S1/S2, RRR, 1/6 systolic murmurs, gallops or rubs Pulm: Decreased air movement bilaterally. No rales, wheezing, rhonchi or rubs. Abd: Soft, nondistended, nontender, no rebound pain, no organomegaly, BS present Ext: 2+ pitting leg edema. 2+DP/PT pulse bilaterally Musculoskeletal: No joint deformities, erythema, or stiffness, ROM full Skin: No rashes.  Neuro: Alert and oriented X3, cranial nerves II-XII grossly intact, muscle strength 5/5 in all extremeties, sensation to light touch intact.  Psych: Patient is not psychotic, no suicidal or hemocidal ideation.  Labs on Admission:  Basic Metabolic Panel:  Recent Labs Lab 09/23/14 1700  NA 142  K 4.6  CL 102  CO2 34*  GLUCOSE 99  BUN 15  CREATININE 1.05  CALCIUM 10.2   Liver Function Tests: No results found for this basename: AST, ALT, ALKPHOS, BILITOT, PROT, ALBUMIN,  in the last 168 hours No results found for this basename: LIPASE, AMYLASE,  in the last 168 hours No results found for this basename: AMMONIA,  in the last 168 hours CBC:  Recent Labs Lab 09/23/14 1700  WBC 6.2  NEUTROABS 5.1  HGB 12.8  HCT 40.6  MCV 89.8  PLT 311   Cardiac Enzymes:  Recent Labs Lab 09/23/14 2220  TROPONINI <0.30    BNP (last 3 results)  Recent Labs  09/23/14 1700  PROBNP 9280.0*   CBG: No results found for this basename: GLUCAP,  in  the last 168 hours  Radiological Exams on Admission: Dg Chest Port 1 View  09/23/2014   CLINICAL DATA:  Shortness of breath with cough and congestion; right hand tremor, history of hypertension, anxiety and dementia  EXAM: PORTABLE CHEST - 1 VIEW  COMPARISON:  Portable chest x-ray of August 29, 2014  FINDINGS: The cardiopericardial silhouette is enlarged. The pulmonary vascularity is engorged. The pulmonary interstitial markings are increased. The hemidiaphragms are obscured and bilateral pleural effusions are present. There are degenerative changes of the shoulders.  IMPRESSION: Congestive heart failure with acute pulmonary and interstitial and alveolar edema and bilateral pleural effusions. These findings are new since the previous study.   Electronically Signed  By: David  [place]   On: 09/23/2014 17:03    EKG: Independently reviewed.   Assessment/Plan Principal Problem:   CHF exacerbation Active Problems:   HYPERCHOLESTEROLEMIA   Essential hypertension   GERD (gastroesophageal reflux disease)   Chronic kidney disease (CKD), stage III (moderate)   TIA (transient ischemic attack)  # CHF exacerbation: Patient's symptoms are most likely caused by CHF exacerbation given elevated proBNP, pulmonary edema on chest x-ray, JVD and 2+ leg edema. Patient does not have any chest pain, making pulmonary embolism or pneumonia unlikely diagnosis. - will admit to telemetry bed -will treat with IV lasix 40 mg qdaily -will continue home ASA,  Lisinopril. - will hold her Metoprolol given she is very bradycardia with heart rate 40-50/min. May resume with lower dose tomorrow -will cycle CE X3 -will get EKG in aM -strict In/Out -Daily body weight.  # HTN:  - continue home lisinopril and hold metoprolol  #: CAD: No chest pain -  Continue ASA - When necessary nitroglycerin  # hx of TIA: on ASA and plavix  #: CKD-III: Cre 1.05 on admission - Follow renal fx by BMP   DVT ppx: SQ Heparin  Code  Status: DNR Family Communication:  Yes, patient's  daguhter   at bed side Disposition Plan: Admit to inpatient   Date of Service 09/24/2014    Lorretta Harp [facility] Pager [phone]  If 7PM-7AM, please contact night-coverage [email or web address] Password [email or web address] 09/24/2014, 2:44 AM

## 2014-09-23 NOTE — ED Notes (Signed)
Removed TED hose temporarily since family reports the patient has been wearing them all day, and removes them at night time. Will keep at the bedside to have for admission.

## 2014-09-23 NOTE — ED Notes (Signed)
Inpatient hospitalist aware of heart rate in the 40's and discussed with family.  No new orders at this time

## 2014-09-23 NOTE — ED Notes (Signed)
Dr. Charyl Dancer, internal medicine at the bedside.

## 2014-09-23 NOTE — ED Notes (Signed)
Discussed with Venita Sheffield, receiving RN on the floor that stat meds have been ordered, but should be reviewed with ordering physician prior to administering due to recent lasix and nitro paste, and significant decrease of BP.  She acknowledges and includes as part of plan of care.

## 2014-09-23 NOTE — ED Provider Notes (Signed)
Level V caveat dementia history is obtained from the patient's son who accompanies her With progressively worsening shortness of breath and generalized weakness over approximately the past one week. I spoke with patient's son who states she is DO NOT RESUSCITATE CODE STATUS Exam patient is sleepy arousable to verbal stimulus HEENT exam no facial asymmetry neck supple positive JVD lungs diffuse rails. Heart bradycardic regular rhythm abdomen nondistended lower extremities edematous bilaterally  TEDsstockings bilateral  Doug Sou, MD 09/24/14 1749

## 2014-09-24 DIAGNOSIS — I509 Heart failure, unspecified: Secondary | ICD-10-CM

## 2014-09-24 DIAGNOSIS — R1312 Dysphagia, oropharyngeal phase: Secondary | ICD-10-CM | POA: Diagnosis present

## 2014-09-24 DIAGNOSIS — R001 Bradycardia, unspecified: Secondary | ICD-10-CM | POA: Diagnosis present

## 2014-09-24 LAB — BASIC METABOLIC PANEL
ANION GAP: 9 (ref 5–15)
BUN: 15 mg/dL (ref 6–23)
CALCIUM: 10 mg/dL (ref 8.4–10.5)
CO2: 35 meq/L — AB (ref 19–32)
CREATININE: 1.17 mg/dL — AB (ref 0.50–1.10)
Chloride: 101 mEq/L (ref 96–112)
GFR, EST AFRICAN AMERICAN: 43 mL/min — AB (ref >90)
GFR, EST NON AFRICAN AMERICAN: 37 mL/min — AB (ref >90)
Glucose, Bld: 97 mg/dL (ref 70–99)
Potassium: 3.9 mEq/L (ref 3.7–5.3)
Sodium: 145 mEq/L (ref 137–147)

## 2014-09-24 LAB — URINE CULTURE
Colony Count: NO GROWTH
Culture: NO GROWTH

## 2014-09-24 LAB — TROPONIN I
Troponin I: <0.3 ng/mL (ref <0.30)
Troponin I: <0.3 ng/mL (ref <0.30)

## 2014-09-24 LAB — MAGNESIUM: Magnesium: 2.4 mg/dL (ref 1.5–2.5)

## 2014-09-24 NOTE — Progress Notes (Signed)
Pt evaluated by speech therapy for swallowing. Results on chart. Dr Gonzella Lex texted to make aware

## 2014-09-24 NOTE — Progress Notes (Signed)
PT Cancellation Note  Patient Details Name: Marshana Baken MRN: [id number] DOB: 1911-12-11   Cancelled Treatment:    Reason Eval/Treat Not Completed: Patient declined, no reason specified Minister and pt's son present in room speaking with pt. Family is deciding on end of life issues, i.e. Feeding tube vs. Comfort care.  PT to follow-up tomorrow to determine pt/family's desire to move forward with PT eval.   Ilda Foil 09/24/2014, 3:40 PM

## 2014-09-24 NOTE — Progress Notes (Addendum)
Bedside swallow evaluation completed.   Full report to follow.    Concern for aspiration of secretions characterized by wet vocal quality that pt is unable to clear.  Symptoms of dysphagia and airway compromise noted across consistencies with delayed multiple swallows, consistent reflexive throat clearing and overt coughing after thin liquids.  Tiburcio Bash *son* present reports pt with throat clearing x3 weeks and coughing x1 week.  Suspect recent dysphagia due to deconditioning with current exacerbation.    At this time, SLP can not recommend a diet that pt will not aspirate.  Discussed with pt, family - pt desires "to try" to continue to eat but daughter *Avon Gully was not certain and was concerned about pnas.  SLP explained to pt/family that feeding tube will not prevent aspiration.  Advised RN to findings.   Donavan Burnet, MS [facility] SLP [phone]

## 2014-09-24 NOTE — Evaluation (Signed)
Clinical/Bedside Swallow Evaluation Patient Details  Name: Jessica [name] MRN: [id number] Date of Birth: 25-Jul-1911  Today's Date: 09/24/2014 Time: 1110-1205 SLP Time Calculation (min): 55 min  Past Medical History:  Past Medical History  Diagnosis Date  . Allergy, unspecified not elsewhere classified   . Hypertension   . Palpitations   . Chronic venous insufficiency   . Hypercholesteremia   . GERD (gastroesophageal reflux disease)   . Diverticulosis of colon   . History of urinary tract infection   . Urinary frequency   . Urinary incontinence   . Osteoporosis   . Neuropathy   . Anxiety   . History of shingles   . Heart murmur   . Stroke     "evidence of light stroke/Dr Kriste Basque recently" (09/23/2014)  . DJD (degenerative joint disease)    Past Surgical History:  Past Surgical History  Procedure Laterality Date  . Vesicovaginal fistula closure w/ tah    . Tonsillectomy    . Abdominal hysterectomy     HPI:  80 yo female adm to [facility] with shortness of breath.  PMH + for GERD, CHF with pulmonary edema, NSTEMI, shingles with neuropathy, stricture and ulcer s/p dilatation 09/2004.  CXR indicated CXR 10/9 Congestive heart failure with acute pulmonary and interstitial and alveolar edema and bilateral pleural effusions. Swallow evaluation ordered as RN reports concerns that pt is aspirating.     Assessment / Plan / Recommendation Clinical Impression  Pt demonstrating multiple signs/symptoms of oropharyngeal dysphagia with concern for overt aspiration and inability for pt to clear due to weakness.    Known esophageal dysphagia documented in history including GERD, ulcer, stricture s/p dilatation 2005.    Pt with wet and hoarse vocal quality at bedside without ability to maintain clearance with cued throat clears.  Multiple swallows noted across all consistencies including (x4-5) with delayed weak throat clearing.  She also indicates sensation of food in pharynx on right side.  Overt  coughing immediately post swallow noted with thin - suspect both due to premature spillage into larynx but also poor laryngeal elevation.  Anterior spillage of liquids noted *right which son Tiburcio Bash states has been occuring for the last 7 days.     Can not recommend a diet that pt will not aspirate without ability to clear due to weakness.  Recommend NPO and MD discuss with pt/HCPOA to determine their wishes.    Pt expressed desire to "try to eat" but daughter was worried about aspiration and pnas and directly asked about feeding tubes.  Advised family that feeding tubes do NOT prevent aspiration.    Given pt's advanced age, suspected secretion aspiration x3 weeks (per son chronic throat clearing since last hospital admit), and pt expressed desire to eat , allowing po with known risk may be optimial option.  Pt and family indicated clinical understanding to current recommendations.    SLP to follow up to help with care plan.       Aspiration Risk  Severe    Diet Recommendation NPO (oral moisture via toothette pending decision)   Medication Administration: Via alternative means (? IV)    Other  Recommendations   ? palliative referral to establish GOC  Follow Up Recommendations       Frequency and Duration min 2x/week  2 weeks   Pertinent Vitals/Pain Afebrile, decreased     Swallow Study Prior Functional Status   see HHX    General Date of Onset: 09/24/14 HPI: 80 yo female adm to [facility] with shortness  of breath.  PMH + for GERD, CHF with pulmonary edema, NSTEMI, shingles with neuropathy, stricture and ulcer s/p dilatation 09/2004.  CXR indicated CXR 10/9 Congestive heart failure with acute pulmonary and interstitial and alveolar edema and bilateral pleural effusions. Swallow evaluation ordered as RN reports concerns that pt is aspirating.   Type of Study: Bedside swallow evaluation Diet Prior to this Study: Regular;Thin liquids Temperature Spikes Noted: No Respiratory Status:  Nasal cannula History of Recent Intubation: No Behavior/Cognition: Alert;Cooperative (some of her speech was unintelligible likely due to weakness) Oral Cavity - Dentition: Adequate natural dentition Self-Feeding Abilities: Able to feed self (slow rate of eating) Patient Positioning: Upright in bed Baseline Vocal Quality: Hoarse;Low vocal intensity Volitional Cough: Weak Volitional Swallow: Able to elicit    Oral/Motor/Sensory Function Overall Oral Motor/Sensory Function:  (generalized weakness but no focal CN deficits)   Ice Chips Ice chips: Not tested Other Comments: pt does not like to consume very cold items per her statement   Thin Liquid Thin Liquid: Impaired Presentation: Spoon;Cup Oral Phase Impairments: Reduced lingual movement/coordination;Impaired anterior to posterior transit Oral Phase Functional Implications: Right anterior spillage;Prolonged oral transit Pharyngeal  Phase Impairments: Multiple swallows;Cough - Immediate;Throat Clearing - Immediate;Decreased hyoid-laryngeal movement    Nectar Thick Nectar Thick Liquid: Impaired Oral Phase Impairments: Reduced lingual movement/coordination;Impaired anterior to posterior transit Oral phase functional implications: Prolonged oral transit;Right anterior spillage Pharyngeal Phase Impairments: Suspected delayed Swallow;Throat Clearing - Delayed;Multiple swallows;Decreased hyoid-laryngeal movement   Honey Thick Honey Thick Liquid: Not tested   Puree Puree: Impaired Presentation: Self Fed;Spoon Oral Phase Impairments: Reduced lingual movement/coordination;Impaired anterior to posterior transit Oral Phase Functional Implications: Prolonged oral transit Pharyngeal Phase Impairments: Suspected delayed Swallow;Multiple swallows;Decreased hyoid-laryngeal movement;Throat Clearing - Immediate Other Comments: sensation of residuals on right side of throat per pt   Solid   GO    Solid: Impaired Oral Phase Impairments: Reduced lingual  movement/coordination;Impaired anterior to posterior transit Oral Phase Functional Implications:  (slow mastication) Pharyngeal Phase Impairments: Suspected delayed Swallow;Decreased hyoid-laryngeal movement;Multiple swallows;Throat Clearing - Delayed       Donavan Burnetamara Shatasia Cutshaw, MS [facility] SLP [phone]

## 2014-09-24 NOTE — Progress Notes (Signed)
[facility] PROGRESS NOTE  Jessica [name] WNI:[id number] DOB: 08-03-1911 DOA: 09/23/2014 PCP: [name], MD  Assessment/Plan: CHF exacerbation:  - Monitor on tele - IV lasix 40 mg qdaily  -continue home  Dose ASA, Lisinopril.  - hold her Metoprolol given she is very bradycardia with heart rate as low as 30s. -strict In/Out and daily wts   HTN:  - continue home lisinopril and hold metoprolol   CAD:  - Continue ASA  - When necessary nitroglycerin    hx of TIA:  on ASA and plavix   CKD-III:  Stable  Dysphagia  patient failed swallow eval with high risk for aspiration. Discuss in detail with her son at bedside. He doesnot wish for any feeding tube and says the patient never wanted to have one. He would like her to receive comfort feeding if possible. he was explained the risk of aspiration associated with it but still prefers she be allowed to eat. Ordered dys level 1 diet . Give meds crushed in puree  DVT prophylaxis: SQ Heparin        Code Status: DNR Family Communication: Spoke in detail with son at bedside Disposition Plan: home    Consultants:  none  Procedures:  none  Antibiotics:  none  HPI/Subjective: aptient reports dyspnea to be somewhat better. Failed bedside swallow eval  Objective: Filed Vitals:   09/24/14 1300  BP: 112/42  Pulse: 51  Temp: 97.2 F (36.2 C)  Resp: 18    Intake/Output Summary (Last 24 hours) at 09/24/14 1628 Last data filed at 09/24/14 1300  Gross per 24 hour  Intake    120 ml  Output    751 ml  Net   -631 ml   Filed Weights   09/23/14 2200 09/24/14 0631  Weight: 57.5 kg (126 lb 12.2 oz) 56.609 kg (124 lb 12.8 oz)    Exam:   General:  NAD, frail   HEENT: moist mucosa  Cardiovascular: S1 &S2 bradycardic, no murmurs  Respiratory: diminished breath sounds at bases  Abdomen: soft, NT, ND, BS+  EXT: trace edema   Data Reviewed: Basic Metabolic Panel:  Recent Labs Lab 09/23/14 1700  09/24/14 0351  NA 142 145  K 4.6 3.9  CL 102 101  CO2 34* 35*  GLUCOSE 99 97  BUN 15 15  CREATININE 1.05 1.17*  CALCIUM 10.2 10.0  MG  --  2.4   Liver Function Tests: No results found for this basename: AST, ALT, ALKPHOS, BILITOT, PROT, ALBUMIN,  in the last 168 hours No results found for this basename: LIPASE, AMYLASE,  in the last 168 hours No results found for this basename: AMMONIA,  in the last 168 hours CBC:  Recent Labs Lab 09/23/14 1700  WBC 6.2  NEUTROABS 5.1  HGB 12.8  HCT 40.6  MCV 89.8  PLT 311   Cardiac Enzymes:  Recent Labs Lab 09/23/14 2220 09/24/14 0351 09/24/14 0930  TROPONINI <0.30 <0.30 <0.30   BNP (last 3 results)  Recent Labs  09/23/14 1700  PROBNP 9280.0*   CBG: No results found for this basename: GLUCAP,  in the last 168 hours  No results found for this or any previous visit (from the past 240 hour(s)).   Studies: Dg Chest Port 1 View  09/23/2014   CLINICAL DATA:  Shortness of breath with cough and congestion; right hand tremor, history of hypertension, anxiety and dementia  EXAM: PORTABLE CHEST - 1 VIEW  COMPARISON:  Portable chest x-ray of August 29, 2014  FINDINGS:  The cardiopericardial silhouette is enlarged. The pulmonary vascularity is engorged. The pulmonary interstitial markings are increased. The hemidiaphragms are obscured and bilateral pleural effusions are present. There are degenerative changes of the shoulders.  IMPRESSION: Congestive heart failure with acute pulmonary and interstitial and alveolar edema and bilateral pleural effusions. These findings are new since the previous study.   Electronically Signed   By: David  [place]   On: 09/23/2014 17:03    Scheduled Meds: . aspirin EC  81 mg Oral Daily  . clopidogrel  75 mg Oral Q breakfast  . feeding supplement (ENSURE COMPLETE)  237 mL Oral BID BM  . furosemide  40 mg Intravenous Daily  . gabapentin  100 mg Oral TID  . heparin  5,000 Units Subcutaneous 3 times per day   . Influenza vac split quadrivalent PF  0.5 mL Intramuscular Tomorrow-1000  . lisinopril  5 mg Oral Daily  . multivitamin with minerals  1 tablet Oral Daily  . oxybutynin  2.5 mg Oral BID  . phosphorus  250 mg Oral BID  . sodium chloride  3 mL Intravenous Q12H  . sodium chloride  3 mL Intravenous Q12H   Continuous Infusions:     Time spent: 25 MINUTES    Jessica [name]  [facility] Pager [phone]. If 7PM-7AM, please contact night-coverage at [email or web address], password [email or web address] 09/24/2014, 4:28 PM  LOS: 1 day

## 2014-09-25 MED ORDER — FUROSEMIDE 10 MG/ML IJ SOLN
40.0000 mg | Freq: Two times a day (BID) | INTRAMUSCULAR | Status: DC
  Administered 2014-09-25 – 2014-09-26 (×3): 40 mg via INTRAVENOUS
  Filled 2014-09-25 (×6): qty 4

## 2014-09-25 NOTE — Progress Notes (Signed)
[facility] PROGRESS NOTE  Yar Clowney XNA:[id number] DOB: 12-Feb-1911 DOA: 09/23/2014 PCP: [name], MD   Brief narrative 80 y.o. female with PMH of hypertension, CHF, hx of STEMI,hyperlipidemia, GERD, who presents with SOB  Patient was recently hospitalized and treated for congestive heart failure. On the day of admission she had acute onset of SOB.  She also had very mild dry cough. She did not have other symptoms, including fever, chills, chest pain, nausea, vomiting, abdominal pain, diarrhea, rashes.  EMS was called and found the patient has oxygen desaturation to 84%. She was brought to the hospital for further evaluation and treatment.  She was found to have negative troponin, but elevated proBNP at 9280. Chest x-ray showed pulmonary edema.   Assessment/Plan: CHF exacerbation:  - Monitor on tele - increase lasix to 40 mg IV bid -continue home dose ASA, Lisinopril.  - holding  her Metoprolol given she is very bradycardia with heart rate as low as 30s. -strict In/Out and daily wts   HTN:  - continue home lisinopril and hold metoprolol   CAD:  - Continue ASA  - When necessary nitroglycerin    hx of TIA:  on ASA and plavix   CKD-III:  Stable  Dysphagia  patient failed swallow eval with high risk for aspiration. Discuss in detail with her son at bedside. He doesnot wish for any feeding tube and says the patient never wanted to have one. He would like her to receive comfort feeding if possible. he was explained the risk of aspiration associated with it but still prefers she be allowed to eat. Ordered dys level 1 diet . Give meds crushed in puree  DVT prophylaxis: SQ Heparin        Code Status: DNR Family Communication: Spoke in detail with son at bedside. Son interested in discussing goals of care with palliative care. Will consult  Disposition Plan: home    Consultants:  none  Procedures:  none  Antibiotics:  none  HPI/Subjective: Patient  unable to tell if she was any better.   Objective: Filed Vitals:   09/25/14 0609  BP: 129/44  Pulse: 52  Temp: 97.2 F (36.2 C)  Resp: 16    Intake/Output Summary (Last 24 hours) at 09/25/14 1335 Last data filed at 09/24/14 1700  Gross per 24 hour  Intake      0 ml  Output      0 ml  Net      0 ml   Filed Weights   09/23/14 2200 09/24/14 0631 09/25/14 0609  Weight: 57.5 kg (126 lb 12.2 oz) 56.609 kg (124 lb 12.8 oz) 59.2 kg (130 lb 8.2 oz)    Exam:   General:  NAD, frail   HEENT: moist mucosa  Cardiovascular: S1 &S2 bradycardic, no murmurs  Respiratory: diminished breath sounds at bases  Abdomen: soft, NT, ND, BS+  EXT: trace edema b/l   Data Reviewed: Basic Metabolic Panel:  Recent Labs Lab 09/23/14 1700 09/24/14 0351  NA 142 145  K 4.6 3.9  CL 102 101  CO2 34* 35*  GLUCOSE 99 97  BUN 15 15  CREATININE 1.05 1.17*  CALCIUM 10.2 10.0  MG  --  2.4   Liver Function Tests: No results found for this basename: AST, ALT, ALKPHOS, BILITOT, PROT, ALBUMIN,  in the last 168 hours No results found for this basename: LIPASE, AMYLASE,  in the last 168 hours No results found for this basename: AMMONIA,  in the last 168 hours CBC:  Recent Labs Lab 09/23/14 1700  WBC 6.2  NEUTROABS 5.1  HGB 12.8  HCT 40.6  MCV 89.8  PLT 311   Cardiac Enzymes:  Recent Labs Lab 09/23/14 2220 09/24/14 0351 09/24/14 0930  TROPONINI <0.30 <0.30 <0.30   BNP (last 3 results)  Recent Labs  09/23/14 1700  PROBNP 9280.0*   CBG: No results found for this basename: GLUCAP,  in the last 168 hours  Recent Results (from the past 240 hour(s))  URINE CULTURE     Status: None   Collection Time    09/23/14  4:42 PM      Result Value Ref Range Status   Specimen Description URINE, CATHETERIZED   Final   Special Requests NONE   Final   Culture  Setup Time     Final   Value: 09/23/2014 22:14     [facility]   Colony Count     Final   Value: NO  GROWTH     [facility]   Culture     Final   Value: NO GROWTH     [facility]formed at [facility]   Report Status 09/24/2014 FINAL   Final     Studies: Dg Chest Port 1 View  09/23/2014   CLINICAL DATA:  Shortness of breath with cough and congestion; right hand tremor, history of hypertension, anxiety and dementia  EXAM: PORTABLE CHEST - 1 VIEW  COMPARISON:  Portable chest x-ray of August 29, 2014  FINDINGS: The cardiopericardial silhouette is enlarged. The pulmonary vascularity is engorged. The pulmonary interstitial markings are increased. The hemidiaphragms are obscured and bilateral pleural effusions are present. There are degenerative changes of the shoulders.  IMPRESSION: Congestive heart failure with acute pulmonary and interstitial and alveolar edema and bilateral pleural effusions. These findings are new since the previous study.   Electronically Signed   By: David  [place]   On: 09/23/2014 17:03    Scheduled Meds: . aspirin EC  81 mg Oral Daily  . clopidogrel  75 mg Oral Q breakfast  . feeding supplement (ENSURE COMPLETE)  237 mL Oral BID BM  . furosemide  40 mg Intravenous Daily  . gabapentin  100 mg Oral TID  . heparin  5,000 Units Subcutaneous 3 times per day  . Influenza vac split quadrivalent PF  0.5 mL Intramuscular Tomorrow-1000  . lisinopril  5 mg Oral Daily  . multivitamin with minerals  1 tablet Oral Daily  . oxybutynin  2.5 mg Oral BID  . phosphorus  250 mg Oral BID  . sodium chloride  3 mL Intravenous Q12H  . sodium chloride  3 mL Intravenous Q12H   Continuous Infusions:     Time spent: 25 MINUTES    Vanessia Bokhari  [facility] Pager [phone]. If 7PM-7AM, please contact night-coverage at [email or web address], password [email or web address] 09/25/2014, 1:35 PM  LOS: 2 days

## 2014-09-25 NOTE — Evaluation (Signed)
Occupational Therapy Evaluation Patient Details Name: Jessica [name] MRN: [id number] DOB: 1911-06-13 Today's Date: 09/25/2014    History of Present Illness HPI: Jessica [name] is a [age] y.o. female with PMH of hypertension, CHF, hx of STEMI,hyperlipidemia, GERD, who presents with SOB; recent admission 3 wks ago with MI   Clinical Impression   Pt s/p above. Pt independent with ADLs, PTA. Feel pt will benefit from acute OT to increase independence prior to d/c.     Follow Up Recommendations  Home health OT;Supervision/Assistance - 24 hour    Equipment Recommendations  None recommended by OT    Recommendations for Other Services       Precautions / Restrictions Precautions Precautions: Fall Restrictions Weight Bearing Restrictions: No      Mobility Bed Mobility               General bed mobility comments: not assessed  Transfers Overall transfer level: Needs assistance Equipment used: Rolling walker (2 wheeled) Transfers: Sit to/from Stand Sit to Stand: Min assist;Mod assist         General transfer comment: assist to boost.     Balance                                            ADL Overall ADL's : Needs assistance/impaired     Grooming: Wash/dry face;Oral care;Wash/dry hands;Min guard;Sitting;Standing   Upper Body Bathing: Set up;Supervision/ safety;Sitting   Lower Body Bathing: Min guard;Sit to/from stand   Upper Body Dressing : Set up;Sitting   Lower Body Dressing: Sit to/from stand;Moderate assistance   Toilet Transfer: Min guard;Ambulation;RW;Minimal assistance;Moderate assistance (chair; Min-Mod A for transfers; Min guard for ambulation)           Functional mobility during ADLs: Min guard;Rolling walker (took a few steps) General ADL Comments: Performed ADLs at sink. Took a few steps in room at LandisMin guard level. discussed long handled sponge for LB bathing and pt not interested in sockaid.  Pt did start to cough when  performing oral care-nurse notified and has been evaluated by SLP.     Vision                     Perception     Praxis      Pertinent Vitals/Pain Pain Assessment: No/denies pain     Hand Dominance     Extremity/Trunk Assessment Upper Extremity Assessment Upper Extremity Assessment: Generalized weakness   Lower Extremity Assessment Lower Extremity Assessment: Defer to PT evaluation       Communication Communication Communication: No difficulties   Cognition Arousal/Alertness: Awake/alert Behavior During Therapy: WFL for tasks assessed/performed Overall Cognitive Status:  (unsure of baseline-said wrong birthday year)                     General Comments       Exercises       Shoulder Instructions      Home Living Family/patient expects to be discharged to:: Private residence Living Arrangements: Spouse/significant other;Children Available Help at Discharge: Family Type of Home: House Home Access: Level entry     Home Layout: One level     Bathroom Shower/Tub: Tub/shower unit         Home Equipment: Environmental consultantWalker - 2 wheels;Cane - quad;Wheelchair - manual;Bedside commode;Tub bench          Prior Functioning/Environment Level of Independence:  Independent with assistive device(s)        Comments: pt. reports she uses quad cane indoors and RW for out of home    OT Diagnosis: Generalized weakness   OT Problem List: Decreased strength;Decreased activity tolerance;Decreased knowledge of use of DME or AE;Decreased knowledge of precautions;Decreased cognition   OT Treatment/Interventions: Self-care/ADL training;Therapeutic exercise;DME and/or AE instruction;Therapeutic activities;Patient/family education;Balance training;Cognitive remediation/compensation    OT Goals(Current goals can be found in the care plan section) Acute Rehab OT Goals Patient Stated Goal: home OT Goal Formulation: With patient Time For Goal Achievement:  10/02/14 Potential to Achieve Goals: Good ADL Goals Pt Will Perform Upper Body Bathing: sitting;with set-up Pt Will Perform Lower Body Bathing: sit to/from stand;with set-up Pt Will Perform Lower Body Dressing: with set-up;sit to/from stand Pt Will Transfer to Toilet: with modified independence;ambulating;bedside commode Pt Will Perform Toileting - Clothing Manipulation and hygiene: with modified independence;sit to/from stand  OT Frequency: Min 2X/week   Barriers to D/C:            Co-evaluation              End of Session Equipment Utilized During Treatment: Gait belt;Rolling walker;Oxygen Nurse Communication: Mobility status; coughing with liquid  Activity Tolerance: Patient tolerated treatment well Patient left: in chair;with call bell/phone within reach;with family/visitor present   Time: 3220-2542 OT Time Calculation (min): 33 min Charges:  OT General Charges $OT Visit: 1 Procedure OT Evaluation $Initial OT Evaluation Tier I: 1 Procedure OT Treatments $Self Care/Home Management : 8-22 mins G-CodesEarlie Raveling OTR/L [phone] 09/25/2014, 6:32 PM

## 2014-09-25 NOTE — Evaluation (Signed)
Physical Therapy Evaluation Patient Details Name: Jessica [name] [place] MRN: [id number] DOB: 1911/10/20 Today's Date: 09/25/2014   History of Present Illness  HPI: Jessica [name] Woessner is a 80 y.o. female with PMH of hypertension, CHF, hx of STEMI,hyperlipidemia, GERD, who presents with SOB; recent admission 3 wks ago with MI  Clinical Impression   Pt admitted with above. Pt currently with functional limitations due to the deficits listed below (see PT Problem List).  Pt will benefit from skilled PT to increase their independence and safety with mobility to allow discharge to the venue listed below.       Follow Up Recommendations Supervision/Assistance - 24 hour;Other (comment) (For dc home, recommend 24 hour assist, which son indicated he can provide; will recommend re-starting HHPT, HHOT, HHRN; Does pt qualify for an aide?)    Equipment Recommendations  Wheelchair (measurements PT);Wheelchair cushion (measurements PT);3in1 (PT)    Recommendations for Other Services OT consult;Other (comment) (Palliative Care)     Precautions / Restrictions Precautions Precautions: Fall Restrictions Weight Bearing Restrictions: No      Mobility  Bed Mobility Overal bed mobility: Needs Assistance Bed Mobility: Rolling;Sidelying to Sit Rolling: Mod assist Sidelying to sit: Mod assist       General bed mobility comments: Cues for technique and initiation; mod assist in helping pt reach for R bedrail eith L UE for rolling; light mod assist to come to sit  Transfers Overall transfer level: Needs assistance Equipment used: Rolling walker (2 wheeled) Transfers: Sit to/from Stand Sit to Stand: Min assist         General transfer comment:  min assist for sit <>stand  from bed and to recliner.  Steady assist for transition.    Ambulation/Gait Ambulation/Gait assistance: Min assist Ambulation Distance (Feet):  (pivot steps bed to chair) Assistive device: Rolling walker (2 wheeled)       General  Gait Details: Pt. needed min steadying assist for short few steps to recliner chair  Stairs            Wheelchair Mobility    Modified Rankin (Stroke Patients Only)       Balance             Standing balance-Leahy Scale: Poor Standing balance comment: Needed support of RW                             Pertinent Vitals/Pain Pain Assessment: No/denies pain    Home Living Family/patient expects to be discharged to:: Private residence Living Arrangements: Spouse/significant other;Children Available Help at Discharge: Family Type of Home: House Home Access: Level entry     Home Layout: One level Home Equipment: Environmental consultantWalker - 2 wheels;Cane - quad;Wheelchair - manual      Prior Function Level of Independence: Independent with assistive device(s)         Comments: pt. reports she uses quad cane indoors and RW for out of home     Hand Dominance        Extremity/Trunk Assessment   Upper Extremity Assessment: Generalized weakness           Lower Extremity Assessment: Generalized weakness         Communication   Communication: No difficulties  Cognition Arousal/Alertness: Awake/alert Behavior During Therapy: WFL for tasks assessed/performed Overall Cognitive Status: Within Functional Limits for tasks assessed                      General Comments  Exercises        Assessment/Plan    PT Assessment Patient needs continued PT services  PT Diagnosis Difficulty walking;Generalized weakness   PT Problem List Decreased strength;Decreased activity tolerance;Decreased balance;Decreased mobility;Decreased knowledge of use of DME;Cardiopulmonary status limiting activity  PT Treatment Interventions DME instruction;Gait training;Functional mobility training;Therapeutic activities;Therapeutic exercise;Balance training;Patient/family education   PT Goals (Current goals can be found in the Care Plan section) Acute Rehab PT Goals Patient  Stated Goal: home PT Goal Formulation: With patient Time For Goal Achievement: 10/09/14 Potential to Achieve Goals: Fair    Frequency Min 3X/week   Barriers to discharge        Co-evaluation               End of Session Equipment Utilized During Treatment: Gait belt Activity Tolerance: Patient tolerated treatment well Patient left: in chair;with call bell/phone within reach;with family/visitor present Nurse Communication: Mobility status (and that chair alarm pad is in chair; needs box)         Time: 6333-5456 PT Time Calculation (min): 36 min   Charges:   PT Evaluation $Initial PT Evaluation Tier I: 1 Procedure PT Treatments $Therapeutic Activity: 8-22 mins   PT G Codes:          Olen Pel 09/25/2014, 1:03 PM  Van Clines, [place]  Acute Rehabilitation Services Pager [phone] Office [phone]

## 2014-09-26 ENCOUNTER — Telehealth: Payer: Self-pay | Admitting: Pulmonary Disease

## 2014-09-26 MED ORDER — RESOURCE THICKENUP CLEAR PO POWD
ORAL | Status: DC | PRN
  Filled 2014-09-26: qty 125

## 2014-09-26 NOTE — Telephone Encounter (Signed)
Spoke with Aram Beecham [place](EC)- states that the patient is due to be discharged tomorrow 09-27-14 Wanting to know of a good nursing home to have patient discharged into. Pt EC aware that our office does not do this type of placement that if the patient is still in the hospital, the Social Worker would set this up for them. Aram Beecham states that she still wants to have Dr Jodelle Green input--requests that this message be sent to him for rec's of a good placement.  Aram Beecham aware that Dr Kriste Basque not in office until 09/27/14 2PM, aware that this will more than likely be after she is D/C.  Requests message be sent anyway.   Please advise Dr Kriste Basque thanks.

## 2014-09-26 NOTE — Progress Notes (Signed)
Patient Jessica [name]      DOB: 25-May-1911      GMW:[id number]  Visited with Jessica [name] this am. She reports feeling better. She is ok with me contacting his son to discuss goals for care. Jessica [name]. He wants his sister to be present. Patient reports she has a spouse but son is surrogate will get more information on this issue.  Jessica [name] plans to call PMT back with a time for tomorrow 10/13- so that his sister can be present.   Jessica Cater L. Ladona Ridgel, MD MBA The Palliative Medicine Team at [facility] Phone: [phone] Pager: [phone] ( Use team phone after hours)

## 2014-09-26 NOTE — Telephone Encounter (Signed)
Spoke with Mallory with [name] Needing verbal orders to continue Occupational Therapy- Okay to continue Occ therapy 1 x weekly for the 1st week, followed by 2 x weekly x 3 weeks.  Mallory is scheduled to see the patient today at 12p  Called and LM to call back

## 2014-09-26 NOTE — Telephone Encounter (Signed)
Mallory w/ [name] is returning call & can be reached at [phone].  Antionette Fairy

## 2014-09-26 NOTE — Progress Notes (Signed)
[facility] PROGRESS NOTE  Jessica [name] ZOX:[id number] DOB: Apr 28, 1911 DOA: 09/23/2014 PCP: Jessica [name],SCOTT M, MD   Brief narrative 80 y.o. female with PMH of hypertension, CHF, hx of STEMI,hyperlipidemia, GERD, who presents with SOB  Patient was recently hospitalized and treated for congestive heart failure. On the day of admission she had acute onset of SOB.  She also had very mild dry cough. She did not have other symptoms, including fever, chills, chest pain, nausea, vomiting, abdominal pain, diarrhea, rashes.  EMS was called and found the patient has oxygen desaturation to 84%. She was brought to the hospital for further evaluation and treatment.  She was found to have negative troponin, but elevated proBNP at 9280. Chest x-ray showed pulmonary edema.   Assessment/Plan: CHF exacerbation:  - Monitor on tele - increase lasix to 40 mg IV bid. Leg edema improved. lung sounds are clear -continue home dose ASA, Lisinopril.  - holding  her Metoprolol given  bradycardia with heart rate as low as 30s. -strict In/Out and daily wts   HTN:  - continue home lisinopril and hold metoprolol   CAD:  - Continue ASA  - When necessary nitroglycerin    hx of TIA:  on ASA and plavix   CKD-III:  Stable  Dysphagia  patient failed swallow eval with high risk for aspiration. Discuss in detail with her son at bedside. He doesnot wish for any feeding tube and says the patient never wanted to have one. He would like her to receive comfort feeding if possible. he was explained the risk of aspiration associated with it but still prefers she be allowed to eat.  dys level 1 diet . May advance for comfort feeds .Give meds crushed in puree  DVT prophylaxis: SQ Heparin        Code Status: DNR Family Communication: family interested in palliative discussion for goals of care. Consulted.  Disposition Plan: home possibly on  10/13   Consultants:  none  Procedures:  none  Antibiotics:  none  HPI/Subjective: Patient appears more alert and communicating well. Reports her breathing to be as close as being normal  Objective: Filed Vitals:   09/26/14 0654  BP: 151/55  Pulse: 69  Temp: 97.4 F (36.3 C)  Resp: 18    Intake/Output Summary (Last 24 hours) at 09/26/14 1055 Last data filed at 09/26/14 0914  Gross per 24 hour  Intake    360 ml  Output      0 ml  Net    360 ml   Filed Weights   09/24/14 0631 09/25/14 0609 09/26/14 0654  Weight: 56.609 kg (124 lb 12.8 oz) 59.2 kg (130 lb 8.2 oz) 55.3 kg (121 lb 14.6 oz)    Exam:   General:  NAD,    HEENT: moist mucosa  Cardiovascular: S1 &S2 normal, no murmurs  Respiratory: clear breath sounds at bases  Abdomen: soft, NT, ND, BS+  EXT: trace edema b/l ( improving)  CNS: alert and oriented   Data Reviewed: Basic Metabolic Panel:  Recent Labs Lab 09/23/14 1700 09/24/14 0351  NA 142 145  K 4.6 3.9  CL 102 101  CO2 34* 35*  GLUCOSE 99 97  BUN 15 15  CREATININE 1.05 1.17*  CALCIUM 10.2 10.0  MG  --  2.4   Liver Function Tests: No results found for this basename: AST, ALT, ALKPHOS, BILITOT, PROT, ALBUMIN,  in the last 168 hours No results found for this basename: LIPASE, AMYLASE,  in the last 168 hours No  results found for this basename: AMMONIA,  in the last 168 hours CBC:  Recent Labs Lab 09/23/14 1700  WBC 6.2  NEUTROABS 5.1  HGB 12.8  HCT 40.6  MCV 89.8  PLT 311   Cardiac Enzymes:  Recent Labs Lab 09/23/14 2220 09/24/14 0351 09/24/14 0930  TROPONINI <0.30 <0.30 <0.30   BNP (last 3 results)  Recent Labs  09/23/14 1700  PROBNP 9280.0*   CBG: No results found for this basename: GLUCAP,  in the last 168 hours  Recent Results (from the past 240 hour(s))  URINE CULTURE     Status: None   Collection Time    09/23/14  4:42 PM      Result Value Ref Range Status   Specimen Description URINE,  CATHETERIZED   Final   Special Requests NONE   Final   Culture  Setup Time     Final   Value: 09/23/2014 22:14     Performed at [facility] Count     Final   Value: NO GROWTH     Performed at [facility]   Culture     Final   Value: NO GROWTH     Performed at [facility]   Report Status 09/24/2014 FINAL   Final     Studies: No results found.  Scheduled Meds: . aspirin EC  81 mg Oral Daily  . clopidogrel  75 mg Oral Q breakfast  . feeding supplement (ENSURE COMPLETE)  237 mL Oral BID BM  . furosemide  40 mg Intravenous Q12H  . gabapentin  100 mg Oral TID  . heparin  5,000 Units Subcutaneous 3 times per day  . Influenza vac split quadrivalent PF  0.5 mL Intramuscular Tomorrow-1000  . lisinopril  5 mg Oral Daily  . multivitamin with minerals  1 tablet Oral Daily  . oxybutynin  2.5 mg Oral BID  . phosphorus  250 mg Oral BID  . sodium chloride  3 mL Intravenous Q12H  . sodium chloride  3 mL Intravenous Q12H   Continuous Infusions:     Time spent: 25 MINUTES    Mateya Torti  [facility] Pager [phone]. If 7PM-7AM, please contact night-coverage at [email or web address], password [facility] 09/26/2014, 10:55 AM  LOS: 3 days

## 2014-09-26 NOTE — Telephone Encounter (Signed)
Spoke with Jessica [name] w/ [name] Verbal order given per Leigh(SN nurse) to continue Occ therapy 1 x weekly for the 1st week, followed by 2 x weekly x 3 weeks.  Nothing further needed.

## 2014-09-26 NOTE — Progress Notes (Signed)
Speech Language Pathology Treatment: Dysphagia  Patient Details Name: Jessica [name] MRN: [id number] DOB: [birth year] Today's Date: 09/26/2014 Time: 9242-6834 SLP Time Calculation (min): 11 min  Assessment / Plan / Recommendation Clinical Impression  F/u after swallow assessment 10/10.  Pt alert, pleasant and interactive; states she is feeling better and is requesting coffee.  Continues to present with mild signs of dysphagia with aspiration risk, but toleration appears to have improved since time of evaluation.  Pt has been eating a dysphagia 1 diet with thin liquids since yesterday; lungs are diminished but clear.  Per Dr. Lubertha Basque note, meeting to discuss GOC will take place tomorrow.  Pt has expressed her desire to eat to Dr. Gonzella Lex and her son.  She does not want enteral feeding if swallow function declines. Recommend continuing current diet pending meeting; advance per pt's wishes.  No further SLP needs identified.  Will sign off.     HPI HPI: 80 yo female adm to [facility] with shortness of breath.  PMH + for GERD, CHF with pulmonary edema, NSTEMI, shingles with neuropathy, stricture and ulcer s/p dilatation 09/2004.  CXR indicated CXR 10/9 Congestive heart failure with acute pulmonary and interstitial and alveolar edema and bilateral pleural effusions. Swallow evaluation ordered as RN reports concerns that pt is aspirating.     Pertinent Vitals    SLP Plan  Discharge SLP treatment due to (comment)    Recommendations Diet recommendations:  (advance per preferences post Palliative meeting) Liquids provided via: Cup;Straw Medication Administration: Crushed with puree Supervision: Patient able to self feed Compensations: Slow rate;Small sips/bites Postural Changes and/or Swallow Maneuvers: Seated upright 90 degrees;Upright 30-60 min after meal              Oral Care Recommendations: Oral care BID Follow up Recommendations: None Plan: Discharge SLP treatment due to (comment)   Jessica [name] L.  Samson Frederic, [place] CCC/SLP Pager [phone]      Jessica [name] 09/26/2014, 10:01 AM

## 2014-09-26 NOTE — Telephone Encounter (Signed)
LM x 1 w/Mallory to return call.

## 2014-09-26 NOTE — Progress Notes (Signed)
Chaplain responded to referral.  Patient alone in room watching t.v.  Pt shared about family; daughter, son and husband.  Pt says is member of [employer] in [place], has attended all of life.  Pt claims has been "very well treated" here at the hospital and is grateful for the care.  Chaplain provided empathetic listening, emotional and spiritual support as well as the ministry of presence and prayer.  Chaplain will follow up as needed.  09/26/14 0900  Clinical Encounter Type  Visited With Patient  Visit Type Initial;Spiritual support  Referral From Physician  Spiritual Encounters  Spiritual Needs Prayer;Emotional  Stress Factors  Patient Stress Factors Exhausted;Family relationships  Family Stress Factors None identified  Advance Directives (For Healthcare)  Does patient have an advance directive? Yes   Blain Pais

## 2014-09-26 NOTE — Care Management Note (Addendum)
    Page 1 of 2   09/29/2014     1:36:47 PM CARE MANAGEMENT NOTE 09/29/2014  Patient:  Jessica [name], Jessica [name]   Account Number:  [id number]  Date Initiated:  09/26/2014  Documentation initiated by:  Oletta Cohn  Subjective/Objective Assessment:   80 y.o. female with PMH of hypertension, CHF, hx of STEMI,hyperlipidemia, GERD, who presents with SOB//Home with spouse 97.5 yo     Action/Plan:   will admit to telemetry bed  -will treat with IV lasix 40 mg//Access for disposition needs.   Anticipated DC Date:  09/29/2014   Anticipated DC Plan:  HOME W HOME HEALTH SERVICES      DC Planning Services  CM consult  [facility]      [facility] Choice  HOME HEALTH  Resumption Of Svcs/PTA Provider   Choice offered to / List presented to:  C-1 Patient   DME arranged  WHEELCHAIR - MANUAL      DME agency  [facility]     [facility] arranged  HH-1 RN  HH-10 DISEASE MANAGEMENT  HH-3 OT  HH-4 NURSE'S AIDE      HH agency  [facility]   Status of service:  Completed, signed off Medicare Important Message given?  YES (If response is "NO", the following Medicare IM given date fields will be blank) Date Medicare IM given:  09/26/2014 Medicare IM given by:  [facility] Date Additional Medicare IM given:  09/29/2014 Additional Medicare IM given by:  [facility]  Discharge Disposition:  HOME W HOME HEALTH SERVICES  Per UR Regulation:  Reviewed for med. necessity/level of care/duration of stay  If discussed at Long Length of Stay Meetings, dates discussed:   09/29/2014    Comments:  09/29/14 1100 Jerre Diguglielmo Lucretia Roers, RN, BSN, [place] [phone] Pt to D/C home today with [facility] services.  DME identified includes wheelchair.  Shaune Leeks of [facility] will deliver to pt room prior to pt d/c home.  09/26/14 Kaoru Rezendes J. Lucretia Roers, RN, BSN, [place] [phone] Pt currently active with [facility] for services. Resumption of care requested.  Ayesha Rumpf, RN of [facility] notified.  No DME needs identified  at this time.

## 2014-09-27 ENCOUNTER — Ambulatory Visit: Payer: Medicare Other

## 2014-09-27 DIAGNOSIS — E876 Hypokalemia: Secondary | ICD-10-CM

## 2014-09-27 DIAGNOSIS — E870 Hyperosmolality and hypernatremia: Secondary | ICD-10-CM

## 2014-09-27 LAB — BASIC METABOLIC PANEL
BUN: 12 mg/dL (ref 6–23)
CALCIUM: 10.1 mg/dL (ref 8.4–10.5)
CREATININE: 0.94 mg/dL (ref 0.50–1.10)
Chloride: 101 mEq/L (ref 96–112)
GFR calc Af Amer: 56 mL/min — ABNORMAL LOW (ref >90)
GFR calc non Af Amer: 48 mL/min — ABNORMAL LOW (ref >90)
Glucose, Bld: 104 mg/dL — ABNORMAL HIGH (ref 70–99)
Potassium: 3.4 mEq/L — ABNORMAL LOW (ref 3.7–5.3)
Sodium: 153 mEq/L — ABNORMAL HIGH (ref 137–147)

## 2014-09-27 MED ORDER — POTASSIUM CHLORIDE 20 MEQ/15ML (10%) PO LIQD
40.0000 meq | Freq: Every day | ORAL | Status: DC
  Administered 2014-09-27 – 2014-09-29 (×3): 40 meq via ORAL
  Filled 2014-09-27 (×3): qty 30

## 2014-09-27 MED ORDER — FUROSEMIDE 40 MG PO TABS
40.0000 mg | ORAL_TABLET | Freq: Every day | ORAL | Status: DC
  Administered 2014-09-27 – 2014-09-29 (×3): 40 mg via ORAL
  Filled 2014-09-27 (×3): qty 1

## 2014-09-27 MED ORDER — ASPIRIN 81 MG PO CHEW
81.0000 mg | CHEWABLE_TABLET | Freq: Every day | ORAL | Status: DC
  Administered 2014-09-27 – 2014-09-29 (×3): 81 mg via ORAL
  Filled 2014-09-27 (×2): qty 1

## 2014-09-27 NOTE — Progress Notes (Signed)
Physical Therapy Treatment Patient Details Name: Jessica [name] MRN: [id number] DOB: 09-02-1911 Today's Date: 09/27/2014    History of Present Illness HPI: Jessica [name] is a 80 y.o. female with PMH of hypertension, CHF, hx of STEMI,hyperlipidemia, GERD, who presents with SOB; recent admission 3 wks ago with MI    PT Comments    Pt progressing well towards physical therapy goals. Was able to perform transfers and ambulation with min guard assist/supervision. Feel pt could have ambulated farther, and did well with balance while using RW. Will continue to follow and progress as able per POC.   Follow Up Recommendations  Supervision/Assistance - 24 hour;Other (comment) (For dc home, recommend 24 hour assist, which son indicated he can provide; will recommend re-starting HHPT, HHOT, HHRN; Does pt qualify for an aide?)     Equipment Recommendations  Wheelchair (measurements PT);Wheelchair cushion (measurements PT);3in1 (PT)    Recommendations for Other Services OT consult     Precautions / Restrictions Precautions Precautions: Fall Restrictions Weight Bearing Restrictions: No    Mobility  Bed Mobility Overal bed mobility: Needs Assistance Bed Mobility: Supine to Sit     Supine to sit: Supervision     General bed mobility comments: Close supervision for safety, however pt was able to complete transfer to sitting without physical assist. Increased time and use of bed rails required.   Transfers Overall transfer level: Needs assistance Equipment used: Rolling walker (2 wheeled) Transfers: Sit to/from Stand Sit to Stand: Min guard         General transfer comment: Pt was able to power-up to stand with increased time and no physical assist. Pt was cued to gain balance prior to intiating ambulation.   Ambulation/Gait Ambulation/Gait assistance: Min guard Ambulation Distance (Feet): 15 Feet Assistive device: Rolling walker (2 wheeled) Gait Pattern/deviations: Step-through  pattern;Decreased stride length;Trunk flexed Gait velocity: decreased Gait velocity interpretation: Below normal speed for age/gender General Gait Details: Occasional cueing for general safety awareness. Overall pt did well with no unsteadiness noted (while using the RW).   Stairs            Wheelchair Mobility    Modified Rankin (Stroke Patients Only)       Balance Overall balance assessment: Needs assistance Sitting-balance support: Feet supported;No upper extremity supported Sitting balance-Leahy Scale: Fair     Standing balance support: Bilateral upper extremity supported;During functional activity Standing balance-Leahy Scale: Poor Standing balance comment: Feel pt requires UE support with dynamic standing activities.                     Cognition Arousal/Alertness: Awake/alert Behavior During Therapy: WFL for tasks assessed/performed Overall Cognitive Status: Within Functional Limits for tasks assessed                      Exercises General Exercises - Lower Extremity Ankle Circles/Pumps: 15 reps Quad Sets: 20 reps Long Arc Quad: 15 reps Hip ABduction/ADduction: 15 reps    General Comments        Pertinent Vitals/Pain Pain Assessment: No/denies pain    Home Living                      Prior Function            PT Goals (current goals can now be found in the care plan section) Acute Rehab PT Goals Patient Stated Goal: home PT Goal Formulation: With patient Time For Goal Achievement: 10/09/14 Potential to Achieve Goals: Fair Progress  towards PT goals: Progressing toward goals    Frequency  Min 3X/week    PT Plan Current plan remains appropriate    Co-evaluation             End of Session Equipment Utilized During Treatment: Gait belt Activity Tolerance: Patient tolerated treatment well Patient left: in chair;with call bell/phone within reach;with nursing/sitter in room     Time: 7681-1572 PT Time  Calculation (min): 29 min  Charges:  $Gait Training: 8-22 mins $Therapeutic Exercise: 8-22 mins                    G Codes:      Conni Slipper 10-09-2014, 5:40 PM  Conni Slipper, PT, DPT Acute Rehabilitation Services Pager: [phone]

## 2014-09-27 NOTE — Consult Note (Signed)
Patient KD:XIPJA Donnellan      DOB: 12-22-1910      SNK:[id number]     Consult Note from the Palliative Medicine Team at [facility] Requested by:  Dr. Tyrell Antonio     PCP: Noralee Space, MD Reason for Consultation: [place]    Phone [phone] Related symptom rec Assessment of patients Current state: 80 yr old african [place] female in no distress admitted with shortness of breath found to be in decompensated heart failure and bradycardia.  It was further found that she has dysphagia.  I met with the patient , her daughter Caren Griffins, and her son [name].  Patient and family were able to articulate her goals as follows: continue to treat treatable issuses with return to hospital if needed.  No feeding tubes- feed best texture and treat pneumonia for now. Attempt to rehab so that she can go back home and be with her husband. See my note from the day of service.     Goals of Care: 1.  Code Status: ,  MOST form provided to children to review they did not want to fill it out at present.   2. Scope of Treatment: Continue to treat the treatable acute and chronic illness.    4. Disposition: rehab then possibly home   3. Symptom Management:   1. Anxiety/Agitation: uses prn alprazolam 2. Pain:not an issue. Avoid sedating medications which can worsen swallowing 3.  Tail bone pain: air mattress overlay, and mobilize patient 4.  Shortness of breath- maximize medical therapy 5.  Dysphagia- modify diet to best texture, no feeding tubes 6.  Hyponatremia- agree likely contraction alkalosis, medical management   4. Psychosocial: Patient was a Education officer, museum.  She has a spouse at home who is also frail. She wants to "stick around " for him, two children are helping her out with day to day decisions.  5. Spiritual: Ebbie [place], chaplain services offered.       Patient Documents Completed or Given: Document Given Completed  Advanced Directives Pkt    MOST x   DNR    Gone  from My [facility]ight    Hard Choices      Brief HPI: 80 yr old african [place] female admitted with recurrent CHF. We were asked to assist with goals of care.   ROS: tail bone pain, denies nausea, vomiting. Eyes with discharge chronically had been taking a drop    PMH:  Past Medical History  Diagnosis Date  . Allergy, unspecified not elsewhere classified   . Hypertension   . Palpitations   . Chronic venous insufficiency   . Hypercholesteremia   . GERD (gastroesophageal reflux disease)   . Diverticulosis of colon   . History of urinary tract infection   . Urinary frequency   . Urinary incontinence   . Osteoporosis   . Neuropathy   . Anxiety   . History of shingles   . Heart murmur   . Stroke     "evidence of light stroke/Dr Lenna Gilford recently" (09/23/2014)  . DJD (degenerative joint disease)      PSH: Past Surgical History  Procedure Laterality Date  . Vesicovaginal fistula closure w/ tah    . Tonsillectomy    . Abdominal hysterectomy     I have reviewed the FH and SH and  If appropriate update it with new information. No Known Allergies Scheduled Meds: . aspirin  81 mg Oral Daily  . clopidogrel  75 mg Oral Q breakfast  . feeding  supplement (ENSURE COMPLETE)  237 mL Oral BID BM  . furosemide  40 mg Oral Daily  . gabapentin  100 mg Oral TID  . heparin  5,000 Units Subcutaneous 3 times per day  . Influenza vac split quadrivalent PF  0.5 mL Intramuscular Tomorrow-1000  . lisinopril  5 mg Oral Daily  . multivitamin with minerals  1 tablet Oral Daily  . oxybutynin  2.5 mg Oral BID  . phosphorus  250 mg Oral BID  . potassium chloride  40 mEq Oral Daily  . sodium chloride  3 mL Intravenous Q12H  . sodium chloride  3 mL Intravenous Q12H   Continuous Infusions:  PRN Meds:.sodium chloride, ALPRAZolam, nitroGLYCERIN, RESOURCE THICKENUP CLEAR, sodium chloride    BP 122/49  Pulse 60  Temp(Src) 97.9 F (36.6 C) (Oral)  Resp 18  Ht 5' (1.524 m)  Wt 50.8 kg (111 lb 15.9  oz)  BMI 21.87 kg/m2  SpO2 100%   PPS: 40-50%   Intake/Output Summary (Last 24 hours) at 09/27/14 1336 Last data filed at 09/27/14 1013  Gross per 24 hour  Intake    846 ml  Output    350 ml  Net    496 ml    Physical Exam:  General: No acute distress, cognitively seems intact, able to weigh pros and cons although she carries a diagnosis of dementia. HEENT:  PERRL, EOMI, anicteric, mmm, mild mucoid material R>L eye, no conjunctival injection Chest:   Decreased with some upper airway rhonchi which clear with cough, no wheezing CVS: RRR, 1/6 SEM, no rubs or gallops Abdomen:soft, not tender or distended Ext:1+-2 edema of lower extremities Neuro:oriented to her self and the past , short term memory spotty but seems as if she is doing ok with events of the day. She understands her illness is chronic and was able to participate in goals of care.  Labs: CBC    Component Value Date/Time   WBC 6.2 09/23/2014 1700   RBC 4.52 09/23/2014 1700   RBC 4.13 10/10/2012 1456   HGB 12.8 09/23/2014 1700   HCT 40.6 09/23/2014 1700   PLT 311 09/23/2014 1700   MCV 89.8 09/23/2014 1700   MCH 28.3 09/23/2014 1700   MCHC 31.5 09/23/2014 1700   RDW 14.9 09/23/2014 1700   LYMPHSABS 0.5* 09/23/2014 1700   MONOABS 0.5 09/23/2014 1700   EOSABS 0.0 09/23/2014 1700   BASOSABS 0.0 09/23/2014 1700      CMP     Component Value Date/Time   NA 153* 09/27/2014 0430   K 3.4* 09/27/2014 0430   CL 101 09/27/2014 0430   CO2 >45* 09/27/2014 0430   GLUCOSE 104* 09/27/2014 0430   GLUCOSE 75 11/17/2006 1228   BUN 12 09/27/2014 0430   CREATININE 0.94 09/27/2014 0430   CALCIUM 10.1 09/27/2014 0430   CALCIUM 11.2* 03/23/2013 1517   PROT 6.7 09/16/2014 1030   ALBUMIN 3.2* 09/16/2014 1030   AST 36 09/16/2014 1030   ALT 36* 09/16/2014 1030   ALKPHOS 66 09/16/2014 1030   BILITOT 0.2* 09/16/2014 1030   GFRNONAA 48* 09/27/2014 0430   GFRAA 56* 09/27/2014 0430    Chest Xray Reviewed/Impressions: 1. Improved aeration since  the prior studies. 2. Persistent bilateral pleural effusions. 3. Artifact versus fissure fluid on the left. See above.      Time In Time Out Total Time Spent with Patient Total Overall Time  1130 pm 1215 pm 45 min 45 min     Greater than 50%  of  this time was spent counseling and coordinating care related to the above assessment and plan.  Jaylianna Tatlock L. Lovena Le, MD MBA The Palliative Medicine Team at [facility] Phone: [phone] Pager: [phone] ( Use team phone after hours)

## 2014-09-27 NOTE — Consult Note (Signed)
Patient Jessica [name]      DOB: 1911-03-25      DTO:[id number]  Summary of Goals of Care; full note to follow:  Met with Daughter Jessica [name], and son Jessica [name] and patient.   Patient despite her age is cognitively intact and able to talk about her current condition.  She has expressed her wishes through an advanced directive and communications with her children who are actively involved in her care. She has a spouse at home who is elderly but not as in as ill health as she is.  She confirms her DNR status. She and her family express that their desires are to continue to treat and medically manage her chronic conditions with return to the hospital if necessary.  They have expressed concern over her having possibly experienced a stroke to account for her swallowing issue.  Her CT and MRI were negative on 10/2.  The families overall goals are to focus on cure with comfort especially while her husband is at home and doing fairly well.  We talked a little about how hospice could play a role at anytime the dilemma will be provding enough care for her to be at home 24/7   Recommend: 1.Dnr  2. Heart failure: maximize medical therapy , consider heart failure team consult if this will give her any advantage with medical management  3.  Get her mobilizing  4.  Intensive Social "Work related to placement- she has used up her days?   Total time:  1130 pm- 1215 pm   Baldo Hufnagle L. Lovena Le, MD MBA The Palliative Medicine Team at [facility] Phone: [phone] Pager: [phone] ( Use team phone after hours)

## 2014-09-27 NOTE — Telephone Encounter (Signed)
Per SN---  The family will need to contact the social worker from the hospital to help them set up placement in the nursing home.    i called and spoke with cynthia's daughter and she is aware of this.  She stated that they are in a meeting at the hospital now to see about what type of placement the pt will need. Nothing further is needed.

## 2014-09-27 NOTE — Clinical Documentation Improvement (Signed)
MD's, NP's, and PA's  Patient admitting diagnosis is "CHF exacerbation" please document the type if known and appropriate for this admission. Thank you   Possible Clinical Conditions?  Acute Systolic Congestive Heart Failure  Acute Diastolic Congestive Heart Failure  Acute Systolic & Diastolic Congestive Heart Failure  Acute on Chronic Systolic Congestive Heart Failure  Acute on Chronic Diastolic Congestive Heart Failure  Acute on Chronic Systolic & Diastolic Congestive Heart Failure  Other Condition  Cannot Clinically Determine   Thank You, Lavonda Jumbo ,RN Clinical Documentation Specialist:  [phone]  [facility]- Health Information Management

## 2014-09-27 NOTE — Progress Notes (Addendum)
[facility] PROGRESS NOTE  Sunita Faden DDU:[id number] DOB: 1911-08-16 DOA: 09/23/2014 PCP: [name], MD   Brief narrative 80 y.o. female with PMH of hypertension, CHF, hx of STEMI,hyperlipidemia, GERD, who presents with SOB  Patient was recently hospitalized and treated for congestive heart failure. On the day of admission she had acute onset of SOB.  She also had very mild dry cough. She did not have other symptoms, including fever, chills, chest pain, nausea, vomiting, abdominal pain, diarrhea, rashes.  EMS was called and found the patient has oxygen desaturation to 84%. She was brought to the hospital for further evaluation and treatment.  She was found to have negative troponin, but elevated proBNP at 9280. Chest x-ray showed pulmonary edema.   Assessment/Plan: CHF exacerbation:  -Clinically improved with diuresis. Urine output not clearly monitored ( possibly due to incontinence) -continue home dose ASA, Lisinopril.  - holding  her Metoprolol given  bradycardia with heart rate as low as 30s. -reduce Lasix dose to 40 mg once a day ( had increased to 40 mg iv bid) as patient is retaining CO2 and Na elevated. -Monitor labs in a.m. -d/c  telemetry   HTN:  - continue home lisinopril and hold metoprolol   CAD:  - Continue ASA  - When necessary nitroglycerin    hx of TIA:  on ASA and plavix   CKD-III:  Stable  hyernateremia and hypercarbia  possibly from contraction alkalosis with over diuresis. Lasix dose reduced. Monitor labs in am  Hypokalemia replenish   Dysphagia  patient failed swallow eval with high risk for aspiration. Discuss in detail with her son at bedside. He doesnot wish for any feeding tube and says the patient never wanted to have one. He would like her to receive comfort feeding if possible. he was explained the risk of aspiration associated with it but still prefers she be allowed to eat.  dys level 1 diet . May advance for comfort feeds .Give  meds crushed in puree  -goals of care meeting with palliative care pending.   DVT prophylaxis: SQ Heparin        Code Status: DNR Family Communication: Will update her son  Disposition Plan: Possibly home on 10/14 after goals of care discussed and repeat labs stable   Consultants:  none  Procedures:  none  Antibiotics:  none  HPI/Subjective: Patient denies any dyspnea today.  Objective: Filed Vitals:   09/27/14 0611  BP: 149/47  Pulse: 46  Temp: 97.9 F (36.6 C)  Resp: 18    Intake/Output Summary (Last 24 hours) at 09/27/14 0849 Last data filed at 09/27/14 0657  Gross per 24 hour  Intake    720 ml  Output    350 ml  Net    370 ml   Filed Weights   09/25/14 0609 09/26/14 0654 09/27/14 0611  Weight: 59.2 kg (130 lb 8.2 oz) 55.3 kg (121 lb 14.6 oz) 50.8 kg (111 lb 15.9 oz)    Exam:   General:  NAD,    HEENT: moist mucosa  Cardiovascular: S1 &S2 normal, no murmurs  Respiratory: clear breath sounds at bases  Abdomen: soft, NT, ND, BS+  EXT: trace edema b/l   CNS: alert and oriented   Data Reviewed: Basic Metabolic Panel:  Recent Labs Lab 09/23/14 1700 09/24/14 0351 09/27/14 0430  NA 142 145 153*  K 4.6 3.9 3.4*  CL 102 101 101  CO2 34* 35* >45*  GLUCOSE 99 97 104*  BUN 15 15 12  CREATININE 1.05 1.17* 0.94  CALCIUM 10.2 10.0 10.1  MG  --  2.4  --    Liver Function Tests: No results found for this basename: AST, ALT, ALKPHOS, BILITOT, PROT, ALBUMIN,  in the last 168 hours No results found for this basename: LIPASE, AMYLASE,  in the last 168 hours No results found for this basename: AMMONIA,  in the last 168 hours CBC:  Recent Labs Lab 09/23/14 1700  WBC 6.2  NEUTROABS 5.1  HGB 12.8  HCT 40.6  MCV 89.8  PLT 311   Cardiac Enzymes:  Recent Labs Lab 09/23/14 2220 09/24/14 0351 09/24/14 0930  TROPONINI <0.30 <0.30 <0.30   BNP (last 3 results)  Recent Labs  09/23/14 1700  PROBNP 9280.0*   CBG: No results  found for this basename: GLUCAP,  in the last 168 hours  Recent Results (from the past 240 hour(s))  URINE CULTURE     Status: None   Collection Time    09/23/14  4:42 PM      Result Value Ref Range Status   Specimen Description URINE, CATHETERIZED   Final   Special Requests NONE   Final   Culture  Setup Time     Final   Value: 09/23/2014 22:14     Performed at [facility]   Colony Count     Final   Value: NO GROWTH     Performed at [facility]   Culture     Final   Value: NO GROWTH     Performed at [facility]   Report Status 09/24/2014 FINAL   Final     Studies: No results found.  Scheduled Meds: . aspirin EC  81 mg Oral Daily  . clopidogrel  75 mg Oral Q breakfast  . feeding supplement (ENSURE COMPLETE)  237 mL Oral BID BM  . furosemide  40 mg Intravenous Q12H  . furosemide  40 mg Oral Daily  . gabapentin  100 mg Oral TID  . heparin  5,000 Units Subcutaneous 3 times per day  . Influenza vac split quadrivalent PF  0.5 mL Intramuscular Tomorrow-1000  . lisinopril  5 mg Oral Daily  . multivitamin with minerals  1 tablet Oral Daily  . oxybutynin  2.5 mg Oral BID  . phosphorus  250 mg Oral BID  . sodium chloride  3 mL Intravenous Q12H  . sodium chloride  3 mL Intravenous Q12H   Continuous Infusions:     Time spent: 25 MINUTES    Jasie Meleski  [facility] Pager [phone]. If 7PM-7AM, please contact night-coverage at [email or web address], password [email or web address] 09/27/2014, 8:49 AM  LOS: 4 days

## 2014-09-27 NOTE — Progress Notes (Signed)
Report given to receiving RN. Patient sleeping in recliner with family at bedside. No signs or symptoms of distress or discomfort.

## 2014-09-28 DIAGNOSIS — R0602 Shortness of breath: Secondary | ICD-10-CM

## 2014-09-28 DIAGNOSIS — H01003 Unspecified blepharitis right eye, unspecified eyelid: Secondary | ICD-10-CM

## 2014-09-28 DIAGNOSIS — H01006 Unspecified blepharitis left eye, unspecified eyelid: Secondary | ICD-10-CM

## 2014-09-28 DIAGNOSIS — Z515 Encounter for palliative care: Secondary | ICD-10-CM

## 2014-09-28 LAB — BASIC METABOLIC PANEL
ANION GAP: 7 (ref 5–15)
BUN: 11 mg/dL (ref 6–23)
CALCIUM: 10.6 mg/dL — AB (ref 8.4–10.5)
CO2: 43 meq/L — AB (ref 19–32)
Chloride: 99 mEq/L (ref 96–112)
Creatinine, Ser: 0.89 mg/dL (ref 0.50–1.10)
GFR calc Af Amer: 60 mL/min — ABNORMAL LOW (ref >90)
GFR calc non Af Amer: 51 mL/min — ABNORMAL LOW (ref >90)
Glucose, Bld: 92 mg/dL (ref 70–99)
Potassium: 3.8 mEq/L (ref 3.7–5.3)
Sodium: 149 mEq/L — ABNORMAL HIGH (ref 137–147)

## 2014-09-28 MED ORDER — HYPROMELLOSE (GONIOSCOPIC) 2.5 % OP SOLN
1.0000 [drp] | Freq: Three times a day (TID) | OPHTHALMIC | Status: DC
  Filled 2014-09-28: qty 15

## 2014-09-28 MED ORDER — BISACODYL 5 MG PO TBEC
5.0000 mg | DELAYED_RELEASE_TABLET | Freq: Once | ORAL | Status: AC
  Administered 2014-09-28: 5 mg via ORAL
  Filled 2014-09-28: qty 1

## 2014-09-28 MED ORDER — POLYVINYL ALCOHOL 1.4 % OP SOLN
1.0000 [drp] | Freq: Three times a day (TID) | OPHTHALMIC | Status: DC
  Administered 2014-09-29: 1 [drp] via OPHTHALMIC
  Filled 2014-09-28 (×2): qty 15

## 2014-09-28 NOTE — Progress Notes (Addendum)
Patient Jessica [name]      DOB: [birth year]      YEB:[id number]  [name] at beside.  Patient looks and feels better than yesterday, sodium slightly improved.  Patient very frail.  I spoke with SW regarding need for discharge planning. Family not sure they can provide care at home and need to know her options since she has used a lot of her insurance days in the last few hospital stays.   Exam: Remains with some irritation of the upper and lower eyelids and mucoid material in corner of her eyes   Continuing to support them as they make day to day decisions.  Dr. Greig Right will be available but shadowing for the rest of the week.   Nery Kalisz L. Ladona Ridgel, MD MBA The Palliative Medicine Team at [facility] Phone: [phone] Pager: [phone] ( Use team phone after hours)   Addendum:  Patient has been treated for blepharitis is last few weeks. She has no signs of conjunctivitis, or rhino sinusitis.  Spoke with her optho office. They were treating with soothe (artificial tears for dry eye) and good eye cleaning. We can use some baby shampoo twice daily with warm water to wash eyes.   Miliano Cotten L. Ladona Ridgel, MD MBA The Palliative Medicine Team at [facility] Phone: [phone] Pager: [phone] ( Use team phone after hours)

## 2014-09-28 NOTE — Progress Notes (Signed)
[facility] PROGRESS NOTE  Jessica [name] MHD:[id number] DOB: 03-06-1911 DOA: 09/23/2014 PCP: [name], MD   Brief narrative 80 y.o. female with PMH of hypertension, CHF, hx of STEMI,hyperlipidemia, GERD, who presents with SOB  Patient was recently hospitalized and treated for congestive heart failure. On the day of admission she had acute onset of SOB.  She also had very mild dry cough. She did not have other symptoms, including fever, chills, chest pain, nausea, vomiting, abdominal pain, diarrhea, rashes.  EMS was called and found the patient has oxygen desaturation to 84%. She was brought to the hospital for further evaluation and treatment.  She was found to have negative troponin, but elevated proBNP at 9280. Chest x-ray showed pulmonary edema.   Assessment/Plan: Acute on chronic systolic and diastolic CHF exacerbation:  -Clinically improved with diuresis. Urine output not clearly monitored ( possibly due to incontinence) -continue home dose ASA, Lisinopril.  - holding  her Metoprolol given  bradycardia with heart rate as low as 30s. -Continue with  Lasix dose to 40 mg once a day ( had increased to 40 mg iv bid) as patient is retaining CO2 and Na elevated. -Monitor labs in a.m. -d/c  telemetry  -Weight: 59---55--50---53  HTN:  - continue home lisinopril and hold metoprolol   CAD:  - Continue ASA  - When necessary nitroglycerin    hx of TIA:  on ASA and plavix   CKD-III:  Stable  hyernateremia and hypercarbia  possibly from contraction alkalosis with over diuresis. Lasix dose reduced.  Sodium level decrease. Repeat in AM and adjust lasix as needed.   Hypokalemia replenish  Dysphagia  Dys level 1 diet . May advance for comfort feeds .Give meds crushed in puree  Patient failed swallow eval with high risk for aspiration. Dr Gonzella Lex Discussed  in detail with her son.  He doesnot wish for any feeding tube and says the patient never wanted to have one. He would  like her to receive comfort feeding if possible. he was explained the risk of aspiration associated with it but still prefers she be allowed to eat.    DVT prophylaxis: SQ Heparin     Code Status: DNR Family Communication: discussed with patient.   Disposition Plan: patient with concern that family wont be able to take care of her at home. SW consulted for SNF.    Consultants:  Palliative.   Procedures:  none  Antibiotics:  none  HPI/Subjective: Patient denies any dyspnea today. Feeling well.   Objective: Filed Vitals:   09/28/14 1349  BP: 130/50  Pulse: 54  Temp: 98.6 F (37 C)  Resp: 16    Intake/Output Summary (Last 24 hours) at 09/28/14 1615 Last data filed at 09/28/14 1348  Gross per 24 hour  Intake    660 ml  Output      0 ml  Net    660 ml   Filed Weights   09/26/14 0654 09/27/14 0611 09/28/14 0509  Weight: 55.3 kg (121 lb 14.6 oz) 50.8 kg (111 lb 15.9 oz) 53.252 kg (117 lb 6.4 oz)    Exam:   General:  NAD,    HEENT: moist mucosa  Cardiovascular: S1 &S2 normal, no murmurs  Respiratory: clear breath sounds at bases  Abdomen: soft, NT, ND, BS+  EXT: trace edema b/l   CNS: alert and oriented   Data Reviewed: Basic Metabolic Panel:  Recent Labs Lab 09/23/14 1700 09/24/14 0351 09/27/14 0430 09/28/14 0813  NA 142 145 153* 149*  K 4.6 3.9 3.4* 3.8  CL 102 101 101 99  CO2 34* 35* >45* 43*  GLUCOSE 99 97 104* 92  BUN 15 15 12 11   CREATININE 1.05 1.17* 0.94 0.89  CALCIUM 10.2 10.0 10.1 10.6*  MG  --  2.4  --   --    Liver Function Tests: No results found for this basename: AST, ALT, ALKPHOS, BILITOT, PROT, ALBUMIN,  in the last 168 hours No results found for this basename: LIPASE, AMYLASE,  in the last 168 hours No results found for this basename: AMMONIA,  in the last 168 hours CBC:  Recent Labs Lab 09/23/14 1700  WBC 6.2  NEUTROABS 5.1  HGB 12.8  HCT 40.6  MCV 89.8  PLT 311   Cardiac Enzymes:  Recent Labs Lab  09/23/14 2220 09/24/14 0351 09/24/14 0930  TROPONINI <0.30 <0.30 <0.30   BNP (last 3 results)  Recent Labs  09/23/14 1700  PROBNP 9280.0*   CBG: No results found for this basename: GLUCAP,  in the last 168 hours  Recent Results (from the past 240 hour(s))  URINE CULTURE     Status: None   Collection Time    09/23/14  4:42 PM      Result Value Ref Range Status   Specimen Description URINE, CATHETERIZED   Final   Special Requests NONE   Final   Culture  Setup Time     Final   Value: 09/23/2014 22:14     Performed at [facility]   Colony Count     Final   Value: NO GROWTH     Performed at [facility]   Culture     Final   Value: NO GROWTH     Performed at [facility]   Report Status 09/24/2014 FINAL   Final     Studies: No results found.  Scheduled Meds: . aspirin  81 mg Oral Daily  . bisacodyl  5 mg Oral Once  . clopidogrel  75 mg Oral Q breakfast  . feeding supplement (ENSURE COMPLETE)  237 mL Oral BID BM  . furosemide  40 mg Oral Daily  . gabapentin  100 mg Oral TID  . heparin  5,000 Units Subcutaneous 3 times per day  . hydroxypropyl methylcellulose / hypromellose  1 drop Both Eyes TID  . Influenza vac split quadrivalent PF  0.5 mL Intramuscular Tomorrow-1000  . lisinopril  5 mg Oral Daily  . multivitamin with minerals  1 tablet Oral Daily  . oxybutynin  2.5 mg Oral BID  . phosphorus  250 mg Oral BID  . potassium chloride  40 mEq Oral Daily  . sodium chloride  3 mL Intravenous Q12H  . sodium chloride  3 mL Intravenous Q12H   Continuous Infusions:     Time spent: 25 MINUTES    Wandalee Klang A  [facility] Pager [phone]. If 7PM-7AM, please contact night-coverage at [email or web address], password [email or web address] 09/28/2014, 4:15 PM  LOS: 5 days

## 2014-09-29 DIAGNOSIS — I5043 Acute on chronic combined systolic (congestive) and diastolic (congestive) heart failure: Secondary | ICD-10-CM | POA: Diagnosis not present

## 2014-09-29 DIAGNOSIS — G309 Alzheimer's disease, unspecified: Secondary | ICD-10-CM | POA: Diagnosis not present

## 2014-09-29 DIAGNOSIS — G603 Idiopathic progressive neuropathy: Secondary | ICD-10-CM | POA: Diagnosis not present

## 2014-09-29 DIAGNOSIS — N183 Chronic kidney disease, stage 3 (moderate): Secondary | ICD-10-CM | POA: Diagnosis not present

## 2014-09-29 DIAGNOSIS — E780 Pure hypercholesterolemia: Secondary | ICD-10-CM | POA: Diagnosis not present

## 2014-09-29 DIAGNOSIS — G459 Transient cerebral ischemic attack, unspecified: Secondary | ICD-10-CM | POA: Diagnosis not present

## 2014-09-29 DIAGNOSIS — R262 Difficulty in walking, not elsewhere classified: Secondary | ICD-10-CM | POA: Diagnosis not present

## 2014-09-29 DIAGNOSIS — M6281 Muscle weakness (generalized): Secondary | ICD-10-CM | POA: Diagnosis not present

## 2014-09-29 DIAGNOSIS — E876 Hypokalemia: Secondary | ICD-10-CM | POA: Diagnosis not present

## 2014-09-29 DIAGNOSIS — I10 Essential (primary) hypertension: Secondary | ICD-10-CM | POA: Diagnosis not present

## 2014-09-29 DIAGNOSIS — E870 Hyperosmolality and hypernatremia: Secondary | ICD-10-CM | POA: Diagnosis not present

## 2014-09-29 DIAGNOSIS — R001 Bradycardia, unspecified: Secondary | ICD-10-CM | POA: Diagnosis not present

## 2014-09-29 DIAGNOSIS — K219 Gastro-esophageal reflux disease without esophagitis: Secondary | ICD-10-CM | POA: Diagnosis not present

## 2014-09-29 DIAGNOSIS — F419 Anxiety disorder, unspecified: Secondary | ICD-10-CM | POA: Diagnosis not present

## 2014-09-29 DIAGNOSIS — I5042 Chronic combined systolic (congestive) and diastolic (congestive) heart failure: Secondary | ICD-10-CM

## 2014-09-29 DIAGNOSIS — I69991 Dysphagia following unspecified cerebrovascular disease: Secondary | ICD-10-CM | POA: Diagnosis not present

## 2014-09-29 LAB — BASIC METABOLIC PANEL
Anion gap: 9 (ref 5–15)
BUN: 12 mg/dL (ref 6–23)
CO2: 40 meq/L — AB (ref 19–32)
Calcium: 10.8 mg/dL — ABNORMAL HIGH (ref 8.4–10.5)
Chloride: 99 mEq/L (ref 96–112)
Creatinine, Ser: 0.96 mg/dL (ref 0.50–1.10)
GFR calc Af Amer: 54 mL/min — ABNORMAL LOW (ref >90)
GFR calc non Af Amer: 47 mL/min — ABNORMAL LOW (ref >90)
Glucose, Bld: 85 mg/dL (ref 70–99)
Potassium: 3.8 mEq/L (ref 3.7–5.3)
SODIUM: 148 meq/L — AB (ref 137–147)

## 2014-09-29 MED ORDER — FUROSEMIDE 40 MG PO TABS: 40.0000 mg | ORAL_TABLET | Freq: Every day | ORAL | Status: DC

## 2014-09-29 MED ORDER — FLEET ENEMA 7-19 GM/118ML RE ENEM
1.0000 | ENEMA | Freq: Once | RECTAL | Status: DC
Start: 2014-09-29 — End: 2014-09-29
  Filled 2014-09-29: qty 1

## 2014-09-29 NOTE — Progress Notes (Signed)
  Case reviewed with [name], HF Navigator.  EF 45-50% due to ischemic CM. Now with recurrent HF admission.   Unable to tolerate b-blocker due to bradycardia. On low-dose ACE-I. Volume status much improved with lasix. Has been seen by Palliative Care.   Main issue now is making sure we manage her volume status adequately as an outpatient to keep her dry but avoid overdiuresis. Agree with [name] and having [name], RN follow closely through their program. We will assist from HF Clinic as needed.   Neely Kammerer,MD 11:55 AM

## 2014-09-29 NOTE — Progress Notes (Signed)
Physical Therapy Treatment Patient Details Name: Jessica [name] MRN: [id number] DOB: [birth year] Today's Date: 09/29/2014    History of Present Illness HPI: Jessica [name] is a 80 y.o. female with PMH of hypertension, CHF, hx of STEMI,hyperlipidemia, GERD, who presents with SOB; recent admission 3 wks ago with MI    PT Comments    Pt progressing towards physical therapy goals. During session, discussed pt's d/c plan and pt states she is not comfortable returning home at d/c and would like to enter rehab in the SNF setting to increase her independence prior to returning home. Pt progressing well towards physical therapy goals, however agree that pt will require some additional post-acute rehab before home as pt is a fall risk. D/c plan and PT frequency have been updated to reflect this.   Follow Up Recommendations  Supervision/Assistance - 24 hour;SNF     Equipment Recommendations       Recommendations for Other Services OT consult     Precautions / Restrictions Precautions Precautions: Fall Restrictions Weight Bearing Restrictions: No    Mobility  Bed Mobility Overal bed mobility: Needs Assistance Bed Mobility: Supine to Sit     Supine to sit: Min assist     General bed mobility comments: Assist required for trunk elevation to full sitting position, as well as to scoot to EOB due to air mattress "lump" around edge. If mattress was flat do not anticipate that pt would need assist.   Transfers Overall transfer level: Needs assistance Equipment used: Rolling walker (2 wheeled) Transfers: Sit to/from Stand Sit to Stand: Min guard         General transfer comment: Pt was able to power-up to stand with increased time and no physical assist. Pt was cued to gain balance prior to intiating ambulation.   Ambulation/Gait Ambulation/Gait assistance: Min guard Ambulation Distance (Feet): 50 Feet Assistive device: Rolling walker (2 wheeled) Gait Pattern/deviations:  Step-through pattern;Decreased stride length;Trunk flexed;Narrow base of support Gait velocity: Decreased Gait velocity interpretation: Below normal speed for age/gender General Gait Details: Occasional cueing for general safety awareness. Overall pt did well with no unsteadiness noted (while using the RW). Pt ambulated on RA and sats remained >90%.    Stairs            Wheelchair Mobility    Modified Rankin (Stroke Patients Only)       Balance Overall balance assessment: Needs assistance Sitting-balance support: Feet supported;No upper extremity supported Sitting balance-Leahy Scale: Fair     Standing balance support: Bilateral upper extremity supported Standing balance-Leahy Scale: Poor Standing balance comment: Feel pt requires UE support with dynamic standing activities.                     Cognition Arousal/Alertness: Awake/alert Behavior During Therapy: WFL for tasks assessed/performed Overall Cognitive Status: Within Functional Limits for tasks assessed                      Exercises      General Comments        Pertinent Vitals/Pain Pain Assessment: No/denies pain    Home Living                      Prior Function            PT Goals (current goals can now be found in the care plan section) Acute Rehab PT Goals Patient Stated Goal: home PT Goal Formulation: With patient Time For Goal Achievement: 10/09/14  Potential to Achieve Goals: Fair Progress towards PT goals: Progressing toward goals    Frequency  Min 2X/week    PT Plan Discharge plan needs to be updated;Frequency needs to be updated    Co-evaluation             End of Session Equipment Utilized During Treatment: Gait belt;Oxygen Activity Tolerance: Patient tolerated treatment well Patient left: in chair;with call bell/phone within reach;with chair alarm set     Time: 4401-02720928-1004 PT Time Calculation (min): 36 min  Charges:  $Gait Training: 8-22  mins $Therapeutic Activity: 8-22 mins                    G Codes:      Conni SlipperKirkman, Shaurya Rawdon 09/29/2014, 1:04 PM  Conni SlipperLaura Mikayela Deats, PT, DPT Acute Rehabilitation Services Pager: [phone]

## 2014-09-29 NOTE — Clinical Social Work Placement (Signed)
     Clinical Social Work Department CLINICAL SOCIAL WORK PLACEMENT NOTE 09/29/2014  Patient:  Jessica [name], Jessica [name]  Account Number:  [id number] Admit date:  09/23/2014  Clinical Social Worker:  Lupita Leash Quinette Hentges, LCSW  Date/time:  09/29/2014 02:50 PM  Clinical Social Work is seeking post-discharge placement for this patient at the following level of care:   SKILLED NURSING   (*CSW will update this form in Epic as items are completed)   09/29/2014  Patient/family provided with [facility] Department of Clinical Social Works list of facilities offering this level of care within the geographic area requested by the patient (or if unable, by the patients family).  09/29/2014  Patient/family informed of their freedom to choose among providers that offer the needed level of care, that participate in Medicare, Medicaid or managed care program needed by the patient, have an available bed and are willing to accept the patient.  09/29/2014  Patient/family informed of [facility] ownership interest in [facility] as well as of the fact that they are under no obligation to receive care at this facility.  PASARR submitted to EDS on 09/29/2014 PASARR number received on 09/29/2014  FL2 transmitted to all facilities in geographic area requested by pt/family on  09/29/2014 FL2 transmitted to all facilities within larger geographic area on   Patient informed that his/her managed care company has contracts with or will negotiate with  certain facilities, including the following:   [facility] Medicare Complete     Patient/family informed of bed offers received:  09/29/2014 Patient chooses bed at [facility] Physician recommends and patient chooses bed at    Patient to be transferred to [facility] on  09/29/2014 Patient to be transferred to facility by Ambulance, Sharin Mons) Patient and family notified of transfer on 09/29/2014 Name of family member notified:  Sallyanne Kuster  Daughter  The following physician request were entered in Epic: Physician Request  Please prepare priority discharge summary and prescriptions.  Please sign FL2.    Additional Comments: 09/29/14  DC to SNF today. Prior to today- Physical Therapy recommended 24 hour supervision at home and Home Health. This afternoon- around 1:30- they assessed patient and determined that she would be appropriate for SNF placement. This was the wish of patient, son and daughter that patient enter SNF placement as son does not feel he can manage patient's are at home any longer.  Daughter was instructed to begin a Medicaid application for long term care.  They chose a bed at [facility] and facility will accept patient this afternoon.  Daughter to go to the facility to sign admission paperwork. The facility will assist family with medicaid application.  Palliatiave care will follow patient at the facility.  Patient, son, daughter were very pleased with d/c plan.  Nursing notified to call report. CSW signing off.

## 2014-09-29 NOTE — Progress Notes (Signed)
CRITICAL VALUE ALERT  Critical value received:  CO2 40  Date of notification:  09-29-14  Time of notification: 0826  Critical value read back:yes  Nurse who received alert:  Junie Panning RN  MD notified (1st page):  Regalado MD  Time of first page:  0906  MD notified (2nd page):  Time of second page:  Responding MD:   Sunnie Nielsen MD  Time MD responded:  [phone]  No new orders.

## 2014-09-29 NOTE — Progress Notes (Signed)
Occupational Therapy Treatment Patient Details Name: Jessica MeansJulia [name] MRN: [id number] DOB: 11/22/1911 Today's Date: 09/29/2014    History of present illness HPI: Jessica MeansJulia [name] is a [age] y.o. female with PMH of hypertension, CHF, hx of STEMI,hyperlipidemia, GERD, who presents with SOB; recent admission 3 wks ago with MI   OT comments  Pt seen today for UE strengthening and endurance to address safety with ADLs. Pt continues to fatigue quickly during functional mobility and ADLs at sink. Pt now plans to d/c to SNF and feel this would be the safest venue for pt given her limitations with activity. Discharge plan updated as appropriate.    Follow Up Recommendations  SNF;Supervision/Assistance - 24 hour    Equipment Recommendations  None recommended by OT    Recommendations for Other Services      Precautions / Restrictions Precautions Precautions: Fall Restrictions Weight Bearing Restrictions: No       Mobility Bed Mobility               General bed mobility comments: Pt sitting up in recliner when OT arrived.   Transfers Overall transfer level: Needs assistance Equipment used: Rolling walker (2 wheeled) Transfers: Sit to/from Stand Sit to Stand: Min guard         General transfer comment: Pt was able to power-up to stand with increased time and no physical assist. Pt was cued to gain balance prior to intiating ambulation.     Balance Overall balance assessment: Needs assistance Sitting-balance support: No upper extremity supported;Feet supported Sitting balance-Leahy Scale: Fair     Standing balance support: Bilateral upper extremity supported;During functional activity Standing balance-Leahy Scale: Poor Standing balance comment: Pt required UE on RW or on sink during grooming tasks for support. Pt leaned on elbows at sink to brush teeth and fatigued quickly.                    ADL Overall ADL's : Needs assistance/impaired     Grooming: Oral  care;Wash/dry hands;Min guard;Standing                   Toilet Transfer: Min guard;Ambulation;RW           Functional mobility during ADLs: Min guard;Rolling walker General ADL Comments: Pt performed ther ex sitting in chair, then ambulated to sink for oral care and grooming tasks. Pt ambulated back to recliner with Min Guard but demonstrated fatigue following activity.                 Cognition  Arousal/Alertness: Awake/Alert Behavior During Therapy: WFL for tasks assessed/performed Overall Cognitive Status: Within Functional Limits for tasks assessed                         Exercises General Exercises - Upper Extremity Shoulder Flexion: AROM;Both;10 reps;Seated Shoulder Horizontal ABduction: AROM;Both;10 reps;Seated Shoulder Horizontal ADduction: AROM;Both;10 reps;Seated Chair Push Up: AROM;Strengthening;Both;10 reps (with 3 second hold)           Pertinent Vitals/ Pain       Pain Assessment: No/denies pain         Frequency Min 2X/week     Progress Toward Goals  OT Goals(current goals can now be found in the care plan section)  Progress towards OT goals: Progressing toward goals  Acute Rehab OT Goals Patient Stated Goal: to go to rehab OT Goal Formulation: With patient Time For Goal Achievement: 10/02/14 Potential to Achieve Goals: Good ADL Goals Pt Will Perform  Upper Body Bathing: sitting;with set-up Pt Will Perform Lower Body Bathing: sit to/from stand;with set-up Pt Will Perform Lower Body Dressing: with set-up;sit to/from stand Pt Will Transfer to Toilet: with modified independence;ambulating;bedside commode Pt Will Perform Toileting - Clothing Manipulation and hygiene: with modified independence;sit to/from stand  Plan Discharge plan needs to be updated       End of Session Equipment Utilized During Treatment: Gait belt;Rolling walker   Activity Tolerance Patient tolerated treatment well   Patient Left in chair;with call  bell/phone within reach;with family/visitor present   Nurse Communication          Time: 3532-9924 OT Time Calculation (min): 38 min  Charges: OT General Charges $OT Visit: 1 Procedure OT Treatments $Self Care/Home Management : 8-22 mins $Therapeutic Exercise: 8-22 mins  Nena Jordan M 09/29/2014, 4:07 PM  Carney Living, OTR/L Occupational Therapist [phone] (pager)

## 2014-09-29 NOTE — Progress Notes (Signed)
DC IV, DC to SNF. Discharge instructions handled by Child psychotherapist. Report given to receiving RN. Patient leaving unit via EMS and appears in no acute distress.

## 2014-09-29 NOTE — Discharge Summary (Addendum)
Physician Discharge Summary  Jessica [name] WFU:[id number] DOB: 12-Mar-1911 DOA: 09/23/2014  PCP: Noralee Space, MD  Admit date: 09/23/2014 Discharge date: 09/29/2014  Time spent: 35 minutes  Recommendations for Outpatient Follow-up:  Needs B-met to follow sodium level, adjust lasix as needed.  Need daily weight and further evaluation for Heart Failure.  Consider palliative care referral at facility.   Discharge Diagnoses:    Acute on chronic combine CHF exacerbation   HYPERCHOLESTEROLEMIA   Essential hypertension   GERD (gastroesophageal reflux disease)   Chronic kidney disease (CKD), stage III (moderate)   TIA (transient ischemic attack)   Dysphagia, oropharyngeal phase   Bradycardia   Hypernatremia   Hypokalemia   Discharge Condition: stable.   Diet recommendation: heart Healthy  [facility] Weights   09/27/14 2025 09/28/14 0509 09/29/14 0543  Weight: 50.8 kg (111 lb 15.9 oz) 53.252 kg (117 lb 6.4 oz) 50.1 kg (110 lb 7.2 oz)    History of present illness:  Jessica [name] is a 80 y.o. female with PMH of hypertension, CHF, hx of STEMI,hyperlipidemia, GERD, who presents with SOB  Patient was recently hospitalized and treated for congestive heart failure in September. After she was discharged home, she has been doing okay. She has been compliant to her medications. She uses walker to walk around at home. At about 2:30 PM, she started having shortness of breath. She also has very mild dry cough. She does not have other symptoms, including fever, chills, chest pain, nausea, vomiting, abdominal pain, diarrhea, rashes. She does not have pain over her calf area. EMS was called and found the patient has oxygen desaturation to 84%. She was brought to the hospital for further evaluation and treatment.  She is found to have negative troponin, but elevated proBNP at 9280. Chest x-ray showed pulmonary edema. She is admitted to inpatient folate evaluation.    Hospital Course:  Brief narrative   80 y.o. female with PMH of hypertension, CHF, hx of STEMI,hyperlipidemia, GERD, who presents with SOB  Patient was recently hospitalized and treated for congestive heart failure. On the day of admission she had acute onset of SOB. She also had very mild dry cough. She did not have other symptoms, including fever, chills, chest pain, nausea, vomiting, abdominal pain, diarrhea, rashes. EMS was called and found the patient has oxygen desaturation to 84%. She was brought to the hospital for further evaluation and treatment.  She was found to have negative troponin, but elevated proBNP at 9280. Chest x-ray showed pulmonary edema.   Assessment/Plan:  Acute on chronic systolic and diastolic CHF exacerbation:  -Clinically improved with diuresis. Urine output not clearly monitored ( possibly due to incontinence)  -continue home dose ASA, Lisinopril.  - holding her Metoprolol given bradycardia with heart rate as low as 30s.  -Continue with Lasix dose to 40 mg once a day ( had increased to 40 mg iv bid) as patient is retaining CO2 and Na elevated.  -d/c telemetry  -Weight: 59---55--50---53 --53  HTN:  - continue home lisinopril and hold metoprolol due to bradycardia.  CAD:  - Continue ASA  - When necessary nitroglycerin  hx of TIA:  on ASA and plavix  CKD-III:  Stable  hyernateremia and hypercarbia  possibly from contraction alkalosis with over diuresis. Lasix dose reduced.  Sodium level decrease now at 148. Will continue with same dose of lasix. Need repeat bmet.  Hypokalemia  replenish  Dysphagia  Dys level 1 diet . May advance for comfort feeds .Give meds crushed in puree  Patient failed swallow eval with high risk for aspiration. Dr Clementeen Graham Discussed in detail with her son. He doesnot wish for any feeding tube and says the patient never wanted to have one. He would like her to receive comfort feeding if possible. he was explained the risk of aspiration associated with it but still prefers  she be allowed to eat.  DVT prophylaxis: SQ Heparin      Procedures:  none  Consultations:  palliative  Discharge Exam: Filed Vitals:   09/29/14 1036  BP: 120/43  Pulse:   Temp:   Resp:     General: Alert in no distress.  Cardiovascular: S 1, S 2 RRR Respiratory: few crackles bases.   Discharge Instructions You were cared for by a hospitalist during your hospital stay. If you have any questions about your discharge medications or the care you received while you were in the hospital after you are discharged, you can call the unit and asked to speak with the hospitalist on call if the hospitalist that took care of you is not available. Once you are discharged, your primary care physician will handle any further medical issues. Please note that NO REFILLS for any discharge medications will be authorized once you are discharged, as it is imperative that you return to your primary care physician (or establish a relationship with a primary care physician if you do not have one) for your aftercare needs so that they can reassess your need for medications and monitor your lab values.  Discharge Instructions   Diet - low sodium heart healthy    Complete by:  As directed      Increase activity slowly    Complete by:  As directed           Current Discharge Medication List    START taking these medications   Details  furosemide (LASIX) 40 MG tablet Take 1 tablet (40 mg total) by mouth daily. Qty: 30 tablet, Refills: 0      CONTINUE these medications which have NOT CHANGED   Details  ALPRAZolam (XANAX) 0.25 MG tablet Take 0.125-0.25 mg by mouth 3 (three) times daily as needed. For nerves    aspirin EC 81 MG tablet Take 81 mg by mouth daily.    clopidogrel (PLAVIX) 75 MG tablet Take 1 tablet (75 mg total) by mouth daily with breakfast. Qty: 30 tablet, Refills: 5    donepezil (ARICEPT) 5 MG tablet Take 5 mg by mouth 2 (two) times daily.    feeding supplement (ENSURE  COMPLETE) LIQD Take 237 mLs by mouth 2 (two) times daily between meals. Qty: 60 Bottle, Refills: 0    gabapentin (NEURONTIN) 100 MG capsule Take 100 mg by mouth 3 (three) times daily.    lisinopril (PRINIVIL,ZESTRIL) 5 MG tablet Take 1 tablet (5 mg total) by mouth daily. Qty: 30 tablet, Refills: 5    Multiple Vitamin (MULTIVITAMIN) tablet Take 1 tablet by mouth daily.      oxybutynin (DITROPAN) 5 MG tablet Start with 1/2 tablet two times daily Qty: 60 tablet, Refills: 6    phosphorus (K PHOS NEUTRAL) 155-852-130 MG tablet Take 250 mg by mouth 2 (two) times daily.    nitroGLYCERIN (NITROSTAT) 0.4 MG SL tablet Place 1 tablet (0.4 mg total) under the tongue every 5 (five) minutes x 3 doses as needed for chest pain. Qty: 25 tablet, Refills: 1      STOP taking these medications     metoprolol succinate (TOPROL XL) 25 MG 24 hr  tablet        No Known Allergies Follow-up Information   Follow up with NADEL,SCOTT M, MD In 1 week.   Specialty:  Pulmonary Disease   Contact information:   [place] [address] [phone]        The results of significant diagnostics from this hospitalization (including imaging, microbiology, ancillary and laboratory) are listed below for reference.    Significant Diagnostic Studies: Ct Head Wo Contrast  09/16/2014   CLINICAL DATA:  Left-sided numbness.  EXAM: CT HEAD WITHOUT CONTRAST  TECHNIQUE: Contiguous axial images were obtained from the base of the skull through the vertex without intravenous contrast.  COMPARISON:  None.  FINDINGS: No intra-axial or extra-axial pathologic fluid or blood collections identified. Diffuse mild atrophy. No mass lesion. No hydrocephalus. No hemorrhage. No acute bony abnormality.  IMPRESSION: No acute abnormality.   Electronically Signed   By: Marcello Moores  Register   On: 09/16/2014 10:15   Mr Brain Wo Contrast  09/16/2014   CLINICAL DATA:  Initial encounter for left-sided numbness following discharge from  rehab yesterday.  EXAM: MRI HEAD WITHOUT CONTRAST  TECHNIQUE: Multiplanar, multiecho pulse sequences of the brain and surrounding structures were obtained without intravenous contrast.  COMPARISON:  CT head without contrast 09/16/2014.  FINDINGS: The diffusion-weighted images demonstrate no evidence for acute or subacute infarction. No acute hemorrhage or mass lesion is present. Mild atrophy and white matter changes are within normal limits for age. The ventricles are of normal size. No significant extraaxial fluid collection is present.  Flow is present in the major intracranial arteries. The patient is status post right lens replacement. The globes and orbits are otherwise intact. The paranasal sinuses and mastoid air cells are clear.  IMPRESSION: Negative MRI of the brain for age.   Electronically Signed   By: Lawrence Santiago M.D.   On: 09/16/2014 14:38   Dg Chest Port 1 View  09/23/2014   CLINICAL DATA:  Shortness of breath with cough and congestion; right hand tremor, history of hypertension, anxiety and dementia  EXAM: PORTABLE CHEST - 1 VIEW  COMPARISON:  Portable chest x-ray of August 29, 2014  FINDINGS: The cardiopericardial silhouette is enlarged. The pulmonary vascularity is engorged. The pulmonary interstitial markings are increased. The hemidiaphragms are obscured and bilateral pleural effusions are present. There are degenerative changes of the shoulders.  IMPRESSION: Congestive heart failure with acute pulmonary and interstitial and alveolar edema and bilateral pleural effusions. These findings are new since the previous study.   Electronically Signed   By: David  Martinique   On: 09/23/2014 17:03    Microbiology: Recent Results (from the past 240 hour(s))  URINE CULTURE     Status: None   Collection Time    09/23/14  4:42 PM      Result Value Ref Range Status   Specimen Description URINE, CATHETERIZED   Final   Special Requests NONE   Final   Culture  Setup Time     Final   Value:  09/23/2014 22:14     Performed at [facility]     Final   Value: NO GROWTH     Performed at [facility]   Culture     Final   Value: NO GROWTH     Performed at [facility]   Report Status 09/24/2014 FINAL   Final     Labs: Basic Metabolic Panel:  Recent Labs Lab 09/23/14 1700 09/24/14 0351 09/27/14  0430 09/28/14 0813 09/29/14 0703  NA 142 145 153* 149* 148*  K 4.6 3.9 3.4* 3.8 3.8  CL 102 101 101 99 99  CO2 34* 35* >45* 43* 40*  GLUCOSE 99 97 104* 92 85  BUN '15 15 12 11 12  ' CREATININE 1.05 1.17* 0.94 0.89 0.96  CALCIUM 10.2 10.0 10.1 10.6* 10.8*  MG  --  2.4  --   --   --    Liver Function Tests: No results found for this basename: AST, ALT, ALKPHOS, BILITOT, PROT, ALBUMIN,  in the last 168 hours No results found for this basename: LIPASE, AMYLASE,  in the last 168 hours No results found for this basename: AMMONIA,  in the last 168 hours CBC:  Recent Labs Lab 09/23/14 1700  WBC 6.2  NEUTROABS 5.1  HGB 12.8  HCT 40.6  MCV 89.8  PLT 311   Cardiac Enzymes:  Recent Labs Lab 09/23/14 2220 09/24/14 0351 09/24/14 0930  TROPONINI <0.30 <0.30 <0.30   BNP: BNP (last 3 results)  Recent Labs  09/23/14 1700  PROBNP 9280.0*   CBG: No results found for this basename: GLUCAP,  in the last 168 hours     Signed:  Niel Hummer A  [facility] 09/29/2014, 12:57 PM

## 2014-09-29 NOTE — Progress Notes (Signed)
Patient had family at the bedside until 2300 last night.  Patient slept throughout the night with no complaints.

## 2014-09-30 NOTE — Clinical Social Work Psychosocial (Addendum)
     Clinical Social Work Department BRIEF PSYCHOSOCIAL ASSESSMENT 09/29/2014  Patient:  Jessica [name], Jessica [name]     Account Number:  [id number]     Admit date:  09/23/2014  Clinical Social Worker:  Elam Dutch  Date/Time:  09/29/2014 14:50 PM  Referred by:  Physician  Date Referred:  09/28/2014 Referred for  SNF Placement   Other Referral:   Interview type:  Other - See comment Other interview type:   Patient and son Mliss Fritz (in room)  Telephone call with daughter Fausto Skillern    PSYCHOSOCIAL DATA Living Status:  FAMILY Admitted from facility:   Level of care:   Primary support name:  Vicie Mutters  [phone] Primary support relationship to patient:  CHILD, ADULT Degree of support available:   Strong support  Daughter:  Fausto Skillern  (c) Robinson Current Concerns  Post-Acute Placement   Other Concerns:    SOCIAL WORK ASSESSMENT / PLAN 80 year old female referred to [facility] for SNF placement. Prior to today- PT recommended home with 24 hour supervision. Patient lives at home with her husband who is 45 years old and their son Mliss Fritz who is the primary caregiver.  Patient had indicated a desire to return home. Due to PT's recommendations- she did not meet criteria for SNF placement.  This afternoon however, prior to d/c- PT changed their recommendation to SNF. CSW met with patient, son Mliss Fritz and spoke to daughter Caren Griffins on the phone. All agreed that patient needed SNF and requested assistance.  CSW initiated a bed search and family chose [facility].  Fl2 completed and signed by MD.OK per MD for d/c today to SNF. Bed offer received and accepted with [facility]; daughter to go to facility to sign admission paperwork.   Assessment/plan status:  No Further Intervention Required Other assessment/ plan:   Information/referral to community resources:   SNF bed list discussed wtih patient, son and daughter     PATIENTS/FAMILYS RESPONSE TO PLAN OF CARE: Patient is alert and oriented to person, place and time. She states she "gets forgetful" at times but is very invovled in her own plan of care. She feels that she needs to seek SNF placement and anticipates that it will be for long ter care.  Son and daughter are in agreement as they feel that their parents need full time care and son is no longer able to manage their care at home.  Patient is agreeable to SNF and was actually relieved to be able to obtain a SNF bed.  Her son and daughter responded in same manner as they did not want to take patient home.  Nursing notified to call report to [facility].  CSW signing off.

## 2014-10-04 ENCOUNTER — Telehealth: Payer: Self-pay | Admitting: Pulmonary Disease

## 2014-10-04 NOTE — Telephone Encounter (Signed)
[id number] x1 

## 2014-10-04 NOTE — Telephone Encounter (Signed)
Jessica [name] called back. Pt has 2 appts scheduled tomorrow. I offered to r/s SN appt to another date but she reports to keep the appt scheduled for now and if they need to r/s she will call back. Will sign off message

## 2014-10-06 ENCOUNTER — Encounter: Payer: Self-pay | Admitting: Cardiology

## 2014-10-06 ENCOUNTER — Encounter: Payer: Self-pay | Admitting: Pulmonary Disease

## 2014-10-06 ENCOUNTER — Ambulatory Visit (INDEPENDENT_AMBULATORY_CARE_PROVIDER_SITE_OTHER)
Admission: RE | Admit: 2014-10-06 | Discharge: 2014-10-06 | Disposition: A | Discharge status: Home / Self Care | Payer: Medicare Other | Source: Ambulatory Visit | Attending: Pulmonary Disease | Admitting: Pulmonary Disease

## 2014-10-06 ENCOUNTER — Ambulatory Visit (INDEPENDENT_AMBULATORY_CARE_PROVIDER_SITE_OTHER): Payer: Medicare Other | Admitting: Pulmonary Disease

## 2014-10-06 ENCOUNTER — Ambulatory Visit (INDEPENDENT_AMBULATORY_CARE_PROVIDER_SITE_OTHER): Payer: Medicare Other | Admitting: Cardiology

## 2014-10-06 ENCOUNTER — Other Ambulatory Visit (INDEPENDENT_AMBULATORY_CARE_PROVIDER_SITE_OTHER): Payer: Medicare Other

## 2014-10-06 VITALS — BP 129/62 | HR 55 | Ht 64.0 in | Wt 118.5 lb

## 2014-10-06 VITALS — BP 124/62 | HR 55 | Temp 98.4°F | Ht 60.0 in | Wt 120.0 lb

## 2014-10-06 DIAGNOSIS — I2109 ST elevation (STEMI) myocardial infarction involving other coronary artery of anterior wall: Secondary | ICD-10-CM

## 2014-10-06 DIAGNOSIS — R269 Unspecified abnormalities of gait and mobility: Secondary | ICD-10-CM

## 2014-10-06 DIAGNOSIS — M159 Polyosteoarthritis, unspecified: Secondary | ICD-10-CM

## 2014-10-06 DIAGNOSIS — F411 Generalized anxiety disorder: Secondary | ICD-10-CM

## 2014-10-06 DIAGNOSIS — N183 Chronic kidney disease, stage 3 unspecified: Secondary | ICD-10-CM

## 2014-10-06 DIAGNOSIS — G458 Other transient cerebral ischemic attacks and related syndromes: Secondary | ICD-10-CM

## 2014-10-06 DIAGNOSIS — I5042 Chronic combined systolic (congestive) and diastolic (congestive) heart failure: Secondary | ICD-10-CM

## 2014-10-06 DIAGNOSIS — G629 Polyneuropathy, unspecified: Secondary | ICD-10-CM

## 2014-10-06 DIAGNOSIS — R0600 Dyspnea, unspecified: Secondary | ICD-10-CM

## 2014-10-06 DIAGNOSIS — R531 Weakness: Secondary | ICD-10-CM

## 2014-10-06 DIAGNOSIS — I4510 Unspecified right bundle-branch block: Secondary | ICD-10-CM

## 2014-10-06 DIAGNOSIS — R609 Edema, unspecified: Secondary | ICD-10-CM

## 2014-10-06 DIAGNOSIS — I10 Essential (primary) hypertension: Secondary | ICD-10-CM

## 2014-10-06 DIAGNOSIS — I872 Venous insufficiency (chronic) (peripheral): Secondary | ICD-10-CM

## 2014-10-06 DIAGNOSIS — M150 Primary generalized (osteo)arthritis: Secondary | ICD-10-CM

## 2014-10-06 DIAGNOSIS — M810 Age-related osteoporosis without current pathological fracture: Secondary | ICD-10-CM

## 2014-10-06 DIAGNOSIS — I2129 ST elevation (STEMI) myocardial infarction involving other sites: Secondary | ICD-10-CM

## 2014-10-06 LAB — BASIC METABOLIC PANEL
BUN: 16 mg/dL (ref 6–23)
CO2: 42 meq/L — AB (ref 19–32)
Calcium: 10.4 mg/dL (ref 8.4–10.5)
Chloride: 98 mEq/L (ref 96–112)
Creatinine, Ser: 1.1 mg/dL (ref 0.4–1.2)
GFR: 56.26 mL/min — ABNORMAL LOW (ref >60.00)
GLUCOSE: 89 mg/dL (ref 70–99)
POTASSIUM: 3.1 meq/L — AB (ref 3.5–5.1)
SODIUM: 142 meq/L (ref 135–145)

## 2014-10-06 NOTE — Patient Instructions (Signed)
Today we updated your med list in our EPIC system...    Continue your current medications the same...  Today we rechecked your CXR & blood work...    We will contact you w/ the results when available...   We wrote for [facility] & [facility] to take for your constipation...  Let's plan a follow up visit in 3 weeks.Marland KitchenMarland Kitchen

## 2014-10-06 NOTE — Patient Instructions (Signed)
Your physician recommends that you schedule a follow-up appointment in: 3 Months with Dr Berry  

## 2014-10-06 NOTE — Assessment & Plan Note (Signed)
Controlled.  

## 2014-10-06 NOTE — Progress Notes (Signed)
Subjective:    Patient ID: Jessica [name], female    DOB: 03/29/1911, 80 y.o.   MRN: [id number]  HPI 80 y/o BF here for a follow up visit...  She has mult medical problems including:  AR & runny nose;  Asthmatic Bronchitis;  HBP;  Chronic VI & edema;  Hyperchol;  GERD & esoph stricture;  Divertics;  Hx Urinary incontinence & UTIs;  DJD/ Osteoporosis;  Semile Dementia;  Anxiety...  SEE PREV EPIC NOTES FOR EARLIER DATA >>  ~  December 03, 2012:  64mo ROV & Nika's CC= swelling in feet; her wt= 114# up 1# on Demadex20, Diamox250, K10-3/d, and KPhos-Bid; reminded to elim sodium, elev legs, wear support hose...  She is under a lot of stress w/ [facility], son helps but works...      We reviewed prob list, meds, xrays and labs> see below for updates >>  LABS 12/13:  Chems- ok w/ K=3.5 Creat=1.1 Ca=10.8 Phos=3.6 PTH=122 (c/w hyperparathyroidism)...  We decided to continue her same meds for now...  ~  January 08, 2013:  91mo ROV & Muna continues to insist on monthly ROVs- c/o pedal edema about the same as prev, wt stable 114# despite running out of diuretics & we discussed refilling her Demadex20 & Diamox250;  BP stable, O2 sats are 99% on RA, chest clear, etc...  She persists w/ bladder complaints and she is reminded about the double voiding technique per DrTannenbaum & rec for Urology f/u prn...  ~  March 23, 2013:  2-75mo ROV & Zafira is c/o ankle swelling which tends to go down overnight- we discussed stopping her Amlodipine & redoubling efforts at no salt, elevation, support hose, & Demadex;  also c/o fine rash ?generalized w/ pruritis & we discussed Rx w/ Lotrisone cream & Atarax $Remove'25mg'YWobQZQ$  prn... She is once again asking for yet another round of home PT therapy...  We reviewed the following medical problems during today's office visit >>     HBP> on ASA81, Amlod2.5, Lisin5, Demadex20-1/2, Diamox250, & K10-3/d + KPhos-1Bid; BP=100/62 & she is feeling sl weak w/ mult somatic complaints; labs 4/14 show K=3.7,  Phos=3.4, Cr=1.1; we decided to STOP the Amlodipine & work on sodium restriction.    VI, edema> she knows to elim sodium, elev legs, wear support hose, take Demadex $RemoveBefo'20mg'kFdMODBHOKy$ /d...    Chol> on diet alone; she has a very hi HDL ~150!!!    Hypercalcemia> assoc w/ low phos- c/w primary hyperparathyroidism- she is 80y/o & not felt to be an operative candidate; she was started on KPhos 10/13 in [place] on 2Bid w/ improvement in serum Phos to 3.6; serum Ca = 11.2 now but PTH is not rising.    GI- GERD, Divertics> off prev Prilosec20, Miralax, Senakot-S; advised these all as needed...    GU> saw DrTannenbaum for Urology 10/12- mult complaints, urodynamics done w/ sm hypersens bladder unstable w/ leakage, & large bladder divertic> he rec double voiding technique...    DJD, Osteopenia> off Ca supplements they sent her home from hosp w/ Phos-2Bid...    Senile Dementia> on Aricept10 & Alpraz0.25 as needed... We reviewed prob list, meds, xrays and labs> see below for updates >>   LABS 4/14:  Chems- Ca=11.2 but PTH is sl lower than prev at 86;  Phos=3.4 on KPhos1tabBid; K= 3.7 on this & K10- 2AM+1PM;  LFTs= wnl and Alb= 4.0 on Ensure...  ~  May 20, 2013:  42mo ROV & add-on for swelling> Dayrin persists w/ c/o swelling in  her legs; ?if she recalls our prev conversation about salt, elevation, support hose; she is taking the Demadex165mQam & Diamox25105mQpm along w/ her KCl & KPhos=> labs today show: K=3.6, BUN=17, Cr=1.0;  Exam shows 3+edema in feet & lower legs, chest clear, heart w/ Gr1/6 SEM w/o rubs or gallops apprec;  We reviewed the need to Elim salt, Elevate the legs, Wear support hose, & we decided to STOP Lisinopril (BP=110/68) and INCREASE the Demadex20 to 2tabsQam...  ROV in 4-6weeks recheck; hopefully she can keep her visiting nurses, home help, etc (she will not go willingly to a NH or AL facility)...     We reviewed prob list, meds, xrays and labs> see below for updates >>   ~  June 23, 2013:  65m259moV &  JulAsmara here w/ her daugh & her husb CecLaveda Abbeast seen 12/11 & asked to f/u in 59mo38moLast OV we increased her Demadex to 20-2Qam + her Diamox250/d along w/ her KCl & KPhos- she is improved having lost 3# down to 110# & less edema; BP is improved as well at 120/62;  Her CC today is some burning discomfort in the bottom of her feet esp when standing &walking; we discussed Neuropathy symptoms & she requests med rx trial- try Gabapentin 100->300mg27m.  We reviewed prob list, meds, xrays and labs> see below for updates >>   Labs 7/14:  Chems- wnl x Ca=10.8, continue same meds...  ~  August 24, 2013:  58mo R558mo Selen says she was Dx w/ Scabies yest by Derm iPayton MccallumS, we do not have notes; she says she got it from her husb who picked it up while in rehab; her CC is itching & hopefully the Kwell lotion will take care of all this; she states that her neuropathy sx are better on the Gabapentin100Tid; She will turn 89 y/69ext month...  BP remains well controlled on diuretics alone & measures 116/64 today... We reviewed prob list, meds, xrays and labs> see below for updates >>  OK 2014 FLU vaccine today...   ~  October 25, 2013:  58mo RO40moJulia hHolidayrned 80y/o & she framed her letter from [place]; states she's doing fair- same old problems, pain in left shoulder & she wants more PT, but I offered Ortho eval for XRay & poss injection instead...     BP is controlled on ASA81, Demadex20-2Qam, Diamox250, & K10-3/d + KPhos-1Bid (off prev Amlod & Lisin); BP=122/70 & she denies angina pain, palpit, ch in SOB etc; K is sl low at 3.2 7 rec to incr KCl10 to 2Bid...    Serum Calcium id ok at 10.3- no progressive incr & we continue to monitor this parameter...    DJD is treated w/ OTC analgesics and rec to f/u w/ Ortho re: shoulder pain...    She has a senile dementia- on Aricept10 and Alpraz0.25 for prn use... We reviewed prob list, meds, xrays and labs> see below for updates >>   LABS 11/14:  Chems- ok x K=3.2,  BUN=24, Cr=1.1;  CBC w/ Hg=11.8;  TSH=2.48...  ~  March 15, 2014:  38mo ROV365moulia has the same chr complaints- generally stable, notes leaking bladder (prev eval DrTannenbaum), burning feet (saw Podiatry-DrRegal), occas indigestion... She notes that she [facility] to check her ears for lavage...     She notes that her breathing is fine- no recent resp  exac, cough, sputum, ch in SOB, etc...    BP= 106/64 on Demadex20Qam & Diamox250Qpm w/ K10-2Bid and KPhos1Bid; see labs below...    She ambulates w/ cane & walker, denies CP/ palpit/ ch in edema...     She notes some urinary incont & wears depends, no burning etc...    She remains on Aricept5Bid- she prefers it this way & won't change... We reviewed prob list, meds, xrays and labs> see below for updates >> she did not bring med list or med bottles & reminded to bring bottles to every OV...   LABS 1/15:  Chems- ok w/ K=3.5, Cr=1.2  ~  May 20, 2014:  65moROV & Arriana is stable, same chr complaints which she like to discuss Q24mo.     HBP> on ASA81, Demadex20-2Qam, Diamox250-at 4pm, & K10-2Bid + KPhos-1Bid; BP=100/60 & labs 6/15 show K=4.0, Phos=2.5, Cr=1.1; continue same meds for now...    VI, edema> she knows to elim sodium, elev legs, wear support hose, take Demadex & Diamox as directed...    Chol> on diet alone; she has a very hi HDL ~150 when measured in 2012...    Hypercalcemia> assoc w/ low phos- c/w primary hyperparathyroidism- she is 80y/o & not felt to be an operative candidate; we are following Ca, PO4, PTH levels...     GI- GERD, Divertics> off prev Prilosec20, Miralax, Senakot-S; advised these all as needed...    GU> saw DrTannenbaum for Urology 10/12- mult complaints, urodynamics done w/ sm hypersens bladder unstable w/ leakage, & large bladder divertic> he rec double voiding technique...    DJD, Osteopenia> off Ca supplements, on KPhos-2Bid, on MVI & encouraged to  walk as much as poss...    Senile Dementia> on Aricept10 & Alpraz0.25 as needed... We reviewed prob list, meds, xrays and labs> see below for updates >>   LABS 6/15:  Chems- ok x Ca=11.2, PO4=2.5, PTH=108 c/w primary hyperpara;  CBC- Hg=12.3;  TSH=2.09...   ~  July 20, 2014:  4m23moV & JulDaviellemains stable overall- states that she has been doing fairly well but has a list of questions and wants me to re-order visiting nurses to check on her & husb every week or so (I explained [facility] & see what she is eligible for under Medicare)...     1) she wants a nasal spray for drippy nose> try ASTELIN 1-2 sp in each nostril Bid as needed...    2) she is c/o burning discomfort in legs & we reviewed her Neuropathy Dx & the reason for the Gabapentin Rx- she is only taking 100m50md & gets some benefit from this she says; offered to incr dose but she can't remember to take it Tid 7 dosen't want to incr to 2Bid so we decided to keep it the same for now...    3) she is c/o urinary symptoms- leaking, some incont, denies burning/ odor/ pain/ blood etc; we discussed checking UA & C&S; we reviewed prev evals from DrNesi & DrTannenbaum- offered ROV w/ these providers but she declined; finally agreed to try Ditropan5mg-36m2tab bid for her symptoms and let me know if she is better...    4) she wants f/u labs and is due for BMet & Phos level (f/u hyperpara on observation)...  We reviewed prob list, meds, xrays and labs> see below for updates >> she brought med bottles and we reviewed her Rx today...  LABS 8/15:  Chems- ok  x K=3.1 and Cr=1.2;  Phos=2.9;  UA= clear...   ~  September 20, 2014:  66moROV & post hosp visit> JYuriwas Adm by [facility] 9/14 - 09/02/14 w/ SOB, she denied CP, EKG was unchanged but Troponins were elev, D-dimer was also abn & a CTAngioChest was done- neg for PE, and VenDopplers were neg for DVT; she was treated w/ Heparin transiently;  2DEcho showed decr LVF w/  EF=45-50% w/ areas of HK, & Gr1DD w/ mild AS/AI- felt to have prob anteroseptal MI w/ distal LAD lesion likely but she refused invasive eval & was made a NCB/ DNR/ med rx only;  Meds were adjusted w/ Hep=> Plavix added, BBlocker continued, and ACE added; [facility]...  In the rehab facility she was managed by the SRoanntheir notes are reviewed...  She was disch home 10/2 w/ home healt PT/ OT/ CNA/ Nursing/ and Social worker all arranged...  After disch she returned to the ER 10/3 w/ paresthesias and weakness> seen by ER staff & Neuro consultant; BP was recorded at 180-190 sys, CT Brain & MRI were neg for acute infarct & she was felt to have had a TIA- already on Plavix & w/ nothing further to offer she was sent home...  She comes in today w/ daugh & appears weaker, BP is low at 92/64, marked increase in leg edema since off her diuretics (stopped during recent hosp w/ her NSTEMI), going downhill but ever stoic she says she is doing satis at home w/ BSC, walker, etc; they truly love the home health care assist w/ Pt/ OT/ HHN/ aides/ & social worker; they again decline to consider NHP for pt & her husband...  We reviewed the following medical problems during today's office visit >>     HBP> prev on Demadex20-2Qam, Diamox250-at 4pm, & K10-2Bid + KPhos-1Bid; but now off all diuretics on MetopER25-1/2 daily, Lisinopril5 & KPhos-1Bid; BP=92/64 & no wiggle room to add back diuretics=> she will f/u w/ Cards soon...     ASHD, s/p NSTEMI, ischemic cardiomyopathy, combined sys & diast CHF> Adm 9/15 by [facility] w/ NSTEMI & combined sys/diast CHF; she declined intervention; medical therapy adjusted- off diuretics, added MetopER12.5/ Lisin5/ ASA81/ Plavix75, but edema incr.    VI, edema> off diuretics now per Cards adjustment 9/15; she knows to elim sodium, elev legs, wear support hose; she has f/u appt w/ Cards soon...    Chol> on diet alone; she has a very  hi HDL ~150 when measured in 2012...    Hypercalcemia> assoc w/ low phos- c/w primary hyperparathyroidism- she is 80y/o & not felt to be an operative candidate; we are following Ca, PO4, PTH levels...     GI- GERD, Divertics> off prev Prilosec20, Miralax, Senakot-S; advised these all as needed...    GU> saw DrTannenbaum for Urology 10/12- mult complaints, urodynamics done w/ sm hypersens bladder unstable w/ leakage, & large bladder divertic> he rec double voiding technique & Ditropan5-1/2Bid...    ?TIA, Neuropathy> on Neurontin100Tid; she went to the ER 10/15 one day after disch from the NH w/ numbness=> NEG CTBrain & MRI w/o acute changes, symptoms passed & sent home on same ASA81 + Plavix75/d...    DJD, Osteopenia> off Ca supplements, on KPhos-1Bid, on MVI & encouraged to walk as much as poss...    Senile Dementia> on Aricept5 & Alpraz0.25 as needed... We reviewed prob list, meds, xrays and labs> see  below for updates >>   LABS 10/15:  FLP- ok on diet alone w/ HDL=159!  Chems- ok w/ Cr=1.01, K=4.1, BS=104, A1c=6.0, Ca=10.8;  CBC- Hg=11.7;  TSH=2.48...  CXR 9/15 showed mild cardiomeg, clear lungs, poor inspiration, osteopenia, scoliosis w/o change, NAD.Marland KitchenMarland Kitchen  EKG 9/15 showed SBrady, rate47, PACs, RBBB, left axis deviation, NSSTTWA...   2DEcho 9/15 showed mild LVH, reduced LVF w/ EF=45-50% w/ HK in apex & septum, Gr1DD, mild AS/AI, mildly thickened MV leaflets & mild LAdil, PAsys=40...  CT Angio Chest 9/15 showed neg for PE, mildly enlarged cardiac chambers, Ao looked ok, COPD/E bilat w/ atx vs scarring at bases, scoliosis & DDD, ?nonobstructing stone in mid right kidney...  CT Head 10/15 showed mild diffuse atrophy, no acute changes...  MRI Head 10/15 revealed no infarct, hemorrhage, or mass; mild atrophy & sm vessel dis, NAD...  PLAN>> she has gained 19# since last here w/ marked edema (4+bilat in legs) since she's been off her diuretics; however BP is low at 92/64 on her low dose Metop & Lisin  per Cards; advised no salt/ elev legs/ support hose/ & f/u w/ Cards ASAP as planned (not many options here- perhaps stop BBlocker, add diuretic, consider pacer);  She is happy w/ all the home health support...  ~  October 06, 2014:  2wk ROV & post-hosp visit> after last OV she was hosp by [facility] from 10/9 - 09/29/14 w/ incr SOB (O2sat=84% on RA), edema (BNP=9280, CXR w/ pulm edema), and weakness; but no CP or other symptoms and troponins were neg;  She was diuresed and her meds were adjusted (Metoprolol stopped due to low heart rate); disch to NHP for rehab and she is loving the attention "they are working with me"- on Lasix40 & KPhos bid, plus her ASA/ Plavix/ Lisin5...  Her CC is constipation> going every 3-4 days & firm, no blood reported; she is rec to start a regimen w/ Miralax daily & Senakot-S 2Qhs... We discussed rechecking her CXR (improved aeration) and LABS (K=3.1, HCO3=42, Cr=1.1, BNP=485) today... REC to continue Lasix40, Add back the Diamox250/d & the KCl- 28mEq caps 2/d as before... ROV in 2-3 weeks...     We reviewed prob list, meds, xrays and labs> see below for updates >>   CXR 10/15 Hosp showed cardiomeg, engorged pulm vasculature, increased markings, can't r/o effusions, all c/w pulm edema...   CXR 10/15 office f/u showed cardiomeg, improved aeration, persist bilat effusions...  LABS 10/15 office f/u>  Chems- K=3.1, HCO3=42, BUN=16, Cr=1.1, Ca=10.4, BNP=485...           Problem List:  Hx of ALLERGIES (ICD-995.3) - she uses OTC meds as needed, & we prev discussed Zyrtek $RemoveBeforeDE'10mg'ONrBINRPXboEDYY$ Qam, Saline nasal mist, & Atrovent Nasal 0.03% Tid for drainage (one of her chronic complaints)... ~  8/15: we tries Astelin nasal spray for her chr complaints of dripping...  Hx of ASTHMATIC BRONCHITIS, ACUTE (ICD-466.0) - no recent exacerbations... ~  CXR 5/13 showed borderline heart size, kyphoscoliosis, no edema, and clear lungs... ~  CXR 10/13 showed normal heart size, clear lungs, kyphoscoliosis,  osteopenia... ~  CXR 10/15> Hosp w/ CHF (cardiomeg, bilat effusions and interstitial edema); she was diuresed and disch to NHP/rehab=> f/u CXR 10/15 w/ improved aeration, persist bilat effusions...  HYPERTENSION (ICD-401.9) >>  ~  controlled on ASA $Remo'81mg'mkFXq$ /d, NORVA'5mg'$ -1/2tab, LISINOPRIL5 & DEMADEX $RemoveB'20mg'nuCkLDoE$ /d & DIAMOX$RemoveBe'250mg'opqCuYuIX$ /d... ~  4/12:  BP= 110/60 and she states that she is taking her meds regularly & tolerating them well... denies HA, visual changes, CP,  palpit, change in dyspnea, etc... but notes weakness, intermittently dizzy & persist edema in her feet...  ~  6/12:  BP= 124/70 & she remains stable... ~  8/12:  BP= 110/ 66 & stable... ~  10/12:  BP= 114/62 & stable... ~  1/13:  BP= 110/64 & she remains stable... ~  4/13:  BP= 116/58 & she is reminded to adjust Lasix 1-2 Qam according to her edema... ~  5/13:  BP= 140/72 & she is c/o pedal edema; we decided to change to Demadex $RemoveBe'20mg'prxbEllMX$ - 2Qam. ~  7/13:  BP= 110/60 & her edema is diminished, still 1+ in feet... Labs showed HCO3=39 & DIAMOX $Remove'250mg'HSKgTmc$ /d added. ~  8/13:  BP= 118/74 & edema is the same; ?what she's taking? & we decided to simplify regimen> Norvasc1/2, Lisin1/2, Demadex-1/d & Diamox-1/d... ~  9/13:  BP= 110/70 & she is clinically stable on these meds; add K10Bid for the K=3.4 today... ~  EKG 10/13 showed NSR, rate63, RBBB, poss old infarct anterolaterally... ~  11/13:  on ASA81, Amlod2.5, Lisin5, Demadex20-1/2, Diamox250, & K20/d; BP=122/64 & she is feeling better, still w/ mult somatic complaints... ~  12/13:  BP= 124/72 & her edema, weight, etc are about stable... ~  1/14:  BP= 118/60 & she persists w/ mult somatic complaints... ~  4/14:  on ASA81, Amlod2.5, Lisin5, Demadex20-1/2, Diamox250, & K10-3/d + KPhos-1Bid; BP=100/62 & she is feeling sl weak w/ mult somatic complaints; labs 4/14 show K=3.7, Phos=3.4, Cr=1.1; we decided to STOP the Amlodipine & work on sodium restriction ~  6/14: on ASA81, Lisiin5, Demadex20, Diamox250, & K10-3/d +  KPhos-1Bid; BP= 110/68 & 3+edema persists; Labs- OK & we decided to STOP the Lisinopril & INCR the Demadex20-2Qam.... ~  7/14: on  ASA81, Demadex20-2/d, Diamox250, & K10-3/d + KPhos-1Bid; BP= 120/62 & she is improved w/ decr edema... ~  9/14:  BP remains well controlled on diuretics alone & measures 116/64 today.. ~  11/14: BP is controlled on ASA81, Demadex20-2Qam, Diamox250, & K10-3/d + KPhos-1Bid (off prev Amlod & Lisin); BP=122/70 & she denies angina pain, palpit, ch in SOB etc; K is sl low at 3.2 7 rec to incr KCl10 to 2Bid. ~  3/15: BP= 106/64 on Demadex20Qam & Diamox250Qpm w/ K10-2Bid and KPhos1Bid... ~  6/15:  on ASA81, Demadex20-2Qam, Diamox250-at 4pm, & K10-2Bid + KPhos-1Bid; BP=100/60 & labs 6/15 show K=4.0, Phos=2.5, Cr=1.1; continue same meds for now. ~  8/15: on ASA81, Demadex20-2Qam, Diamox250 at 4pm & K10-2/d;  BP= 120/62, continue same... ~  9/15: she was Hosp by [facility] w/ MI, cardiomyopathy w/ combined sys & diast CHF, & she refused invasive procedures/ cath etc; disch to [facility] rehab, then back home on ASA81, Plavix75, MetopER12.5, Lisin5, KPhos-Bid, & off all diuretics...  ~  10/15: ret to office weaker w/ 19# wt gain & 4+edema off her diuretics w/ BP= 92/64, wt up 19# w/ 4+edema=> she was re-hosp w/ pulm edema, BNP=9280, given IV Lasix & med adjustment- disch on ASA81, Plavix75, Lasix40, Lisin5, KPos-Bid=> now w/ decr edema & BP=124/62 but f/u labs showed persist bilat pleural effusions, K=3.1, HCO3=42, Cr=1.1, BNP=485... REC to continue current meds, add Diamox250 at 4PM daily, add KCl-76mEq caps 2/d w/ ROV in 2-3weeks to recheck...  VENOUS INSUFFICIENCY, CHRONIC (ICD-459.81) - there is mild pedal edema bilat L>R... on low sodium diet, elevation, support hose- prev refused diuretic meds due to urinary symptoms, now on [facility] as well...  HYPERCHOLESTEROLEMIA (ICD-272.0) - prev on Lip10, but she stopped this on her own.Marland KitchenMarland Kitchen  she has a very high HDL!!! ~  FLP was 1/08 showed TChol  253, TG 125, HDL 121, LDL 93 ~  FLP 2/09 showed TChol 200, TG 99, HDL 136, LDL 44 ~  FLP 4/11 showed TChol 244, TG 143, HDL 149, LDL 59 ~  FLP 1/12 showed TChol 266, TG 102, HDL 151, LDL 83 >> BEST HDL I'VE EVER SEEN ~  It has been hard to get her back in for FASTING labs... ~  [place] 9/15 in [place] on diet alone showed TChol 222, TG 61, HDL 159, LDL 51  HYPERCALCEMIA >> routine labs w/ Calcium levels betw 10.2 & 11.7 over the last 56yrs... ~  Labs 7/13 showed Ca= 11.8 &  PTH= 81... For now avoid calc supplements etc... ~  Labs 9/13 showed Ca= 11.0 & she is reminded- no calcium, no VitD... ~  Labs 10/13 Hosp reviewed> Ca was up, Phos was low, K was low> all improved w/ Rx in hosp (they did not repeat PTH level). ~  4/14:   assoc w/ low phos- c/w primary hyperparathyroidism- she is 80y/o & not felt to be an operative candidate; she was started on KPhos 10/13 in [place] on 2Bid w/ improvement in serum Phos to 3.6; serum Ca = 11.2 now but PTH is not rising.  ~  Labs 11/14 showed Ca=10.3 ~  Labs 1/15 showed Ca= 10.5 ~  6/15:  Labs showed  Ca=11.2, PO4=2.5, PTH=108 c/w primary hyperparathyroidism; we are following... ~  Labs 8/15 improved w/ Ca= 10.4, Phos= 2.9  GERD (ICD-530.81) - last EGD was 9/05 by DrPerry showing GERD and esoph stricture- dilated... she stopped her Nexium but states that her swallowing is good & she uses PRILOSEC OTC as needed.  DIVERTICULOSIS OF COLON (ICD-562.10) - last colonoscopy was 11/00 showing divertics only... ~  8/12:  She notes some constipation & rec to take [place]son per our usual protocol...  HX, URINARY INFECTION (ICD-V13.02) & INCONTINENCE symptoms... she has seen DrTannenbaum & staff on bladder exercises and she reported some improvement but has persistant symptoms & never followed up... ~  7/10:  offered f/u appt w/ Urology vs second opinion consult but she declines... ~  9/10:  wrote for trial Enablex 7.$RemoveBeforeD'5mg'TMaCTtdgKBrvro$ /d >> no benefit she says. ~  12/10: saw  DrNesi- tried sample med, sl improved, but "he turned me over to a lady for longer term treatments" she says... ~  4/12 & 6/12: states DrNesi hasn't been able to help her & she wants appt w/ DrTannenbaum==> seen w/ urodynamic eval revealing a sm hypersens bladder & a large bladder divertic; not a surg candidate, taught double voiding technique... ~  10/12: she had f/u DrTannenbaum & he reinforced prev w/u & need for double voiding technique... ~  10/13: In hosp Urine grew mult species, Rx w/ Roceph=> Cipro...  DEGENERATIVE JOINT DISEASE (ICD-715.90) - notes right shoulder symptoms, but managing OK w/ Mobic 7.$RemoveBe'5mg'zyIhOSGkb$  & Tylenol; also right hip & low back pain...  she also c/o neuropathic "burning" in legs but not bad enough for additional meds she says... ~  8/14: we have a note from [facility] (7 page note reviewed)> pt c/o bilat foot & ankle pain & burning w/ numbness & tingling; Dx w/ somatic dysfunction in lumbar region; subluxations seen on XRays; given an adjustment... ~  9/14: symptoms improved on Gabapentin $RemoveBefor'100mg'MRHximaQJauV$  Tid she says...  OSTEOPOROSIS (ICD-733.00) - she was on Fosamax but had discontinued this on her own... we discussed the  need for continued therapy and she was asked to restart Alendronate but she never did...  she takes Ca++, MVI, VitD==> rec to STOP the Calcium.  SENILE DEMENTIA (ICD-290.0) - she takes ASA $RemoveB'81mg'LxgicmHn$ /d... she tried Aricept in the past but didn't notice any improvement therefore stopped it. ~  she & her husb still live on their own but are having numerous difficulties while still resisting any thoughts of assisted living etc> got concerned because she saw an impulse in her left antecubital fossa (tortuous brachial art), went to the ER & this got translated to palpitations & lead to full ER cardiac eval= neg... ~  6/11: MMSE showed 26/30 & she agreed to try DONEPEZIL $RemoveBefor'10mg'hjXXNRDEfTRR$ /d... ~  6/12:  Still taking Aricept & appears stable... ~  7/13:  No change, same meds, clinically  stable... ~  7/14:  She remains stable and will be 80 y/o in Oct... ~  3/15:  She remains on Aricept5Bid- she prefers it this way & won't change...  ANXIETY (ICD-300.00) - she uses Alpraz 0.$RemoveBefor'25mg'PAiViAsTdwCT$  as needed.  Hx of SHINGLES (ICD-053.9)   Past Surgical History  Procedure Laterality Date  . Vesicovaginal fistula closure w/ tah    . Tonsillectomy    . Abdominal hysterectomy      Outpatient Encounter Prescriptions as of 10/06/2014  Medication Sig  . ALPRAZolam (XANAX) 0.25 MG tablet Take 0.125-0.25 mg by mouth 3 (three) times daily as needed. For nerves  . aspirin EC 81 MG tablet Take 81 mg by mouth daily.  . clopidogrel (PLAVIX) 75 MG tablet Take 1 tablet (75 mg total) by mouth daily with breakfast.  . donepezil (ARICEPT) 5 MG tablet Take 5 mg by mouth 2 (two) times daily.  . feeding supplement (ENSURE COMPLETE) LIQD Take 237 mLs by mouth 2 (two) times daily between meals.  . furosemide (LASIX) 40 MG tablet Take 1 tablet (40 mg total) by mouth daily.  Marland Kitchen gabapentin (NEURONTIN) 100 MG capsule Take 100 mg by mouth 3 (three) times daily.  Marland Kitchen lisinopril (PRINIVIL,ZESTRIL) 5 MG tablet Take 1 tablet (5 mg total) by mouth daily.  . Multiple Vitamin (MULTIVITAMIN) tablet Take 1 tablet by mouth daily.    . nitroGLYCERIN (NITROSTAT) 0.4 MG SL tablet Place 1 tablet (0.4 mg total) under the tongue every 5 (five) minutes x 3 doses as needed for chest pain.  Marland Kitchen oxybutynin (DITROPAN) 5 MG tablet Start with 1/2 tablet two times daily  . phosphorus (K PHOS NEUTRAL) 155-852-130 MG tablet Take 250 mg by mouth 2 (two) times daily.    No Known Allergies   Current Medications, Allergies, Past Medical History, Past Surgical History, Family History, and Social History were reviewed in [employer] record.    Review of Systems        See HPI - all other systems neg except as noted...      The patient complains of decreased hearing, dyspnea on exertion, peripheral edema, incontinence,  muscle weakness, and difficulty walking.  The patient denies anorexia, fever, weight loss, weight gain, vision loss, hoarseness, chest pain, syncope, prolonged cough, headaches, hemoptysis, abdominal pain, melena, hematochezia, severe indigestion/heartburn, hematuria, suspicious skin lesions, transient blindness, depression, unusual weight change, abnormal bleeding, enlarged lymph nodes, and angioedema.     Objective:   Physical Exam      WD, WN, 80 y/o BF in NAD... GENERAL:  Alert, pleasant & cooperative; she is slow moving as noted... HEENT:  [place]/AT, EOM-full, EACs- some wax, TMs-wnl, NOSE-clear, THROAT-clear & wnl. NECK:  Supple w/ fairROM; no JVD; normal carotid impulses w/o bruits; no thyromegaly or nodules palpated; no lymphadenopathy. CHEST:  Clear to P & A; without wheezes/ rales/ or rhonchi., noted kyphosis... HEART:  Regular Rhythm; Gr1/6 SEM, without rubs or gallops heard... ABDOMEN:  Soft & nontender; normal bowel sounds; no organomegaly or masses detected. EXT:  mod arthritic changes; no varicose veins/ +venous insuffic/ 3+edema now deficits... DERM:  No lesions noted; no rash etc...  MMSE> 06/13/10> Score 26/30 (missed place, 2/3 recall after distraction, & copy figure)...   Assessment & Plan:  ASHD, s/p NSTEMI, combined sys/diast CHF, 4+edema>  [facility] she declined invasive options, treated medically, but going downhill w/ hypotension and marked incr edema, she will f/u w/ Cards regarding options avail...  AR & ASTHMA>  Breathing is stable, at baseline, she has chr rhinorrhea complaints w/ numerous discussions regarding management of this problem....  Ven Insuffic/ Edema>>  We have had mult conversations at each OV regarding no salt, elevation, support hose; she is now off the Demadex, Diamox w/ marked incr in edema=> has f/u w/ Cards soon to see if meds can be adjusted further & diuretic restarted.  CHOL>  She has the world's best HDL!!!  Electrolye  Abn>  See 10/13 Hosp by [facility], it is apparent that her elev calcium is the result of HYPERPARATHYROIDISM but at age 54 we are reluctant to refer for surg unless progressive... We are following her serum Calcium level & non-progressive. Hypercalcemia>  Surely from Hyperpara & she has stopped the calcium supplements, & we are following the labs...  GU>  She saw DrTannenbaum w/ extensive investigation into her voiding symptoms & urodynamics as above; rec to use "double voiding" technique... 8/15> still complaining & declines ret to Urology; try Ditropan $RemoveBefo'5mg'ERgMVylOTAc$ - 1/2 tab Bid...  DJD/ Osteopenia>  Aware, she is stable on OTC meds, she has repeatedly refuse Bisphos meds for her bones...  TIA, Dementia>  On ASA81, Plavix75; She & husb Laveda Abbe are still living independently w/ daugh help but they are very fragile yet won't consider NHP...  Other medical problems as noted...   Patient's Medications  New Prescriptions      Add DIAMOX $RemoveBe'250mg'clRSgSVgl$  one tab daily at 4PM...    Add KCl 65mEq caps-one Bid   Previous Medications   ALPRAZOLAM (XANAX) 0.25 MG TABLET    Take 0.125-0.25 mg by mouth 3 (three) times daily as needed. For nerves   ASPIRIN EC 81 MG TABLET    Take 81 mg by mouth daily.   CLOPIDOGREL (PLAVIX) 75 MG TABLET    Take 1 tablet (75 mg total) by mouth daily with breakfast.   DONEPEZIL (ARICEPT) 5 MG TABLET    Take 5 mg by mouth 2 (two) times daily.   FEEDING SUPPLEMENT (ENSURE COMPLETE) LIQD    Take 237 mLs by mouth 2 (two) times daily between meals.   FUROSEMIDE (LASIX) 40 MG TABLET    Take 1 tablet (40 mg total) by mouth daily.   GABAPENTIN (NEURONTIN) 100 MG CAPSULE    Take 100 mg by mouth 3 (three) times daily.   LISINOPRIL (PRINIVIL,ZESTRIL) 5 MG TABLET    Take 1 tablet (5 mg total) by mouth daily.   MULTIPLE VITAMIN (MULTIVITAMIN) TABLET    Take 1 tablet by mouth daily.     NITROGLYCERIN (NITROSTAT) 0.4 MG SL TABLET    Place 1 tablet (0.4 mg total) under the tongue every 5 (five) minutes x 3 doses  as needed  for chest pain.   OXYBUTYNIN (DITROPAN) 5 MG TABLET    Start with 1/2 tablet two times daily   PHOSPHORUS (K PHOS NEUTRAL) 155-852-130 MG TABLET    Take 250 mg by mouth 2 (two) times daily.  Modified Medications   No medications on file  Discontinued Medications   No medications on file

## 2014-10-06 NOTE — Assessment & Plan Note (Signed)
Recent admission for acute on chronic combined CHF. Discharged 09/29/14. She is currently in rehab and doing well.

## 2014-10-06 NOTE — Assessment & Plan Note (Signed)
SCr 0.96

## 2014-10-06 NOTE — Progress Notes (Signed)
10/06/2014 Jessica [name]   [birth year]  [id number]  Primary Physicia NADEL,SCOTT M, MD Primary Cardiologist: Dr Allyson Sabal  HPI:  80 y/o female, she will be 89 on Monday.  w/ hx HTN, HLD, LE edema, GERD, admitted Sept 2015 w/ SOB that started acutely. ECG was without cute changes, troponin was elevated as well as d-dimer. CTA was negative for PE. Venous dopplers negative for DVT. 2D echo demonstrated mildly reduced systolic function with EF 45-50%. Compared to prior 2D echo in 2010, there is a new wall motion abnormality in the distal LAD artery distribution. Probable anteroseptal MI suspected with probable proximal LAD stenosis. Medical therapy was recommended and she was transferred to SNF for rehab.            She was re admitted 09/23/14 with acute combined CHF exacerbation. Her medications were adjusted and she was discharged back to SNF for rehab 09/29/14. She was seen by Dr Ladona Ridgel with Palliative Care and DNR was confirmed. She was bradycardiac during that admission and beta blocker was discontinued. She is in thew office today for follow up. She saw Dr Kriste Basque this am and BNP and BMP were ordered.     Current Outpatient Prescriptions  Medication Sig Dispense Refill  . ALPRAZolam (XANAX) 0.25 MG tablet Take 0.125-0.25 mg by mouth 3 (three) times daily as needed. For nerves      . aspirin EC 81 MG tablet Take 81 mg by mouth daily.      . clopidogrel (PLAVIX) 75 MG tablet Take 1 tablet (75 mg total) by mouth daily with breakfast.  30 tablet  5  . donepezil (ARICEPT) 5 MG tablet Take 5 mg by mouth 2 (two) times daily.      . feeding supplement (ENSURE COMPLETE) LIQD Take 237 mLs by mouth 2 (two) times daily between meals.  60 Bottle  0  . furosemide (LASIX) 40 MG tablet Take 1 tablet (40 mg total) by mouth daily.  30 tablet  0  . gabapentin (NEURONTIN) 100 MG capsule Take 100 mg by mouth 3 (three) times daily.      Marland Kitchen lisinopril (PRINIVIL,ZESTRIL) 5 MG tablet Take 1 tablet (5 mg total) by  mouth daily.  30 tablet  5  . Multiple Vitamin (MULTIVITAMIN) tablet Take 1 tablet by mouth daily.        . nitroGLYCERIN (NITROSTAT) 0.4 MG SL tablet Place 1 tablet (0.4 mg total) under the tongue every 5 (five) minutes x 3 doses as needed for chest pain.  25 tablet  1  . oxybutynin (DITROPAN) 5 MG tablet Start with 1/2 tablet two times daily  60 tablet  6  . phosphorus (K PHOS NEUTRAL) 155-852-130 MG tablet Take 250 mg by mouth 2 (two) times daily.       No current facility-administered medications for this visit.    No Known Allergies  History   Social History  . Marital Status: Married    Spouse Name: cecil    Number of Children: 2  . Years of Education: N/A   Occupational History  . Retired Database administrator; Manufacturing engineer   Social History Main Topics  . Smoking status: Never Smoker   . Smokeless tobacco: Never Used  . Alcohol Use: No  . Drug Use: No  . Sexual Activity: Not on file   Other Topics Concern  . Not on file   Social History Narrative  . No narrative on file     Review  of Systems: General: negative for chills, fever, night sweats or weight changes.  Cardiovascular: negative for chest pain, dyspnea on exertion, edema, orthopnea, palpitations, paroxysmal nocturnal dyspnea or shortness of breath Dermatological: negative for rash Respiratory: negative for cough or wheezing Urologic: negative for hematuria Abdominal: negative for nausea, vomiting, diarrhea, bright red blood per rectum, melena, or hematemesis Neurologic: negative for visual changes, syncope, or dizziness All other systems reviewed and are otherwise negative except as noted above.    Blood pressure 129/62, pulse 55, height 5\' 4"  (1.626 m), weight 118 lb 8 oz (53.751 kg).  General appearance: alert, cooperative and no distress Lungs: severe kyphoscoliosis. Clear on Lt, decreased Rt base Heart: regular rate and rhythm Extremities: bilateral trace to 1+ edema, she has  compression hose in place  EKG NSR, SB, RBBB, LAFB  ASSESSMENT AND PLAN:   Chronic combined systolic and diastolic CHF, NYHA class 3 Recent admission for acute on chronic combined CHF. Discharged 09/29/14. She is currently in rehab and doing well.  Essential hypertension Controlled  Chronic kidney disease (CKD), stage III (moderate) SCr 0.96   PLAN  I did not change Ms Kobayashi's medications. She can follow up with Dr Allyson SabalBerry in 3 months.   Aryan Sparks KPA-C 10/06/2014 2:50 PM

## 2014-10-07 LAB — BRAIN NATRIURETIC PEPTIDE: Pro B Natriuretic peptide (BNP): 485 pg/mL — ABNORMAL HIGH (ref 0.0–100.0)

## 2014-10-10 NOTE — Progress Notes (Signed)
Quick Note:  Pt's daughter Aram Beecham returned. Spoke with Aram Beecham and discussed lab results with her. Aram Beecham voiced her understanding. She was able to provide the rehab center name: [facility] @ [address] - pt is in room 111.  [facility] and spoke with patient's nurse Misty Stanley. Misty Stanley requested the results be faxed to her at [phone]. This has been done. ______

## 2014-10-10 NOTE — Progress Notes (Signed)
Quick Note:  Pt's daughter Aram Beecham returned. Spoke with Aram Beecham and discussed cxr results with her. Aram Beecham voiced her understanding. She was able to provide the rehab center name: [facility] @ [address] - pt is in room 111.  [facility] and spoke with patient's nurse Misty Stanley. Misty Stanley requested the results be faxed to her at [phone]. This has been done. ______

## 2014-10-25 ENCOUNTER — Ambulatory Visit (INDEPENDENT_AMBULATORY_CARE_PROVIDER_SITE_OTHER): Payer: Medicare Other | Admitting: Pulmonary Disease

## 2014-10-25 ENCOUNTER — Encounter: Payer: Self-pay | Admitting: Pulmonary Disease

## 2014-10-25 ENCOUNTER — Ambulatory Visit (INDEPENDENT_AMBULATORY_CARE_PROVIDER_SITE_OTHER)
Admission: RE | Admit: 2014-10-25 | Discharge: 2014-10-25 | Disposition: A | Discharge status: Home / Self Care | Payer: Medicare Other | Source: Ambulatory Visit | Attending: Pulmonary Disease | Admitting: Pulmonary Disease

## 2014-10-25 ENCOUNTER — Other Ambulatory Visit (INDEPENDENT_AMBULATORY_CARE_PROVIDER_SITE_OTHER): Payer: Medicare Other

## 2014-10-25 ENCOUNTER — Telehealth: Payer: Self-pay | Admitting: Pulmonary Disease

## 2014-10-25 VITALS — BP 122/64 | HR 55 | Temp 98.8°F | Ht 60.0 in | Wt 114.8 lb

## 2014-10-25 DIAGNOSIS — G629 Polyneuropathy, unspecified: Secondary | ICD-10-CM

## 2014-10-25 DIAGNOSIS — R0600 Dyspnea, unspecified: Secondary | ICD-10-CM

## 2014-10-25 DIAGNOSIS — R1312 Dysphagia, oropharyngeal phase: Secondary | ICD-10-CM

## 2014-10-25 DIAGNOSIS — M810 Age-related osteoporosis without current pathological fracture: Secondary | ICD-10-CM

## 2014-10-25 DIAGNOSIS — R531 Weakness: Secondary | ICD-10-CM

## 2014-10-25 DIAGNOSIS — R269 Unspecified abnormalities of gait and mobility: Secondary | ICD-10-CM

## 2014-10-25 DIAGNOSIS — I10 Essential (primary) hypertension: Secondary | ICD-10-CM

## 2014-10-25 DIAGNOSIS — N183 Chronic kidney disease, stage 3 unspecified: Secondary | ICD-10-CM

## 2014-10-25 DIAGNOSIS — I5042 Chronic combined systolic (congestive) and diastolic (congestive) heart failure: Secondary | ICD-10-CM

## 2014-10-25 DIAGNOSIS — G458 Other transient cerebral ischemic attacks and related syndromes: Secondary | ICD-10-CM

## 2014-10-25 DIAGNOSIS — M150 Primary generalized (osteo)arthritis: Secondary | ICD-10-CM

## 2014-10-25 DIAGNOSIS — I2510 Atherosclerotic heart disease of native coronary artery without angina pectoris: Secondary | ICD-10-CM

## 2014-10-25 DIAGNOSIS — F411 Generalized anxiety disorder: Secondary | ICD-10-CM

## 2014-10-25 DIAGNOSIS — R609 Edema, unspecified: Secondary | ICD-10-CM

## 2014-10-25 DIAGNOSIS — I872 Venous insufficiency (chronic) (peripheral): Secondary | ICD-10-CM

## 2014-10-25 DIAGNOSIS — F0390 Unspecified dementia without behavioral disturbance: Secondary | ICD-10-CM

## 2014-10-25 DIAGNOSIS — M159 Polyosteoarthritis, unspecified: Secondary | ICD-10-CM

## 2014-10-25 DIAGNOSIS — I4510 Unspecified right bundle-branch block: Secondary | ICD-10-CM

## 2014-10-25 DIAGNOSIS — K219 Gastro-esophageal reflux disease without esophagitis: Secondary | ICD-10-CM

## 2014-10-25 LAB — BASIC METABOLIC PANEL
BUN: 14 mg/dL (ref 6–23)
CO2: 40 mEq/L — ABNORMAL HIGH (ref 19–32)
CREATININE: 1.1 mg/dL (ref 0.4–1.2)
Calcium: 9.8 mg/dL (ref 8.4–10.5)
Chloride: 98 mEq/L (ref 96–112)
GFR: 60.52 mL/min (ref >60.00)
Glucose, Bld: 115 mg/dL — ABNORMAL HIGH (ref 70–99)
POTASSIUM: 2.7 meq/L — AB (ref 3.5–5.1)
Sodium: 143 mEq/L (ref 135–145)

## 2014-10-25 LAB — BRAIN NATRIURETIC PEPTIDE: Pro B Natriuretic peptide (BNP): 311 pg/mL — ABNORMAL HIGH (ref 0.0–100.0)

## 2014-10-25 NOTE — Progress Notes (Signed)
Subjective:    Patient ID: Jessica [name], female    DOB: 1911/11/17, 80 y.o.   MRN: [id number]  HPI 80 y/o BF here for a follow up visit...  She has mult medical problems including:  AR & runny nose;  Asthmatic Bronchitis;  HBP;  Chronic VI & edema;  Hyperchol;  GERD & esoph stricture;  Divertics;  Hx Urinary incontinence & UTIs;  DJD/ Osteoporosis;  Semile Dementia;  Anxiety...  SEE PREV EPIC NOTES FOR EARLIER DATA >>  ~  December 03, 2012:  64moROV & Maliah's CC= swelling in feet; her wt= 114# up 1# on Demadex20, Diamox250, K10-3/d, and KPhos-Bid; reminded to elim sodium, elev legs, wear support hose...  She is [place] a [place] of [place]/ [place], son helps but works...      We reviewed prob list, meds, xrays and labs> see below for updates >>  LABS 12/13:  Chems- ok w/ K=3.5 Creat=1.1 Ca=10.8 Phos=3.6 PTH=122 (c/w hyperparathyroidism)...  We decided to continue her same meds for now...  ~  January 08, 2013:  148moOV & JuShalaeontinues to insist on monthly ROVs- c/o pedal edema about the same as prev, wt stable 114# despite running out of diuretics & we discussed refilling her Demadex20 & Diamox250;  BP stable, O2 sats are 99% on RA, chest clear, etc...  She persists w/ bladder complaints and she is reminded about the double voiding technique per DrTannenbaum & rec for Urology f/u prn...  ~  March 23, 2013:  2-43m54moV & JulJezelle c/o ankle swelling which tends to go down overnight- we discussed stopping her Amlodipine & redoubling efforts at no salt, elevation, support hose, & Demadex;  also c/o fine rash ?generalized w/ pruritis & we discussed Rx w/ Lotrisone cream & Atarax 38m243mn... She is once again asking for yet another round of home PT therapy...  We reviewed the following medical problems during today's office visit >>     HBP> on ASA81, Amlod2.5, Lisin5, Demadex20-1/2, Diamox250, & K10-3/d + KPhos-1Bid; BP=100/62 & she is feeling sl weak w/ mult somatic complaints; labs 4/14 show K=3.7,  Phos=3.4, Cr=1.1; we decided to STOP the Amlodipine & work on sodium restriction.    VI, edema> she knows to elim sodium, elev legs, wear support hose, take Demadex 20mg66m.    Chol> on diet alone; she has a very hi HDL ~150!!!    Hypercalcemia> assoc w/ low phos- c/w primary hyperparathyroidism- she is 80y/o & not felt to be an operative candidate; [facility] in serum Phos to 3.6; serum Ca = 11.2 now but PTH is not rising.    GI- GERD, Divertics> off prev Prilosec20, Miralax, Senakot-S; advised these all as needed...    GU> saw DrTannenbaum for Urology 10/12- mult complaints, urodynamics done w/ sm hypersens bladder unstable w/ leakage, & large bladder divertic> he rec double voiding technique...    DJD, Osteopenia> off Ca supplements they sent her home from hosp w/ Phos-2Bid...    Senile Dementia> on Aricept10 & Alpraz0.25 as needed... We reviewed prob list, meds, xrays and labs> see below for updates >>   LABS 4/14:  Chems- Ca=11.2 but PTH is sl lower than prev at 86;  Phos=3.4 on KPhos1tabBid; K= 3.7 on this & K10- 2AM+1PM;  LFTs= wnl and Alb= 4.0 on Ensure...  ~  May 20, 2013:  106mo R58mo add-on for swelling> Aleysia Mandolinsts w/ c/o swelling in  her legs; ?if she recalls our prev conversation about salt, elevation, support hose; she is taking the Demadex165mQam & Diamox25105mQpm along w/ her KCl & KPhos=> labs today show: K=3.6, BUN=17, Cr=1.0;  Exam shows 3+edema in feet & lower legs, chest clear, heart w/ Gr1/6 SEM w/o rubs or gallops apprec;  We reviewed the need to Elim salt, Elevate the legs, Wear support hose, & we decided to STOP Lisinopril (BP=110/68) and INCREASE the Demadex20 to 2tabsQam...  ROV in 4-6weeks recheck; hopefully she can keep her visiting nurses, home help, etc (she will not go willingly to a NH or AL facility)...     We reviewed prob list, meds, xrays and labs> see below for updates >>   ~  June 23, 2013:  65m259moV &  JulAsmara here w/ her daugh & her husb CecLaveda Abbeast seen 12/11 & asked to f/u in 59mo38moLast OV we increased her Demadex to 20-2Qam + her Diamox250/d along w/ her KCl & KPhos- she is improved having lost 3# down to 110# & less edema; BP is improved as well at 120/62;  Her CC today is some burning discomfort in the bottom of her feet esp when standing &walking; we discussed Neuropathy symptoms & she requests med rx trial- try Gabapentin 100->300mg27m.  We reviewed prob list, meds, xrays and labs> see below for updates >>   Labs 7/14:  Chems- wnl x Ca=10.8, continue same meds...  ~  August 24, 2013:  58mo R558mo Jihan says she was Dx w/ Scabies yest by Derm iPayton MccallumS, we do not have notes; she says she got it from her husb who picked it up while in rehab; her CC is itching & hopefully the Kwell lotion will take care of all this; she states that her neuropathy sx are better on the Gabapentin100Tid; She will turn 89 y/69ext month...  BP remains well controlled on diuretics alone & measures 116/64 today... We reviewed prob list, meds, xrays and labs> see below for updates >>  OK 2014 FLU vaccine today...   ~  October 25, 2013:  58mo RO40moJulia hHolidayrned 80y/o & she framed her letter from Obama; states she's doing fair- same old problems, pain in left shoulder & she wants more PT, but I offered Ortho eval for XRay & poss injection instead...     BP is controlled on ASA81, Demadex20-2Qam, Diamox250, & K10-3/d + KPhos-1Bid (off prev Amlod & Lisin); BP=122/70 & she denies angina pain, palpit, ch in SOB etc; K is sl low at 3.2 7 rec to incr KCl10 to 2Bid...    Serum Calcium id ok at 10.3- no progressive incr & we continue to monitor this parameter...    DJD is treated w/ OTC analgesics and rec to f/u w/ Ortho re: shoulder pain...    She has a senile dementia- on Aricept10 and Alpraz0.25 for prn use... We reviewed prob list, meds, xrays and labs> see below for updates >>   LABS 11/14:  Chems- ok x K=3.2,  BUN=24, Cr=1.1;  CBC w/ Hg=11.8;  TSH=2.48...  ~  March 15, 2014:  38mo ROV365moulia has the same chr complaints- generally stable, notes leaking bladder (prev eval DrTannenbaum), burning feet (saw Podiatry-DrRegal), occas indigestion... She notes that she will be [facility] to check her ears for lavage...     She notes that her breathing is fine- no recent resp  exac, cough, sputum, ch in SOB, etc...    BP= 106/64 on Demadex20Qam & Diamox250Qpm w/ K10-2Bid and KPhos1Bid; see labs below...    She ambulates w/ cane & walker, denies CP/ palpit/ ch in edema...     She notes some urinary incont & wears depends, no burning etc...    She remains on Aricept5Bid- she prefers it this way & won't change... We reviewed prob list, meds, xrays and labs> see below for updates >> she did not bring med list or med bottles & reminded to bring bottles to every OV...   LABS 1/15:  Chems- ok w/ K=3.5, Cr=1.2  ~  May 20, 2014:  65moROV & Adonna is stable, same chr complaints which she like to discuss Q24mo.     HBP> on ASA81, Demadex20-2Qam, Diamox250-at 4pm, & K10-2Bid + KPhos-1Bid; BP=100/60 & labs 6/15 show K=4.0, Phos=2.5, Cr=1.1; continue same meds for now...    VI, edema> she knows to elim sodium, elev legs, wear support hose, take Demadex & Diamox as directed...    Chol> on diet alone; she has a very hi HDL ~150 when measured in 2012...    Hypercalcemia> assoc w/ low phos- c/w primary hyperparathyroidism- she is 80y/o & not felt to be an operative candidate; we are following Ca, PO4, PTH levels...     GI- GERD, Divertics> off prev Prilosec20, Miralax, Senakot-S; advised these all as needed...    GU> saw DrTannenbaum for Urology 10/12- mult complaints, urodynamics done w/ sm hypersens bladder unstable w/ leakage, & large bladder divertic> he rec double voiding technique...    DJD, Osteopenia> off Ca supplements, on KPhos-2Bid, on MVI & encouraged to  walk as much as poss...    Senile Dementia> on Aricept10 & Alpraz0.25 as needed... We reviewed prob list, meds, xrays and labs> see below for updates >>   LABS 6/15:  Chems- ok x Ca=11.2, PO4=2.5, PTH=108 c/w primary hyperpara;  CBC- Hg=12.3;  TSH=2.09...   ~  July 20, 2014:  4m23moV & JulDaviellemains stable overall- states that she has been doing fairly well but has a list of questions and wants me to re-order visiting nurses to check on her & husb every week or so (I explained that [facility] & see what she is eligible for under Medicare)...     1) she wants a nasal spray for drippy nose> try ASTELIN 1-2 sp in each nostril Bid as needed...    2) she is c/o burning discomfort in legs & we reviewed her Neuropathy Dx & the reason for the Gabapentin Rx- she is only taking 100m50md & gets some benefit from this she says; offered to incr dose but she can't remember to take it Tid 7 dosen't want to incr to 2Bid so we decided to keep it the same for now...    3) she is c/o urinary symptoms- leaking, some incont, denies burning/ odor/ pain/ blood etc; we discussed checking UA & C&S; we reviewed prev evals from DrNesi & DrTannenbaum- offered ROV w/ these providers but she declined; finally agreed to try Ditropan5mg-36m2tab bid for her symptoms and let me know if she is better...    4) she wants f/u labs and is due for BMet & Phos level (f/u hyperpara on observation)...  We reviewed prob list, meds, xrays and labs> see below for updates >> she brought med bottles and we reviewed her Rx today...  LABS 8/15:  Chems- ok  x K=3.1 and Cr=1.2;  Phos=2.9;  UA= clear...   ~  September 20, 2014:  66moROV & post hosp visit> JYuriwas Adm by Cards 9/14 - 09/02/14 w/ SOB, she denied CP, EKG was unchanged but Troponins were elev, D-dimer was also abn & a CTAngioChest was done- neg for PE, and VenDopplers were neg for DVT; she was treated w/ Heparin transiently;  2DEcho showed decr LVF w/  EF=45-50% w/ areas of HK, & Gr1DD w/ mild AS/AI- felt to have prob anteroseptal MI w/ distal LAD lesion likely but she refused invasive eval & was made a NCB/ DNR/ med rx only;  Meds were adjusted w/ Hep=> Plavix added, BBlocker continued, and ACE added; [facility]...  In the rehab facility she was managed by the SRoanntheir notes are reviewed...  She was disch home 10/2 w/ home healt PT/ OT/ CNA/ Nursing/ and Social worker all arranged...  After disch she returned to the ER 10/3 w/ paresthesias and weakness> seen by ER staff & Neuro consultant; BP was recorded at 180-190 sys, CT Brain & MRI were neg for acute infarct & she was felt to have had a TIA- already on Plavix & w/ nothing further to offer she was sent home...  She comes in today w/ daugh & appears weaker, BP is low at 92/64, marked increase in leg edema since off her diuretics (stopped during recent hosp w/ her NSTEMI), going downhill but ever stoic she says she is doing satis at home w/ BSC, walker, etc; they truly love the home health care assist w/ Pt/ OT/ HHN/ aides/ & social worker; they again decline to consider NHP for pt & her husband...  We reviewed the following medical problems during today's office visit >>     HBP> prev on Demadex20-2Qam, Diamox250-at 4pm, & K10-2Bid + KPhos-1Bid; but now off all diuretics on MetopER25-1/2 daily, Lisinopril5 & KPhos-1Bid; BP=92/64 & no wiggle room to add back diuretics=> she will f/u w/ Cards soon...     ASHD, s/p NSTEMI, ischemic cardiomyopathy, combined sys & diast CHF> Adm 9/15 by Cards w/ NSTEMI & combined sys/diast CHF; she declined intervention; medical therapy adjusted- off diuretics, added MetopER12.5/ Lisin5/ ASA81/ Plavix75, but edema incr.    VI, edema> off diuretics now per Cards adjustment 9/15; she knows to elim sodium, elev legs, wear support hose; she has f/u appt w/ Cards soon...    Chol> on diet alone; she has a very  hi HDL ~150 when measured in 2012...    Hypercalcemia> assoc w/ low phos- c/w primary hyperparathyroidism- she is 80y/o & not felt to be an operative candidate; we are following Ca, PO4, PTH levels...     GI- GERD, Divertics> off prev Prilosec20, Miralax, Senakot-S; advised these all as needed...    GU> saw DrTannenbaum for Urology 10/12- mult complaints, urodynamics done w/ sm hypersens bladder unstable w/ leakage, & large bladder divertic> he rec double voiding technique & Ditropan5-1/2Bid...    ?TIA, Neuropathy> on Neurontin100Tid; she went to the ER 10/15 one day after disch from the NH w/ numbness=> NEG CTBrain & MRI w/o acute changes, symptoms passed & sent home on same ASA81 + Plavix75/d...    DJD, Osteopenia> off Ca supplements, on KPhos-1Bid, on MVI & encouraged to walk as much as poss...    Senile Dementia> on Aricept5 & Alpraz0.25 as needed... We reviewed prob list, meds, xrays and labs> see  below for updates >>   LABS 10/15:  FLP- ok on diet alone w/ HDL=159!  Chems- ok w/ Cr=1.01, K=4.1, BS=104, A1c=6.0, Ca=10.8;  CBC- Hg=11.7;  TSH=2.48...  CXR 9/15 showed mild cardiomeg, clear lungs, poor inspiration, osteopenia, scoliosis w/o change, NAD.Marland KitchenMarland Kitchen  EKG 9/15 showed SBrady, rate47, PACs, RBBB, left axis deviation, NSSTTWA...   2DEcho 9/15 showed mild LVH, reduced LVF w/ EF=45-50% w/ HK in apex & septum, Gr1DD, mild AS/AI, mildly thickened MV leaflets & mild LAdil, PAsys=40...  CT Angio Chest 9/15 showed neg for PE, mildly enlarged cardiac chambers, Ao looked ok, COPD/E bilat w/ atx vs scarring at bases, scoliosis & DDD, ?nonobstructing stone in mid right kidney...  CT Head 10/15 showed mild diffuse atrophy, no acute changes...  MRI Head 10/15 revealed no infarct, hemorrhage, or mass; mild atrophy & sm vessel dis, NAD...  PLAN>> she has gained 19# since last here w/ marked edema (4+bilat in legs) since she's been off her diuretics; however BP is low at 92/64 on her low dose Metop & Lisin  per Cards; advised no salt/ elev legs/ support hose/ & f/u w/ Cards ASAP as planned (not many options here- perhaps stop BBlocker, add diuretic, consider pacer);  She is happy w/ all the home health support...  ~  October 06, 2014:  2wk ROV & post-hosp visit> after last OV she was hosp by [facility] from 10/9 - 09/29/14 w/ incr SOB (O2sat=84% on RA), edema (BNP=9280, CXR w/ pulm edema), and weakness; but no CP or other symptoms and troponins were neg;  She was diuresed and her meds were adjusted (Metoprolol stopped due to low heart rate); disch to NHP for rehab and she is loving the attention "they are working with me"- on Lasix40 & KPhos bid, plus her ASA/ Plavix/ Lisin5...  Her CC is constipation> going every 3-4 days & firm, no blood reported; she is rec to start a regimen w/ Miralax daily & Senakot-S 2Qhs... We discussed rechecking her CXR (improved aeration) and LABS (K=3.1, HCO3=42, Cr=1.1, BNP=485) today... REC to continue Lasix40, Add back the Diamox250/d & the KCl- 54mq caps 2/d as before... ROV in 2-3 weeks...     We reviewed prob list, meds, xrays and labs> see below for updates >>   CXR 10/15 Hosp showed cardiomeg, engorged pulm vasculature, increased markings, can't r/o effusions, all c/w pulm edema...   CXR 10/15 office f/u showed cardiomeg, improved aeration, persist bilat effusions...  LABS 10/15 office f/u>  Chems- K=3.1, HCO3=42, BUN=16, Cr=1.1, Ca=10.4, BNP=485...   ~  October 25, 2014:  3wk ROV & JChantriceis still in [employer]& to be released home in 3d her daughter says; [facility] home health support w/ visiting nurses, PT/ OT, etc; Phys therapy has checked her & the home situation & they feel she is safe for DC home; Daughter needs schooling on pt's home care needs, feeding, use of "thick-it", and wants to know "how to keep the pill box filled"...  JDelaneehad [facility] f/u 10/22 w/ PA-Luke & they felt she was doing OK, no changes made, f/u 350mo I  note that the Diamox & KCl we tried to add after her last OV here is not on her list- ?what happened? Since she was here last she has lost another 5# but still has 2+edema in LEs, breathing is OK, she is avoiding salt she says;    HBP, ASHD, s/pNSTEMI, ischemic cardiomyop (EF=45-50% w/ HK), combined sys&diast CHF,  etc> on ASA81, Plavix75, Lisin5, Lasix40, KPhos2/d; Prev ordered Diamox250 & K10 are not on her list; BP= 122/64 & she denies ch in SOB, edema (2+), etc;  Labs w/ K=2.7, HCO3=40, Cr=1.1, BNP=311;  CXR w/ sm bilat effusions persist;  Plan is to add the diamox250 one daily at 4PM, & KCl46mqBid, continue other meds the same...     On Miralax, Senakot, Ensure> not taking PPI now; Hx GERD, Divertics; needs thick-it for her fluids per speech path & daughter must be taught...    On Ditropan5-1/2Bid for bladder...    On Neurontin100Tid for neuropathy...    On Aricept5 for memory & Alprazolam 0.25 prn nerves...  We reviewed prob list, meds, xrays and labs> see below for updates >> we reconciled med lists   CXR 11/15 showed small bilat effusions and rounded area ?fluid in fissure) on left lat chest...  LABS 11/15:  Chems- K=2.7, HCO3=40, Cr=1.1;  BNP=311... PLAN>>  Continue meds, we started KCl 280m Bid and added DIAMOX25059mne tab at 4PM daily... ROV in 3 wks.         Problem List:  Hx of ALLERGIES (ICD-995.3) - she uses OTC meds as needed, & we prev discussed Zyrtek 81m4m, Saline nasal mist, & Atrovent Nasal 0.03% Tid for drainage (one of her chronic complaints)... ~  8/15: we tries Astelin nasal spray for her chr complaints of dripping...  Hx of ASTHMATIC BRONCHITIS, ACUTE (ICD-466.0) - no recent exacerbations... ~  CXR 5/13 showed borderline heart size, kyphoscoliosis, no edema, and clear lungs... ~  CXR 10/13 showed normal heart size, clear lungs, kyphoscoliosis, osteopenia... ~  CXR 10/15> Hosp w/ CHF (cardiomeg, bilat effusions and interstitial edema); she was diuresed and disch to  NHP/rehab=> f/u CXR 10/15 w/ improved aeration, persist bilat effusions... ~  CXR 11/15 showed small bilat effusions and rounded area ?fluid in fissure) on left lat chest.  HYPERTENSION (ICD-401.9) >>  ~  controlled on ASA 81mg77mNORVASC5mg-162mab, LISINOPRIL5 & DEMADEX 20mg/d24mIAMOX250mg/d.34m  4/12:  BP= 110/60 and she states that she is taking her meds regularly & tolerating them well... denies HA, visual changes, CP, palpit, change in dyspnea, etc... but notes weakness, intermittently dizzy & persist edema in her feet...  ~  6/12:  BP= 124/70 & she remains stable... ~  8/12:  BP= 110/ 66 & stable... ~  10/12:  BP= 114/62 & stable... ~  1/13:  BP= 110/64 & she remains stable... ~  4/13:  BP= 116/58 & she is reminded to adjust Lasix 1-2 Qam according to her edema... ~  5/13:  BP= 140/72 & she is c/o pedal edema; we decided to change to Demadex 20mg- 2Q31m~  7/13:  BP= 110/60 & her edema is diminished, still 1+ in feet... Labs showed HCO3=39 & DIAMOX 250mg/d ad81m ~  8/13:  BP= 118/74 & edema is the same; ?what she's taking? & we decided to simplify regimen> Norvasc1/2, Lisin1/2, Demadex-1/d & Diamox-1/d... ~  9/13:  BP= 110/70 & she is clinically stable on these meds; add K10Bid for the K=3.4 today... ~  EKG 10/13 showed NSR, rate63, RBBB, poss old infarct anterolaterally... ~  11/13:  on ASA81, Amlod2.5, Lisin5, Demadex20-1/2, Diamox250, & K20/d; BP=122/64 & she is feeling better, still w/ mult somatic complaints... ~  12/13:  BP= 124/72 & her edema, weight, etc are about stable... ~  1/14:  BP= 118/60 & she persists w/ mult somatic complaints... ~  4/14:  on ASA81, Amlod2.5, Lisin5, Demadex20-1/2, Diamox250, & K10-3/d +  KPhos-1Bid; BP=100/62 & she is feeling sl weak w/ mult somatic complaints; labs 4/14 show K=3.7, Phos=3.4, Cr=1.1; we decided to STOP the Amlodipine & work on sodium restriction ~  6/14: on ASA81, Lisiin5, Demadex20, Diamox250, & K10-3/d + KPhos-1Bid; BP= 110/68 &  3+edema persists; Labs- OK & we decided to STOP the Lisinopril & INCR the Demadex20-2Qam.... ~  7/14: on  ASA81, Demadex20-2/d, Diamox250, & K10-3/d + KPhos-1Bid; BP= 120/62 & she is improved w/ decr edema... ~  9/14:  BP remains well controlled on diuretics alone & measures 116/64 today.. ~  11/14: BP is controlled on ASA81, Demadex20-2Qam, Diamox250, & K10-3/d + KPhos-1Bid (off prev Amlod & Lisin); BP=122/70 & she denies angina pain, palpit, ch in SOB etc; K is sl low at 3.2 7 rec to incr KCl10 to 2Bid. ~  3/15: BP= 106/64 on Demadex20Qam & Diamox250Qpm w/ K10-2Bid and KPhos1Bid... ~  6/15:  on ASA81, Demadex20-2Qam, Diamox250-at 4pm, & K10-2Bid + KPhos-1Bid; BP=100/60 & labs 6/15 show K=4.0, Phos=2.5, Cr=1.1; continue same meds for now. ~  8/15: on ASA81, Demadex20-2Qam, Diamox250 at 4pm & K10-2/d;  BP= 120/62, continue same... ~  9/15: she was Hosp by [facility] w/ MI, cardiomyopathy w/ combined sys & diast CHF, & she refused invasive procedures/ cath etc; disch to [facility], then back home on ASA81, Plavix75, MetopER12.5, Lisin5, KPhos-Bid, & off all diuretics...  ~  2DEcho 9/15 showed mild LVH, reduced LVF w/ EF=45-50% w/ HK in apex & septum, Gr1DD, mild AS/AI, mildly thickened MV leaflets & mild LAdil, PAsys=40... ~  10/15: ret to office weaker w/ 19# wt gain & 4+edema off her diuretics w/ BP= 92/64, wt up 19# w/ 4+edema=> she was re-hosp w/ pulm edema, BNP=9280, given IV Lasix & med adjustment- disch on ASA81, Plavix75, Lasix40, Lisin5, KPos-Bid=> now w/ decr edema & BP=124/62 but f/u labs showed persist bilat pleural effusions, K=3.1, HCO3=42, Cr=1.1, BNP=485... REC to continue current meds, add Diamox250 at 4PM daily, add KCl-64mq caps 2/d but this wasn't donne by the NH... ~  11/15: She has lost another 5# & breathing is stable> CXR w/ persistent sm bilat effusions & ?fluid in fissure; Labs showed K=2.7, HCO3=40, Cr=1.1, & BNP=311; we decided to add the Diamox250 & K20Bid  VENOUS  INSUFFICIENCY, CHRONIC (ICD-459.81) - there is mild pedal edema bilat L>R... on low sodium diet, elevation, support hose- prev refused diuretic meds due to urinary symptoms, [facility]...  HYPERCHOLESTEROLEMIA (ICD-272.0) - prev on Lip10, but she stopped this on her own... she has a very high HDL!!! ~  FLP was 1/08 showed TChol 253, TG 125, HDL 121, LDL 93 ~  FLP 2/09 showed TChol 200, TG 99, HDL 136, LDL 44 ~  FLP 4/11 showed TChol 244, TG 143, HDL 149, LDL 59 ~  FLP 1/12 showed TChol 266, TG 102, HDL 151, LDL 83 >> BEST HDL I'VE EVER SEEN ~  It has been hard to get her back in for FASTING labs... ~  [facility] alone showed TChol 222, TG 61, HDL 159, LDL 51  HYPERCALCEMIA >> routine labs w/ Calcium levels betw 10.2 & 11.7 over the last 221yr.. ~  Labs 7/13 showed Ca= 11.8 &  PTH= 81... For now avoid calc supplements etc... ~  Labs 9/13 showed Ca= 11.0 & she is reminded- no calcium, no VitD... ~  Labs 10/13 Hosp reviewed> Ca was up, Phos was low, K was low> all improved w/ Rx in hosp (they did  not repeat PTH level). ~  4/14:   assoc w/ low phos- c/w primary hyperparathyroidism- she is 80y/o & not felt to be an operative candidate; she was started on KPhos 10/13 in [place] on 2Bid w/ improvement in serum Phos to 3.6; serum Ca = 11.2 now but PTH is not rising.  ~  Labs 11/14 showed Ca=10.3 ~  Labs 1/15 showed Ca= 10.5 ~  6/15:  Labs showed  Ca=11.2, PO4=2.5, PTH=108 c/w primary hyperparathyroidism; we are following... ~  Labs 8/15 improved w/ Ca= 10.4, Phos= 2.9  GERD (ICD-530.81) - last EGD was 9/05 by DrPerry showing GERD and esoph stricture- dilated... she stopped her Nexium but states that her swallowing is good & she uses PRILOSEC OTC as needed.  DIVERTICULOSIS OF COLON (ICD-562.10) - last colonoscopy was 11/00 showing divertics only... ~  8/12:  She notes some constipation & rec to take Holden per our usual protocol...  HX, URINARY INFECTION  (ICD-V13.02) & INCONTINENCE symptoms... she has seen DrTannenbaum & staff on bladder exercises and she reported some improvement but has persistant symptoms & never followed up... ~  7/10:  offered f/u appt w/ Urology vs second opinion consult but she declines... ~  9/10:  wrote for trial Enablex 7.61m/d >> no benefit she says. ~  12/10: saw DrNesi- tried sample med, sl improved, but "he turned me over to a lady for longer term treatments" she says... ~  4/12 & 6/12: states DrNesi hasn't been able to help her & she wants appt w/ DrTannenbaum==> seen w/ urodynamic eval revealing a sm hypersens bladder & a large bladder divertic; not a surg candidate, taught double voiding technique... ~  10/12: she had f/u DrTannenbaum & he reinforced prev w/u & need for double voiding technique... ~  10/13: In hosp Urine grew mult species, Rx w/ Roceph=> Cipro...  DEGENERATIVE JOINT DISEASE (ICD-715.90) - notes right shoulder symptoms, but managing OK w/ Mobic 7.521m& Tylenol; also right hip & low back pain...  she also c/o neuropathic "burning" in legs but not bad enough for additional meds she says... ~  8/14: [facility] pt c/o bilat foot & ankle pain & burning w/ numbness & tingling; Dx w/ somatic dysfunction in lumbar region; subluxations seen on XRays; given an adjustment... ~  9/14: symptoms improved on Gabapentin 10055mid she says...  OSTEOPOROSIS (ICD-733.00) - she was on Fosamax but had discontinued this on her own... we discussed the need for continued therapy and she was asked to restart Alendronate but she never did...  she takes Ca++, MVI, VitD==> rec to STOP the Calcium.  SENILE DEMENTIA (ICD-290.0) - she takes ASA 91m37m.. she tried Aricept in the past but didn't notice any improvement therefore stopped it. ~  she & her husb still live on their own but are having numerous difficulties while still resisting any thoughts of assisted living etc> got  concerned because she saw an impulse in her left antecubital fossa (tortuous brachial art), went to the ER & this got translated to palpitations & lead to full ER cardiac eval= neg... ~  6/11: MMSE showed 26/30 & she agreed to try DONEPEZIL 10mg7m. ~  6/12:  Still taking Aricept & appears stable... ~  7/13:  No change, same meds, clinically stable... ~  7/14:  She remains stable and will be 89 y83in Oct... ~  3/15:  She remains on Aricept5Bid- she prefers it this way &  won't change...  ANXIETY (ICD-300.00) - she uses Alpraz 0.73m as needed.  Hx of SHINGLES (ICD-053.9)   Past Surgical History  Procedure Laterality Date  . Vesicovaginal fistula closure w/ tah    . Tonsillectomy    . Abdominal hysterectomy      Outpatient Encounter Prescriptions as of 10/25/2014  Medication Sig  . ALPRAZolam (XANAX) 0.25 MG tablet Take 0.125-0.25 mg by mouth 3 (three) times daily as needed. For nerves  . aspirin EC 81 MG tablet Take 81 mg by mouth daily.  . clopidogrel (PLAVIX) 75 MG tablet Take 1 tablet (75 mg total) by mouth daily with breakfast.  . donepezil (ARICEPT) 5 MG tablet Take 5 mg by mouth 2 (two) times daily.  . feeding supplement (ENSURE COMPLETE) LIQD Take 237 mLs by mouth 2 (two) times daily between meals.  . furosemide (LASIX) 40 MG tablet Take 1 tablet (40 mg total) by mouth daily.  .Marland Kitchengabapentin (NEURONTIN) 100 MG capsule Take 100 mg by mouth 3 (three) times daily.  .Marland Kitchenlisinopril (PRINIVIL,ZESTRIL) 5 MG tablet Take 1 tablet (5 mg total) by mouth daily.  . Multiple Vitamin (MULTIVITAMIN) tablet Take 1 tablet by mouth daily.    . nitroGLYCERIN (NITROSTAT) 0.4 MG SL tablet Place 1 tablet (0.4 mg total) under the tongue every 5 (five) minutes x 3 doses as needed for chest pain.  .Marland Kitchenoxybutynin (DITROPAN) 5 MG tablet Start with 1/2 tablet two times daily  . phosphorus (K PHOS NEUTRAL) 155-852-130 MG tablet Take 250 mg by mouth 2 (two) times daily.  . polyethylene glycol (MIRALAX /  GLYCOLAX) packet Take 17 g by mouth daily.    No Known Allergies   Current Medications, Allergies, Past Medical History, Past Surgical History, Family History, and Social History were reviewed in [employer].    Review of Systems        See HPI - all other systems neg except as noted...      The patient complains of decreased hearing, dyspnea on exertion, peripheral edema, incontinence, muscle weakness, and difficulty walking.  The patient denies anorexia, fever, weight loss, weight gain, vision loss, hoarseness, chest pain, syncope, prolonged cough, headaches, hemoptysis, abdominal pain, melena, hematochezia, severe indigestion/heartburn, hematuria, suspicious skin lesions, transient blindness, depression, unusual weight change, abnormal bleeding, enlarged lymph nodes, and angioedema.     Objective:   Physical Exam      WD, WN, 80 y/o BF in NAD... GENERAL:  Alert, pleasant & cooperative; she is slow moving as noted... HEENT:  [place]/AT, EOM-full, EACs- some wax, TMs-wnl, NOSE-clear, THROAT-clear & wnl. NECK:  Supple w/ fairROM; no JVD; normal carotid impulses w/o bruits; no thyromegaly or nodules palpated; no lymphadenopathy. CHEST:  Clear to P & A; without wheezes/ rales/ or rhonchi., noted kyphosis... HEART:  Regular Rhythm; Gr1/6 SEM, without rubs or gallops heard... ABDOMEN:  Soft & nontender; normal bowel sounds; no organomegaly or masses detected. EXT:  mod arthritic changes; no varicose veins/ +venous insuffic/ 3+edema now deficits... DERM:  No lesions noted; no rash etc...  MMSE> 06/13/10> Score 26/30 (missed place, 2/3 recall after distraction, & copy figure)...   Assessment & Plan:    ASHD, s/p NSTEMI, combined sys/diast CHF, edema>  [facility] she declined invasive options, treated medically, but going downhill w/ hypotension and marked incr edema, she will f/u w/ Cards regarding options avail... 10/15> we tried to add  diamox250 & K10 but neither were started as requested in the NH... 11/15>  we have ordered Diamox250 & K20Bid to be added to her meds (see labs above)...  AR & ASTHMA>  Breathing is stable, at baseline, she has chr rhinorrhea complaints w/ numerous discussions regarding management of this problem....  Ven Insuffic/ Edema>>  We have had mult conversations at each OV regarding no salt, elevation, support hose; she is now off the Demadex, Diamox w/ marked incr in edema=> has f/u w/ Cards soon to see if meds can be adjusted further & diuretic restarted.  CHOL>  She has the world's best HDL!!!  Electrolye Abn>  See 10/13 Hosp by [facility], it is apparent that her elev calcium is the result of HYPERPARATHYROIDISM but at age 46 we are reluctant to refer for surg unless progressive... We are following her serum Calcium level & non-progressive. Hypercalcemia>  Surely from Hyperpara & she has stopped the calcium supplements, & we are following the labs...  GU>  She saw DrTannenbaum w/ extensive investigation into her voiding symptoms & urodynamics as above; rec to use "double voiding" technique... 8/15> still complaining & declines ret to Urology; try Ditropan 79m- 1/2 tab Bid...  DJD/ Osteopenia>  Aware, she is stable on OTC meds, she has repeatedly refuse Bisphos meds for her bones...  TIA, Dementia>  On ASA81, Plavix75; She & husb CLaveda Abbeare still living independently w/ daugh help but they are very fragile yet won't consider NHP...  Other medical problems as noted...   Patient's Medications  New Prescriptions             DIAMOX 256m- take one tab po daily at 4PM...  Previous Medications   ALPRAZOLAM (XANAX) 0.25 MG TABLET    Take 0.125-0.25 mg by mouth 3 (three) times daily as needed. For nerves   ASPIRIN EC 81 MG TABLET    Take 81 mg by mouth daily.   CLOPIDOGREL (PLAVIX) 75 MG TABLET    Take 1 tablet (75 mg total) by mouth daily with breakfast.   DONEPEZIL (ARICEPT) 5 MG TABLET    Take 5 mg by  mouth 2 (two) times daily.   FEEDING SUPPLEMENT (ENSURE COMPLETE) LIQD    Take 237 mLs by mouth 2 (two) times daily between meals.   FUROSEMIDE (LASIX) 40 MG TABLET    Take 1 tablet (40 mg total) by mouth daily.   GABAPENTIN (NEURONTIN) 100 MG CAPSULE    Take 100 mg by mouth 3 (three) times daily.   LISINOPRIL (PRINIVIL,ZESTRIL) 5 MG TABLET    Take 1 tablet (5 mg total) by mouth daily.   MULTIPLE VITAMIN (MULTIVITAMIN) TABLET    Take 1 tablet by mouth daily.     NITROGLYCERIN (NITROSTAT) 0.4 MG SL TABLET    Place 1 tablet (0.4 mg total) under the tongue every 5 (five) minutes x 3 doses as needed for chest pain.   OXYBUTYNIN (DITROPAN) 5 MG TABLET    Start with 1/2 tablet two times daily   PHOSPHORUS (K PHOS NEUTRAL) 155-852-130 MG TABLET    Take 250 mg by mouth 2 (two) times daily.   POLYETHYLENE GLYCOL (MIRALAX / GLYCOLAX) PACKET    Take 17 g by mouth daily.   POTASSIUM CHLORIDE SA (KLOR-CON M20) 20 MEQ TABLET    Take 20 mEq by mouth 2 (two) times daily.  Modified Medications   No medications on file  Discontinued Medications   No medications on file

## 2014-10-25 NOTE — Telephone Encounter (Signed)
Hope from the lab called with a critical potassium for the pt of 2.7.  This information was passed along to SN.  Per SN--  Pt will need to continue the kphos 1 po bid klor con 20 meq  1 po bid Will need to call [facility] care to make them aware new med to be added.  Called and spoke with Summer and gave this VO over the phone.  Will update the pts med list.

## 2014-10-25 NOTE — Patient Instructions (Signed)
Today we updated your med list in our EPIC system...    Continue your current medications the same...  We have asked the Rehab Facility to aide in transition home >>    Arrange for visiting nurses, home PT, home OT, etc...    Arrange for help w/ filling the med box...    Teach you her diet & the use of the "thick-it"  Today we rechecked your blood work 7 CXR...    We will contact you w/ the results when available...   Let's plan a follow up visit in 1 month.Marland KitchenMarland Kitchen

## 2014-10-26 NOTE — Telephone Encounter (Signed)
Called back to [facility] care and spoke with summer again about the pt.  She is aware to add the diamox 250 mg  1 po daily at 4pm.  Med list has been updated and summer stated that the pt as of now will be d/c on Friday unless any changes come up.  She stated that the pts daughter will get an updated list of the pts medications at the time of discharge.  i will call the pts daughter Friday to follow up on.

## 2014-10-26 NOTE — Addendum Note (Signed)
Addended by: Marcellus Scott on: 10/26/2014 03:58 PM   Modules accepted: Medications

## 2014-10-27 ENCOUNTER — Ambulatory Visit: Payer: Medicare Other | Admitting: Pulmonary Disease

## 2014-10-31 ENCOUNTER — Other Ambulatory Visit: Payer: Self-pay | Admitting: Cardiology

## 2014-10-31 ENCOUNTER — Telehealth: Payer: Self-pay | Admitting: Pulmonary Disease

## 2014-10-31 NOTE — Telephone Encounter (Signed)
Rx refill sent to patient pharmacy   

## 2014-10-31 NOTE — Telephone Encounter (Signed)
LMTCB x 1 

## 2014-11-02 NOTE — Telephone Encounter (Signed)
Per SN: Ok to to proceed with Jessica [name]'s request of visit 3 times per week for 1 week, then 2 times per week for 3 weeks, then 1 time a week for 2 weeks.   Called and spoke to [place]. Informed Jessica [name] that SN wants to continue with order. Jessica [name] verbalized understanding and denied any further questions or concerns at this time.

## 2014-11-02 NOTE — Telephone Encounter (Signed)
Called spoke with Marylene Land w/ [name] who would like order to resume care for nursing 3x/week the first week; 2x/week for 3 weeks and 1x/week for 2 weeks. Also, Marylene Land is not sure if PT and OT are coming back in or not and would like okay for these services as well in case they would like to resume care.  Dr Kriste Basque please advise, thank you!

## 2014-11-03 ENCOUNTER — Telehealth: Payer: Self-pay | Admitting: Pulmonary Disease

## 2014-11-03 NOTE — Telephone Encounter (Signed)
Called and spoke with tina and she stated that the pt is wanting to talk with SN at her next OV about her needs at home  Bedside commode Manual wheelchair [name] was going out today and they will access the pt for this equipment.    Pt also said that she wanted to speak with SN at her next visit about her shaking all the time and this hinders her from reading.  Inetta Fermo is aware that we will discuss with pt at her next OV

## 2014-11-03 NOTE — Telephone Encounter (Signed)
[id number] x1 

## 2014-11-03 NOTE — Telephone Encounter (Signed)
Return call.Stanley A Dalton °

## 2014-11-03 NOTE — Telephone Encounter (Signed)
Called and spoke with Byrd Hesselbach and gave the VO to continue care with the pt.  Nothing further is needed.

## 2014-11-15 ENCOUNTER — Telehealth: Payer: Self-pay | Admitting: Pulmonary Disease

## 2014-11-15 NOTE — Telephone Encounter (Signed)
Called and spoke with carol with [facility] and she is aware of VO to recert for home care.

## 2014-11-21 ENCOUNTER — Ambulatory Visit (INDEPENDENT_AMBULATORY_CARE_PROVIDER_SITE_OTHER): Payer: Medicare Other | Admitting: Podiatry

## 2014-11-21 DIAGNOSIS — M79673 Pain in unspecified foot: Secondary | ICD-10-CM

## 2014-11-21 DIAGNOSIS — B351 Tinea unguium: Secondary | ICD-10-CM

## 2014-11-22 NOTE — Progress Notes (Signed)
Subjective:     Patient ID: Jessica [name], female   DOB: 21-Nov-1911, 80 y.o.   MRN: [id number]  HPI  patient presents with nail disease and pain 1-5 both feet that she cannot cut   Review of Systems      Objective:   Physical Exam  Neurovascular status unchanged with thick yellow brittle nailbeds 1-5 of both feet    Assessment:     Mycotic nail infection with pain 1-5 both feet    Plan:     Debride painful nailbeds 1-5 both feet with no bleeding noted

## 2014-11-25 ENCOUNTER — Telehealth: Payer: Self-pay | Admitting: Pulmonary Disease

## 2014-11-25 NOTE — Telephone Encounter (Signed)
Called and spoke with debbie from [place].  She stated that they recertified with the pt again today.  Needed an order for the miralax since the pt is having a hard time going to the bathroom.  i advised debbie that she has this miralax on her med list and she will discuss this with the pt.  She also has the lasix 40 mg daily on her med list.  Eunice Blase stated that the pt was having some lower leg swelling.  She will cont to follow the pt with this and pt is scheduled to see SN on Monday.  Eunice Blase will set her up with a cna to come in 2 times per week to help the pt with bathing since the pt did not feel safe getting in and out of the shower alone.  Will follow up with the pt on Monday at her appt.

## 2014-11-28 ENCOUNTER — Ambulatory Visit (INDEPENDENT_AMBULATORY_CARE_PROVIDER_SITE_OTHER): Payer: Medicare Other | Admitting: Pulmonary Disease

## 2014-11-28 VITALS — BP 116/70 | HR 56 | Temp 98.3°F

## 2014-11-28 DIAGNOSIS — I5042 Chronic combined systolic (congestive) and diastolic (congestive) heart failure: Secondary | ICD-10-CM

## 2014-11-28 DIAGNOSIS — R609 Edema, unspecified: Secondary | ICD-10-CM

## 2014-11-28 DIAGNOSIS — I10 Essential (primary) hypertension: Secondary | ICD-10-CM

## 2014-11-28 DIAGNOSIS — I872 Venous insufficiency (chronic) (peripheral): Secondary | ICD-10-CM

## 2014-11-28 MED ORDER — ACETAZOLAMIDE 250 MG PO TABS: 250.0000 mg | ORAL_TABLET | Freq: Every day | ORAL | Status: DC

## 2014-11-28 MED ORDER — POTASSIUM CHLORIDE CRYS ER 20 MEQ PO TBCR: 20.0000 meq | EXTENDED_RELEASE_TABLET | Freq: Two times a day (BID) | ORAL | Status: DC

## 2014-11-28 MED ORDER — FUROSEMIDE 40 MG PO TABS: 40.0000 mg | ORAL_TABLET | Freq: Every day | ORAL | Status: DC

## 2014-11-28 NOTE — Patient Instructions (Signed)
Today we updated your med list in our EPIC system...    Continue your current medications the same...  We updated your med list & gave you several copies:    Give one copy to your son who helps w/ your meds...    Give one copy to the [facility] nurse who should check your meds at every visit...    Put one copy on the refrigerator for all to see & for handy reference...  Call for any questions...  Let's plan a follow up visit in 1 month.Marland KitchenMarland Kitchen

## 2014-11-29 ENCOUNTER — Encounter: Payer: Self-pay | Admitting: Pulmonary Disease

## 2014-11-29 NOTE — Progress Notes (Signed)
Subjective:    Patient ID: Jessica [name], female    DOB: Aug 19, 1911, 80 y.o.   MRN: [id number]  HPI 80 y/o BF here for a follow up visit...  She has mult medical problems including:  AR & runny nose;  Asthmatic Bronchitis;  HBP;  Chronic VI & edema;  Hyperchol;  GERD & esoph stricture;  Divertics;  Hx Urinary incontinence & UTIs;  DJD/ Osteoporosis;  Semile Dementia;  Anxiety...   ~  SEE PREV EPIC NOTES FOR EARLIER DATA >>  ~  March 23, 2013:  2-8moROV & JNakayais c/o ankle swelling which tends to go down overnight- we discussed stopping her Amlodipine & redoubling efforts at no salt, elevation, support hose, & Demadex;  also c/o fine rash ?generalized w/ pruritis & we discussed Rx w/ Lotrisone cream & Atarax 272mprn... She is once again asking for yet another round of home PT therapy...  We reviewed the following medical problems during today's office visit >>     HBP> on ASA81, Amlod2.5, Lisin5, Demadex20-1/2, Diamox250, & K10-3/d + KPhos-1Bid; BP=100/62 & she is feeling sl weak w/ mult somatic complaints; labs 4/14 show K=3.7, Phos=3.4, Cr=1.1; we decided to STOP the Amlodipine & work on sodium restriction.    VI, edema> she knows to elim sodium, elev legs, wear support hose, take Demadex 2094m...    Chol> on diet alone; she has a very hi HDL ~150!!!    Hypercalcemia> assoc w/ low phos- c/w primary hyperparathyroidism- she is 80y/o & not felt to be an operative candidate; she was [place]os [place] [facility] [place] w/ improvement in serum Phos to 3.6; serum Ca = 11.2 now but PTH is not rising.    GI- GERD, Divertics> off prev Prilosec20, Miralax, Senakot-S; advised these all as needed...    GU> saw DrTannenbaum for Urology 10/12- mult complaints, urodynamics done w/ sm hypersens bladder unstable w/ leakage, & large bladder divertic> he rec double voiding technique...    DJD, Osteopenia> off Ca supplements they sent her home from hosp w/ Phos-2Bid...    Senile Dementia> on Aricept10  & Alpraz0.25 as needed... We reviewed prob list, meds, xrays and labs> see below for updates >>   LABS 4/14:  Chems- Ca=11.2 but PTH is sl lower than prev at 86;  Phos=3.4 on KPhos1tabBid; K= 3.7 on this & K10- 2AM+1PM;  LFTs= wnl and Alb= 4.0 on Ensure...  ~  May 20, 2013:  29mo229mo & add-on for swelling> JuliKarmansists w/ c/o swelling in her legs; ?if she recalls our prev conversation about salt, elevation, support hose; she is taking the Demadex20mg85m& Diamox250mg 49malong w/ her KCl & KPhos=> labs today show: K=3.6, BUN=17, Cr=1.0;  Exam shows 3+edema in feet & lower legs, chest clear, heart w/ Gr1/6 SEM w/o rubs or gallops apprec;  We reviewed the need to Elim salt, Elevate the legs, Wear support hose, & we decided to STOP Lisinopril (BP=110/68) and INCREASE the Demadex20 to 2tabsQam...  ROV in 4-6weeks recheck; hopefully she can keep her visiting nurses, home help, etc (she will not go willingly to a NH or AL facility)...     We reviewed prob list, meds, xrays and labs> see below for updates >>   ~  June 23, 2013:  65mo RO228moJulia iAkeshae w/ her daugh & her husb [name] (Laveda Abbeseen 12/11 & asked to f/u in 28mo);  23mo OV we increased her Demadex to 20-2Qam + her Diamox250/d  along w/ her KCl & KPhos- she is improved having lost 3# down to 110# & less edema; BP is improved as well at 120/62;  Her CC today is some burning discomfort in the bottom of her feet esp when standing &walking; we discussed Neuropathy symptoms & she requests med rx trial- try Gabapentin 100->335m/d...  We reviewed prob list, meds, xrays and labs> see below for updates >>   Labs 7/14:  Chems- wnl x Ca=10.8, continue same meds...  ~  August 24, 2013:  25moOV & Charon says she was Dx w/ Scabies yest by [facility], we do not have notes; she says she got it from her husb who picked it up while in rehab; her CC is itching & hopefully the Kwell lotion will take care of all this; she states that her neuropathy sx are better on the  Gabapentin100Tid; She will turn [age]/o next month...  BP remains well controlled on diuretics alone & measures 116/64 today... We reviewed prob list, meds, xrays and labs> see below for updates >>  OK 2014 FLU vaccine today...   ~  October 25, 2013:  80m48moV & JulDeziahs turned 80y/o & she framed her letter from [place]; states she's doing fair- same old problems, pain in left shoulder & she wants more PT, but I offered Ortho eval for XRay & poss injection instead...     BP is controlled on ASA81, Demadex20-2Qam, Diamox250, & K10-3/d + KPhos-1Bid (off prev Amlod & Lisin); BP=122/70 & she denies angina pain, palpit, ch in SOB etc; K is sl low at 3.2 7 rec to incr KCl10 to 2Bid...    Serum Calcium id ok at 10.3- no progressive incr & we continue to monitor this parameter...    DJD is treated w/ OTC analgesics and rec to f/u w/ Ortho re: shoulder pain...    She has a senile dementia- on Aricept10 and Alpraz0.25 for prn use... We reviewed prob list, meds, xrays and labs> see below for updates >>   LABS 11/14:  Chems- ok x K=3.2, BUN=24, Cr=1.1;  CBC w/ Hg=11.8;  TSH=2.48...  ~  March 15, 2014:  11mo43mo & Ratasha has the same chr complaints- generally stable, notes leaking bladder (prev eval DrTannenbaum), burning feet (saw Podiatry-DrRegal), occas indigestion... She notes that she will be [facility] she wants ENT referral to check her ears for lavage...     She notes that her breathing is fine- no recent resp exac, cough, sputum, ch in SOB, etc...    BP= 106/64 on Demadex20Qam & Diamox250Qpm w/ K10-2Bid and KPhos1Bid; see labs below...    She ambulates w/ cane & walker, denies CP/ palpit/ ch in edema...     She notes some urinary incont & wears depends, no burning etc...    She remains on Aricept5Bid- she prefers it this way & won't change... We reviewed prob list, meds, xrays and labs> see below for updates >> she did not bring med list or med bottles & reminded  to bring bottles to every OV...   LABS 1/15:  Chems- ok w/ K=3.5, Cr=1.2  ~  May 20, 2014:  80mo 56mo& Courtnay is stable, same chr complaints which she like to discuss Q80mo..95mo  HBP> on ASA81, Demadex20-2Qam, Diamox250-at 4pm, & K10-2Bid + KPhos-1Bid; BP=100/60 & labs 6/15 show K=4.0, Phos=2.5, Cr=1.1; continue same meds for now...    VI, edema> she knows to  elim sodium, elev legs, wear support hose, take Demadex & Diamox as directed...    Chol> on diet alone; she has a very hi HDL ~150 when measured in 2012...    Hypercalcemia> assoc w/ low phos- c/w primary hyperparathyroidism- she is 80y/o & not felt to be an operative candidate; we are following Ca, PO4, PTH levels...     GI- GERD, Divertics> off prev Prilosec20, Miralax, Senakot-S; advised these all as needed...    GU> saw DrTannenbaum for Urology 10/12- mult complaints, urodynamics done w/ sm hypersens bladder unstable w/ leakage, & large bladder divertic> he rec double voiding technique...    DJD, Osteopenia> off Ca supplements, on KPhos-2Bid, on MVI & encouraged to walk as much as poss...    Senile Dementia> on Aricept10 & Alpraz0.25 as needed... We reviewed prob list, meds, xrays and labs> see below for updates >>   LABS 6/15:  Chems- ok x Ca=11.2, PO4=2.5, PTH=108 c/w primary hyperpara;  CBC- Hg=12.3;  TSH=2.09...   ~  July 20, 2014:  28moROV & JSkylynnremains stable overall- states that she has been doing fairly well but has a list of questions and wants me to re-order visiting nurses to check on her & husb every week or so (I explained that we will [facility] & see what she is eligible for under Medicare)...     1) she wants a nasal spray for drippy nose> try ASTELIN 1-2 sp in each nostril Bid as needed...    2) she is c/o burning discomfort in legs & we reviewed her Neuropathy Dx & the reason for the Gabapentin Rx- she is only taking 1037mBid & gets some benefit from this she says; offered to incr dose but  she can't remember to take it Tid 7 dosen't want to incr to 2Bid so we decided to keep it the same for now...    3) she is c/o urinary symptoms- leaking, some incont, denies burning/ odor/ pain/ blood etc; we discussed checking UA & C&S; we reviewed prev evals from DrNesi & DrTannenbaum- offered ROV w/ these providers but she declined; finally agreed to try Ditropan5m39m1/2tab bid for her symptoms and let me know if she is better...    4) she wants f/u labs and is due for BMet & Phos level (f/u hyperpara on observation)...  We reviewed prob list, meds, xrays and labs> see below for updates >> she brought med bottles and we reviewed her Rx today...  LABS 8/15:  Chems- ok x K=3.1 and Cr=1.2;  Phos=2.9;  UA= clear...   ~  September 20, 2014:  31mo25mo & post hosp visit> JuliKerina Adm by [facility] 9/14 - 09/02/14 w/ SOB, she denied CP, EKG was unchanged but Troponins were elev, D-dimer was also abn & a CTAngioChest was done- neg for PE, and VenDopplers were neg for DVT; she was treated w/ Heparin transiently;  2DEcho showed decr LVF w/ EF=45-50% w/ areas of HK, & Gr1DD w/ mild AS/AI- felt to have prob anteroseptal MI w/ distal LAD lesion likely but she refused invasive eval & was made a NCB/ DNR/ med rx only;  Meds were adjusted w/ Hep=> Plavix added, BBlocker continued, and ACE added; [facility] & [facility]...  In the rehab facility she was managed by the SeniAbingdonir notes are reviewed...  She was disch home 10/2 w/ home healt PT/ OT/ CNA/  Nursing/ and Education officer, museum all arranged...  After disch she returned to the ER 10/3 w/ paresthesias and weakness> seen by ER staff & Neuro consultant; BP was recorded at 180-190 sys, CT Brain & MRI were neg for acute infarct & she was felt to have had a TIA- already on Plavix & w/ nothing further to offer she was sent home...  She comes in today w/ daugh & appears weaker, BP is low at 92/64, marked increase in leg edema since  off her diuretics (stopped during recent hosp w/ her NSTEMI), going downhill but ever stoic she says she is doing satis at home w/ BSC, walker, etc; they truly love the home health care assist w/ Pt/ OT/ HHN/ aides/ & social worker; they again decline to consider NHP for pt & her husband...  We reviewed the following medical problems during today's office visit >>     HBP> prev on Demadex20-2Qam, Diamox250-at 4pm, & K10-2Bid + KPhos-1Bid; but now off all diuretics on MetopER25-1/2 daily, Lisinopril5 & KPhos-1Bid; BP=92/64 & no wiggle room to add back diuretics=> she will f/u w/ Cards soon...     ASHD, s/p NSTEMI, ischemic cardiomyopathy, combined sys & diast CHF> Adm 9/15 by [facility] w/ NSTEMI & combined sys/diast CHF; she declined intervention; medical therapy adjusted- off diuretics, added MetopER12.5/ Lisin5/ ASA81/ Plavix75, but edema incr.    VI, edema> off diuretics now per Cards adjustment 9/15; she knows to elim sodium, elev legs, wear support hose; she has f/u appt w/ Cards soon...    Chol> on diet alone; she has a very hi HDL ~150 when measured in 2012...    Hypercalcemia> assoc w/ low phos- c/w primary hyperparathyroidism- she is 80y/o & not felt to be an operative candidate; we are following Ca, PO4, PTH levels...     GI- GERD, Divertics> off prev Prilosec20, Miralax, Senakot-S; advised these all as needed...    GU> saw DrTannenbaum for Urology 10/12- mult complaints, urodynamics done w/ sm hypersens bladder unstable w/ leakage, & large bladder divertic> he rec double voiding technique & Ditropan5-1/2Bid...    ?TIA, Neuropathy> on Neurontin100Tid; she went to the ER 10/15 one day after disch from the NH w/ numbness=> NEG CTBrain & MRI w/o acute changes, symptoms passed & sent home on same ASA81 + Plavix75/d...    DJD, Osteopenia> off Ca supplements, on KPhos-1Bid, on MVI & encouraged to walk as much as poss...    Senile Dementia> on Aricept5 & Alpraz0.25 as needed... We reviewed prob list, meds,  xrays and labs> see below for updates >>   LABS 10/15:  FLP- ok on diet alone w/ HDL=159!  Chems- ok w/ Cr=1.01, K=4.1, BS=104, A1c=6.0, Ca=10.8;  CBC- Hg=11.7;  TSH=2.48...  CXR 9/15 showed mild cardiomeg, clear lungs, poor inspiration, osteopenia, scoliosis w/o change, NAD.Marland KitchenMarland Kitchen  EKG 9/15 showed SBrady, rate47, PACs, RBBB, left axis deviation, NSSTTWA...   2DEcho 9/15 showed mild LVH, reduced LVF w/ EF=45-50% w/ HK in apex & septum, Gr1DD, mild AS/AI, mildly thickened MV leaflets & mild LAdil, PAsys=40...  CT Angio Chest 9/15 showed neg for PE, mildly enlarged cardiac chambers, Ao looked ok, COPD/E bilat w/ atx vs scarring at bases, scoliosis & DDD, ?nonobstructing stone in mid right kidney...  CT Head 10/15 showed mild diffuse atrophy, no acute changes...  MRI Head 10/15 revealed no infarct, hemorrhage, or mass; mild atrophy & sm vessel dis, NAD...  PLAN>> she has gained 19# since last here w/ marked edema (4+bilat in legs) since she's been off  her diuretics; however BP is low at 92/64 on her low dose Metop & Lisin per Cards; advised no salt/ elev legs/ support hose/ & f/u w/ Cards ASAP as planned (not many options here- perhaps stop BBlocker, add diuretic, consider pacer);  She is happy w/ all the home health support...  ~  October 06, 2014:  2wk ROV & post-hosp visit> after last OV she was hosp by [facility] from 10/9 - 09/29/14 w/ incr SOB (O2sat=84% on RA), edema (BNP=9280, CXR w/ pulm edema), and weakness; but no CP or other symptoms and troponins were neg;  She was diuresed and her meds were adjusted (Metoprolol stopped due to low heart rate); disch to NHP for rehab and she is loving the attention "they are working with me"- on Lasix40 & KPhos bid, plus her ASA/ Plavix/ Lisin5...  Her CC is constipation> going every 3-4 days & firm, no blood reported; she is rec to start a regimen w/ Miralax daily & Senakot-S 2Qhs... We discussed rechecking her CXR (improved aeration) and LABS (K=3.1,  HCO3=42, Cr=1.1, BNP=485) today... REC to continue Lasix40, Add back the Diamox250/d & the KCl- 76mq caps 2/d as before... ROV in 2-3 weeks...     We reviewed prob list, meds, xrays and labs> see below for updates >>   CXR 10/15 Hosp showed cardiomeg, engorged pulm vasculature, increased markings, can't r/o effusions, all c/w pulm edema...   CXR 10/15 office f/u showed cardiomeg, improved aeration, persist bilat effusions...  LABS 10/15 office f/u>  Chems- K=3.1, HCO3=42, BUN=16, Cr=1.1, Ca=10.4, BNP=485...   ~  October 25, 2014:  3wk ROV & JKaylinis still in [facility] [facility]& to be released home in 3d her daughter says; [facility] home health support w/ visiting nurses, PT/ OT, etc; Phys therapy has checked her & the home situation & they feel she is safe for DC home; Daughter needs schooling on pt's home care needs, feeding, use of "thick-it", and wants to know "how to keep the pill box filled"...  [facility] f/u [facility]/22 w/ PA-Luke & they felt she was doing OK, no changes made, f/u 324mo I note that the Diamox & KCl we tried to add after her last OV here is not on her list- ?what happened? Since she was here last she has lost another 5# but still has 2+edema in LEs, breathing is OK, she is avoiding salt she says;    HBP, ASHD, s/pNSTEMI, ischemic cardiomyop (EF=45-50% w/ HK), combined sys&diast CHF, etc> on ASA81, Plavix75, Lisin5, Lasix40, KPhos2/d; Prev ordered Diamox250 & K10 are not on her list; BP= 122/64 & she denies ch in SOB, edema (2+), etc;  Labs w/ K=2.7, HCO3=40, Cr=1.1, BNP=311;  CXR w/ sm bilat effusions persist;  Plan is to add the Diamox250 one daily at 4PM, & KCl2060mid, continue other meds the same...     On Miralax, Senakot, Ensure> not taking PPI now; Hx GERD, Divertics; needs thick-it for her fluids per speech path & daughter must be taught...    On Ditropan5-1/2Bid for bladder...    On Neurontin100Tid for neuropathy...    On Aricept5 for  memory & Alprazolam 0.25 prn nerves...  We reviewed prob list, meds, xrays and labs> see below for updates >> we reconciled med lists   CXR 11/15 showed small bilat effusions and rounded area ?fluid in fissure) on left lat chest...  LABS 11/15:  Chems- K=2.7, HCO3=40, Cr=1.1;  BNP=311... PLAN>>  Continue meds, we  started KCl 43mq Bid and added DIAMOX2533mone tab at 4PM daily... ROV in 3 wks.  ~  November 28, 2014:  62m462moV & recheck> despite all efforts- conversation w/ daughter who brings pt to office/ son who lives w/ pt/ [facility] from [facility] still not taking the prescribed meds properly, [place]'t [place] Lasix, Diamox, or the KCl!...   I discussed this w/ the daughter but she doesn't understand & called her brother, I talked to him on the phone and he doesn't know the meds or have interest in providing this service for his mother;  Finally I called [facility] [phone] to [place] to increase services to MrsSolomon to whatever level is needed to provide the needed support 7 total supervision of her meds etc...    CV meds> ASA81, Plavix75, Lisinopril5, Lasix40Qam, Diamox250Qpm, KCl20Bid, KPhos-1Bid    Neuro meds> Aricept5Bid, Neurontin100Tid, Xanax0.25Tid prn    Others> Ditropan5-1/2Bid, Miralax daily, MVI, Ensure         Problem List:  Hx of ALLERGIES (ICD-995.3) - she uses OTC meds as needed, & we prev discussed Zyrtek 81m34m, Saline nasal mist, & Atrovent Nasal 0.03% Tid for drainage (one of her chronic complaints)... ~  8/15: we tries Astelin nasal spray for her chr complaints of dripping...  Hx of ASTHMATIC BRONCHITIS, ACUTE (ICD-466.0) - no recent exacerbations... ~  CXR 5/13 showed borderline heart size, kyphoscoliosis, no edema, and clear lungs... ~  CXR 10/13 showed normal heart size, clear lungs, kyphoscoliosis, osteopenia... ~  CXR 10/15> Hosp w/ CHF (cardiomeg, bilat effusions and interstitial  edema); she was diuresed and disch to NHP/rehab=> f/u CXR 10/15 w/ improved aeration, persist bilat effusions... ~  CXR 11/15 showed small bilat effusions and rounded area ?fluid in fissure) on left lat chest.  HYPERTENSION (ICD-401.9) >>  ~  controlled on ASA 862mg68mNORVASC5mg-153mab, LISINOPRIL5 & DEMADEX 20mg/d66mIAMOX250mg/d.38m  4/12:  BP= 110/60 and she states that she is taking her meds regularly & tolerating them well... denies HA, visual changes, CP, palpit, change in dyspnea, etc... but notes weakness, intermittently dizzy & persist edema in her feet...  ~  6/12:  BP= 124/70 & she remains stable... ~  8/12:  BP= 110/ 66 & stable... ~  10/12:  BP= 114/62 & stable... ~  1/13:  BP= 110/64 & she remains stable... ~  4/13:  BP= 116/58 & she is reminded to adjust Lasix 1-2 Qam according to her edema... ~  5/13:  BP= 140/72 & she is c/o pedal edema; we decided to change to Demadex 20mg- 2Q73m~  7/13:  BP= 110/60 & her edema is diminished, still 1+ in feet... Labs showed HCO3=39 & DIAMOX 250mg/d ad87m ~  8/13:  BP= 118/74 & edema is the same; ?what she's taking? & we decided to simplify regimen> Norvasc1/2, Lisin1/2, Demadex-1/d & Diamox-1/d... ~  9/13:  BP= 110/70 & she is clinically stable on these meds; add K10Bid for the K=3.4 today... ~  EKG 10/13 showed NSR, rate63, RBBB, poss old infarct anterolaterally... ~  11/13:  on ASA81, Amlod2.5, Lisin5, Demadex20-1/2, Diamox250, & K20/d; BP=122/64 & she is feeling better, still w/ mult somatic complaints... ~  12/13:  BP= 124/72 & her edema, weight, etc are about stable... ~  1/14:  BP= 118/60 & she persists w/ mult somatic complaints... ~  4/14:  on ASA81, Amlod2.5, Lisin5, Demadex20-1/2, Diamox250, & K10-3/d + KPhos-1Bid; BP=100/62 & she is feeling  sl weak w/ mult somatic complaints; labs 4/14 show K=3.7, Phos=3.4, Cr=1.1; we decided to STOP the Amlodipine & work on sodium restriction ~  6/14: on ASA81, Lisiin5, Demadex20, Diamox250, &  K10-3/d + KPhos-1Bid; BP= 110/68 & 3+edema persists; Labs- OK & we decided to STOP the Lisinopril & INCR the Demadex20-2Qam.... ~  7/14: on  ASA81, Demadex20-2/d, Diamox250, & K10-3/d + KPhos-1Bid; BP= 120/62 & she is improved w/ decr edema... ~  9/14:  BP remains well controlled on diuretics alone & measures 116/64 today.. ~  11/14: BP is controlled on ASA81, Demadex20-2Qam, Diamox250, & K10-3/d + KPhos-1Bid (off prev Amlod & Lisin); BP=122/70 & she denies angina pain, palpit, ch in SOB etc; K is sl low at 3.2 7 rec to incr KCl10 to 2Bid. ~  3/15: BP= 106/64 on Demadex20Qam & Diamox250Qpm w/ K10-2Bid and KPhos1Bid... ~  6/15:  on ASA81, Demadex20-2Qam, Diamox250-at 4pm, & K10-2Bid + KPhos-1Bid; BP=100/60 & labs 6/15 show K=4.0, Phos=2.5, Cr=1.1; continue same meds for now. ~  8/15: on ASA81, Demadex20-2Qam, Diamox250 at 4pm & K10-2/d;  BP= 120/62, continue same... ~  9/15: she was Hosp by [facility] w/ MI, cardiomyopathy w/ combined sys & diast CHF, & she refused invasive procedures/ cath etc; disch to [facility], then back home on ASA81, Plavix75, MetopER12.5, Lisin5, KPhos-Bid, & off all diuretics...  ~  2DEcho 9/15 showed mild LVH, reduced LVF w/ EF=45-50% w/ HK in apex & septum, Gr1DD, mild AS/AI, mildly thickened MV leaflets & mild LAdil, PAsys=40... ~  10/15: ret to office weaker w/ 19# wt gain & 4+edema off her diuretics w/ BP= 92/64, wt up 19# w/ 4+edema=> she was re-hosp w/ pulm edema, BNP=9280, given IV Lasix & med adjustment- disch on ASA81, Plavix75, Lasix40, Lisin5, KPos-Bid=> now w/ decr edema & BP=124/62 but f/u labs showed persist bilat pleural effusions, K=3.1, HCO3=42, Cr=1.1, BNP=485... REC to continue current meds, add Diamox250 at 4PM daily, add KCl-87mq caps 2/d but this wasn't donne by the NH... ~  11/15: She has lost another 5# & breathing is stable> CXR w/ persistent sm bilat effusions & ?fluid in fissure; Labs showed K=2.7, HCO3=40, Cr=1.1, & BNP=311; we decided to add the  Diamox250 & K20Bid  VENOUS INSUFFICIENCY, CHRONIC (ICD-459.81) - there is mild pedal edema bilat L>R... on low sodium diet, elevation, support hose- prev refused diuretic meds due to urinary symptoms, [facility]...  HYPERCHOLESTEROLEMIA (ICD-272.0) - prev on Lip10, but she stopped this on her own... she has a very high HDL!!! ~  FLP was 1/08 showed TChol 253, TG 125, HDL 121, LDL 93 ~  FLP 2/09 showed TChol 200, TG 99, HDL 136, LDL 44 ~  FLP 4/11 showed TChol 244, TG 143, HDL 149, LDL 59 ~  FLP 1/12 showed TChol 266, TG 102, HDL 151, LDL 83 >> BEST HDL I'VE EVER SEEN ~  It has been hard to get her back in for FASTING labs... ~  [facility] alone showed TChol 222, TG 61, HDL 159, LDL 51  HYPERCALCEMIA >> routine labs w/ Calcium levels betw 10.2 & 11.7 over the last 227yr.. ~  Labs 7/13 showed Ca= 11.8 &  PTH= 81... For now avoid calc supplements etc... ~  Labs 9/13 showed Ca= 11.0 & she is reminded- no calcium, no VitD... ~  Labs 10/13 Hosp reviewed> Ca was up, Phos was low, K was low> all improved w/ Rx in hosp (they did not repeat PTH level). ~  4/14:   assoc w/ low phos- c/w primary hyperparathyroidism- she is 80y/o & not felt to be an operative candidate; she was started on KPhos 10/13 in [place] on 2Bid w/ improvement in serum Phos to 3.6; serum Ca = 11.2 now but PTH is not rising.  ~  Labs 11/14 showed Ca=10.3 ~  Labs 1/15 showed Ca= 10.5 ~  6/15:  Labs showed  Ca=11.2, PO4=2.5, PTH=108 c/w primary hyperparathyroidism; we are following... ~  Labs 8/15 improved w/ Ca= 10.4, Phos= 2.9  GERD (ICD-530.81) - last EGD was 9/05 by DrPerry showing GERD and esoph stricture- dilated... she stopped her Nexium but states that her swallowing is good & she uses PRILOSEC OTC as needed.  DIVERTICULOSIS OF COLON (ICD-562.10) - last colonoscopy was 11/00 showing divertics only... ~  8/12:  She notes some constipation & rec to take [place] per our usual  protocol...  HX, URINARY INFECTION (ICD-V13.02) & INCONTINENCE symptoms... she has seen DrTannenbaum & staff on bladder exercises and she reported some improvement but has persistant symptoms & never followed up... ~  7/10:  offered f/u appt w/ Urology vs second opinion consult but she declines... ~  9/10:  wrote for trial Enablex 7.14m/d >> no benefit she says. ~  12/10: saw DrNesi- tried sample med, sl improved, but "he turned me over to a lady for longer term treatments" she says... ~  4/12 & 6/12: states DrNesi hasn't been able to help her & she wants appt w/ DrTannenbaum==> seen w/ urodynamic eval revealing a sm hypersens bladder & a large bladder divertic; not a surg candidate, taught double voiding technique... ~  10/12: she had f/u DrTannenbaum & he reinforced prev w/u & need for double voiding technique... ~  10/13: In hosp Urine grew mult species, Rx w/ Roceph=> Cipro...  DEGENERATIVE JOINT DISEASE (ICD-715.90) - notes right shoulder symptoms, but managing OK w/ Mobic 7.572m& Tylenol; also right hip & low back pain...  she also c/o neuropathic "burning" in legs but not bad enough for additional meds she says... ~  8/14: [place] pt c/o bilat foot & ankle pain & burning w/ numbness & tingling; Dx w/ somatic dysfunction in lumbar region; subluxations seen on XRays; given an adjustment... ~  9/14: symptoms improved on Gabapentin 10052mid she says...  OSTEOPOROSIS (ICD-733.00) - she was on Fosamax but had discontinued this on her own... we discussed the need for continued therapy and she was asked to restart Alendronate but she never did...  she takes Ca++, MVI, VitD==> rec to STOP the Calcium.  SENILE DEMENTIA (ICD-290.0) - she takes ASA 72m2m.. she tried Aricept in the past but didn't notice any improvement therefore stopped it. ~  she & her husb still live on their own but are having numerous difficulties while still resisting any  thoughts of assisted living etc> got concerned because she saw an impulse in her left antecubital fossa (tortuous brachial art), went to the ER & this got translated to palpitations & lead to full ER cardiac eval= neg... ~  6/11: MMSE showed 26/30 & she agreed to try DONEPEZIL 10mg33m. ~  6/12:  Still taking Aricept & appears stable... ~  7/13:  No change, same meds, clinically stable... ~  7/14:  She remains stable and will be 89 y29in Oct... ~  3/15:  She remains on Aricept5Bid- she prefers it this way & won't change...  ANXIETY (ICD-300.00) -  she uses Alpraz 0.25m as needed.  Hx of SHINGLES (ICD-053.9)   Past Surgical History  Procedure Laterality Date  . Vesicovaginal fistula closure w/ tah    . Tonsillectomy    . Abdominal hysterectomy      Outpatient Encounter Prescriptions as of 11/28/2014  Medication Sig  . acetaZOLAMIDE (DIAMOX) 250 MG tablet Take 1 tablet (250 mg total) by mouth daily. At 4 pm  . ALPRAZolam (XANAX) 0.25 MG tablet Take 0.125-0.25 mg by mouth 3 (three) times daily as needed. For nerves  . aspirin EC 81 MG tablet Take 81 mg by mouth daily.  . clopidogrel (PLAVIX) 75 MG tablet Take 1 tablet (75 mg total) by mouth daily with breakfast.  . donepezil (ARICEPT) 5 MG tablet Take 5 mg by mouth 2 (two) times daily.  . feeding supplement (ENSURE COMPLETE) LIQD Take 237 mLs by mouth 2 (two) times daily between meals.  . furosemide (LASIX) 40 MG tablet Take 1 tablet (40 mg total) by mouth daily.  .Marland Kitchengabapentin (NEURONTIN) 100 MG capsule Take 100 mg by mouth 3 (three) times daily.  .Marland Kitchenlisinopril (PRINIVIL,ZESTRIL) 5 MG tablet TAKE 1 TABLET (5 MG TOTAL) BY MOUTH DAILY.  . Multiple Vitamin (MULTIVITAMIN) tablet Take 1 tablet by mouth daily.    . nitroGLYCERIN (NITROSTAT) 0.4 MG SL tablet Place 1 tablet (0.4 mg total) under the tongue every 5 (five) minutes x 3 doses as needed for chest pain.  .Marland Kitchenoxybutynin (DITROPAN) 5 MG tablet Start with 1/2 tablet two times daily  .  phosphorus (K PHOS NEUTRAL) 155-852-130 MG tablet Take 250 mg by mouth 2 (two) times daily.  . polyethylene glycol (MIRALAX / GLYCOLAX) packet Take 17 g by mouth daily.  . potassium chloride SA (KLOR-CON M20) 20 MEQ tablet Take 1 tablet (20 mEq total) by mouth 2 (two) times daily.  . [DISCONTINUED] acetaZOLAMIDE (DIAMOX) 250 MG tablet Take 250 mg by mouth daily. At 4 pm  . [DISCONTINUED] furosemide (LASIX) 40 MG tablet Take 1 tablet (40 mg total) by mouth daily.  . [DISCONTINUED] potassium chloride SA (KLOR-CON M20) 20 MEQ tablet Take 20 mEq by mouth 2 (two) times daily.    No Known Allergies   Current Medications, Allergies, Past Medical History, Past Surgical History, Family History, and Social History were reviewed in [employer] [facility].    Review of Systems        See HPI - all other systems neg except as noted...      The patient complains of decreased hearing, dyspnea on exertion, peripheral edema, incontinence, muscle weakness, and difficulty walking.  The patient denies anorexia, fever, weight loss, weight gain, vision loss, hoarseness, chest pain, syncope, prolonged cough, headaches, hemoptysis, abdominal pain, melena, hematochezia, severe indigestion/heartburn, hematuria, suspicious skin lesions, transient blindness, depression, unusual weight change, abnormal bleeding, enlarged lymph nodes, and angioedema.     Objective:   Physical Exam      WD, WN, 80 y/o BF in NAD... GENERAL:  Alert, pleasant & cooperative; she is slow moving as noted... HEENT:  [place]/AT, EOM-full, EACs- some wax, TMs-wnl, NOSE-clear, THROAT-clear & wnl. NECK:  Supple w/ fairROM; no JVD; normal carotid impulses w/o bruits; no thyromegaly or nodules palpated; no lymphadenopathy. CHEST:  Clear to P & A; without wheezes/ rales/ or rhonchi., noted kyphosis... HEART:  Regular Rhythm; Gr1/6 SEM, without rubs or gallops heard... ABDOMEN:  Soft & nontender; normal bowel sounds; no organomegaly  or masses detected. EXT:  mod arthritic changes; no varicose veins/ +venous  insuffic/ 3+edema now deficits... DERM:  No lesions noted; no rash etc...  MMSE> 06/13/10> Score 26/30 (missed place, 2/3 recall after distraction, & copy figure)...   Assessment & Plan:    ASHD, s/p NSTEMI, combined sys/diast CHF, VI, edema>   [facility] she declined invasive options, treated medically, but going downhill w/ hypotension and marked incr edema, she will f/u w/ Cards regarding options avail...  =>  MEDS:  ASA81, Plavix75, Lisinopril5, Lasix40Qam, Diamox250Qpm, KCl20Bid, KPhos-1Bid 10/15> we tried to add Diamox250 & K10 but neither were started as requested in the NH... 11/15> she is now home again, we have ordered Diamox250 & K20Bid to be added to her meds=> in follow up it was apparent that she never filled these Rxs. 12/15> Pt is not capable of doing her own meds, son who lives w/ pt & daughter who brings her to OVs are similarly incapable of providing this supervision, I have talked w/ Jackelyn Poling at [place].  AR & ASTHMA>  Breathing is stable, at baseline, she has chr rhinorrhea complaints w/ numerous discussions regarding management of this problem....  Ven Insuffic/ Edema>>  We have had mult conversations at each OV regarding no salt, elevation, support hose; she is now off the Demadex, Diamox w/ marked incr in edema=> has f/u w/ Cards soon to see if meds can be adjusted further & diuretic restarted.  CHOL>  She has the world's best HDL!!!  Electrolye Abn>  See 10/13 Hosp by [facility], it is apparent that her elev calcium is the result of HYPERPARATHYROIDISM but at age 29 we are reluctant to refer for surg unless progressive... We are following her serum Calcium level & non-progressive. Hypercalcemia>  Surely from Hyperpara & she has stopped the calcium supplements, & we are following the labs...  GU>  She saw DrTannenbaum w/ extensive investigation into her voiding symptoms & urodynamics  as above; rec to use "double voiding" technique... 8/15> still complaining & declines ret to Urology; try Ditropan 56m- 1/2 tab Bid...  DJD/ Osteopenia>  Aware, she is stable on OTC meds, she has repeatedly refuse Bisphos meds for her bones...  TIA, Dementia>  On ASA81, Plavix75; She & husb CLaveda Abbeare still living independently w/ daugh help but they are very fragile yet won't consider NHP...  Other medical problems as noted...   Patient's Medications  New Prescriptions   No medications on file  Previous Medications   ALPRAZOLAM (XANAX) 0.25 MG TABLET    Take 0.125-0.25 mg by mouth 3 (three) times daily as needed. For nerves   ASPIRIN EC 81 MG TABLET    Take 81 mg by mouth daily.   CLOPIDOGREL (PLAVIX) 75 MG TABLET    Take 1 tablet (75 mg total) by mouth daily with breakfast.   DONEPEZIL (ARICEPT) 5 MG TABLET    Take 5 mg by mouth 2 (two) times daily.   FEEDING SUPPLEMENT (ENSURE COMPLETE) LIQD    Take 237 mLs by mouth 2 (two) times daily between meals.   GABAPENTIN (NEURONTIN) 100 MG CAPSULE    Take 100 mg by mouth 3 (three) times daily.   LISINOPRIL (PRINIVIL,ZESTRIL) 5 MG TABLET    TAKE 1 TABLET (5 MG TOTAL) BY MOUTH DAILY.   MULTIPLE VITAMIN (MULTIVITAMIN) TABLET    Take 1 tablet by mouth daily.     NITROGLYCERIN (NITROSTAT) 0.4 MG SL TABLET    Place 1 tablet (0.4 mg total) under the tongue every 5 (five) minutes x 3 doses as needed for  chest pain.   OXYBUTYNIN (DITROPAN) 5 MG TABLET    Start with 1/2 tablet two times daily   PHOSPHORUS (K PHOS NEUTRAL) 155-852-130 MG TABLET    Take 250 mg by mouth 2 (two) times daily.   POLYETHYLENE GLYCOL (MIRALAX / GLYCOLAX) PACKET    Take 17 g by mouth daily.  Modified Medications   Modified Medication Previous Medication   ACETAZOLAMIDE (DIAMOX) 250 MG TABLET acetaZOLAMIDE (DIAMOX) 250 MG tablet      Take 1 tablet (250 mg total) by mouth daily. At 4 pm    Take 250 mg by mouth daily. At 4 pm   FUROSEMIDE (LASIX) 40 MG TABLET furosemide (LASIX) 40  MG tablet      Take 1 tablet (40 mg total) by mouth daily.    Take 1 tablet (40 mg total) by mouth daily.   POTASSIUM CHLORIDE SA (KLOR-CON M20) 20 MEQ TABLET potassium chloride SA (KLOR-CON M20) 20 MEQ tablet      Take 1 tablet (20 mEq total) by mouth 2 (two) times daily.    Take 20 mEq by mouth 2 (two) times daily.  Discontinued Medications   No medications on file

## 2014-11-30 ENCOUNTER — Telehealth: Payer: Self-pay | Admitting: Pulmonary Disease

## 2014-11-30 NOTE — Telephone Encounter (Signed)
Spoke with nurse at [facility], wants to know if pt is supposed to continue taking metoprolol 25mg  .5 tab daily.  Also requesting a med list to be faxed to her at [phone].  I do not see metoprolol on pt's med list.  Last seen 11/28/14.  Please advise.  Thanks!

## 2014-11-30 NOTE — Telephone Encounter (Signed)
Called and spoke with Okey Regal from [facility].  She stated that she wanted to get [place] to send her a copy of the pts current medication list.  She stated that she went out today to fix her medications for the week.  She was advised that the pt was seen yesterday and that we did give her 3 copies of the current medication list and was supposed to give 1 copy to [place], 1 to hang on her fridge and 1 to her son.  Okey Regal stated that she did not see any copies of her medication list.  This list and her avs has been faxed to [phone].  Nothing further is needed.

## 2014-11-30 NOTE — Telephone Encounter (Signed)
The pt and her daughter were given 3 med lists at her last OV of the medications that she is to be on.  The pt was to give one to the [facility] nurse and one to her son and hang the other one on her fridge at home so everyone that comes to do her medications will know what meds she is to be taking and how.  DID the pt not do this?  Or the pts daughter?

## 2014-12-01 ENCOUNTER — Telehealth: Payer: Self-pay | Admitting: Pulmonary Disease

## 2014-12-01 NOTE — Telephone Encounter (Signed)
Spoke with Jessica [name], she just wanted to make SN aware that pt's pulse has been about 48 this morning.  She has not been exerting herself.  BP was 126/68, having 17 respirations per minute.  No chest pain, dizziness, shortness of breath.  Just fyi.  Please let us know if there is anything we need to do for this pt.  Thanks!

## 2014-12-02 NOTE — Telephone Encounter (Signed)
Jessica [name] calling this morning stating that nurse was to call her back to let her know what to do a/b this pt.Jessica [name]

## 2014-12-02 NOTE — Telephone Encounter (Signed)
Spoke with Okey Regal at [facility], she is just wanting to see if SN has any recommendations for this patient or just to monitor her at this time.  Her vitals are stable and the same as yesterday.  Please advise, Dr. Kriste Basque.    Thanks!

## 2014-12-02 NOTE — Telephone Encounter (Signed)
Spoke with Jessica [name], she is aware that per SN we are just to monitor her right now and to contact us if her symptoms worsen.  Nothing further needed.

## 2014-12-12 ENCOUNTER — Telehealth: Payer: Self-pay | Admitting: Pulmonary Disease

## 2014-12-12 NOTE — Telephone Encounter (Signed)
Called and spoke with  Okey Regal from [facility] and gave the VO to come in 2 x per week to help pt with bathing.  This has been done and nothing further is needed.

## 2014-12-14 ENCOUNTER — Telehealth: Payer: Self-pay | Admitting: Pulmonary Disease

## 2014-12-14 NOTE — Telephone Encounter (Signed)
Called and spoke with maria from [facility].  She was requesting to extend the pts PT meetings.  This has been approved and maria will call back for any other issues.

## 2014-12-14 NOTE — Telephone Encounter (Signed)
[phone] returning call [facility]

## 2014-12-14 NOTE — Telephone Encounter (Signed)
lmomtcb x 1 for maria from [facility]

## 2014-12-15 ENCOUNTER — Other Ambulatory Visit: Payer: Self-pay | Admitting: Pulmonary Disease

## 2014-12-15 ENCOUNTER — Other Ambulatory Visit: Payer: Self-pay | Admitting: Adult Health

## 2014-12-17 ENCOUNTER — Other Ambulatory Visit: Payer: Self-pay | Admitting: Pulmonary Disease

## 2014-12-19 ENCOUNTER — Telehealth: Payer: Self-pay | Admitting: Pulmonary Disease

## 2014-12-19 DIAGNOSIS — R0600 Dyspnea, unspecified: Secondary | ICD-10-CM

## 2014-12-19 DIAGNOSIS — F0390 Unspecified dementia without behavioral disturbance: Secondary | ICD-10-CM

## 2014-12-19 NOTE — Telephone Encounter (Signed)
Order placed with [facility] to see if they will be able to help her.

## 2014-12-19 NOTE — Telephone Encounter (Signed)
Called and spoke to [place] with [facility]. Okey Regal stated pt got new insurance and [name] is unable to bill through pt's new insurance and will need a new home health agency.  Dr. Kriste Basque please advise a new HH agency.

## 2014-12-20 ENCOUNTER — Other Ambulatory Visit: Payer: Self-pay | Admitting: Adult Health

## 2014-12-21 ENCOUNTER — Telehealth: Payer: Self-pay | Admitting: Pulmonary Disease

## 2014-12-21 NOTE — Telephone Encounter (Signed)
Spoke to pt's son. Pt's son stated he is unable to get pt's medications filled at pharmacy d/t pt's insurance member ID not matching her name and DOB. Informed pt's son that he will need to call pt's insurance company back and let them know pt's insurance has not changed and that pt has been filling her meds with this same insurance for months. Pt's son stated he will call them back and speak with the supervisor. Pt's son aware to call back if needed. Nothing further needed at this time.

## 2014-12-26 ENCOUNTER — Other Ambulatory Visit: Payer: Self-pay | Admitting: Adult Health

## 2014-12-29 ENCOUNTER — Encounter: Payer: Self-pay | Admitting: Pulmonary Disease

## 2014-12-29 ENCOUNTER — Other Ambulatory Visit (INDEPENDENT_AMBULATORY_CARE_PROVIDER_SITE_OTHER): Payer: 59

## 2014-12-29 ENCOUNTER — Ambulatory Visit (INDEPENDENT_AMBULATORY_CARE_PROVIDER_SITE_OTHER): Payer: Medicare Other | Admitting: Pulmonary Disease

## 2014-12-29 VITALS — BP 100/52 | HR 60 | Temp 97.7°F | Ht 60.0 in | Wt 114.1 lb

## 2014-12-29 DIAGNOSIS — I5042 Chronic combined systolic (congestive) and diastolic (congestive) heart failure: Secondary | ICD-10-CM

## 2014-12-29 DIAGNOSIS — N183 Chronic kidney disease, stage 3 unspecified: Secondary | ICD-10-CM

## 2014-12-29 DIAGNOSIS — I4510 Unspecified right bundle-branch block: Secondary | ICD-10-CM

## 2014-12-29 DIAGNOSIS — I10 Essential (primary) hypertension: Secondary | ICD-10-CM

## 2014-12-29 DIAGNOSIS — F0390 Unspecified dementia without behavioral disturbance: Secondary | ICD-10-CM

## 2014-12-29 DIAGNOSIS — R531 Weakness: Secondary | ICD-10-CM

## 2014-12-29 DIAGNOSIS — I2510 Atherosclerotic heart disease of native coronary artery without angina pectoris: Secondary | ICD-10-CM

## 2014-12-29 DIAGNOSIS — R609 Edema, unspecified: Secondary | ICD-10-CM

## 2014-12-29 DIAGNOSIS — I872 Venous insufficiency (chronic) (peripheral): Secondary | ICD-10-CM

## 2014-12-29 LAB — BASIC METABOLIC PANEL
BUN: 15 mg/dL (ref 6–23)
CALCIUM: 10.4 mg/dL (ref 8.4–10.5)
CO2: 29 mEq/L (ref 19–32)
CREATININE: 1.14 mg/dL (ref 0.40–1.20)
Chloride: 107 mEq/L (ref 96–112)
GFR: 56.23 mL/min — AB (ref >60.00)
GLUCOSE: 100 mg/dL — AB (ref 70–99)
POTASSIUM: 3.9 meq/L (ref 3.5–5.1)
SODIUM: 141 meq/L (ref 135–145)

## 2014-12-29 LAB — PHOSPHORUS: Phosphorus: 2.9 mg/dL (ref 2.3–4.6)

## 2014-12-29 NOTE — Patient Instructions (Signed)
Today we updated your med list in our EPIC system...    Continue your current medications the same...  Good job with keeping up with your medications...  Remember>> NO SALT or sodium in your diet (see hand out)...  Today we did your follow up blood work...    We will call you tomorrow about these results & see if we can increase your diuretics...  Call for any questions...  Let's plan a follow up visit in 62Shirlee LatRosemarSwazilaMethShirlee BD782Dutch QuiWirelessRelationsOphthal17mShirlee LatRosemarSwazilaLakeShirlee La696NorthEast RockD812Dutch QuiWireless28mShirlee LatRosemarSwazilaMile SquShirlee La696BeaMiddD725Dutch QuiWirelessRelationsAmbulatory Surgery Center At Virtua [facility] Dba Virtu93mShirlee LatRosemarSwazilaMedical Plaza Ambulatory SurgeryShirleeBlD772Dutch QuiWirelessRelationsCrescent Cit31mShirlee LatRosemarSwazilaSouShirlee LaFaD223Dutch QuiWirelessRelationsCollier Endoscop50mShirlee LatRosemarSwazilaCovenant MeShirlee La69WesD724Dutch QuiWirelessRelations24mShirlee LatRosemarSwazila2Shirlee LSunlitD250Dutch QuiWirelessRelationsFar69mShirlee LatRosemarSwazilaStonewall JShirlee La6D(463Dutch QuiWirelessRelationsThe Ent Center56mShirlee LatRosemarSwazilaSt. AnthoShirlee LaAppD650Dutch QuiWirelessRelationsMpi Chemical Dependen83mShirlee LatRosemarSwazilaCache ValShirlee La6SlipperD331Dutch QuiWirelessRelationsSurgicenter Of Murfree38mShirlee LatRosemarSwazilaLakeview Specialty HosShirlee LKenD(939)Dutch QuiWirelessRelationsKent Coun25mShirlee LatRosemarSwazilShirlee La696NorbournVassar CD4Dutch QuiWirelessRelationsDigestive Dis72mShirlee LatRosemarSwazilaLone Star EndoShirlee La696AntGrand D2Dutch QuiWirelessRelationsWinifred Masterson Burke Reh34mShirlee LatRosemarSwazilaCatskill Regional Medical Center GroShirlee La696TiWincD5Dutch QuiWirelessRelationsWestern Ma7mShirlee LatRosemarSwazilaEfthShirlee LGD(585Dutch QuiWirelessRelationsSt Mary Reh64mShirlee LatRosemarSwazilaRivers EdgShirlee La696BellerBD703Dutch QuiWirelessRelatio62mShirlee LatRosemarSwazilaSutter-Yuba PsychiaShirlee La6MeadD4Dutch QuiWirelessRelations66mShirlee LatRosemarSwazilaPulaShirlee La69PenD330Dutch QuiWirelessRelationsSt Josephs Outpatien26mShirlee LatRosemarSwazilaHarborShirlee La69HannD571Dutch QuiWirelessRelationsChildren'S HospitRosemarSwazilaNoShirlee La6MD607Dutch QuiWirelessRelat80mShirlee LatRosemarSwazilaEisenhowShirlee La696ChoSt.D(484Dutch QuiWirelessRelationsNortheast Alabam66mShirlee LatRosemarSwazilaCShirlee La69BD(289)Dutch QuiWirelessRelationsWhittier Reh96mShirlee LatRosemarSwazilaHealthsouth Deaconess RShirlee La69AD226Dutch QuiWirelessRelationsLan3mShirlee LatRosemarSwazilaNorth HavShirlee La6D508Dutch QuiWirelessRelationsEmanuel53mShirlee LatRosemarSwazilaStShirlee LaJemez SD(775)Dutch QuiWirelessRelationsSurgical Eye Experts LLC Dba Surgical Exper58mShirlee LatRosemarSwaziShirlee La696GreenD(613Dutch QuiWirelessRelationsMedical Plaza Ambulatory Surgery 56mShirlee LatRosemarSwazilaCataract And Laser Surgery CentShirlee LaFultD878Dutch QuiWirelessRelationsSaint [facility] A19mShirlee LatRosemarSwaziShirlee La696UStD930Dutch QuiWirelessRelationsEncompass Health Rehabilitation H71mShirlee LatRosemarSwazilaGreenbrierShirlee La696LaD9Dutch QuiWirelessRelationsAlvarado Hos63mShirlee LatRosemarSwShirlee La69East WashD715Dutch QuiWirelessRelation71mShirlee LatRosemarSwazilaShirlee LaLaD608Dutch QuiWirelessRelationsThe Endoscop45mShirlee LatRosemarSwazilaCapitoShirlee La696GoBricesD913Dutch QuiWirelessRelationsKindred Ho59mShirlee LatRosemarSwazilaKaiShirlee La6MannD(214Dutch QuiWirelessRelationsAll53mShirlee LatRosemarSwazilaDoheny EShirlee La6NatchiD213Dutch QuiWirelessRelationsLeo N. Levi Nationa14mShirlee LatRosemarSwazilaBeaShirlee La696D(219Dutch QuiWirelessRelationsPalms Wes54mShirlee LatRosemarSwazilaPresance Chicago [facility] Network Dba Presence HolShirlee LaD(605Dutch QuiWirelessRelationsChar66mShirlee LatRosemarSwShirlee La696CoD773Dutch QuiWirelessRelation41mShirlee LatRosemarSwazShirlee La696BenNoD980Dutch QuiWirelessRelationsSurgery 89mShirlee LatRosemarSwazilaOur Lady OShirlee LaD540Dutch QuiWirelessRelatio63mShirlee LatRosemarSwazilaMayo Clinic HealtShirlee La696PiD843Dutch QuiWirelessRelationsC8mShirlee LatRosemarSwazilaEncompShirlee La696SAbboD551Dutch QuiWirelessRelationsPerr78mShirlee LatRosemarSwazilaCukroShirlee La6GD(281Dutch QuiWirelessRelationsAurora 69mShirlee LatRosemarSwazilaSt MShirlee La69D(680Dutch QuiWirelessRelationsTurks Hea9mShirlee LatRosemarSwazShirlee La6OweD(586Dutch QuiWirelessRelationsSuncoast Specialty26mShirlee LatRosemarSwazilaNorth CenShirlee La69CD(807Dutch QuiWirelessRelationsGlendale Endo58mShirlee LatRosemarSwazilaAroostook Medical Center - CommunShirlee La696TaIslandD(512Dutch QuiWirelessRelations64mShirlee LatRosemarSwazilaNovamed SurShirlee La696StanMD(360Dutch QuiWirelessRelationsDige65mShirlee LatRosemarSwazilaPikShirlee La696BRoD314Dutch QuiWirelessRelationsEaton RapidsRobbie LisrrnetGary 

## 2014-12-29 NOTE — Progress Notes (Signed)
Subjective:    Patient ID: Jessica Jessica [name], female    DOB: August 22, 1911, 81 y.o.   MRN: [id number]  HPI 81 y/o BF here for a follow up visit...  She has mult medical problems including:  AR & runny nose;  Asthmatic Bronchitis;  HBP;  Chronic VI & edema;  Hyperchol;  GERD & esoph stricture;  Divertics;  Hx Urinary incontinence & UTIs;  DJD/ Osteoporosis;  Semile Dementia;  Anxiety...   ~  SEE PREV EPIC NOTES FOR EARLIER DATA >>  ~  March 23, 2013:  2-527moROV & Jessica Jessica [name] c/o ankle swelling which tends to Jessica [name] down overnight- we discussed stopping her Amlodipine & redoubling efforts at no salt, elevation, support hose, & Demadex;  also c/o fine rash ?generalized w/ pruritis & we discussed Rx w/ Lotrisone cream & Atarax 231mprn... She is once again asking for yet another round of home PT therapy...  We reviewed the following medical problems during today's office visit >>     HBP> on ASA81, Amlod2.5, Lisin5, Demadex20-1/2, Diamox250, & K10-3/d + KPhos-1Bid; BP=100/62 & she is feeling sl weak w/ mult somatic complaints; labs 4/14 show K=3.7, Phos=3.4, Cr=1.1; we decided to STOP the Amlodipine & work on sodium restriction.    VI, edema> she knows to elim sodium, elev legs, wear support hose, take Demadex 2022m...    Chol> on diet alone; she has a very hi HDL ~150!!!    Hypercalcemia> assoc w/ low phos- c/w primary hyperparathyroidism- she is 81y/o & not felt to be an operative candidate; she was started on KPhos 10/13 [facility] w/ improvement in serum Phos to 3.6; serum Ca = 11.2 now but PTH is not rising.    GI- GERD, Divertics> off prev Prilosec20, Miralax, Senakot-S; advised these all as needed...    GU> saw Jessica Jessica [name] for Urology 10/12- mult complaints, urodynamics done w/ sm hypersens bladder unstable w/ leakage, & large bladder divertic> he rec double voiding technique...    DJD, Osteopenia> off Ca supplements they sent her home from hosp w/ Phos-2Bid...    Senile Dementia> on Aricept10  & Alpraz0.25 as needed... We reviewed prob list, meds, xrays and labs> see below for updates >>   LABS 4/14:  Chems- Ca=11.2 but PTH is sl lower than prev at 86;  Phos=3.4 on KPhos1tabBid; K= 3.7 on this & K10- 2AM+1PM;  LFTs= wnl and Alb= 4.0 on Ensure...  ~  May 20, 2013:  48mo87mo & add-on for swelling> [name] w/ c/o swelling in her legs; ?if she recalls our prev conversation about salt, elevation, support hose; she is taking the Demadex20mg53m& Diamox250mg 75malong w/ her KCl & KPhos=> labs today show: K=3.6, BUN=17, Cr=1.0;  Exam shows 3+edema in feet & lower legs, chest clear, heart w/ Gr1/6 SEM w/o rubs or gallops apprec;  We reviewed the need to Elim salt, Elevate the legs, Wear support hose, & we decided to STOP Lisinopril (BP=110/68) and INCREASE the Demadex20 to 2tabsQam...  ROV in 4-6weeks recheck; hopefully she can keep her visiting nurses, home help, etc (she will not Jessica [name] willingly to a NH or AL facility)...     We reviewed prob list, meds, xrays and labs> see below for updates >>   ~  June 23, 2013:  27mo RO24moJulia [name] w/ her daugh & her husb Jessica Jessica [name] (Jessica Jessica [name] 12/11 & asked to f/u in 18mo);  80mo OV we increased her Demadex to 20-2Qam + her Diamox250/d  along w/ her KCl & KPhos- she is improved having lost 3# down to 110# & less edema; BP is improved as well at 120/62;  Her CC today is some burning discomfort in the bottom of her feet esp when standing &walking; we discussed Neuropathy symptoms & she requests med rx trial- try Gabapentin 100->3228m/d...  We reviewed prob list, meds, xrays and labs> see below for updates >>   Labs 7/14:  Chems- wnl x Ca=10.8, continue same meds...  ~  August 24, 2013:  240moOV & Jessica Jessica [name] says she was Dx w/ Scabies yest by Jessica Jessica Jessica [name] [facility], we do not have notes; she says she got it from her husb who picked it up while in rehab; her CC is itching & hopefully the Kwell lotion will take care of all this; she states that her neuropathy sx are better on the  Gabapentin100Tid; She will turn [age]/o next month...  BP remains well controlled on diuretics alone & measures 116/64 today... We reviewed prob list, meds, xrays and labs> see below for updates >>  OK 2014 FLU vaccine today...   ~  October 25, 2013:  28m78moV & Jessica Jessica [name] turned 81y/o & she framed her letter from Jessica [name]; states she's doing fair- same old problems, pain in left shoulder & she wants more PT, but I offered Ortho eval for XRay & poss injection instead...     BP is controlled on ASA81, Demadex20-2Qam, Diamox250, & K10-3/d + KPhos-1Bid (off prev Amlod & Lisin); BP=122/70 & she denies angina pain, palpit, ch in SOB etc; K is sl low at 3.2 7 rec to incr KCl10 to 2Bid...    Serum Calcium id ok at 10.3- no progressive incr & we continue to monitor this parameter...    DJD is treated w/ OTC analgesics and rec to f/u w/ Ortho re: shoulder pain...    She has a senile dementia- on Aricept10 and Alpraz0.25 for prn use... We reviewed prob list, meds, xrays and labs> see below for updates >>   LABS 11/14:  Chems- ok x K=3.2, BUN=24, Cr=1.1;  CBC w/ Hg=11.8;  TSH=2.48...  ~  March 15, 2014:  30mo45mo & Jessica Jessica [name] has the same chr complaints- generally stable, notes leaking bladder (prev eval Jessica Jessica [name]), burning feet (saw Podiatry-Jessica [name]), occas indigestion... She notes that she will be getting [facility] she wants ENT referral to check her ears for lavage...     She notes that her breathing is fine- no recent resp exac, cough, sputum, ch in SOB, etc...    BP= 106/64 on Demadex20Qam & Diamox250Qpm w/ K10-2Bid and KPhos1Bid; see labs below...    She ambulates w/ cane & walker, denies CP/ palpit/ ch in edema...     She notes some urinary incont & wears depends, no burning etc...    She remains on Aricept5Bid- she prefers it this way & won't change... We reviewed prob list, meds, xrays and labs> see below for updates >> she did not bring med list or med bottles & reminded  to bring bottles to every OV...   LABS 1/15:  Chems- ok w/ K=3.5, Cr=1.2  ~  May 20, 2014:  28mo 130mo& [name] is stable, same chr complaints which she like to discuss Q28mo..230mo  HBP> on ASA81, Demadex20-2Qam, Diamox250-at 4pm, & K10-2Bid + KPhos-1Bid; BP=100/60 & labs 6/15 show K=4.0, Phos=2.5, Cr=1.1; continue same meds for now...    VI, edema> she knows to  elim sodium, elev legs, wear support hose, take Demadex & Diamox as directed...    Chol> on diet alone; she has a very hi HDL ~150 when measured in 2012...    Hypercalcemia> assoc w/ low phos- c/w primary hyperparathyroidism- she is 81y/o & not felt to be an operative candidate; we are following Ca, PO4, PTH levels...     GI- GERD, Divertics> off prev Prilosec20, Miralax, Senakot-S; advised these all as needed...    GU> saw Jessica Jessica [name] for Urology 10/12- mult complaints, urodynamics done w/ sm hypersens bladder unstable w/ leakage, & large bladder divertic> he rec double voiding technique...    DJD, Osteopenia> off Ca supplements, on KPhos-2Bid, on MVI & encouraged to walk as much as poss...    Senile Dementia> on Aricept10 & Alpraz0.25 as needed... We reviewed prob list, meds, xrays and labs> see below for updates >>   LABS 6/15:  Chems- ok x Ca=11.2, PO4=2.5, PTH=108 c/w primary hyperpara;  CBC- Hg=12.3;  TSH=2.09...   ~  July 20, 2014:  31moROV & JHayaremains stable overall- states that she has been doing fairly well but has a list of questions and wants me to re-order visiting nurses to check on her & husb every week or so (I explained that we will [name] & see what she is eligible for under Medicare)...     1) she wants a nasal spray for drippy nose> try ASTELIN 1-2 sp in each nostril Bid as needed...    2) she is c/o burning discomfort in legs & we reviewed her Neuropathy Dx & the reason for the Gabapentin Rx- she is only taking 1029mBid & gets some benefit from this she says; offered to incr dose but  she can't remember to take it Tid 7 dosen't want to incr to 2Bid so we decided to keep it the same for now...    3) she is c/o urinary symptoms- leaking, some incont, denies burning/ odor/ pain/ blood etc; we discussed checking UA & C&S; we reviewed prev evals from DrNesi & Jessica Jessica [name]- offered ROV w/ these providers but she declined; finally agreed to try Ditropan5m50m1/2tab bid for her symptoms and let me know if she is better...    4) she wants f/u labs and is due for BMet & Phos level (f/u hyperpara on observation)...  We reviewed prob list, meds, xrays and labs> see below for updates >> she brought med bottles and we reviewed her Rx today...  LABS 8/15:  Chems- ok x K=3.1 and Cr=1.2;  Phos=2.9;  UA= clear...   ~  September 20, 2014:  61mo69mo & post hosp visit> [facility] by [facility] 9/14 - 09/02/14 w/ SOB, she denied CP, EKG was unchanged but Troponins were elev, D-dimer was also abn & a CTAngioChest was done- neg for PE, and VenDopplers were neg for DVT; she was treated w/ Heparin transiently;  2DEcho showed decr LVF w/ EF=45-50% w/ areas of HK, & Gr1DD w/ mild AS/AI- felt to have prob anteroseptal MI w/ distal LAD lesion likely but she refused invasive eval & was made a NCB/ DNR/ med rx only;  Meds were adjusted w/ Hep=> Plavix added, BBlocker continued, and ACE added; [facility]...  In the rehab facility she was managed by the Seni[facility]ir notes are reviewed...  She was disch home 10/2 w/ home healt PT/ OT/ CNA/  Nursing/ and Education officer, museum all arranged...  After disch she returned to the ER 10/3 w/ paresthesias and weakness> seen by ER staff & Neuro consultant; BP was recorded at 180-190 sys, CT Brain & MRI were neg for acute infarct & she was felt to have had a TIA- already on Plavix & w/ nothing further to offer she was sent home...  She comes in today w/ daugh & appears weaker, BP is low at 92/64, marked increase in leg edema since  off her diuretics (stopped during recent hosp w/ her NSTEMI), going downhill but ever stoic she says she is doing satis at home w/ BSC, walker, etc; they truly love the home health care assist w/ Pt/ OT/ HHN/ aides/ & social worker; they again decline to consider NHP for pt & her husband...  We reviewed the following medical problems during today's office visit >>     HBP> prev on Demadex20-2Qam, Diamox250-at 4pm, & K10-2Bid + KPhos-1Bid; but now off all diuretics on MetopER25-1/2 daily, Lisinopril5 & KPhos-1Bid; BP=92/64 & no wiggle room to add back diuretics=> she will f/u w/ Cards soon...     ASHD, s/p NSTEMI, ischemic cardiomyopathy, combined sys & diast CHF> Adm 9/15 by Cards w/ NSTEMI & combined sys/diast CHF; she declined intervention; medical therapy adjusted- off diuretics, added MetopER12.5/ Lisin5/ ASA81/ Plavix75, but edema incr.    VI, edema> off diuretics now per Cards adjustment 9/15; she knows to elim sodium, elev legs, wear support hose; she has f/u appt w/ Cards soon...    Chol> on diet alone; she has a very hi HDL ~150 when measured in 2012...    Hypercalcemia> assoc w/ low phos- c/w primary hyperparathyroidism- she is 81y/o & not felt to be an operative candidate; we are following Ca, PO4, PTH levels...     GI- GERD, Divertics> off prev Prilosec20, Miralax, Senakot-S; advised these all as needed...    GU> saw Jessica Jessica [name] for Urology 10/12- mult complaints, urodynamics done w/ sm hypersens bladder unstable w/ leakage, & large bladder divertic> he rec double voiding technique & Ditropan5-1/2Bid...    ?TIA, Neuropathy> on Neurontin100Tid; she went to the ER 10/15 one day after disch from the NH w/ numbness=> NEG CTBrain & MRI w/o acute changes, symptoms passed & sent home on same ASA81 + Plavix75/d...    DJD, Osteopenia> off Ca supplements, on KPhos-1Bid, on MVI & encouraged to walk as much as poss...    Senile Dementia> on Aricept5 & Alpraz0.25 as needed... We reviewed prob list, meds,  xrays and labs> see below for updates >>   LABS 10/15:  FLP- ok on diet alone w/ HDL=159!  Chems- ok w/ Cr=1.01, K=4.1, BS=104, A1c=6.0, Ca=10.8;  CBC- Hg=11.7;  TSH=2.48...  CXR 9/15 showed mild cardiomeg, clear lungs, poor inspiration, osteopenia, scoliosis w/o change, NAD.Marland KitchenMarland Kitchen  EKG 9/15 showed SBrady, rate47, PACs, RBBB, left axis deviation, NSSTTWA...   2DEcho 9/15 showed mild LVH, reduced LVF w/ EF=45-50% w/ HK in apex & septum, Gr1DD, mild AS/AI, mildly thickened MV leaflets & mild LAdil, PAsys=40...  CT Angio Chest 9/15 showed neg for PE, mildly enlarged cardiac chambers, Ao looked ok, COPD/E bilat w/ atx vs scarring at bases, scoliosis & DDD, ?nonobstructing stone in mid right kidney...  CT Head 10/15 showed mild diffuse atrophy, no acute changes...  MRI Head 10/15 revealed no infarct, hemorrhage, or mass; mild atrophy & sm vessel dis, NAD...  PLAN>> she has gained 19# since last here w/ marked edema (4+bilat in legs) since she's been off  her diuretics; however BP is low at 92/64 on her low dose Metop & Lisin per Cards; advised no salt/ elev legs/ support hose/ & f/u w/ Cards ASAP as planned (not many options here- perhaps stop BBlocker, add diuretic, consider pacer);  She is happy w/ all the home health support...  ~  October 06, 2014:  2wk ROV & post-hosp visit> after last OV she was hosp by [facility] from 10/9 - 09/29/14 w/ incr SOB (O2sat=84% on RA), edema (BNP=9280, CXR w/ pulm edema), and weakness; but no CP or other symptoms and troponins were neg;  She was diuresed and her meds were adjusted (Metoprolol stopped due to low heart rate); disch to NHP for rehab and she is loving the attention "they are working with me"- on Lasix40 & KPhos bid, plus her ASA/ Plavix/ Lisin5...  Her CC is constipation> going every 3-4 days & firm, no blood reported; she is rec to start a regimen w/ Miralax daily & Senakot-S 2Qhs... We discussed rechecking her CXR (improved aeration) and LABS (K=3.1,  HCO3=42, Cr=1.1, BNP=485) today... REC to continue Lasix40, Add back the Diamox250/d & the KCl- 76mq caps 2/d as before... ROV in 2-3 weeks...     We reviewed prob list, meds, xrays and labs> see below for updates >>   CXR 10/15 Hosp showed cardiomeg, engorged pulm vasculature, increased markings, can't r/o effusions, all c/w pulm edema...   CXR 10/15 office f/u showed cardiomeg, improved aeration, persist bilat effusions...  LABS 10/15 office f/u>  Chems- K=3.1, HCO3=42, BUN=16, Cr=1.1, Ca=10.4, BNP=485...   ~  October 25, 2014:  3wk ROV & JKaylinis still in [employer] to be released home in 3d her daughter says; [name] home health support w/ visiting nurses, PT/ OT, etc; Phys therapy has checked her & the home situation & they feel she is safe for DC home; Daughter needs schooling on pt's home care needs, feeding, use of "thick-it", and wants to know "how to keep the pill box filled"...  [facility] w/ PA-Luke & they felt she was doing OK, no changes made, f/u 324mo I note that the Diamox & KCl we tried to add after her last OV here is not on her list- ?what happened? Since she was here last she has lost another 5# but still has 2+edema in LEs, breathing is OK, she is avoiding salt she says;    HBP, ASHD, s/pNSTEMI, ischemic cardiomyop (EF=45-50% w/ HK), combined sys&diast CHF, etc> on ASA81, Plavix75, Lisin5, Lasix40, KPhos2/d; Prev ordered Diamox250 & K10 are not on her list; BP= 122/64 & she denies ch in SOB, edema (2+), etc;  Labs w/ K=2.7, HCO3=40, Cr=1.1, BNP=311;  CXR w/ sm bilat effusions persist;  Plan is to add the Diamox250 one daily at 4PM, & KCl2060mid, continue other meds the same...     On Miralax, Senakot, Ensure> not taking PPI now; Hx GERD, Divertics; needs thick-it for her fluids per speech path & daughter must be taught...    On Ditropan5-1/2Bid for bladder...    On Neurontin100Tid for neuropathy...    On Aricept5 for  memory & Alprazolam 0.25 prn nerves...  We reviewed prob list, meds, xrays and labs> see below for updates >> we reconciled med lists   CXR 11/15 showed small bilat effusions and rounded area ?fluid in fissure) on left lat chest...  LABS 11/15:  Chems- K=2.7, HCO3=40, Cr=1.1;  BNP=311... PLAN>>  Continue meds, we  started KCl 49mEq Bid and added DIAMOX250mg  one tab at 4PM daily... ROV in 3 wks.  ~  November 28, 2014:  31mo ROV & recheck> despite all efforts- conversation w/ daughter who brings pt to office/ son who lives w/ pt/ visiting nurses from [place] is still not taking the prescribed meds properly, Pharm ([facility]) confirms that she hasn't filled Lasix, Diamox, or the KCl!...   I discussed this w/ the daughter but she doesn't understand & called her brother, I talked to him on the phone and he doesn't know the meds or have interest in providing this service for his mother;  Finally I called Jessica Jessica [name] [phone] & talked to [place] Jessica [name] & asked them to increase services to MrsSolomon to whatever level is needed to provide the needed support 7 total supervision of her meds etc...    CV meds> ASA81, Plavix75, Lisinopril5, Lasix40Qam, Diamox250Qpm, KCl20Bid, KPhos-1Bid    Neuro meds> Aricept5Bid, Neurontin100Tid, Xanax0.25Tid prn    Others> Ditropan5-1/2Bid, Miralax daily, MVI, Ensure  ~  December 29, 2014:  55mo ROV & recheck> Jessica Jessica [name] is here w/ her son (first time I have met him) who does her meds; Jessica Jessica [name] has been coming out but apparently NOT supervising her meds (no notes or calls from them)- still done by son,placing pills in a weekly med box & checking to be sure she is taking it properly; they brought all her med bottles today- [facility] & prev filled 12/14 (date of last OV); unfortunately she has not lost any edema (still 3-4+ in both feet/ lower legs and her wt is the same as last month ~114#.  She feels the same- denies CP, palpit, SOB, dizzy, syncope,  etc; remains weak, in wheelchair at 81y/o, & VS are stable w/ BP=100/52, pulse= 60/min, O2sat=94% on RA; Chest has diminished BS at bases bilat but no wheezing, rales, rhonchi; Cardiac exam unchanged w/ Gr1/6 SEM no rubs or gallops apprec...     We reviewed prob list, meds, xrays and labs> see below for updates >>   LABS 1/16:  Chems- ok w/ K=3.9, BS=100, Cr=1.14, Po4=2.9.Marland KitchenMarland Kitchen  PLAN>> we are checking her metabolic labs to see if we can safely increase her Lasix dose to affect a better diuresis; in the meanwhile she is reminded to elim salt/ sodium for her diet, elev legs, wear support hose; given 2gm Na diet sheet; we plan ROV recheck in 25mo... => Chems OK therefore increase Lasix40mg - 2tabsQam.         Problem List:  Hx of ALLERGIES (ICD-995.3) - she uses OTC meds as needed, & we prev discussed Zyrtek 10mg Qam, Saline nasal mist, & Atrovent Nasal 0.03% Tid for drainage (one of her chronic complaints)... ~  8/15: we tries Astelin nasal spray for her chr complaints of dripping...  Hx of ASTHMATIC BRONCHITIS, ACUTE (ICD-466.0) - no recent exacerbations... ~  CXR 5/13 showed borderline heart size, kyphoscoliosis, no edema, and clear lungs... ~  CXR 10/13 showed normal heart size, clear lungs, kyphoscoliosis, osteopenia... ~  CXR 10/15> Hosp w/ CHF (cardiomeg, bilat effusions and interstitial edema); she was diuresed and disch to NHP/rehab=> f/u CXR 10/15 w/ improved aeration, persist bilat effusions... ~  CXR 11/15 showed small bilat effusions and rounded area ?fluid in fissure) on left lat chest.  HYPERTENSION (ICD-401.9) >>  ~  controlled on ASA 81mg /d, NORVASC5mg -1/2tab, LISINOPRIL5 & DEMADEX 20mg /d & DIAMOX250mg /d... ~  4/12:  BP= 110/60 and she states that she is taking her meds regularly &  tolerating them well... denies HA, visual changes, CP, palpit, change in dyspnea, etc... but notes weakness, intermittently dizzy & persist edema in her feet...  ~  6/12:  BP= 124/70 & she remains  stable... ~  8/12:  BP= 110/ 66 & stable... ~  10/12:  BP= 114/62 & stable... ~  1/13:  BP= 110/64 & she remains stable... ~  4/13:  BP= 116/58 & she is reminded to adjust Lasix 1-2 Qam according to her edema... ~  5/13:  BP= 140/72 & she is c/o pedal edema; we decided to change to Demadex $RemoveBe'20mg'AzLXLjBwc$ - 2Qam. ~  7/13:  BP= 110/60 & her edema is diminished, still 1+ in feet... Labs showed HCO3=39 & DIAMOX $Remove'250mg'JaJOFys$ /d added. ~  8/13:  BP= 118/74 & edema is the same; ?what she's taking? & we decided to simplify regimen> Norvasc1/2, Lisin1/2, Demadex-1/d & Diamox-1/d... ~  9/13:  BP= 110/70 & she is clinically stable on these meds; add K10Bid for the K=3.4 today... ~  EKG 10/13 showed NSR, rate63, RBBB, poss old infarct anterolaterally... ~  11/13:  on ASA81, Amlod2.5, Lisin5, Demadex20-1/2, Diamox250, & K20/d; BP=122/64 & she is feeling better, still w/ mult somatic complaints... ~  12/13:  BP= 124/72 & her edema, weight, etc are about stable... ~  1/14:  BP= 118/60 & she persists w/ mult somatic complaints... ~  4/14:  on ASA81, Amlod2.5, Lisin5, Demadex20-1/2, Diamox250, & K10-3/d + KPhos-1Bid; BP=100/62 & she is feeling sl weak w/ mult somatic complaints; labs 4/14 show K=3.7, Phos=3.4, Cr=1.1; we decided to STOP the Amlodipine & work on sodium restriction ~  6/14: on ASA81, Lisiin5, Demadex20, Diamox250, & K10-3/d + KPhos-1Bid; BP= 110/68 & 3+edema persists; Labs- OK & we decided to STOP the Lisinopril & INCR the Demadex20-2Qam.... ~  7/14: on  ASA81, Demadex20-2/d, Diamox250, & K10-3/d + KPhos-1Bid; BP= 120/62 & she is improved w/ decr edema... ~  9/14:  BP remains well controlled on diuretics alone & measures 116/64 today.. ~  11/14: BP is controlled on ASA81, Demadex20-2Qam, Diamox250, & K10-3/d + KPhos-1Bid (off prev Amlod & Lisin); BP=122/70 & she denies angina pain, palpit, ch in SOB etc; K is sl low at 3.2 7 rec to incr KCl10 to 2Bid. ~  3/15: BP= 106/64 on Demadex20Qam & Diamox250Qpm w/ K10-2Bid and  KPhos1Bid... ~  6/15:  on ASA81, Demadex20-2Qam, Diamox250-at 4pm, & K10-2Bid + KPhos-1Bid; BP=100/60 & labs 6/15 show K=4.0, Phos=2.5, Cr=1.1; continue same meds for now. ~  8/15: on ASA81, Demadex20-2Qam, Diamox250 at 4pm & K10-2/d;  BP= 120/62, continue same... ~  9/15: she was [facility] by [facility] (Adm 9/14 - 09/02/14))w/ MI, cardiomyopathy w/ combined sys & diast CHF, & she refused invasive procedures/ cath etc; disch to [facility] rehab, then back home on ASA81, Plavix75, MetopER12.5, Lisin5, KPhos-Bid, & off all diuretics...  ~  2DEcho 9/15 showed mild LVH, reduced LVF w/ EF=45-50% w/ HK in apex & septum, Gr1DD, mild AS/AI, mildly thickened MV leaflets & mild LAdil, PAsys=40... ~  10/15: ret to office weaker w/ 19# wt gain & 4+edema off her diuretics w/ BP= 92/64, wt up 19# w/ 4+edema=> she was re-hosp w/ pulm edema (Adm 10/9 - 09/29/14), BNP=9280, given IV Lasix & med adjustment- disch on ASA81, Plavix75, Lasix40, Lisin5, KPos-Bid=> now w/ decr edema & BP=124/62 but f/u labs showed persist bilat pleural effusions, K=3.1, HCO3=42, Cr=1.1, BNP=485... REC to continue current meds, add Diamox250 at 4PM daily, add KCl-63mEq caps 2/d but this wasn't done by the NH... ~  11/15: She has lost another 5# & breathing is stable> CXR w/ persistent sm bilat effusions & ?fluid in fissure; Labs showed K=2.7, HCO3=40, Cr=1.1, & BNP=311; we decided to add the Diamox250 & K20Bid ~  12/15: it is apparent that she is not taking any of her meds since disch from the NH; daugh is clueless & son (who does her meds), doesn't know what they are or what they are for; [facility], [facility]amox, others. We discussed having visiting nurse supervision of meds & I talked to Jessica Jessica [name]... ~  1/16: she is here w/ her son who is still doing her meds he says> they brought med bottles and [facility] taking Lisin5, Lasix40, Diamox250, KCl20Bid, KPhosBid; unfortunately her wt is the same at 114# & her edema remains 3-4+ in LEs; we  will recheck labs & see if we can incr the Lasix dose=> Labs ok therefore incr Lasix40-2Qam.  VENOUS INSUFFICIENCY, CHRONIC & EDEMA - on low sodium diet, elevation, support hose- prev refused diuretic meds due to urinary symptoms, then prev on [facility] which was stopped by Cards in hosp 9/15... subseq gained 20# w/ 4+ pedal edema & re-hosp 10/15 w/ IV lasix & disch on oral Lasix but it is apparent that she did not fill the Rx & wasn't taking her meds... ~  1/16:  She is back on Lisin5, Lasix40, Diamox250, KCl20Bid, KPhosBid=> but she has not lost any wt or any of her pedal edema=> K & PO4 are wnl, Cr=1.14 therefore incr Lsaix40 to 2Qam.  HYPERCHOLESTEROLEMIA (ICD-272.0) - prev on Lip10, but she stopped this on her own... she has a very high HDL!!! ~  FLP was 1/08 showed TChol 253, TG 125, HDL 121, LDL 93 ~  FLP 2/09 showed TChol 200, TG 99, HDL 136, LDL 44 ~  FLP 4/11 showed TChol 244, TG 143, HDL 149, LDL 59 ~  FLP 1/12 showed TChol 266, TG 102, HDL 151, LDL 83 >> BEST HDL I'VE EVER SEEN ~  It has been hard to get her back in for FASTING labs... ~  FLP 9/15 in [place] on diet alone showed TChol 222, TG 61, HDL 159, LDL 51  HYPERCALCEMIA >> routine labs w/ Calcium levels betw 10.2 & 11.7 over the last 67yrs... ~  Labs 7/13 showed Ca= 11.8 &  PTH= 81... For now avoid calc supplements etc... ~  Labs 9/13 showed Ca= 11.0 & she is reminded- no calcium, no VitD... ~  Labs 10/13 Hosp reviewed> Ca was up, Phos was low, K was low> all improved w/ Rx in hosp (they did not repeat PTH level). ~  4/14:   assoc w/ low phos- c/w primary hyperparathyroidism- she is 81y/o & not felt to be an operative candidate; she was started on KPhos 10/13 in [name] & disch on 2Bid w/ improvement in serum Phos to 3.6; serum Ca = 11.2 now but PTH is not rising.  ~  Labs 11/14 showed Ca=10.3 ~  Labs 1/15 showed Ca= 10.5 ~  6/15:  Labs showed  Ca=11.2, PO4=2.5, PTH=108 c/w primary hyperparathyroidism; we are following... ~  Labs 8/15  improved w/ Ca= 10.4, Phos= 2.9  GERD (ICD-530.81) - last EGD was 9/05 by DrPerry showing GERD and esoph stricture- dilated... she stopped her Nexium but states that her swallowing is good & she uses PRILOSEC OTC as needed.  DIVERTICULOSIS OF COLON (ICD-562.10) - last colonoscopy was 11/00 showing divertics only... ~  8/12:  She notes some constipation & rec to  take MIRALAX & SENAKOT-S per our usual protocol...  HX, URINARY INFECTION (ICD-V13.02) & INCONTINENCE symptoms... she has seen [name] & staff on bladder exercises and she reported some improvement but has persistant symptoms & never followed up... ~  7/10:  offered f/u appt w/ Urology vs second opinion consult but she declines... ~  9/10:  wrote for trial Enablex 7.$RemoveBeforeD'5mg'nMdnAIdtEMtPlx$ /d >> no benefit she says. ~  12/10: saw DrNesi- tried sample med, sl improved, but "he turned me over to a lady for longer term treatments" she says... ~  4/12 & 6/12: states DrNesi hasn't been able to help her & she wants appt w/ [name]==> seen w/ urodynamic eval revealing a sm hypersens bladder & a large bladder divertic; not a surg candidate, taught double voiding technique... ~  10/12: she had f/u [name] & he reinforced prev w/u & need for double voiding technique... ~  10/13: In hosp Urine grew mult species, Rx w/ Roceph=> Cipro...  DEGENERATIVE JOINT DISEASE (ICD-715.90) - notes right shoulder symptoms, but managing OK w/ Mobic 7.$RemoveBe'5mg'fKZbyNYjE$  & Tylenol; also right hip & low back pain...  she also c/o neuropathic "burning" in legs but not bad enough for additional meds she says... ~  8/14: we have a note from [place] (7 page note reviewed)> pt c/o bilat foot & ankle pain & burning w/ numbness & tingling; Dx w/ somatic dysfunction in lumbar region; subluxations seen on XRays; given an adjustment... ~  9/14: symptoms improved on Gabapentin $RemoveBefor'100mg'fKflZrrjRdjD$  Tid she says...  OSTEOPOROSIS (ICD-733.00) - she was on Fosamax but had discontinued this on her own... we  discussed the need for continued therapy and she was asked to restart Alendronate but she never did...  she takes Ca++, MVI, VitD==> rec to STOP the Calcium.  SENILE DEMENTIA (ICD-290.0) - she takes ASA $RemoveB'81mg'QYyrqVCH$ /d... she tried Aricept in the past but didn't notice any improvement therefore stopped it. ~  she & her husb still live on their own but are having numerous difficulties while still resisting any thoughts of assisted living etc> got concerned because she saw an impulse in her left antecubital fossa (tortuous brachial art), went to the ER & this got translated to palpitations & lead to full ER cardiac eval= neg... ~  6/11: MMSE showed 26/30 & she agreed to try DONEPEZIL $RemoveBefor'10mg'bHDjAXjnavsB$ /d... ~  6/12:  Still taking Aricept & appears stable... ~  7/13:  No change, same meds, clinically stable... ~  7/14:  She remains stable and will be 81 y/o in Oct... ~  3/15:  She remains on Aricept5Bid- she prefers it this way & won't change...  ANXIETY (ICD-300.00) - she uses Alpraz 0.$RemoveBefor'25mg'BsrBnpEzvfZK$  as needed.  Hx of SHINGLES (ICD-053.9)   Past Surgical History  Procedure Laterality Date  . Vesicovaginal fistula closure w/ tah    . Tonsillectomy    . Abdominal hysterectomy      Outpatient Encounter Prescriptions as of 12/29/2014  Medication Sig  . acetaZOLAMIDE (DIAMOX) 250 MG tablet Take 1 tablet (250 mg total) by mouth daily. At 4 pm  . aspirin EC 81 MG tablet Take 81 mg by mouth daily.  . clopidogrel (PLAVIX) 75 MG tablet TAKE 1 TABLET BY MOUTH EVERY DAY  . donepezil (ARICEPT) 5 MG tablet TAKE 1 TABLET (5 MG TOTAL) BY MOUTH 2 (TWO) TIMES DAILY.  . feeding supplement (ENSURE COMPLETE) LIQD Take 237 mLs by mouth 2 (two) times daily between meals.  . furosemide (LASIX) 40 MG tablet Take 1 tablet (40 mg total) by  mouth daily.  Marland Kitchen gabapentin (NEURONTIN) 100 MG capsule Take 100 mg by mouth 3 (three) times daily.  Marland Kitchen lisinopril (PRINIVIL,ZESTRIL) 5 MG tablet TAKE 1 TABLET (5 MG TOTAL) BY MOUTH DAILY.  . Multiple Vitamin  (MULTIVITAMIN) tablet Take 1 tablet by mouth daily.    . nitroGLYCERIN (NITROSTAT) 0.4 MG SL tablet Place 1 tablet (0.4 mg total) under the tongue every 5 (five) minutes x 3 doses as needed for chest pain.  Marland Kitchen oxybutynin (DITROPAN) 5 MG tablet Start with 1/2 tablet two times daily  . phosphorus (K PHOS NEUTRAL) 155-852-130 MG tablet Take 250 mg by mouth 2 (two) times daily.  . polyethylene glycol (MIRALAX / GLYCOLAX) packet Take 17 g by mouth daily.  . potassium chloride SA (KLOR-CON M20) 20 MEQ tablet Take 1 tablet (20 mEq total) by mouth 2 (two) times daily.  Marland Kitchen ALPRAZolam (XANAX) 0.25 MG tablet Take 0.125-0.25 mg by mouth 3 (three) times daily as needed. For nerves  . donepezil (ARICEPT) 5 MG tablet Take 5 mg by mouth 2 (two) times daily.    No Known Allergies   Current Medications, Allergies, Past Medical History, Past Surgical History, Family History, and Social History were reviewed in [employer] record.    Review of Systems        See HPI - all other systems neg except as noted...      The patient complains of decreased hearing, dyspnea on exertion, peripheral edema, incontinence, muscle weakness, and difficulty walking.  The patient denies anorexia, fever, weight loss, weight gain, vision loss, hoarseness, chest pain, syncope, prolonged cough, headaches, hemoptysis, abdominal pain, melena, hematochezia, severe indigestion/heartburn, hematuria, suspicious skin lesions, transient blindness, depression, unusual weight change, abnormal bleeding, enlarged lymph nodes, and angioedema.     Objective:   Physical Exam      WD, WN, 81 y/o BF in NAD... GENERAL:  Alert, pleasant & cooperative; she is slow moving as noted... HEENT:  [place]/AT, EOM-full, EACs- some wax, TMs-wnl, NOSE-clear, THROAT-clear & wnl. NECK:  Supple w/ fairROM; no JVD; normal carotid impulses w/o bruits; no thyromegaly or nodules palpated; no lymphadenopathy. CHEST:  Clear to P & A; without wheezes/  rales/ or rhonchi., noted kyphosis... HEART:  Regular Rhythm; Gr1/6 SEM, without rubs or gallops heard... ABDOMEN:  Soft & nontender; normal bowel sounds; no organomegaly or masses detected. EXT:  mod arthritic changes; no varicose veins/ +venous insuffic/ 3-4+edema  NEURO:  weak, no focal neuro deficits... DERM:  No lesions noted; no rash etc...  MMSE> 06/13/10> Score 26/30 (missed place, 2/3 recall after distraction, & copy figure)...   Assessment & Plan:    ASHD, s/p NSTEMI, combined sys/diast CHF, VI, edema>   [name]) she declined invasive options, treated medically, but going downhill w/ hypotension and marked incr edema, she will f/u w/ Cards regarding options avail...  =>  MEDS:  ASA81, Plavix75, Lisinopril5, Lasix40Qam, Diamox250Qpm, KCl20Bid, KPhos-1Bid 10/15> we tried to add Diamox250 & K10 but neither were started as requested in the NH... 11/15> she is now home again, we have ordered Diamox250 & K20Bid to be added to her meds=> in follow up it was apparent that she never filled these Rxs. 12/15> Pt is not capable of doing her own meds, son who lives w/ pt & daughter who brings her to OVs are similarly incapable of providing this supervision, I have talked w/ [name] at [name]. 1/16> she appears to be taking her meds properly at this point but still  has 3-4+edema & wt w/o change; Labs improved, therefore incr Lasix to $Remove'80mg'MxTYhGT$ Qam...   AR & ASTHMA>  Breathing is stable, at baseline, she has chr rhinorrhea complaints w/ numerous discussions regarding management of this problem....  Ven Insuffic/ Edema>>  We have had mult conversations at each [place] regarding no salt, elevation, support hose; she developed marked increase edema off the Demadex/ Diamox after hosp 9/15 and never filled the Lasix Rx after the 10/15 hosp; we are adjusting diuretics again as above...  CHOL>  She has the world's best HDL!!!  Electrolye Abn>  See 10/13 Hosp by [facility], it is apparent that her  elev calcium is the result of HYPERPARATHYROIDISM but at age 29 we are reluctant to refer for surg unless progressive... We are following her serum Calcium level & non-progressive. Hypercalcemia>  Prob from Hyperpara & she has stopped the calcium supplements, & we are following the labs...  GU>  She saw [name] w/ extensive investigation into her voiding symptoms & urodynamics as above; rec to use "double voiding" technique... 8/15> still complaining & declines ret to Urology; try Ditropan $RemoveBefo'5mg'pJQQxufaqnB$ - 1/2 tab Bid...  DJD/ Osteopenia>  Aware, she is stable on OTC meds, she has repeatedly refuse Bisphos meds for her bones...  TIA, Dementia>  On ASA81, Plavix75; She & husb [name] are still living independently w/ daugh help but they are very fragile yet won't consider NHP...  Other medical problems as noted...   Patient's Medications  New Prescriptions   No medications on file  Previous Medications   ACETAZOLAMIDE (DIAMOX) 250 MG TABLET    Take 1 tablet (250 mg total) by mouth daily. At 4 pm   ALPRAZOLAM (XANAX) 0.25 MG TABLET    Take 0.125-0.25 mg by mouth 3 (three) times daily as needed. For nerves   ASPIRIN EC 81 MG TABLET    Take 81 mg by mouth daily.   CLOPIDOGREL (PLAVIX) 75 MG TABLET    TAKE 1 TABLET BY MOUTH EVERY DAY   DONEPEZIL (ARICEPT) 5 MG TABLET    Take 5 mg by mouth 2 (two) times daily.   DONEPEZIL (ARICEPT) 5 MG TABLET    TAKE 1 TABLET (5 MG TOTAL) BY MOUTH 2 (TWO) TIMES DAILY.   FEEDING SUPPLEMENT (ENSURE COMPLETE) LIQD    Take 237 mLs by mouth 2 (two) times daily between meals.   FUROSEMIDE (LASIX) 40 MG TABLET    Take 2 tablets (80 mg total) by mouth daily Qam.   GABAPENTIN (NEURONTIN) 100 MG CAPSULE    Take 100 mg by mouth 3 (three) times daily.   LISINOPRIL (PRINIVIL,ZESTRIL) 5 MG TABLET    TAKE 1 TABLET (5 MG TOTAL) BY MOUTH DAILY.   MULTIPLE VITAMIN (MULTIVITAMIN) TABLET    Take 1 tablet by mouth daily.     NITROGLYCERIN (NITROSTAT) 0.4 MG SL TABLET    Place 1 tablet (0.4  mg total) under the tongue every 5 (five) minutes x 3 doses as needed for chest pain.   OXYBUTYNIN (DITROPAN) 5 MG TABLET    Start with 1/2 tablet two times daily   PHOSPHORUS (K PHOS NEUTRAL) 155-852-130 MG TABLET    Take 250 mg by mouth 2 (two) times daily.   POLYETHYLENE GLYCOL (MIRALAX / GLYCOLAX) PACKET    Take 17 g by mouth daily.   POTASSIUM CHLORIDE SA (KLOR-CON M20) 20 MEQ TABLET    Take 1 tablet (20 mEq total) by mouth 2 (two) times daily.  Modified Medications   No medications on file  Discontinued Medications   No medications on file

## 2014-12-30 MED ORDER — FUROSEMIDE 40 MG PO TABS: ORAL_TABLET | ORAL | Status: DC

## 2014-12-30 NOTE — Addendum Note (Signed)
Addended by: Marcellus Scott on: 12/30/2014 05:25 PM   Modules accepted: Orders

## 2015-01-05 ENCOUNTER — Telehealth: Payer: Self-pay | Admitting: Pulmonary Disease

## 2015-01-05 DIAGNOSIS — F411 Generalized anxiety disorder: Secondary | ICD-10-CM

## 2015-01-05 DIAGNOSIS — R0600 Dyspnea, unspecified: Secondary | ICD-10-CM

## 2015-01-05 DIAGNOSIS — F0390 Unspecified dementia without behavioral disturbance: Secondary | ICD-10-CM

## 2015-01-05 NOTE — Telephone Encounter (Signed)
Spoke with [name]- requesting an order for pill box (with education on use) and an order for occupational therapy eval. FYI: Heart rate is running between 40-60 range. Heart rate has been ranging in 40's (around 48) this week.  Please advise Dr Kriste Basque. Thanks.

## 2015-01-05 NOTE — Telephone Encounter (Signed)
Order has been sent to [facility] for the following orders.  SN is aware of HR.

## 2015-01-11 ENCOUNTER — Telehealth: Payer: Self-pay | Admitting: Pulmonary Disease

## 2015-01-11 NOTE — Telephone Encounter (Signed)
lmomtcb x 1 for cora

## 2015-01-11 NOTE — Telephone Encounter (Signed)
[name] returned call [phone]

## 2015-01-11 NOTE — Telephone Encounter (Signed)
Called and spoke with cora and she stated that at her visit today with the pt she noticed some crackles in bilateral lung bases---worse on the right.  Pt denied any increase SOB, fatigue.  pts vitals and weight were all good today 132/54 52 pulse 98 % room air No fever Ankles are swollen as usual  Cora wanted to call and make SN aware.  Please advise if any further recs are needed  No Known Allergies  Current Outpatient Prescriptions on File Prior to Visit  Medication Sig Dispense Refill  . acetaZOLAMIDE (DIAMOX) 250 MG tablet Take 1 tablet (250 mg total) by mouth daily. At 4 pm 30 tablet 6  . ALPRAZolam (XANAX) 0.25 MG tablet Take 0.125-0.25 mg by mouth 3 (three) times daily as needed. For nerves    . aspirin EC 81 MG tablet Take 81 mg by mouth daily.    . clopidogrel (PLAVIX) 75 MG tablet TAKE 1 TABLET BY MOUTH EVERY DAY 30 tablet 0  . donepezil (ARICEPT) 5 MG tablet Take 5 mg by mouth 2 (two) times daily.    Marland Kitchen donepezil (ARICEPT) 5 MG tablet TAKE 1 TABLET (5 MG TOTAL) BY MOUTH 2 (TWO) TIMES DAILY. 180 tablet 1  . feeding supplement (ENSURE COMPLETE) LIQD Take 237 mLs by mouth 2 (two) times daily between meals. 60 Bottle 0  . furosemide (LASIX) 40 MG tablet Take 2 tablets by mouth every morning 60 tablet 1  . gabapentin (NEURONTIN) 100 MG capsule Take 100 mg by mouth 3 (three) times daily.    Marland Kitchen lisinopril (PRINIVIL,ZESTRIL) 5 MG tablet TAKE 1 TABLET (5 MG TOTAL) BY MOUTH DAILY. 30 tablet 5  . Multiple Vitamin (MULTIVITAMIN) tablet Take 1 tablet by mouth daily.      . nitroGLYCERIN (NITROSTAT) 0.4 MG SL tablet Place 1 tablet (0.4 mg total) under the tongue every 5 (five) minutes x 3 doses as needed for chest pain. 25 tablet 1  . oxybutynin (DITROPAN) 5 MG tablet Start with 1/2 tablet two times daily 60 tablet 6  . phosphorus (K PHOS NEUTRAL) 155-852-130 MG tablet Take 250 mg by mouth 2 (two) times daily.    . polyethylene glycol (MIRALAX / GLYCOLAX) packet Take 17 g by mouth daily.    .  potassium chloride SA (KLOR-CON M20) 20 MEQ tablet Take 1 tablet (20 mEq total) by mouth 2 (two) times daily. 60 tablet 6   No current facility-administered medications on file prior to visit.

## 2015-01-11 NOTE — Telephone Encounter (Signed)
Per SN---  No salt Keep her legs elevated Continue the lasix 40 mg  2 tabs every morning  Diamox at 4 pm daily Keep her cardiology appt on 1/29  Called and spoke with cora and she is aware of SN recs and will forward this message along to the pt.

## 2015-01-13 ENCOUNTER — Ambulatory Visit (INDEPENDENT_AMBULATORY_CARE_PROVIDER_SITE_OTHER): Payer: 59 | Admitting: Cardiovascular Disease

## 2015-01-13 ENCOUNTER — Encounter: Payer: Self-pay | Admitting: Cardiovascular Disease

## 2015-01-13 VITALS — BP 120/70 | HR 60 | Ht 60.0 in | Wt 112.5 lb

## 2015-01-13 DIAGNOSIS — I509 Heart failure, unspecified: Secondary | ICD-10-CM

## 2015-01-13 DIAGNOSIS — R609 Edema, unspecified: Secondary | ICD-10-CM

## 2015-01-13 DIAGNOSIS — I10 Essential (primary) hypertension: Secondary | ICD-10-CM

## 2015-01-13 NOTE — Progress Notes (Signed)
01/13/2015 [name]   1911/04/19  [id number]  Primary Physician NADEL,SCOTT Judie Petit, MD Primary Cardiologist: Runell Gess MD [name]   HPI:  [name] is a 81 year old frail appearing married African-American female who lives with her 43 year old husband and accompanied by her daughter. Her primary care physician is Dr. Migdalia Dk. She was last seen here in October by Franky Macho every 4 hours. She was hospitalized several times last year for congestive heart failure. She is a DO NOT RESUSCITATE. Her EF is 45-50% by 2-D echo. Her other problems include chronic lower extremity edema. She does walk with the aid of a walker at home. She denies chest pain or shortness of breath. Problems include a history of hypertension, hyperlipidemia.   Current Outpatient Prescriptions  Medication Sig Dispense Refill  . acetaZOLAMIDE (DIAMOX) 250 MG tablet Take 1 tablet (250 mg total) by mouth daily. At 4 pm 30 tablet 6  . ALPRAZolam (XANAX) 0.25 MG tablet Take 0.125-0.25 mg by mouth 3 (three) times daily as needed. For nerves    . aspirin EC 81 MG tablet Take 81 mg by mouth daily.    . clopidogrel (PLAVIX) 75 MG tablet TAKE 1 TABLET BY MOUTH EVERY DAY 30 tablet 0  . donepezil (ARICEPT) 5 MG tablet Take 5 mg by mouth 2 (two) times daily.    Marland Kitchen donepezil (ARICEPT) 5 MG tablet TAKE 1 TABLET (5 MG TOTAL) BY MOUTH 2 (TWO) TIMES DAILY. 180 tablet 1  . feeding supplement (ENSURE COMPLETE) LIQD Take 237 mLs by mouth 2 (two) times daily between meals. 60 Bottle 0  . furosemide (LASIX) 40 MG tablet Take 2 tablets by mouth every morning 60 tablet 1  . gabapentin (NEURONTIN) 100 MG capsule Take 100 mg by mouth 3 (three) times daily.    Marland Kitchen lisinopril (PRINIVIL,ZESTRIL) 5 MG tablet TAKE 1 TABLET (5 MG TOTAL) BY MOUTH DAILY. 30 tablet 5  . Multiple Vitamin (MULTIVITAMIN) tablet Take 1 tablet by mouth daily.      . nitroGLYCERIN (NITROSTAT) 0.4 MG SL tablet Place 1 tablet (0.4 mg total) under the  tongue every 5 (five) minutes x 3 doses as needed for chest pain. 25 tablet 1  . oxybutynin (DITROPAN) 5 MG tablet Start with 1/2 tablet two times daily 60 tablet 6  . phosphorus (K PHOS NEUTRAL) 155-852-130 MG tablet Take 250 mg by mouth 2 (two) times daily.    . polyethylene glycol (MIRALAX / GLYCOLAX) packet Take 17 g by mouth daily.    . potassium chloride SA (KLOR-CON M20) 20 MEQ tablet Take 1 tablet (20 mEq total) by mouth 2 (two) times daily. 60 tablet 6   No current facility-administered medications for this visit.    No Known Allergies  History   Social History  . Marital Status: Married    Spouse Name: cecil    Number of Children: 2  . Years of Education: N/A   Occupational History  . Retired Database administrator; Manufacturing engineer   Social History Main Topics  . Smoking status: Never Smoker   . Smokeless tobacco: Never Used  . Alcohol Use: No  . Drug Use: No  . Sexual Activity: Not on file   Other Topics Concern  . Not on file   Social History Narrative     Review of Systems: General: negative for chills, fever, night sweats or weight changes.  Cardiovascular: negative for chest pain, dyspnea on exertion, edema, orthopnea, palpitations, paroxysmal nocturnal  dyspnea or shortness of breath Dermatological: negative for rash Respiratory: negative for cough or wheezing Urologic: negative for hematuria Abdominal: negative for nausea, vomiting, diarrhea, bright red blood per rectum, melena, or hematemesis Neurologic: negative for visual changes, syncope, or dizziness All other systems reviewed and are otherwise negative except as noted above.    Blood pressure 120/70, pulse 60, height 5' (1.524 m), weight 112 lb 8 oz (51.03 kg).  General appearance: alert and no distress Neck: no adenopathy, no carotid bruit, no JVD, supple, symmetrical, trachea midline and thyroid not enlarged, symmetric, no tenderness/mass/nodules Lungs: clear to auscultation  bilaterally Heart: regular rate and rhythm, S1, S2 normal, no murmur, click, rub or gallop Extremities: 2+ pitting edema wearing compression stockings  EKG not performed today  ASSESSMENT AND PLAN:   Essential hypertension History of hypertension with blood pressure measured today at 120/70. She is on lisinopril. Continue current meds at current dosing.   Edema History of chronic lower extremity edema wearing compression stockings. She is on a diuretic as well.   CHF exacerbation She had 2 admissions in the fall of last year for CHF. Her EF by 2-D echo was 45-50% with a new wall motion and [place] in the LAD distribution. Medical therapy was recommended. She denies chest pain or shortness of breath though she is minimally ambulatory.       Runell Gess MD FACP,FACC,FAHA, [facility] 01/13/2015 3:50 PM

## 2015-01-13 NOTE — Patient Instructions (Signed)
Dr Allyson Sabal recommends that you schedule a follow-up appointment in 6 months with an extender - Corine Shelter, PA-C. You will receive a reminder letter in the mail two months in advance. If you don't receive a letter, please call our office to schedule the follow-up appointment.  Dr Allyson Sabal wants you to follow-up in 12 months. You will receive a reminder letter in the mail two months in advance. If you don't receive a letter, please call our office to schedule the follow-up appointment.

## 2015-01-13 NOTE — Assessment & Plan Note (Signed)
History of chronic lower extremity edema wearing compression stockings. She is on a diuretic as well.

## 2015-01-13 NOTE — Assessment & Plan Note (Signed)
History of hypertension with blood pressure measured today at 120/70. She is on lisinopril. Continue current meds at current dosing.

## 2015-01-13 NOTE — Assessment & Plan Note (Signed)
She had 2 admissions in the fall of last year for CHF. Her EF by 2-D echo was 45-50% with a new wall motion and [place] in the LAD distribution. Medical therapy was recommended. She denies chest pain or shortness of breath though she is minimally ambulatory.

## 2015-01-16 ENCOUNTER — Other Ambulatory Visit: Payer: Self-pay | Admitting: Pulmonary Disease

## 2015-01-18 ENCOUNTER — Telehealth: Payer: Self-pay | Admitting: Pulmonary Disease

## 2015-01-18 NOTE — Telephone Encounter (Signed)
Called and spoke with diane from [facility] and she was given VO .  Nothing further is needed.

## 2015-01-18 NOTE — Telephone Encounter (Signed)
Called and spoke to [place] with [facility]. [name] is requesting a verbal order for the pt to have a bath aide, 1 visit this week and then 2 visits for the next 2 weeks.   SN please advise.

## 2015-01-21 ENCOUNTER — Other Ambulatory Visit: Payer: Self-pay | Admitting: Adult Health

## 2015-01-30 ENCOUNTER — Ambulatory Visit: Payer: 59 | Admitting: Pulmonary Disease

## 2015-02-01 ENCOUNTER — Other Ambulatory Visit: Payer: Self-pay | Admitting: Adult Health

## 2015-02-02 ENCOUNTER — Other Ambulatory Visit: Payer: Self-pay | Admitting: *Deleted

## 2015-02-02 MED ORDER — OXYBUTYNIN CHLORIDE 5 MG PO TABS: ORAL_TABLET | ORAL | Status: DC

## 2015-02-02 MED ORDER — GABAPENTIN 100 MG PO CAPS: 100.0000 mg | ORAL_CAPSULE | Freq: Three times a day (TID) | ORAL | Status: DC

## 2015-02-07 ENCOUNTER — Encounter: Payer: Self-pay | Admitting: Pulmonary Disease

## 2015-02-07 ENCOUNTER — Ambulatory Visit (INDEPENDENT_AMBULATORY_CARE_PROVIDER_SITE_OTHER): Payer: 59 | Admitting: Pulmonary Disease

## 2015-02-07 VITALS — BP 100/62 | HR 60 | Temp 97.7°F | Ht 60.0 in | Wt 113.1 lb

## 2015-02-07 DIAGNOSIS — I5042 Chronic combined systolic (congestive) and diastolic (congestive) heart failure: Secondary | ICD-10-CM

## 2015-02-07 DIAGNOSIS — R269 Unspecified abnormalities of gait and mobility: Secondary | ICD-10-CM

## 2015-02-07 DIAGNOSIS — R531 Weakness: Secondary | ICD-10-CM

## 2015-02-07 DIAGNOSIS — I2510 Atherosclerotic heart disease of native coronary artery without angina pectoris: Secondary | ICD-10-CM

## 2015-02-07 DIAGNOSIS — R609 Edema, unspecified: Secondary | ICD-10-CM

## 2015-02-07 DIAGNOSIS — I4510 Unspecified right bundle-branch block: Secondary | ICD-10-CM

## 2015-02-07 DIAGNOSIS — I872 Venous insufficiency (chronic) (peripheral): Secondary | ICD-10-CM

## 2015-02-07 DIAGNOSIS — F0390 Unspecified dementia without behavioral disturbance: Secondary | ICD-10-CM

## 2015-02-07 NOTE — Progress Notes (Signed)
Subjective:    Patient ID: Jessica [name], female    DOB: August 22, 1911, 81 y.o.   MRN: [id number]  HPI 81 y/o BF here for a follow up visit...  She has mult medical problems including:  AR & runny nose;  Asthmatic Bronchitis;  HBP;  Chronic VI & edema;  Hyperchol;  GERD & esoph stricture;  Divertics;  Hx Urinary incontinence & UTIs;  DJD/ Osteoporosis;  Semile Dementia;  Anxiety...   ~  SEE PREV EPIC NOTES FOR EARLIER DATA >>  ~  March 23, 2013:  2-527moROV & [name] c/o ankle swelling which tends to go down overnight- we discussed stopping her Amlodipine & redoubling efforts at no salt, elevation, support hose, & Demadex;  also c/o fine rash ?generalized w/ pruritis & we discussed Rx w/ Lotrisone cream & Atarax 231mprn... She is once again asking for yet another round of home PT therapy...  We reviewed the following medical problems during today's office visit >>     HBP> on ASA81, Amlod2.5, Lisin5, Demadex20-1/2, Diamox250, & K10-3/d + KPhos-1Bid; BP=100/62 & she is feeling sl weak w/ mult somatic complaints; labs 4/14 show K=3.7, Phos=3.4, Cr=1.1; we decided to STOP the Amlodipine & work on sodium restriction.    VI, edema> she knows to elim sodium, elev legs, wear support hose, take Demadex 2022m...    Chol> on diet alone; she has a very hi HDL ~150!!!    Hypercalcemia> assoc w/ low phos- c/w primary hyperparathyroidism- she is 81y/o & not felt to be an operative candidate; she was started on KPhos 10/13 in [facility] w/ improvement in serum Phos to 3.6; serum Ca = 11.2 now but PTH is not rising.    GI- GERD, Divertics> off prev Prilosec20, Miralax, Senakot-S; advised these all as needed...    GU> saw DrTannenbaum for Urology 10/12- mult complaints, urodynamics done w/ sm hypersens bladder unstable w/ leakage, & large bladder divertic> he rec double voiding technique...    DJD, Osteopenia> off Ca supplements they sent her home from hosp w/ Phos-2Bid...    Senile Dementia> on Aricept10  & Alpraz0.25 as needed... We reviewed prob list, meds, xrays and labs> see below for updates >>   LABS 4/14:  Chems- Ca=11.2 but PTH is sl lower than prev at 86;  Phos=3.4 on KPhos1tabBid; K= 3.7 on this & K10- 2AM+1PM;  LFTs= wnl and Alb= 4.0 on Ensure...  ~  May 20, 2013:  48mo87mo & add-on for swelling> [name] w/ c/o swelling in her legs; ?if she recalls our prev conversation about salt, elevation, support hose; she is taking the Demadex20mg53m& Diamox250mg 75malong w/ her KCl & KPhos=> labs today show: K=3.6, BUN=17, Cr=1.0;  Exam shows 3+edema in feet & lower legs, chest clear, heart w/ Gr1/6 SEM w/o rubs or gallops apprec;  We reviewed the need to Elim salt, Elevate the legs, Wear support hose, & we decided to STOP Lisinopril (BP=110/68) and INCREASE the Demadex20 to 2tabsQam...  ROV in 4-6weeks recheck; hopefully she can keep her visiting nurses, home help, etc (she will not go willingly to a NH or AL facility)...     We reviewed prob list, meds, xrays and labs> see below for updates >>   ~  June 23, 2013:  27mo RO24moJulia iJimiae w/ her daugh & her husb Jessica [name] (Laveda Abbeseen 12/11 & asked to f/u in 18mo);  80mo OV we increased her Demadex to 20-2Qam + her Diamox250/d  along w/ her KCl & KPhos- she is improved having lost 3# down to 110# & less edema; BP is improved as well at 120/62;  Her CC today is some burning discomfort in the bottom of her feet esp when standing &walking; we discussed Neuropathy symptoms & she requests med rx trial- try Gabapentin 100->3228m/d...  We reviewed prob list, meds, xrays and labs> see below for updates >>   Labs 7/14:  Chems- wnl x Ca=10.8, continue same meds...  ~  August 24, 2013:  240moOV & Jessica [name] says she was Dx w/ Scabies yest by [facility], we do not have notes; she says she got it from her husb who picked it up while in rehab; her CC is itching & hopefully the Kwell lotion will take care of all this; she states that her neuropathy sx are better on the  Gabapentin100Tid; She will turn [age]/o next month...  BP remains well controlled on diuretics alone & measures 116/64 today... We reviewed prob list, meds, xrays and labs> see below for updates >>  OK 2014 FLU vaccine today...   ~  October 25, 2013:  28m78moV & Jessica [name] turned 81y/o & she framed her letter from [place]; states she's doing fair- same old problems, pain in left shoulder & she wants more PT, but I offered Ortho eval for XRay & poss injection instead...     BP is controlled on ASA81, Demadex20-2Qam, Diamox250, & K10-3/d + KPhos-1Bid (off prev Amlod & Lisin); BP=122/70 & she denies angina pain, palpit, ch in SOB etc; K is sl low at 3.2 7 rec to incr KCl10 to 2Bid...    Serum Calcium id ok at 10.3- no progressive incr & we continue to monitor this parameter...    DJD is treated w/ OTC analgesics and rec to f/u w/ Ortho re: shoulder pain...    She has a senile dementia- on Aricept10 and Alpraz0.25 for prn use... We reviewed prob list, meds, xrays and labs> see below for updates >>   LABS 11/14:  Chems- ok x K=3.2, BUN=24, Cr=1.1;  CBC w/ Hg=11.8;  TSH=2.48...  ~  March 15, 2014:  30mo45mo & Jessica [name] has the same chr complaints- generally stable, notes leaking bladder (prev eval DrTannenbaum), burning feet (saw Podiatry-DrRegal), occas indigestion... She notes that she will be [facility] she wants ENT referral to check her ears for lavage...     She notes that her breathing is fine- no recent resp exac, cough, sputum, ch in SOB, etc...    BP= 106/64 on Demadex20Qam & Diamox250Qpm w/ K10-2Bid and KPhos1Bid; see labs below...    She ambulates w/ cane & walker, denies CP/ palpit/ ch in edema...     She notes some urinary incont & wears depends, no burning etc...    She remains on Aricept5Bid- she prefers it this way & won't change... We reviewed prob list, meds, xrays and labs> see below for updates >> she did not bring med list or med bottles & reminded  to bring bottles to every OV...   LABS 1/15:  Chems- ok w/ K=3.5, Cr=1.2  ~  May 20, 2014:  28mo 130mo& Jessica [name] is stable, same chr complaints which she like to discuss Q28mo..230mo  HBP> on ASA81, Demadex20-2Qam, Diamox250-at 4pm, & K10-2Bid + KPhos-1Bid; BP=100/60 & labs 6/15 show K=4.0, Phos=2.5, Cr=1.1; continue same meds for now...    VI, edema> she knows to  elim sodium, elev legs, wear support hose, take Demadex & Diamox as directed...    Chol> on diet alone; she has a very hi HDL ~150 when measured in 2012...    Hypercalcemia> assoc w/ low phos- c/w primary hyperparathyroidism- she is 81y/o & not felt to be an operative candidate; we are following Ca, PO4, PTH levels...     GI- GERD, Divertics> off prev Prilosec20, Miralax, Senakot-S; advised these all as needed...    GU> saw DrTannenbaum for Urology 10/12- mult complaints, urodynamics done w/ sm hypersens bladder unstable w/ leakage, & large bladder divertic> he rec double voiding technique...    DJD, Osteopenia> off Ca supplements, on KPhos-2Bid, on MVI & encouraged to walk as much as poss...    Senile Dementia> on Aricept10 & Alpraz0.25 as needed... We reviewed prob list, meds, xrays and labs> see below for updates >>   LABS 6/15:  Chems- ok x Ca=11.2, PO4=2.5, PTH=108 c/w primary hyperpara;  CBC- Hg=12.3;  TSH=2.09...   ~  July 20, 2014:  31moROV & JHayaremains stable overall- states that she has been doing fairly well but has a list of questions and wants me to re-order visiting nurses to check on her & husb every week or so (I explained that we [facility] & see what she is eligible for under Medicare)...     1) she wants a nasal spray for drippy nose> try ASTELIN 1-2 sp in each nostril Bid as needed...    2) she is c/o burning discomfort in legs & we reviewed her Neuropathy Dx & the reason for the Gabapentin Rx- she is only taking 1029mBid & gets some benefit from this she says; offered to incr dose but  she can't remember to take it Tid 7 dosen't want to incr to 2Bid so we decided to keep it the same for now...    3) she is c/o urinary symptoms- leaking, some incont, denies burning/ odor/ pain/ blood etc; we discussed checking UA & C&S; we reviewed prev evals from DrNesi & DrTannenbaum- offered ROV w/ these providers but she declined; finally agreed to try Ditropan5m50m1/2tab bid for her symptoms and let me know if she is better...    4) she wants f/u labs and is due for BMet & Phos level (f/u hyperpara on observation)...  We reviewed prob list, meds, xrays and labs> see below for updates >> she brought med bottles and we reviewed her Rx today...  LABS 8/15:  Chems- ok x K=3.1 and Cr=1.2;  Phos=2.9;  UA= clear...   ~  September 20, 2014:  61mo69mo & post hosp visit> JuliKalifa Adm by Cards 9/14 - 09/02/14 w/ SOB, she denied CP, EKG was unchanged but Troponins were elev, D-dimer was also abn & a CTAngioChest was done- neg for PE, and VenDopplers were neg for DVT; she was treated w/ Heparin transiently;  2DEcho showed decr LVF w/ EF=45-50% w/ areas of HK, & Gr1DD w/ mild AS/AI- felt to have prob anteroseptal MI w/ distal LAD lesion likely but she refused invasive eval & was made a NCB/ DNR/ med rx only;  Meds were adjusted w/ Hep=> Plavix added, BBlocker continued, and ACE added; [facility]...  In the rehab facility she was managed by the SeniHappyir notes are reviewed...  She was disch home 10/2 w/ home healt PT/ OT/ CNA/  Nursing/ and Education officer, museum all arranged...  After disch she returned to the ER 10/3 w/ paresthesias and weakness> seen by ER staff & Neuro consultant; BP was recorded at 180-190 sys, CT Brain & MRI were neg for acute infarct & she was felt to have had a TIA- already on Plavix & w/ nothing further to offer she was sent home...  She comes in today w/ daugh & appears weaker, BP is low at 92/64, marked increase in leg edema since  off her diuretics (stopped during recent hosp w/ her NSTEMI), going downhill but ever stoic she says she is doing satis at home w/ BSC, walker, etc; they truly love the home health care assist w/ Pt/ OT/ HHN/ aides/ & social worker; they again decline to consider NHP for pt & her husband...  We reviewed the following medical problems during today's office visit >>     HBP> prev on Demadex20-2Qam, Diamox250-at 4pm, & K10-2Bid + KPhos-1Bid; but now off all diuretics on MetopER25-1/2 daily, Lisinopril5 & KPhos-1Bid; BP=92/64 & no wiggle room to add back diuretics=> she will f/u w/ Cards soon...     ASHD, s/p NSTEMI, ischemic cardiomyopathy, combined sys & diast CHF> Adm 9/15 by Cards w/ NSTEMI & combined sys/diast CHF; she declined intervention; medical therapy adjusted- off diuretics, added MetopER12.5/ Lisin5/ ASA81/ Plavix75, but edema incr.    VI, edema> off diuretics now per Cards adjustment 9/15; she knows to elim sodium, elev legs, wear support hose; she has f/u appt w/ Cards soon...    Chol> on diet alone; she has a very hi HDL ~150 when measured in 2012...    Hypercalcemia> assoc w/ low phos- c/w primary hyperparathyroidism- she is 81y/o & not felt to be an operative candidate; we are following Ca, PO4, PTH levels...     GI- GERD, Divertics> off prev Prilosec20, Miralax, Senakot-S; advised these all as needed...    GU> saw DrTannenbaum for Urology 10/12- mult complaints, urodynamics done w/ sm hypersens bladder unstable w/ leakage, & large bladder divertic> he rec double voiding technique & Ditropan5-1/2Bid...    ?TIA, Neuropathy> on Neurontin100Tid; she went to the ER 10/15 one day after disch from the NH w/ numbness=> NEG CTBrain & MRI w/o acute changes, symptoms passed & sent home on same ASA81 + Plavix75/d...    DJD, Osteopenia> off Ca supplements, on KPhos-1Bid, on MVI & encouraged to walk as much as poss...    Senile Dementia> on Aricept5 & Alpraz0.25 as needed... We reviewed prob list, meds,  xrays and labs> see below for updates >>   LABS 10/15:  FLP- ok on diet alone w/ HDL=159!  Chems- ok w/ Cr=1.01, K=4.1, BS=104, A1c=6.0, Ca=10.8;  CBC- Hg=11.7;  TSH=2.48...  CXR 9/15 showed mild cardiomeg, clear lungs, poor inspiration, osteopenia, scoliosis w/o change, NAD.Marland KitchenMarland Kitchen  EKG 9/15 showed SBrady, rate47, PACs, RBBB, left axis deviation, NSSTTWA...   2DEcho 9/15 showed mild LVH, reduced LVF w/ EF=45-50% w/ HK in apex & septum, Gr1DD, mild AS/AI, mildly thickened MV leaflets & mild LAdil, PAsys=40...  CT Angio Chest 9/15 showed neg for PE, mildly enlarged cardiac chambers, Ao looked ok, COPD/E bilat w/ atx vs scarring at bases, scoliosis & DDD, ?nonobstructing stone in mid right kidney...  CT Head 10/15 showed mild diffuse atrophy, no acute changes...  MRI Head 10/15 revealed no infarct, hemorrhage, or mass; mild atrophy & sm vessel dis, NAD...  PLAN>> she has gained 19# since last here w/ marked edema (4+bilat in legs) since she's been off  her diuretics; however BP is low at 92/64 on her low dose Metop & Lisin per Cards; advised no salt/ elev legs/ support hose/ & f/u w/ Cards ASAP as planned (not many options here- perhaps stop BBlocker, add diuretic, consider pacer);  She is happy w/ all the home health support...  ~  October 06, 2014:  2wk ROV & post-hosp visit> after last OV she was hosp by [facility] from 10/9 - 09/29/14 w/ incr SOB (O2sat=84% on RA), edema (BNP=9280, CXR w/ pulm edema), and weakness; but no CP or other symptoms and troponins were neg;  She was diuresed and her meds were adjusted (Metoprolol stopped due to low heart rate); disch to NHP for rehab and she is loving the attention "they are working with me"- on Lasix40 & KPhos bid, plus her ASA/ Plavix/ Lisin5...  Her CC is constipation> going every 3-4 days & firm, no blood reported; she is rec to start a regimen w/ Miralax daily & Senakot-S 2Qhs... We discussed rechecking her CXR (improved aeration) and LABS (K=3.1,  HCO3=42, Cr=1.1, BNP=485) today... REC to continue Lasix40, Add back the Diamox250/d & the KCl- 27mq caps 2/d as before... ROV in 2-3 weeks...     We reviewed prob list, meds, xrays and labs> see below for updates >>   CXR 10/15 Hosp showed cardiomeg, engorged pulm vasculature, increased markings, can't r/o effusions, all c/w pulm edema...   CXR 10/15 office f/u showed cardiomeg, improved aeration, persist bilat effusions...  LABS 10/15 office f/u>  Chems- K=3.1, HCO3=42, BUN=16, Cr=1.1, Ca=10.4, BNP=485...   ~  October 25, 2014:  3wk ROV & JMagdalais still in [employer] to be released home in 3d her daughter says; [facility] w/ visiting nurses, PT/ OT, etc; Phys therapy has checked her & the home situation & they feel she is safe for DC home; Daughter needs schooling on pt's home care needs, feeding, use of "thick-it", and wants to know "how to keep the pill box filled"...  JMakenzehad [facility] f/u 10/22 w/ PA-Luke & they felt she was doing OK, no changes made, f/u 337mo I note that the Diamox & KCl we tried to add after her last OV here is not on her list- ?what happened? Since she was here last she has lost another 5# but still has 2+edema in LEs, breathing is OK, she is avoiding salt she says;    Hx HBP, ASHD, s/pNSTEMI, ischemic cardiomyop (EF=45-50% w/ HK), combined sys&diast CHF, etc> on ASA81, Plavix75, Lisin5, Lasix40, KPhos2/d; Prev ordered Diamox250 & K10 are not on her list; BP= 122/64 & she denies ch in SOB, edema (2+), etc;  Labs w/ K=2.7, HCO3=40, Cr=1.1, BNP=311;  CXR w/ sm bilat effusions persist;  Plan is to add the Diamox250 one daily at 4PM, & KCl2072mid, continue other meds the same...     On Miralax, Senakot, Ensure> not taking PPI now; Hx GERD, Divertics; needs thick-it for her fluids per speech path & daughter must be taught...    On Ditropan5-1/2Bid for bladder...    On Neurontin100Tid for neuropathy...    On Aricept5 for  memory & Alprazolam 0.25 prn nerves...  We reviewed prob list, meds, xrays and labs> see below for updates >> we reconciled med lists   CXR 11/15 showed small bilat effusions and rounded area ?fluid in fissure) on left lat chest...  LABS 11/15:  Chems- K=2.7, HCO3=40, Cr=1.1;  BNP=311... PLAN>>  Continue meds,  we started KCl 28mq Bid and added DIAMOX2535mone tab at 4PM daily... ROV in 3 wks.  ~  November 28, 2014:  85m40moV & recheck> despite all efforts- conversation w/ daughter who brings pt to office/ son who lives w/ pt/ visiting nurses from [facility] still not taking the prescribed meds properly, Pharm [facility] that she hasn't filled Lasix, Diamox, or the KCl!...   I discussed this w/ the daughter but she doesn't understand & called her brother, I talked to him on the phone and he doesn't know the meds or have interest in providing this service for his mother;  Finally I called [phone] to [place] to increase services to MrsSolomon to whatever level is needed to provide the needed support 7 total supervision of her meds etc...    CV meds> ASA81, Plavix75, Lisinopril5, Lasix40Qam, Diamox250Qpm, KCl20Bid, KPhos-1Bid    Neuro meds> Aricept5Bid, Neurontin100Tid, Xanax0.25Tid prn    Others> Ditropan5-1/2Bid, Miralax daily, MVI, Ensure  ~  December 29, 2014:  85mo55mo & recheck> JuliMariellahere w/ her son (first time I have met him) who does her meds; [facility] been coming out but apparently NOT supervising her meds (no notes or calls from them)- still done by son,placing pills in a weekly med box & checking to be sure she is taking it properly; they brought all her med bottles today- [facility]date of last [facility]); unfortunately she has not lost any edema (still 3-4+ in both feet/ lower legs and her wt is the same as last month ~114#.  She feels the same- denies CP, palpit, SOB, dizzy, syncope,  etc; remains weak, in wheelchair at 81y/o, & VS are stable w/ BP=100/52, pulse= 60/min, O2sat=94% on RA; Chest has diminished BS at bases bilat but no wheezing, rales, rhonchi; Cardiac exam unchanged w/ Gr1/6 SEM no rubs or gallops apprec...     We reviewed prob list, meds, xrays and labs> see below for updates >>   LABS 1/16:  Chems- ok w/ K=3.9, BS=100, Cr=1.14, Po4=2.9...  Marland KitchenMarland KitchenAN>> we are checking her metabolic labs to see if we can safely increase her Lasix dose to affect a better diuresis; in the meanwhile she is reminded to elim salt/ sodium for her diet, elev legs, wear support hose; given 2gm Na diet sheet; we plan ROV recheck in 85mo.67mo> Chems OK therefore increase Lasix40mg-12mbsQam.  ~  February 07, 2015:  85mo RO65moshe is here w/ her son> they assure me that she is taking her meds properly as outlined in her med list... She states that she is doing satis, stable, and she denies SOB; still wheelchair bound, difficulty getting up & difficulty w/ personal care needs; she feels that the swelling in feet/legs is about the same & weight is 113# down 1# today...    She saw DrBerry for Cards 1/16> HBP, ASHD, s/pNSTEMI, ischemic cardiomyop w/ combined sys&diast CHF, etc> 2DEcho 9/15 showing mild LVH, reduced LVF w/ EF=45-50% w/ HK in apex & septum, Gr1DD, mild AS/AI, mildly thickened MV leaflets & mild LAdil, PAsys=40;  On ASA81, Plavix75, Lisin5, Lasix40-2Qam, Diamox250Qpm, KCl20BiGunnisona looks about the same at 3+ in feet & lower legs, exam otherw unchanged and they did not add or change any meds, f/u planned 68mo... 285moe reviewed prob list, meds, xrays and labs> see below for updates >>  PLAN>>  We decided to continue same meds for now; reminded- NO SALT/ SODIUM in diet;  Wear support hose & keep legs up;  ROV in 6 weeks w/ CXR & lab work planned...         Problem List:  Hx of ALLERGIES (ICD-995.3) - she uses OTC meds as needed, & we prev discussed Zyrtek 24mQam, Saline nasal  mist, & Atrovent Nasal 0.03% Tid for drainage (one of her chronic complaints)... ~  8/15: we tries Astelin nasal spray for her chr complaints of dripping...  Hx of ASTHMATIC BRONCHITIS, ACUTE (ICD-466.0) - no recent exacerbations... ~  CXR 5/13 showed borderline heart size, kyphoscoliosis, no edema, and clear lungs... ~  CXR 10/13 showed normal heart size, clear lungs, kyphoscoliosis, osteopenia... ~  CXR 10/15> Hosp w/ CHF (cardiomeg, bilat effusions and interstitial edema); she was diuresed and disch to NHP/rehab=> f/u CXR 10/15 w/ improved aeration, persist bilat effusions... ~  CXR 11/15 showed small bilat effusions and rounded area ?fluid in fissure) on left lat chest.  HYPERTENSION (ICD-401.9) >>  ~  controlled on ASA 866md, NORVASC5m61m/2tab, LISINOPRIL5 & DEMADEX 25m13m& DIAMOX250mg44m. ~  4/12:  BP= 110/60 and she states that she is taking her meds regularly & tolerating them well... denies HA, visual changes, CP, palpit, change in dyspnea, etc... but notes weakness, intermittently dizzy & persist edema in her feet...  ~  6/12:  BP= 124/70 & she remains stable... ~  8/12:  BP= 110/ 66 & stable... ~  10/12:  BP= 114/62 & stable... ~  1/13:  BP= 110/64 & she remains stable... ~  4/13:  BP= 116/58 & she is reminded to adjust Lasix 1-2 Qam according to her edema... ~  5/13:  BP= 140/72 & she is c/o pedal edema; we decided to change to Demadex 25mg-3mm. ~  7/13:  BP= 110/60 & her edema is diminished, still 1+ in feet... Labs showed HCO3=39 & DIAMOX 250mg/d25med. ~  8/13:  BP= 118/74 & edema is the same; ?what she's taking? & we decided to simplify regimen> Norvasc1/2, Lisin1/2, Demadex-1/d & Diamox-1/d... ~  9/13:  BP= 110/70 & she is clinically stable on these meds; add K10Bid for the K=3.4 today... ~  EKG 10/13 showed NSR, rate63, RBBB, poss old infarct anterolaterally... ~  11/13:  on ASA81, Amlod2.5, Lisin5, Demadex20-1/2, Diamox250, & K20/d; BP=122/64 & she is feeling better,  still w/ mult somatic complaints... ~  12/13:  BP= 124/72 & her edema, weight, etc are about stable... ~  1/14:  BP= 118/60 & she persists w/ mult somatic complaints... ~  4/14:  on ASA81, Amlod2.5, Lisin5, Demadex20-1/2, Diamox250, & K10-3/d + KPhos-1Bid; BP=100/62 & she is feeling sl weak w/ mult somatic complaints; labs 4/14 show K=3.7, Phos=3.4, Cr=1.1; we decided to STOP the Amlodipine & work on sodium restriction ~  6/14: on ASA81, Lisiin5, Demadex20, Diamox250, & K10-3/d + KPhos-1Bid; BP= 110/68 & 3+edema persists; Labs- OK & we decided to STOP the Lisinopril & INCR the Demadex20-2Qam.... ~  7/14: on  ASA81, Demadex20-2/d, Diamox250, & K10-3/d + KPhos-1Bid; BP= 120/62 & she is improved w/ decr edema... ~  9/14:  BP remains well controlled on diuretics alone & measures 116/64 today.. ~  11/14: BP is controlled on ASA81, Demadex20-2Qam, Diamox250, & K10-3/d + KPhos-1Bid (off prev Amlod & Lisin); BP=122/70 & she denies angina pain, palpit, ch in SOB etc; K is sl low at 3.2 7 rec to incr KCl10 to 2Bid. ~  3/15: BP= 106/64 on Demadex20Qam &  Diamox250Qpm w/ K10-2Bid and KPhos1Bid... ~  6/15:  on ASA81, Demadex20-2Qam, Diamox250-at 4pm, & K10-2Bid + KPhos-1Bid; BP=100/60 & labs 6/15 show K=4.0, Phos=2.5, Cr=1.1; continue same meds for now. ~  8/15: on ASA81, Demadex20-2Qam, Diamox250 at 4pm & K10-2/d;  BP= 120/62, continue same... ~  9/15: she was [facility] by Cards (Adm 9/14 - 09/02/14))w/ MI, cardiomyopathy w/ combined sys & diast CHF, & she refused invasive procedures/ cath etc; disch to [facility] rehab, then back home on ASA81, Plavix75, MetopER12.5, Lisin5, KPhos-Bid, & off all diuretics...  ~  2DEcho 9/15 showed mild LVH, reduced LVF w/ EF=45-50% w/ HK in apex & septum, Gr1DD, mild AS/AI, mildly thickened MV leaflets & mild LAdil, PAsys=40... ~  10/15: ret to office weaker w/ 19# wt gain & 4+edema off her diuretics w/ BP= 92/64, wt up 19# w/ 4+edema=> she was re-hosp w/ pulm edema (Adm 10/9 -  09/29/14), BNP=9280, given IV Lasix & med adjustment- disch on ASA81, Plavix75, Lasix40, Lisin5, KPos-Bid=> now w/ decr edema & BP=124/62 but f/u labs showed persist bilat pleural effusions, K=3.1, HCO3=42, Cr=1.1, BNP=485... REC to continue current meds, add Diamox250 at 4PM daily, add KCl-16mq caps 2/d but this wasn't done by the NH... ~  11/15: She has lost another 5# & breathing is stable> CXR w/ persistent sm bilat effusions & ?fluid in fissure; Labs showed K=2.7, HCO3=40, Cr=1.1, & BNP=311; we decided to add the Diamox250 & K20Bid ~  12/15: it is apparent that she is not taking any of her meds since disch from the NH; daugh is clueless & son (who does her meds), doesn't know what they are or what they are for; [facility] confirms not refilling Lasix, Diamox, others. We discussed having visiting nurse supervision of meds & I talked to GIran.. ~  1/16: she is here w/ her son who is still doing her meds he says> they brought med bottles and [facility] confirms taking Lisin5, Lasix40, Diamox250, KCl20Bid, KPhosBid; unfortunately her wt is the same at 114# & her edema remains 3-4+ in LEs; we will recheck labs & see if we can incr the Lasix dose=> Labs ok therefore incr Lasix40-2Qam. ~  2/16: on ASA81, Plavix75, Lisin5, Lasix40-2Qam, Diamox250Qpm, KCl20Bid & KPhosBid; edema about the same ~3+ & weight= 113# today; Rec to take meds regularly every day, no salt, support hose, elev legs etc...  VENOUS INSUFFICIENCY, CHRONIC & EDEMA - on low sodium diet, elevation, support hose- prev refused diuretic meds due to urinary symptoms, [facility]... subseq gained 20# w/ 4+ pedal edema & re-hosp 10/15 w/ IV lasix & disch on oral Lasix but it is apparent that she did not fill the Rx & wasn't taking her meds... ~  1/16:  She is back on Lisin5, Lasix40, Diamox250, KCl20Bid, KPhosBid=> but she has not lost any wt or any of her pedal edema=> K & PO4 are wnl, Cr=1.14 therefore incr  Lsaix40 to 2Qam.  HYPERCHOLESTEROLEMIA (ICD-272.0) - prev on Lip10, but she stopped this on her own... she has a very high HDL!!! ~  FLP was 1/08 showed TChol 253, TG 125, HDL 121, LDL 93 ~  FLP 2/09 showed TChol 200, TG 99, HDL 136, LDL 44 ~  FLP 4/11 showed TChol 244, TG 143, HDL 149, LDL 59 ~  FLP 1/12 showed TChol 266, TG 102, HDL 151, LDL 83 >> BEST HDL I'VE EVER SEEN ~  It has been hard to get her back in for FASTING  labs... ~  FLP 9/15 in [place] on diet alone showed TChol 222, TG 61, HDL 159, LDL 51  HYPERCALCEMIA >> routine labs w/ Calcium levels betw 10.2 & 11.7 over the last 34yr... ~  Labs 7/13 showed Ca= 11.8 &  PTH= 81... For now avoid calc supplements etc... ~  Labs 9/13 showed Ca= 11.0 & she is reminded- no calcium, no VitD... ~  Labs 10/13 Hosp reviewed> Ca was up, Phos was low, K was low> all improved w/ Rx in hosp (they did not repeat PTH level). ~  4/14:   assoc w/ low phos- c/w primary hyperparathyroidism- she is 81y/o & not felt to be an operative candidate; she was started on KPhos 10[place] w/ improvement in serum Phos to 3.6; serum Ca = 11.2 now but PTH is not rising.  ~  Labs 11/14 showed Ca=10.3 ~  Labs 1/15 showed Ca= 10.5 ~  6/15:  Labs showed  Ca=11.2, PO4=2.5, PTH=108 c/w primary hyperparathyroidism; we are following... ~  Labs 8/15 improved w/ Ca= 10.4, Phos= 2.9  GERD (ICD-530.81) - last EGD was 9/05 by DrPerry showing GERD and esoph stricture- dilated... she stopped her Nexium but states that her swallowing is good & she uses PRILOSEC OTC as needed.  DIVERTICULOSIS OF COLON (ICD-562.10) - last colonoscopy was 11/00 showing divertics only... ~  8/12:  She notes some constipation & rec [place]...  HX, URINARY INFECTION (ICD-V13.02) & INCONTINENCE symptoms... she has seen DrTannenbaum & staff on bladder exercises and she reported some improvement but has persistant symptoms & never followed up... ~   7/10:  offered f/u appt w/ Urology vs second opinion consult but she declines... ~  9/10:  wrote for trial Enablex 7.5439md >> no benefit she says. ~  12/10: saw DrNesi- tried sample med, sl improved, but "he turned me over to a lady for longer term treatments" she says... ~  4/12 & 6/12: states DrNesi hasn't been able to help her & she wants appt w/ DrTannenbaum==> seen w/ urodynamic eval revealing a sm hypersens bladder & a large bladder divertic; not a surg candidate, taught double voiding technique... ~  10/12: she had f/u DrTannenbaum & he reinforced prev w/u & need for double voiding technique... ~  10/13: In hosp Urine grew mult species, Rx w/ Roceph=> Cipro...  DEGENERATIVE JOINT DISEASE (ICD-715.90) - notes right shoulder symptoms, but managing OK w/ Mobic 7.39m68m Tylenol; also right hip & low back pain...  she also c/o neuropathic "burning" in legs but not bad enough for additional meds she says... ~  8/14: we [place] a [place] from [place] note reviewed[place] pt c/o bilat foot & ankle pain & burning w/ numbness & tingling; Dx w/ somatic dysfunction in lumbar region; subluxations seen on XRays; given an adjustment... ~  9/14: symptoms improved on Gabapentin 100m14md she says...  OSTEOPOROSIS (ICD-733.00) - she was on Fosamax but had discontinued this on her own... we discussed the need for continued therapy and she was asked to restart Alendronate but she never did...  she takes Ca++, MVI, VitD==> rec to STOP the Calcium.  SENILE DEMENTIA (ICD-290.0) - she takes ASA 81mg839m. she tried Aricept in the past but didn't notice any improvement therefore stopped it. ~  she & her husb still live on their own but are having numerous difficulties while still resisting any thoughts of assisted living etc> got concerned because she saw  an impulse in her left antecubital fossa (tortuous brachial art), went to the ER & this got translated to palpitations & lead to full ER cardiac eval=  neg... ~  6/11: MMSE showed 26/30 & she agreed to try DONEPEZIL 30m/d... ~  6/12:  Still taking Aricept & appears stable... ~  7/13:  No change, same meds, clinically stable... ~  7/14:  She remains stable and will be 81y/o in Oct... ~  3/15:  She remains on Aricept5Bid- she prefers it this way & won't change...  ANXIETY (ICD-300.00) - she uses Alpraz 0.247mas needed.  Hx of SHINGLES (ICD-053.9)   Past Surgical History  Procedure Laterality Date  . Vesicovaginal fistula closure w/ tah    . Tonsillectomy    . Abdominal hysterectomy      Outpatient Encounter Prescriptions as of 02/07/2015  Medication Sig  . acetaZOLAMIDE (DIAMOX) 250 MG tablet Take 1 tablet (250 mg total) by mouth daily. At 4 pm  . ALPRAZolam (XANAX) 0.25 MG tablet Take 0.125-0.25 mg by mouth 3 (three) times daily as needed. For nerves  . aspirin EC 81 MG tablet Take 81 mg by mouth daily.  . clopidogrel (PLAVIX) 75 MG tablet TAKE 1 TABLET BY MOUTH EVERY DAY  . donepezil (ARICEPT) 5 MG tablet TAKE 1 TABLET (5 MG TOTAL) BY MOUTH 2 (TWO) TIMES DAILY.  . feeding supplement (ENSURE COMPLETE) LIQD Take 237 mLs by mouth 2 (two) times daily between meals.  . furosemide (LASIX) 40 MG tablet Take 2 tablets by mouth every morning  . gabapentin (NEURONTIN) 100 MG capsule Take 1 capsule (100 mg total) by mouth 3 (three) times daily.  . Marland Kitchenisinopril (PRINIVIL,ZESTRIL) 5 MG tablet TAKE 1 TABLET (5 MG TOTAL) BY MOUTH DAILY.  . Multiple Vitamin (MULTIVITAMIN) tablet Take 1 tablet by mouth daily.    . nitroGLYCERIN (NITROSTAT) 0.4 MG SL tablet Place 1 tablet (0.4 mg total) under the tongue every 5 (five) minutes x 3 doses as needed for chest pain.  . Marland Kitchenxybutynin (DITROPAN) 5 MG tablet Start with 1/2 tablet two times daily  . PHOSPHA 250 NEUTRAL 155-852-130 MG tablet TAKE 1 TABLET BY MOUTH 2 (TWO) TIMES DAILY.  . Marland Kitchenolyethylene glycol (MIRALAX / GLYCOLAX) packet Take 17 g by mouth daily.  . potassium chloride SA (KLOR-CON M20) 20 MEQ  tablet Take 1 tablet (20 mEq total) by mouth 2 (two) times daily.  . [DISCONTINUED] donepezil (ARICEPT) 5 MG tablet Take 5 mg by mouth 2 (two) times daily.  . [DISCONTINUED] phosphorus (K PHOS NEUTRAL) 155-852-130 MG tablet Take 250 mg by mouth 2 (two) times daily.    No Known Allergies   Current Medications, Allergies, Past Medical History, Past Surgical History, Family History, and Social History were reviewed in [employer].    Review of Systems        See HPI - all other systems neg except as noted...      The patient complains of decreased hearing, dyspnea on exertion, peripheral edema, incontinence, muscle weakness, and difficulty walking.  The patient denies anorexia, fever, weight loss, weight gain, vision loss, hoarseness, chest pain, syncope, prolonged cough, headaches, hemoptysis, abdominal pain, melena, hematochezia, severe indigestion/heartburn, hematuria, suspicious skin lesions, transient blindness, depression, unusual weight change, abnormal bleeding, enlarged lymph nodes, and angioedema.     Objective:   Physical Exam      WD, WN, 81 y/o BF in NAD... GENERAL:  Alert, pleasant & cooperative; she is slow moving as noted... HEENT:  [place]/AT, EOM-full, EACs- some wax, TMs-wnl, NOSE-clear, THROAT-clear & wnl. NECK:  Supple w/ fairROM; no JVD; normal carotid impulses w/o bruits; no thyromegaly or nodules palpated; no lymphadenopathy. CHEST:  Clear to P & A; without wheezes/ rales/ or rhonchi., noted kyphosis... HEART:  Regular Rhythm; Gr1/6 SEM, without rubs or gallops heard... ABDOMEN:  Soft & nontender; normal bowel sounds; no organomegaly or masses detected. EXT:  mod arthritic changes; no varicose veins/ +venous insuffic/ 3+edema  NEURO:  weak, no focal neuro deficits... DERM:  No lesions noted; no rash etc...  MMSE> 06/13/10> Score 26/30 (missed place, 2/3 recall after distraction, & copy figure)...   Assessment & Plan:    ASHD, s/p  NSTEMI, combined sys/diast CHF, VI, edema>   [facility] she declined [facility] options, treated medically, but going downhill w/ hypotension and marked incr edema, she will f/u w/ Cards regarding options avail...  =>  MEDS:  ASA81, Plavix75, Lisinopril5, Lasix40Qam, Diamox250Qpm, KCl20Bid, KPhos-1Bid 10/15> we tried to add Diamox250 & K10 but neither were started as requested in the NH... 11/15> she is now home again, we have ordered Diamox250 & K20Bid to be added to her meds=> in follow up it was apparent that she never filled these Rxs. 12/15> Pt is not capable of doing her own meds, son who lives w/ pt & daughter who brings her to [facility] are similarly incapable of providing this supervision, I have talked w/ Jackelyn Poling at [place]. 1/16> she appears to be taking her meds properly at this point but still has 3-4+edema & wt w/o change; Labs improved, therefore incr Lasix to 27mQam... 2/16> stable on meds w/ Lasix80, diamox250, KCl & KPhos; continue same + no salt, elevation, support hose, etc...   AR & ASTHMA>  Breathing is stable, at baseline, she has chr rhinorrhea complaints w/ numerous discussions regarding management of this problem....  Ven Insuffic/ Edema>>  [place], elevation, support hose; she developed marked increase edema off the Demadex/ Diamox after hosp 9/15 and never filled the Lasix Rx after the 10/15 hosp; we are adjusting diuretics again as above...  CHOL>  She has the world's best HDL!!!  Electrolye Abn>  See 10/13 Hosp by [facility], it is apparent that her elev calcium is the result of HYPERPARATHYROIDISM but at age [age]we are reluctant to refer for surg unless progressive... We are following her serum Calcium level & non-progressive. Hypercalcemia>  Prob from Hyperpara & she has stopped the calcium supplements, & we are following the labs...  GU>  She saw DrTannenbaum w/ extensive investigation into her voiding symptoms  & urodynamics as above; rec to use "double voiding" technique... 8/15> still complaining & declines ret to Urology; try Ditropan 565m 1/2 tab Bid...  DJD/ Osteopenia>  Aware, she is stable on OTC meds, she has repeatedly refuse Bisphos meds for her bones...  TIA, Dementia>  On ASA81, Plavix75; She & husb CeLaveda Abbere still living independently w/ daugh help but they are very fragile yet won't consider NHP...  Other medical problems as noted...   Patient's Medications  New Prescriptions   No medications on file  Previous Medications   ACETAZOLAMIDE (DIAMOX) 250 MG TABLET    Take 1 tablet (250 mg total) by mouth daily. At 4 pm   ALPRAZOLAM (XANAX) 0.25 MG TABLET    Take 0.125-0.25 mg by mouth 3 (three) times daily as needed. For nerves   ASPIRIN EC 81 MG TABLET  Take 81 mg by mouth daily.   CLOPIDOGREL (PLAVIX) 75 MG TABLET    TAKE 1 TABLET BY MOUTH EVERY DAY   DONEPEZIL (ARICEPT) 5 MG TABLET    TAKE 1 TABLET (5 MG TOTAL) BY MOUTH 2 (TWO) TIMES DAILY.   FEEDING SUPPLEMENT (ENSURE COMPLETE) LIQD    Take 237 mLs by mouth 2 (two) times daily between meals.   FUROSEMIDE (LASIX) 40 MG TABLET    Take 2 tablets by mouth every morning   GABAPENTIN (NEURONTIN) 100 MG CAPSULE    Take 1 capsule (100 mg total) by mouth 3 (three) times daily.   LISINOPRIL (PRINIVIL,ZESTRIL) 5 MG TABLET    TAKE 1 TABLET (5 MG TOTAL) BY MOUTH DAILY.   MULTIPLE VITAMIN (MULTIVITAMIN) TABLET    Take 1 tablet by mouth daily.     NITROGLYCERIN (NITROSTAT) 0.4 MG SL TABLET    Place 1 tablet (0.4 mg total) under the tongue every 5 (five) minutes x 3 doses as needed for chest pain.   OXYBUTYNIN (DITROPAN) 5 MG TABLET    Start with 1/2 tablet two times daily   PHOSPHA 250 NEUTRAL 155-852-130 MG TABLET    TAKE 1 TABLET BY MOUTH 2 (TWO) TIMES DAILY.   POLYETHYLENE GLYCOL (MIRALAX / GLYCOLAX) PACKET    Take 17 g by mouth daily.   POTASSIUM CHLORIDE SA (KLOR-CON M20) 20 MEQ TABLET    Take 1 tablet (20 mEq total) by mouth 2 (two) times  daily.  Modified Medications   No medications on file  Discontinued Medications   DONEPEZIL (ARICEPT) 5 MG TABLET    Take 5 mg by mouth 2 (two) times daily.   PHOSPHORUS (K PHOS NEUTRAL) 155-852-130 MG TABLET    Take 250 mg by mouth 2 (two) times daily.

## 2015-02-07 NOTE — Patient Instructions (Signed)
Today we updated your med list in our EPIC system...    Continue your current medications the same...  For your swelling & fluid retention>>    No salt/ sodium in diet...    Continue your diuretic therapy the same for now...    Wear the support hose & keep your legs elevated...  For your constipation>>    Take the MIRALAX in Prune juice daily...  For the chapped lips>>     Use 'chap stck" or vaseline regularly   Call for any questions...  Let's plan a follow up visit in 6 weeks w/ CXR & blood work at that time.Marland KitchenMarland Kitchen

## 2015-02-08 ENCOUNTER — Telehealth: Payer: Self-pay | Admitting: Pulmonary Disease

## 2015-02-08 NOTE — Telephone Encounter (Signed)
Per SN---  Ok to send in the order needed for the pt.  i have called and lmomtcb for cora from [facility].

## 2015-02-08 NOTE — Telephone Encounter (Signed)
Spoke with Cora with [facility]. States that they are needing an order for Social Work - patient's last visit with the Skilled Nursing Facility will be on 3/3 or 3/4, she will then need social work to take over her care. Ivor Messier also states that they need an order to change her D/C date, pt is due to to be D/C on 02/22/15 which is a Sunday, this date will need to be moved to either 02/16/15 or 02/17/15.  Please advise Dr Kriste Basque. Thanks.

## 2015-02-09 NOTE — Telephone Encounter (Signed)
lmtcb for Jessica [name].

## 2015-02-09 NOTE — Telephone Encounter (Signed)
[phone], Jessica [name]

## 2015-02-09 NOTE — Telephone Encounter (Signed)
lmomtcb  

## 2015-02-09 NOTE — Telephone Encounter (Signed)
Spoke with Ivor Messier and gave VO for social work  Nothing further needed

## 2015-02-13 ENCOUNTER — Telehealth: Payer: Self-pay | Admitting: Pulmonary Disease

## 2015-02-13 NOTE — Telephone Encounter (Signed)
Spoke with pt's son Jessica [name], states pt is having difficulty getting donepezil filled.  States it isn't covered by insurance.  [facility] [address]    Dr. Kriste Basque, do you have an alternative med to replace donepezil, or does a PA need to be done for this med?  Thanks!  No Known Allergies Current Outpatient Prescriptions on File Prior to Visit  Medication Sig Dispense Refill  . acetaZOLAMIDE (DIAMOX) 250 MG tablet Take 1 tablet (250 mg total) by mouth daily. At 4 pm 30 tablet 6  . ALPRAZolam (XANAX) 0.25 MG tablet Take 0.125-0.25 mg by mouth 3 (three) times daily as needed. For nerves    . aspirin EC 81 MG tablet Take 81 mg by mouth daily.    . clopidogrel (PLAVIX) 75 MG tablet TAKE 1 TABLET BY MOUTH EVERY DAY 30 tablet 0  . donepezil (ARICEPT) 5 MG tablet TAKE 1 TABLET (5 MG TOTAL) BY MOUTH 2 (TWO) TIMES DAILY. 180 tablet 1  . feeding supplement (ENSURE COMPLETE) LIQD Take 237 mLs by mouth 2 (two) times daily between meals. 60 Bottle 0  . furosemide (LASIX) 40 MG tablet Take 2 tablets by mouth every morning 60 tablet 1  . gabapentin (NEURONTIN) 100 MG capsule Take 1 capsule (100 mg total) by mouth 3 (three) times daily. 90 capsule 1  . lisinopril (PRINIVIL,ZESTRIL) 5 MG tablet TAKE 1 TABLET (5 MG TOTAL) BY MOUTH DAILY. 30 tablet 5  . Multiple Vitamin (MULTIVITAMIN) tablet Take 1 tablet by mouth daily.      . nitroGLYCERIN (NITROSTAT) 0.4 MG SL tablet Place 1 tablet (0.4 mg total) under the tongue every 5 (five) minutes x 3 doses as needed for chest pain. 25 tablet 1  . oxybutynin (DITROPAN) 5 MG tablet Start with 1/2 tablet two times daily 60 tablet 6  . PHOSPHA 250 NEUTRAL 155-852-130 MG tablet TAKE 1 TABLET BY MOUTH 2 (TWO) TIMES DAILY. 180 tablet 1  . polyethylene glycol (MIRALAX / GLYCOLAX) packet Take 17 g by mouth daily.    . potassium chloride SA (KLOR-CON M20) 20 MEQ tablet Take 1 tablet (20 mEq total) by mouth 2 (two) times daily. 60 tablet 6   No current  facility-administered medications on file prior to visit.

## 2015-02-14 ENCOUNTER — Other Ambulatory Visit: Payer: Self-pay | Admitting: Pulmonary Disease

## 2015-02-14 MED ORDER — DONEPEZIL HCL 10 MG PO TABS: 10.0000 mg | ORAL_TABLET | Freq: Every day | ORAL | Status: DC

## 2015-02-14 NOTE — Telephone Encounter (Signed)
Per SN :  If insurance will not cover Aricept 5mg  BID Rx then can change to Aricept 10mg  QD. Pt can either take 10mg  tablet or cut them in half and take her original 5mg  BID dose.  ------- Called and attempted to explain to patient, was unsuccessful, asked that the patient have her Son Jessica [name] call us back to that we may explain to him about her medication. Will await call back

## 2015-02-14 NOTE — Telephone Encounter (Signed)
[phone], pt son Tiburcio Bash cb

## 2015-02-14 NOTE — Telephone Encounter (Signed)
Pt's son is aware of medication. Rx has been sent in. Nothing further was needed.

## 2015-03-11 ENCOUNTER — Other Ambulatory Visit: Payer: Self-pay | Admitting: Pulmonary Disease

## 2015-03-17 ENCOUNTER — Other Ambulatory Visit: Payer: Self-pay | Admitting: Pulmonary Disease

## 2015-03-21 ENCOUNTER — Ambulatory Visit (INDEPENDENT_AMBULATORY_CARE_PROVIDER_SITE_OTHER): Payer: Medicare PPO | Admitting: Pulmonary Disease

## 2015-03-21 ENCOUNTER — Encounter: Payer: Self-pay | Admitting: Pulmonary Disease

## 2015-03-21 ENCOUNTER — Encounter (INDEPENDENT_AMBULATORY_CARE_PROVIDER_SITE_OTHER): Payer: Self-pay

## 2015-03-21 ENCOUNTER — Other Ambulatory Visit (INDEPENDENT_AMBULATORY_CARE_PROVIDER_SITE_OTHER): Payer: Medicare PPO

## 2015-03-21 VITALS — BP 128/78 | HR 49 | Temp 98.1°F | Ht 65.0 in | Wt 110.8 lb

## 2015-03-21 DIAGNOSIS — I4510 Unspecified right bundle-branch block: Secondary | ICD-10-CM | POA: Diagnosis not present

## 2015-03-21 DIAGNOSIS — R609 Edema, unspecified: Secondary | ICD-10-CM

## 2015-03-21 DIAGNOSIS — I2510 Atherosclerotic heart disease of native coronary artery without angina pectoris: Secondary | ICD-10-CM

## 2015-03-21 DIAGNOSIS — R269 Unspecified abnormalities of gait and mobility: Secondary | ICD-10-CM

## 2015-03-21 DIAGNOSIS — I872 Venous insufficiency (chronic) (peripheral): Secondary | ICD-10-CM

## 2015-03-21 DIAGNOSIS — I5042 Chronic combined systolic (congestive) and diastolic (congestive) heart failure: Secondary | ICD-10-CM

## 2015-03-21 DIAGNOSIS — F0390 Unspecified dementia without behavioral disturbance: Secondary | ICD-10-CM

## 2015-03-21 DIAGNOSIS — R531 Weakness: Secondary | ICD-10-CM

## 2015-03-21 LAB — CBC WITH DIFFERENTIAL/PLATELET
BASOS ABS: 0 10*3/uL (ref 0.0–0.1)
Basophils Relative: 0.5 % (ref 0.0–3.0)
Eosinophils Absolute: 0.1 10*3/uL (ref 0.0–0.7)
Eosinophils Relative: 1.9 % (ref 0.0–5.0)
HCT: 31.8 % — ABNORMAL LOW (ref 36.0–46.0)
Hemoglobin: 10.3 g/dL — ABNORMAL LOW (ref 12.0–15.0)
LYMPHS PCT: 15.4 % (ref 12.0–46.0)
Lymphs Abs: 0.8 10*3/uL (ref 0.7–4.0)
MCHC: 32.3 g/dL (ref 30.0–36.0)
MCV: 80.7 fl (ref 78.0–100.0)
MONOS PCT: 12 % (ref 3.0–12.0)
Monocytes Absolute: 0.6 10*3/uL (ref 0.1–1.0)
NEUTROS PCT: 70.2 % (ref 43.0–77.0)
Neutro Abs: 3.5 10*3/uL (ref 1.4–7.7)
PLATELETS: 251 10*3/uL (ref 150.0–400.0)
RBC: 3.94 Mil/uL (ref 3.87–5.11)
RDW: 18.4 % — ABNORMAL HIGH (ref 11.5–15.5)
WBC: 4.9 10*3/uL (ref 4.0–10.5)

## 2015-03-21 LAB — PHOSPHORUS: PHOSPHORUS: 3.2 mg/dL (ref 2.3–4.6)

## 2015-03-21 LAB — BASIC METABOLIC PANEL
BUN: 18 mg/dL (ref 6–23)
CO2: 32 mEq/L (ref 19–32)
CREATININE: 1.31 mg/dL — AB (ref 0.40–1.20)
Calcium: 10.8 mg/dL — ABNORMAL HIGH (ref 8.4–10.5)
Chloride: 104 mEq/L (ref 96–112)
GFR: 47.87 mL/min — AB (ref >60.00)
Glucose, Bld: 73 mg/dL (ref 70–99)
POTASSIUM: 3.8 meq/L (ref 3.5–5.1)
Sodium: 140 mEq/L (ref 135–145)

## 2015-03-21 NOTE — Patient Instructions (Signed)
Today we updated your med list in our EPIC system...    Continue your current medications the same...  Today we did your follow up blood work...    We will contact you w/ the results when available...   Keep up the good work w/ your home care...  Call for any questions...  Let's plan a follow up visit in 32mo, sooner if needed for problems.Marland KitchenMarland Kitchen

## 2015-03-21 NOTE — Progress Notes (Signed)
Subjective:    Patient ID: Jessica [name], female    DOB: [birth year], 81 y.o.   MRN: [id number]  HPI 81 y/o BF here for a follow up visit...  She has mult medical problems including:  AR & runny nose;  Asthmatic Bronchitis;  HBP;  Chronic VI & edema;  Hyperchol;  GERD & esoph stricture;  Divertics;  Hx Urinary incontinence & UTIs;  DJD/ Osteoporosis;  Semile Dementia;  Anxiety...   ~  SEE PREV EPIC NOTES FOR EARLIER DATA >>   LABS 4/14:  Chems- Ca=11.2 but PTH is sl lower than prev at 86;  Phos=3.4 on KPhos1tabBid; K= 3.7 on this & K10- 2AM+1PM;  LFTs= wnl and Alb= 4.0 on Ensure...  Labs 7/14:  Chems- wnl x Ca=10.8, continue same meds...  LABS 11/14:  Chems- ok x K=3.2, BUN=24, Cr=1.1;  CBC w/ Hg=11.8;  TSH=2.48...  ~  March 15, 2014:  45moROV & Jessica [name] has the same chr complaints- generally stable, notes leaking bladder (prev eval DrTannenbaum), burning feet (saw Podiatry-DrRegal), occas indigestion... She [facility] & now she wants ENT referral to check her ears for lavage...     She notes that her breathing is fine- no recent resp exac, cough, sputum, ch in SOB, etc...    BP= 106/64 on Demadex20Qam & Diamox250Qpm w/ K10-2Bid and KPhos1Bid; see labs below...    She ambulates w/ cane & walker, denies CP/ palpit/ ch in edema...     She notes some urinary incont & wears depends, no burning etc...    She remains on Aricept5Bid- she prefers it this way & won't change... We reviewed prob list, meds, xrays and labs> see below for updates >> she did not bring med list or med bottles & reminded to bring bottles to every OV...   LABS 1/15:  Chems- ok w/ K=3.5, Cr=1.2  ~  May 20, 2014:  224moOV & Jessica [name] is stable, same chr complaints which she like to discuss Q2m67mo     HBP> on ASA81, Demadex20-2Qam, Diamox250-at 4pm, & K10-2Bid + KPhos-1Bid; BP=100/60 & labs 6/15 show K=4.0, Phos=2.5, Cr=1.1; continue same meds for now...    VI, edema> she  knows to elim sodium, elev legs, wear support hose, take Demadex & Diamox as directed...    Chol> on diet alone; she has a very hi HDL ~150 when measured in 2012...    Hypercalcemia> assoc w/ low phos- c/w primary hyperparathyroidism- she is 81y/o & not felt to be an operative candidate; we are following Ca, PO4, PTH levels...     GI- GERD, Divertics> off prev Prilosec20, Miralax, Senakot-S; advised these all as needed...    GU> saw DrTannenbaum for Urology 10/12- mult complaints, urodynamics done w/ sm hypersens bladder unstable w/ leakage, & large bladder divertic> he rec double voiding technique...    DJD, Osteopenia> off Ca supplements, on KPhos-2Bid, on MVI & encouraged to walk as much as poss...    Senile Dementia> on Aricept10 & Alpraz0.25 as needed... We reviewed prob list, meds, xrays and labs> see below for updates >>   LABS 6/15:  Chems- ok x Ca=11.2, PO4=2.5, PTH=108 c/w primary hyperpara;  CBC- Hg=12.3;  TSH=2.09...   ~  July 20, 2014:  22mo35mo & Jessica [name] stable overall- states that she has been doing fairly well but has a list of questions and wants me to re-order visiting nurses to check on her & husb every week or  so (I explained that we will re-order a home assessment by [facility] etc & see what she is eligible for under Medicare)...     1) she wants a nasal spray for drippy nose> try ASTELIN 1-2 sp in each nostril Bid as needed...    2) she is c/o burning discomfort in legs & we reviewed her Neuropathy Dx & the reason for the Gabapentin Rx- she is only taking 185m Bid & gets some benefit from this she says; offered to incr dose but she can't remember to take it Tid 7 dosen't want to incr to 2Bid so we decided to keep it the same for now...    3) she is c/o urinary symptoms- leaking, some incont, denies burning/ odor/ pain/ blood etc; we discussed checking UA & C&S; we reviewed prev evals from DrNesi & DrTannenbaum- offered ROV w/ these providers but she declined; finally agreed to  try Ditropan555m 1/2tab bid for her symptoms and let me know if she is better...    4) she wants f/u labs and is due for BMet & Phos level (f/u hyperpara on observation)...  We reviewed prob list, meds, xrays and labs> see below for updates >> she brought med bottles and we reviewed her Rx today...  LABS 8/15:  Chems- ok x K=3.1 and Cr=1.2;  Phos=2.9;  UA= clear...   ~  September 20, 2014:  3m30moV & post hosp visit> Jessica [name] Adm by Cards 9/14 - 09/02/14 w/ SOB, she denied CP, EKG was unchanged but Troponins were elev, D-dimer was also abn & a CTAngioChest was done- neg for PE, and VenDopplers were neg for DVT; she was treated w/ Heparin transiently;  2DEcho showed decr LVF w/ EF=45-50% w/ areas of HK, & Gr1DD w/ mild AS/AI- felt to have prob anteroseptal MI w/ distal LAD lesion likely but she refused invasive eval & was made a NCB/ DNR/ med rx only;  Meds were adjusted w/ Hep=> Plavix added, BBlocker continued, and ACE added; [facility]  [facility] [place] [facility] [place]...  She was disch home 10/2 w/ home healt PT/ OT/ CNA/ Nursing/ and Social worker all arranged...  After disch she returned to the ER 10/3 w/ paresthesias and weakness> seen by ER staff & Neuro consultant; BP was recorded at 180-190 sys, CT Brain & MRI were neg for acute infarct & she was felt to have had a TIA- already on Plavix & w/ nothing further to offer she was sent home...  She comes in today w/ daugh & appears weaker, BP is low at 92/64, marked increase in leg edema since off her diuretics (stopped during recent hosp w/ her NSTEMI), going downhill but ever stoic she says she is doing satis at home w/ BSC, walker, etc; they truly love the home health care assist w/ Pt/ OT/ HHN/ aides/ & social worker; they again decline to consider NHP for pt & her husband...  We reviewed the following medical problems during today's  office visit >>     HBP> prev on Demadex20-2Qam, Diamox250-at 4pm, & K10-2Bid + KPhos-1Bid; but now off all diuretics on MetopER25-1/2 daily, Lisinopril5 & KPhos-1Bid; BP=92/64 & no wiggle room to add back diuretics=> she will f/u w/ Cards soon...     ASHD, s/p NSTEMI, ischemic cardiomyopathy, combined sys & diast CHF> Adm 9/15 by Cards w/ NSTEMI & combined sys/diast  CHF; she declined intervention; medical therapy adjusted- off diuretics, added MetopER12.5/ Lisin5/ ASA81/ Plavix75, but edema incr.    VI, edema> off diuretics now per Cards adjustment 9/15; she knows to elim sodium, elev legs, wear support hose; she has f/u appt w/ Cards soon...    Chol> on diet alone; she has a very hi HDL ~150 when measured in 2012...    Hypercalcemia> assoc w/ low phos- c/w primary hyperparathyroidism- she is 81y/o & not felt to be an operative candidate; we are following Ca, PO4, PTH levels...     GI- GERD, Divertics> off prev Prilosec20, Miralax, Senakot-S; advised these all as needed...    GU> saw DrTannenbaum for Urology 10/12- mult complaints, urodynamics done w/ sm hypersens bladder unstable w/ leakage, & large bladder divertic> he rec double voiding technique & Ditropan5-1/2Bid...    ?TIA, Neuropathy> on Neurontin100Tid; she went to the ER 10/15 one day after disch from the NH w/ numbness=> NEG CTBrain & MRI w/o acute changes, symptoms passed & sent home on same ASA81 + Plavix75/d...    DJD, Osteopenia> off Ca supplements, on KPhos-1Bid, on MVI & encouraged to walk as much as poss...    Senile Dementia> on Aricept5 & Alpraz0.25 as needed... We reviewed prob list, meds, xrays and labs> see below for updates >>   LABS 10/15:  FLP- ok on diet alone w/ HDL=159!  Chems- ok w/ Cr=1.01, K=4.1, BS=104, A1c=6.0, Ca=10.8;  CBC- Hg=11.7;  TSH=2.48...  CXR 9/15 showed mild cardiomeg, clear lungs, poor inspiration, osteopenia, scoliosis w/o change, NAD.Marland KitchenMarland Kitchen  EKG 9/15 showed SBrady, rate47, PACs, RBBB, left axis deviation,  NSSTTWA...   2DEcho 9/15 showed mild LVH, reduced LVF w/ EF=45-50% w/ HK in apex & septum, Gr1DD, mild AS/AI, mildly thickened MV leaflets & mild LAdil, PAsys=40...  CT Angio Chest 9/15 showed neg for PE, mildly enlarged cardiac chambers, Ao looked ok, COPD/E bilat w/ atx vs scarring at bases, scoliosis & DDD, ?nonobstructing stone in mid right kidney...  CT Head 10/15 showed mild diffuse atrophy, no acute changes...  MRI Head 10/15 revealed no infarct, hemorrhage, or mass; mild atrophy & sm vessel dis, NAD...  PLAN>> she has gained 19# since last here w/ marked edema (4+bilat in legs) since she's been off her diuretics; however BP is low at 92/64 on her low dose Metop & Lisin per Cards; advised no salt/ elev legs/ support hose/ & f/u w/ Cards ASAP as planned (not many options here- perhaps stop BBlocker, add diuretic, consider pacer);  She is happy w/ all the home health support...  ~  October 06, 2014:  2wk ROV & post-hosp visit> after last OV she was hosp by [facility] from 10/9 - 09/29/14 w/ incr SOB (O2sat=84% on RA), edema (BNP=9280, CXR w/ pulm edema), and weakness; but no CP or other symptoms and troponins were neg;  She was diuresed and her meds were adjusted (Metoprolol stopped due to low heart rate); disch to NHP for rehab and she is loving the attention "they are working with me"- on Lasix40 & KPhos bid, plus her ASA/ Plavix/ Lisin5...  Her CC is constipation> going every 3-4 days & firm, no blood reported; she is rec to start a regimen w/ Miralax daily & Senakot-S 2Qhs... We discussed rechecking her CXR (improved aeration) and LABS (K=3.1, HCO3=42, Cr=1.1, BNP=485) today... REC to continue Lasix40, Add back the Diamox250/d & the KCl- 31mq caps 2/d as before... ROV in 2-3 weeks...     We reviewed prob list, meds, xrays and  labs> see below for updates >>   CXR 10/15 Hosp showed cardiomeg, engorged pulm vasculature, increased markings, can't r/o effusions, all c/w pulm edema...   CXR  10/15 office f/u showed cardiomeg, improved aeration, persist bilat effusions...  LABS 10/15 office f/u>  Chems- K=3.1, HCO3=42, BUN=16, Cr=1.1, Ca=10.4, BNP=485...   ~  October 25, 2014:  3wk ROV & Jessica [name] is still in [employer] & to be released home in 3d her daughter says; advised to talk to [facility] staff about max home health support w/ visiting nurses, PT/ OT, etc; Phys therapy has checked her & the home situation & they feel she is safe for DC home; Daughter needs schooling on pt's home care needs, feeding, use of "thick-it", and wants to know "how to keep the pill box filled"...  Jessica [name] had [facility] f/u 10/22 w/ PA-Luke & they felt she was doing OK, no changes made, f/u 73mo  I note that the Diamox & KCl we tried to add after her last OV here is not on her list- ?what happened? Since she was here last she has lost another 5# but still has 2+edema in LEs, breathing is OK, she is avoiding salt she says;    Hx HBP, ASHD, s/pNSTEMI, ischemic cardiomyop (EF=45-50% w/ HK), combined sys&diast CHF, etc> on ASA81, Plavix75, Lisin5, Lasix40, KPhos2/d; Prev ordered Diamox250 & K10 are not on her list; BP= 122/64 & she denies ch in SOB, edema (2+), etc;  Labs w/ K=2.7, HCO3=40, Cr=1.1, BNP=311;  CXR w/ sm bilat effusions persist;  Plan is to add the Diamox250 one daily at 4PM, & KCl264mBid, continue other meds the same...     On Miralax, Senakot, Ensure> not taking PPI now; Hx GERD, Divertics; needs thick-it for her fluids per speech path & daughter must be taught...    On Ditropan5-1/2Bid for bladder...    On Neurontin100Tid for neuropathy...    On Aricept5 for memory & Alprazolam 0.25 prn nerves...  We reviewed prob list, meds, xrays and labs> see below for updates >> we reconciled med lists   CXR 11/15 showed small bilat effusions and rounded area ?fluid in fissure) on left lat chest...  LABS 11/15:  Chems- K=2.7, HCO3=40, Cr=1.1;  BNP=311... PLAN>>  Continue meds, we started KCl 2040mBid and added  DIAMOX250m22me tab at 4PM daily... ROV in 3 wks.  ~  November 28, 2014:  84mo 63mo& recheck> despite all efforts- conversation w/ daughter who brings pt to office/ son who lives w/ pt/ [facility] nurses from [facility] not taking the prescribed meds properly, [facility]'t [facility] Lasix, Diamox, or the KCl!...   I discussed this w/ the daughter but she doesn't understand & called her brother, I talked to him on the phone and he doesn't know the meds or have interest in providing this service for his mother;  Finally I called [facility] [phone] to [place] them to increase services to MrsSolomon to whatever level is needed to provide the needed support 7 total supervision of her meds etc...    CV meds> ASA81, Plavix75, Lisinopril5, Lasix40Qam, Diamox250Qpm, KCl20Bid, KPhos-1Bid    Neuro meds> Aricept5Bid, Neurontin100Tid, Xanax0.25Tid prn    Others> Ditropan5-1/2Bid, Miralax daily, MVI, Ensure  ~  December 29, 2014:  84mo R78mo recheck> Zarianna [name] w/ her son (first time I have met him) who does her meds; [facility] coming out but apparently NOT supervising her meds (no  notes or calls from them)- still done by son,placing pills in a weekly med box & checking to be sure she is taking it properly; they brought all her med bottles today- [facility] [employer] & prev filled 12/14 (date of last OV); unfortunately she has not lost any edema (still 3-4+ in both feet/ lower legs and her wt is the same as last month ~114#.  She feels the same- denies CP, palpit, SOB, dizzy, syncope, etc; remains weak, in wheelchair at 81y/o, & VS are stable w/ BP=100/52, pulse= 60/min, O2sat=94% on RA; Chest has diminished BS at bases bilat but no wheezing, rales, rhonchi; Cardiac exam unchanged w/ Gr1/6 SEM no rubs or gallops apprec...     We reviewed prob list, meds, xrays and labs> see below for updates >>   LABS 1/16:  Chems- ok w/ K=3.9,  BS=100, Cr=1.14, Po4=2.9.Marland KitchenMarland Kitchen  PLAN>> we are checking her metabolic labs to see if we can safely increase her Lasix dose to affect a better diuresis; in the meanwhile she is reminded to elim salt/ sodium for her diet, elev legs, wear support hose; given 2gm Na diet sheet; we plan ROV recheck in 22mo.. => Chems OK therefore increase Lasix4547m 2tabsQam.  ~  February 07, 2015:  47m65moV & she is here w/ her son> they assure me that she is taking her meds properly as outlined in her med list... She states that she is doing satis, stable, and she denies SOB; still wheelchair bound, difficulty getting up & difficulty w/ personal care needs; she feels that the swelling in feet/legs is about the same & weight is 113# down 1# today...    She saw DrBerry for Cards 1/16> HBP, ASHD, s/pNSTEMI, ischemic cardiomyop w/ combined sys&diast CHF, etc> 2DEcho 9/15 showing mild LVH, reduced LVF w/ EF=45-50% w/ HK in apex & septum, Gr1DD, mild AS/AI, mildly thickened MV leaflets & mild LAdil, PAsys=40;  On ASA81, Plavix75, Lisin5, Lasix40-2Qam, Diamox250Qpm, KClHalburEdema looks about the same at 3+ in feet & lower legs, exam otherw unchanged and they did not add or change any meds, f/u planned 76mo22mo   We reviewed prob list, meds, xrays and labs> see below for updates >>  PLAN>>  We decided to continue same meds for now; reminded- NO SALT/ SODIUM in diet;  Wear support hose & keep legs up;  ROV in 6 weeks w/ CXR & lab work planned...  ~  March 21, 2015:  6wk ROV & [place] for a follow up visit w/ her son; she looks good & reports that she has been doing satis at home; they have help for her & husb several hours/d and this appears to be working for them; no new complaints or concerns; she has lost 2# down to 111# today & remains on Lasix40-2Qam, Diamox250 at4pm, KCl2Armadae has 2+edema in LEs w/o change 7 knows to avoid sodium etc...     BP=128/78 on above + Lisin5;  She remains on ASA81+Plavix75  for her ASHD, HxMI, ischemic cardiomyop & combined sys & diast CHF...    On Miralax, Ensure> not taking PPI now; Hx GERD, Divertics; needs thick-it for her fluids per speech path but refuses the fuss...    On Ditropan5-1/2Bid for bladder...    On Neurontin100Tid for neuropathy...    On Aricept10 for memory & Alprazolam 0.25 prn nerves...  We reviewed prob list, meds, xrays and labs> see below for updates >>  LABS 4/16:  Chems- ok w/ Cr=1.3 (incr from 1.1) and Ca=10.8 (incr from 10.4) w/ PO4=3.2;  CBC- anemic w/ Hg=10.3 (down from 12.8) & MCV=81...  Rec to start [place] before dinner... (NOTE> I spent 25 min face-to-face time w/ pt & son reviewing her medical problems, medications, & treatment plan.)         Problem List:  Hx of ALLERGIES (ICD-995.3) - she uses OTC meds as needed, & we prev discussed Zyrtek $RemoveBeforeDE'10mg'KTMnLUpWfcMzpcL$ Qam, Saline nasal mist, & Atrovent Nasal 0.03% Tid for drainage (one of her chronic complaints)... ~  8/15: we tries Astelin nasal spray for her chr complaints of dripping...  Hx of ASTHMATIC BRONCHITIS, ACUTE (ICD-466.0) - no recent exacerbations... ~  CXR 5/13 showed borderline heart size, kyphoscoliosis, no edema, and clear lungs... ~  CXR 10/13 showed normal heart size, clear lungs, kyphoscoliosis, osteopenia... ~  CXR 10/15> Hosp w/ CHF (cardiomeg, bilat effusions and interstitial edema); she was diuresed and disch to NHP/rehab=> f/u CXR 10/15 w/ improved aeration, persist bilat effusions... ~  CXR 11/15 showed small bilat effusions and rounded area ?fluid in fissure) on left lat chest.  HYPERTENSION (ICD-401.9) >>  ASHD, s/p NSTEMI, ischemic cardiomyopathy, combined sys & diast CHF>>  ~  controlled on ASA $Remo'81mg'lPeIv$ /d, NORVA'5mg'$ -1/2tab, LISINOPRIL5 & DEMADEX $RemoveB'20mg'xFxhvLEv$ /d & DIAMOX$RemoveBe'250mg'puLamKeUd$ /d... ~  4/12:  BP= 110/60 and she states that she is taking her meds regularly & tolerating them well... denies HA, visual changes, CP, palpit, change in dyspnea, etc... but notes  weakness, intermittently dizzy & persist edema in her feet...  ~  6/12:  BP= 124/70 & she remains stable... ~  8/12:  BP= 110/ 66 & stable... ~  10/12:  BP= 114/62 & stable... ~  1/13:  BP= 110/64 & she remains stable... ~  4/13:  BP= 116/58 & she is reminded to adjust Lasix 1-2 Qam according to her edema... ~  5/13:  BP= 140/72 & she is c/o pedal edema; we decided to change to Demadex $RemoveBe'20mg'GZZCuHGir$ - 2Qam. ~  7/13:  BP= 110/60 & her edema is diminished, still 1+ in feet... Labs showed HCO3=39 & DIAMOX $Remove'250mg'qqvDarS$ /d added. ~  8/13:  BP= 118/74 & edema is the same; ?what she's taking? & we decided to simplify regimen> Norvasc1/2, Lisin1/2, Demadex-1/d & Diamox-1/d... ~  9/13:  BP= 110/70 & she is clinically stable on these meds; add K10Bid for the K=3.4 today... ~  EKG 10/13 showed NSR, rate63, RBBB, poss old infarct anterolaterally... ~  11/13:  on ASA81, Amlod2.5, Lisin5, Demadex20-1/2, Diamox250, & K20/d; BP=122/64 & she is feeling better, still w/ mult somatic complaints... ~  12/13:  BP= 124/72 & her edema, weight, etc are about stable... ~  1/14:  BP= 118/60 & she persists w/ mult somatic complaints... ~  4/14:  on ASA81, Amlod2.5, Lisin5, Demadex20-1/2, Diamox250, & K10-3/d + KPhos-1Bid; BP=100/62 & she is feeling sl weak w/ mult somatic complaints; labs 4/14 show K=3.7, Phos=3.4, Cr=1.1; we decided to STOP the Amlodipine & work on sodium restriction ~  6/14: on ASA81, Lisiin5, Demadex20, Diamox250, & K10-3/d + KPhos-1Bid; BP= 110/68 & 3+edema persists; Labs- OK & we decided to STOP the Lisinopril & INCR the Demadex20-2Qam.... ~  7/14: on  ASA81, Demadex20-2/d, Diamox250, & K10-3/d + KPhos-1Bid; BP= 120/62 & she is improved w/ decr edema... ~  9/14:  BP remains well controlled on diuretics alone & measures 116/64 today.. ~  11/14: BP is controlled on ASA81, Demadex20-2Qam, Diamox250, & K10-3/d + KPhos-1Bid (off prev Amlod &  Lisin); BP=122/70 & she denies angina pain, palpit, ch in SOB etc; K is sl low at 3.2  7 rec to incr KCl10 to 2Bid. ~  3/15: BP= 106/64 on Demadex20Qam & Diamox250Qpm w/ K10-2Bid and KPhos1Bid... ~  6/15:  on ASA81, Demadex20-2Qam, Diamox250-at 4pm, & K10-2Bid + KPhos-1Bid; BP=100/60 & labs 6/15 show K=4.0, Phos=2.5, Cr=1.1; continue same meds for now. ~  8/15: on ASA81, Demadex20-2Qam, Diamox250 at 4pm & K10-2/d;  BP= 120/62, continue same... ~  9/15: she was [facility] by [facility] (Adm 9/14 - 09/02/14))w/ MI, cardiomyopathy w/ combined sys & diast CHF, & she refused invasive procedures/ cath etc; disch to [facility] rehab, then back home on ASA81, Plavix75, MetopER12.5, Lisin5, KPhos-Bid, & off all diuretics...  ~  2DEcho 9/15 showed mild LVH, reduced LVF w/ EF=45-50% w/ HK in apex & septum, Gr1DD, mild AS/AI, mildly thickened MV leaflets & mild LAdil, PAsys=40... ~  10/15: ret to office weaker w/ 19# wt gain & 4+edema off her diuretics w/ BP= 92/64, wt up 19# w/ 4+edema=> she was re-hosp w/ pulm edema (Adm 10/9 - 09/29/14), BNP=9280, given IV Lasix & med adjustment- disch on ASA81, Plavix75, Lasix40, Lisin5, KPos-Bid=> now w/ decr edema & BP=124/62 but f/u labs showed persist bilat pleural effusions, K=3.1, HCO3=42, Cr=1.1, BNP=485... REC to continue current meds, add Diamox250 at 4PM daily, add KCl-33mEq caps 2/d but this wasn't done by the NH... ~  11/15: She has lost another 5# & breathing is stable> CXR w/ persistent sm bilat effusions & ?fluid in fissure; Labs showed K=2.7, HCO3=40, Cr=1.1, & BNP=311; we decided to add the Diamox250 & K20Bid ~  12/15: it is apparent that she is not taking any of her meds since disch from the NH; daugh is clueless & son (who does her meds), doesn't know what they are or what they are for; [facility]ox, others. We discussed having visiting nurse supervision of meds & I talked to Jessica [name]... ~  1/16: she is here w/ her son who is still doing her meds he says> they brought med bottles and [facility] Lisin5, Lasix40, Diamox250,  KCl20Bid, KPhosBid; unfortunately her wt is the same at 114# & her edema remains 3-4+ in LEs; we will recheck labs & see if we can incr the Lasix dose=> Labs ok therefore incr Lasix40-2Qam. ~  2/16: on ASA81, Plavix75, Lisin5, Lasix40-2Qam, Diamox250Qpm, KCl20Bid & KPhosBid; edema about the same ~3+ & weight= 113# today; Rec to take meds regularly every day, no salt, support hose, elev legs etc...  VENOUS INSUFFICIENCY, CHRONIC & EDEMA - on low sodium diet, elevation, support hose- prev refused diuretic meds due to urinary symptoms, then prev on [facility] which was stopped by Cards in hosp 9/15... subseq gained 20# w/ 4+ pedal edema & re-hosp 10/15 w/ IV lasix & disch on oral Lasix but it is apparent that she did not fill the Rx & wasn't taking her meds... ~  1/16:  She is back on Lisin5, Lasix40, Diamox250, KCl20Bid, KPhosBid=> but she has not lost any wt or any of her pedal edema=> K & PO4 are wnl, Cr=1.14 therefore incr Lsaix40 to 2Qam.  HYPERCHOLESTEROLEMIA (ICD-272.0) - prev on Lip10, but she stopped this on her own... she has a very high HDL!!! ~  FLP was 1/08 showed TChol 253, TG 125, HDL 121, LDL 93 ~  FLP 2/09 showed TChol 200, TG 99, HDL 136, LDL 44 ~  FLP 4/11 showed TChol 244, TG 143, HDL 149, LDL  59 ~  FLP 1/12 showed TChol 266, TG 102, HDL 151, LDL 83 >> BEST HDL I'VE EVER SEEN ~  It has been hard to get her back in for FASTING labs... ~  [place] 9/15 in [place] on diet alone showed TChol 222, TG 61, HDL 159, LDL 51  HYPERCALCEMIA >> routine labs w/ Calcium levels betw 10.2 & 11.7 over the last 17yrs... ~  Labs 7/13 showed Ca= 11.8 &  PTH= 81... For now avoid calc supplements etc... ~  Labs 9/13 showed Ca= 11.0 & she is reminded- no calcium, no VitD... ~  Labs 10/13 Hosp reviewed> Ca was up, Phos was low, K was low> all improved w/ Rx in hosp (they did not repeat PTH level). ~  4/14:   assoc w/ low phos- c/w primary hyperparathyroidism- she is 81y/o & not felt to be an operative candidate; she  was started on KPhos 10/13 in [place] on 2Bid w/ improvement in serum Phos to 3.6; serum Ca = 11.2 now but PTH is not rising.  ~  Labs 11/14 showed Ca=10.3 ~  Labs 1/15 showed Ca= 10.5 ~  6/15:  Labs showed  Ca=11.2, PO4=2.5, PTH=108 c/w primary hyperparathyroidism; we are following... ~  Labs 8/15 improved w/ Ca= 10.4, Phos= 2.9 ~  Labs 4/16 showe4d Ca= 10.8, PO4= 3.2  GERD (ICD-530.81) - last EGD was 9/05 by DrPerry showing GERD and esoph stricture- dilated... she stopped her Nexium but states that her swallowing is good & she uses PRILOSEC OTC as needed. ~  4/16:  Labs showed Hg down to 10.3 w/ MCV= 81; requested to restart PRILOSEC20 before dinner & take FeSO4 $RemoveBe'325mg'gYrLHShCD$  daily in AM...  DIVERTICULOSIS OF COLON (ICD-562.10) - last colonoscopy was 11/00 showing divertics only... ~  8/12:  She notes some constipation & rec to take [place] per our usual protocol...  HX, URINARY INFECTION (ICD-V13.02) & INCONTINENCE symptoms... she has seen DrTannenbaum & staff on bladder exercises and she reported some improvement but has persistant symptoms & never followed up... ~  7/10:  offered f/u appt w/ Urology vs second opinion consult but she declines... ~  9/10:  wrote for trial Enablex 7.$RemoveBeforeD'5mg'iGBqTfhQaIgfmN$ /d >> no benefit she says. ~  12/10: saw DrNesi- tried sample med, sl improved, but "he turned me over to a lady for longer term treatments" she says... ~  4/12 & 6/12: states DrNesi hasn't been able to help her & she wants appt w/ DrTannenbaum==> seen w/ urodynamic eval revealing a sm hypersens bladder & a large bladder divertic; not a surg candidate, taught double voiding technique... ~  10/12: she had f/u DrTannenbaum & he reinforced prev w/u & need for double voiding technique... ~  10/13: In hosp Urine grew mult species, Rx w/ Roceph=> Cipro...  DEGENERATIVE JOINT DISEASE (ICD-715.90) - notes right shoulder symptoms, but managing OK w/ Mobic 7.$RemoveBe'5mg'UmgWZaNXY$  & Tylenol; also right hip & low back pain...  she  also c/o neuropathic "burning" in legs but not bad enough for additional meds she says... ~  8/14: we have a note from [facility] (7 page note reviewed)> pt c/o bilat foot & ankle pain & burning w/ numbness & tingling; Dx w/ somatic dysfunction in lumbar region; subluxations seen on XRays; given an adjustment... ~  9/14: symptoms improved on Gabapentin $RemoveBefor'100mg'epFRaDohQECG$  Tid she says...  OSTEOPOROSIS (ICD-733.00) - she was on Fosamax but had discontinued this on her own... we discussed the need for continued therapy and she was asked to restart  Alendronate but she never did...  she takes Ca++, MVI, VitD==> rec to STOP the Calcium.  SENILE DEMENTIA (ICD-290.0) - she takes ASA 81mg /d... she tried Aricept in the past but didn't notice any improvement therefore stopped it. ~  she & her husb still live on their own but are having numerous difficulties while still resisting any thoughts of assisted living etc> got concerned because she saw an impulse in her left antecubital fossa (tortuous brachial art), went to the ER & this got translated to palpitations & lead to full ER cardiac eval= neg... ~  6/11: MMSE showed 26/30 & she agreed to try DONEPEZIL 10mg /d... ~  6/12:  Still taking Aricept & appears stable... ~  7/13:  No change, same meds, clinically stable... ~  7/14:  She remains stable and will be 81 y/o in Oct... ~  3/15:  She remains on Aricept5Bid- she prefers it this way & won't change...  ANXIETY (ICD-300.00) - she uses Alpraz 0.25mg  as needed.  Hx of SHINGLES (ICD-053.9)   Past Surgical History  Procedure Laterality Date  . Vesicovaginal fistula closure w/ tah    . Tonsillectomy    . Abdominal hysterectomy      Outpatient Encounter Prescriptions as of 03/21/2015  Medication Sig  . acetaZOLAMIDE (DIAMOX) 250 MG tablet Take 1 tablet (250 mg total) by mouth daily. At 4 pm  . ALPRAZolam (XANAX) 0.25 MG tablet Take 0.125-0.25 mg by mouth 3 (three) times daily as needed. For nerves  .  aspirin EC 81 MG tablet Take 81 mg by mouth daily.  . clopidogrel (PLAVIX) 75 MG tablet TAKE 1 TABLET BY MOUTH EVERY DAY  . donepezil (ARICEPT) 10 MG tablet Take 1 tablet (10 mg total) by mouth at bedtime.  . feeding supplement (ENSURE COMPLETE) LIQD Take 237 mLs by mouth 2 (two) times daily between meals.  . furosemide (LASIX) 40 MG tablet TAKE 2 TABLETS BY MOUTH EVERY MORNING  . gabapentin (NEURONTIN) 100 MG capsule Take 1 capsule (100 mg total) by mouth 3 (three) times daily.  05/21/2015 lisinopril (PRINIVIL,ZESTRIL) 5 MG tablet TAKE 1 TABLET (5 MG TOTAL) BY MOUTH DAILY.  . Multiple Vitamin (MULTIVITAMIN) tablet Take 1 tablet by mouth daily.    . nitroGLYCERIN (NITROSTAT) 0.4 MG SL tablet Place 1 tablet (0.4 mg total) under the tongue every 5 (five) minutes x 3 doses as needed for chest pain.  01-24[birth year]1989 oxybutynin (DITROPAN) 5 MG tablet Start with 1/2 tablet two times daily  . PHOSPHA 250 NEUTRAL 155-852-130 MG tablet TAKE 1 TABLET BY MOUTH 2 (TWO) TIMES DAILY.  [facility] polyethylene glycol (MIRALAX / GLYCOLAX) packet Take 17 g by mouth daily.  . potassium chloride SA (KLOR-CON M20) 20 MEQ tablet Take 1 tablet (20 mEq total) by mouth 2 (two) times daily.    No Known Allergies   Current Medications, Allergies, Past Medical History, Past Surgical History, Family History, and Social History were reviewed in [facility] record.    Review of Systems        See HPI - all other systems neg except as noted...      The patient complains of decreased hearing, dyspnea on exertion, peripheral edema, incontinence, muscle weakness, and difficulty walking.  The patient denies anorexia, fever, weight loss, weight gain, vision loss, hoarseness, chest pain, syncope, prolonged cough, headaches, hemoptysis, abdominal pain, melena, hematochezia, severe indigestion/heartburn, hematuria, suspicious skin lesions, transient blindness, depression, unusual weight change, abnormal bleeding, enlarged lymph nodes, and  angioedema.  Objective:   Physical Exam      WD, WN, 81 y/o BF in NAD... GENERAL:  Alert, pleasant & cooperative; she is slow moving as noted... HEENT:  Rives/AT, EOM-full, EACs- some wax, TMs-wnl, NOSE-clear, THROAT-clear & wnl. NECK:  Supple w/ fairROM; no JVD; normal carotid impulses w/o bruits; no thyromegaly or nodules palpated; no lymphadenopathy. CHEST:  Clear to P & A; without wheezes/ rales/ or rhonchi., noted kyphosis... HEART:  Regular Rhythm; Gr1/6 SEM, without rubs or gallops heard... ABDOMEN:  Soft & nontender; normal bowel sounds; no organomegaly or masses detected. EXT:  mod arthritic changes; no varicose veins/ +venous insuffic/ 2+edema  NEURO:  weak, no focal neuro deficits... DERM:  No lesions noted; no rash etc...  MMSE> 06/13/10> Score 26/30 (missed place, 2/3 recall after distraction, & copy figure)...   Assessment & Plan:    ASHD, s/p NSTEMI, combined sys/diast CHF, VI, edema>   [facility] she declined invasive options, treated medically- she will f/u w/ Cards regarding options avail...   =>  MEDS:  ASA81, Plavix75, Lisinopril5, Lasix40-2Qam, Diamox250Qpm, KCl20Bid, KPhos-1Bid 10/15> we tried to add Diamox250 & K10 but neither were started as requested in the NH... 11/15> she is now home again, we have ordered Diamox250 & K20Bid to be added to her meds=> in follow up it was apparent that she never filled these Rxs. 12/15> Pt is not capable of doing her own meds, son who lives w/ pt & daughter who brings her to [facility] are similarly incapable of providing this supervision, I have talked w/ Jackelyn Poling at [name]. 1/16> she appears to be taking her meds properly at this point but still has 3-4+edema & wt w/o change; Labs improved, therefore incr Lasix to $Remove'80mg'jFOsNmz$ Qam... 4/16> stable on meds w/ Lasix80, Diamox250, KCl & KPhos; continue same + no salt, elevation, support hose, etc...   AR & ASTHMA>  Breathing is stable, at baseline, she has chr rhinorrhea  complaints w/ numerous discussions regarding management of this problem....  Ven Insuffic/ Edema>>  We have had mult conversations at each [place] regarding no salt, elevation, support hose; she developed marked increase edema off the Demadex/ Diamox after hosp 9/15 and never filled the Lasix Rx after the 10/15 hosp; we are adjusting diuretics again as above...  CHOL>  She has the world's best HDL!!!  Electrolye Abn>  See 10/13 Hosp by [facility], it is apparent that her elev calcium is the result of HYPERPARATHYROIDISM but at age 56 we are reluctant to refer for surg unless progressive... We are following her serum Calcium level & non-progressive. Hypercalcemia>  Prob from Hyperpara & she has stopped the calcium supplements, & we are following the labs...  GU>  She saw DrTannenbaum w/ extensive investigation into her voiding symptoms & urodynamics as above; rec to use "double voiding" technique... 8/15> still complaining & declines ret to Urology; try Ditropan $RemoveBefo'5mg'SFHrjuJKutf$ - 1/2 tab Bid...  DJD/ Osteopenia>  Aware, she is stable on OTC meds, she has repeatedly refuse Bisphos meds for her bones...  TIA, Dementia>  On ASA81, Plavix75; She & husb Laveda Abbe are still living independently w/ daugh help but they are very fragile yet won't consider NHP...  Other medical problems as noted => 4/16 her Hg was down to 10.3 w/ MCV= 81; rec to start Prilosec20 before dinner & FeSO4 $Remov'325mg'fPrVkN$  daily in AM...   Patient's Medications  New Prescriptions             FeSO4 $Remo'325mg'VhTDq$  one tab daily in  AM...             Prilosec $RemoveB'20mg'OxBKZwSL$  - one cap daily about 30 min before dinner...  Previous Medications   ACETAZOLAMIDE (DIAMOX) 250 MG TABLET    Take 1 tablet (250 mg total) by mouth daily. At 4 pm   ALPRAZOLAM (XANAX) 0.25 MG TABLET    Take 0.125-0.25 mg by mouth 3 (three) times daily as needed. For nerves   ASPIRIN EC 81 MG TABLET    Take 81 mg by mouth daily.   CLOPIDOGREL (PLAVIX) 75 MG TABLET    TAKE 1 TABLET BY MOUTH EVERY DAY   DONEPEZIL  (ARICEPT) 10 MG TABLET    Take 1 tablet (10 mg total) by mouth at bedtime.   FEEDING SUPPLEMENT (ENSURE COMPLETE) LIQD    Take 237 mLs by mouth 2 (two) times daily between meals.       Lasix $Remo'40mg'vLdwO$  - take 2 tabs ($Remov'80mg'HUGnCJ$ ) each AM...    GABAPENTIN (NEURONTIN) 100 MG CAPSULE    Take 1 capsule (100 mg total) by mouth 3 (three) times daily.   LISINOPRIL (PRINIVIL,ZESTRIL) 5 MG TABLET    TAKE 1 TABLET (5 MG TOTAL) BY MOUTH DAILY.   MULTIPLE VITAMIN (MULTIVITAMIN) TABLET    Take 1 tablet by mouth daily.     NITROGLYCERIN (NITROSTAT) 0.4 MG SL TABLET    Place 1 tablet (0.4 mg total) under the tongue every 5 (five) minutes x 3 doses as needed for chest pain.   OXYBUTYNIN (DITROPAN) 5 MG TABLET    Start with 1/2 tablet two times daily   PHOSPHA 250 NEUTRAL 155-852-130 MG TABLET    TAKE 1 TABLET BY MOUTH 2 (TWO) TIMES DAILY.   POLYETHYLENE GLYCOL (MIRALAX / GLYCOLAX) PACKET    Take 17 g by mouth daily.   POTASSIUM CHLORIDE SA (KLOR-CON M20) 20 MEQ TABLET    Take 1 tablet (20 mEq total) by mouth 2 (two) times daily.  Modified Medications   No medications on file  Discontinued Medications   FUROSEMIDE (LASIX) 40 MG TABLET    TAKE 2 TABLETS BY MOUTH EVERY MORNING

## 2015-03-30 IMAGING — CR DG CHEST 2V
2 series · 2 of 2 positions shown · non-contrast
Comparison: 09/23/2014 and earlier chest x-rays, chest CT on
08/30/2014

CLINICAL DATA: Followup from hospital visit. No complaints. History
of hypertension.

EXAM:
CHEST  2 VIEW

[view not recorded (1 of 2)]
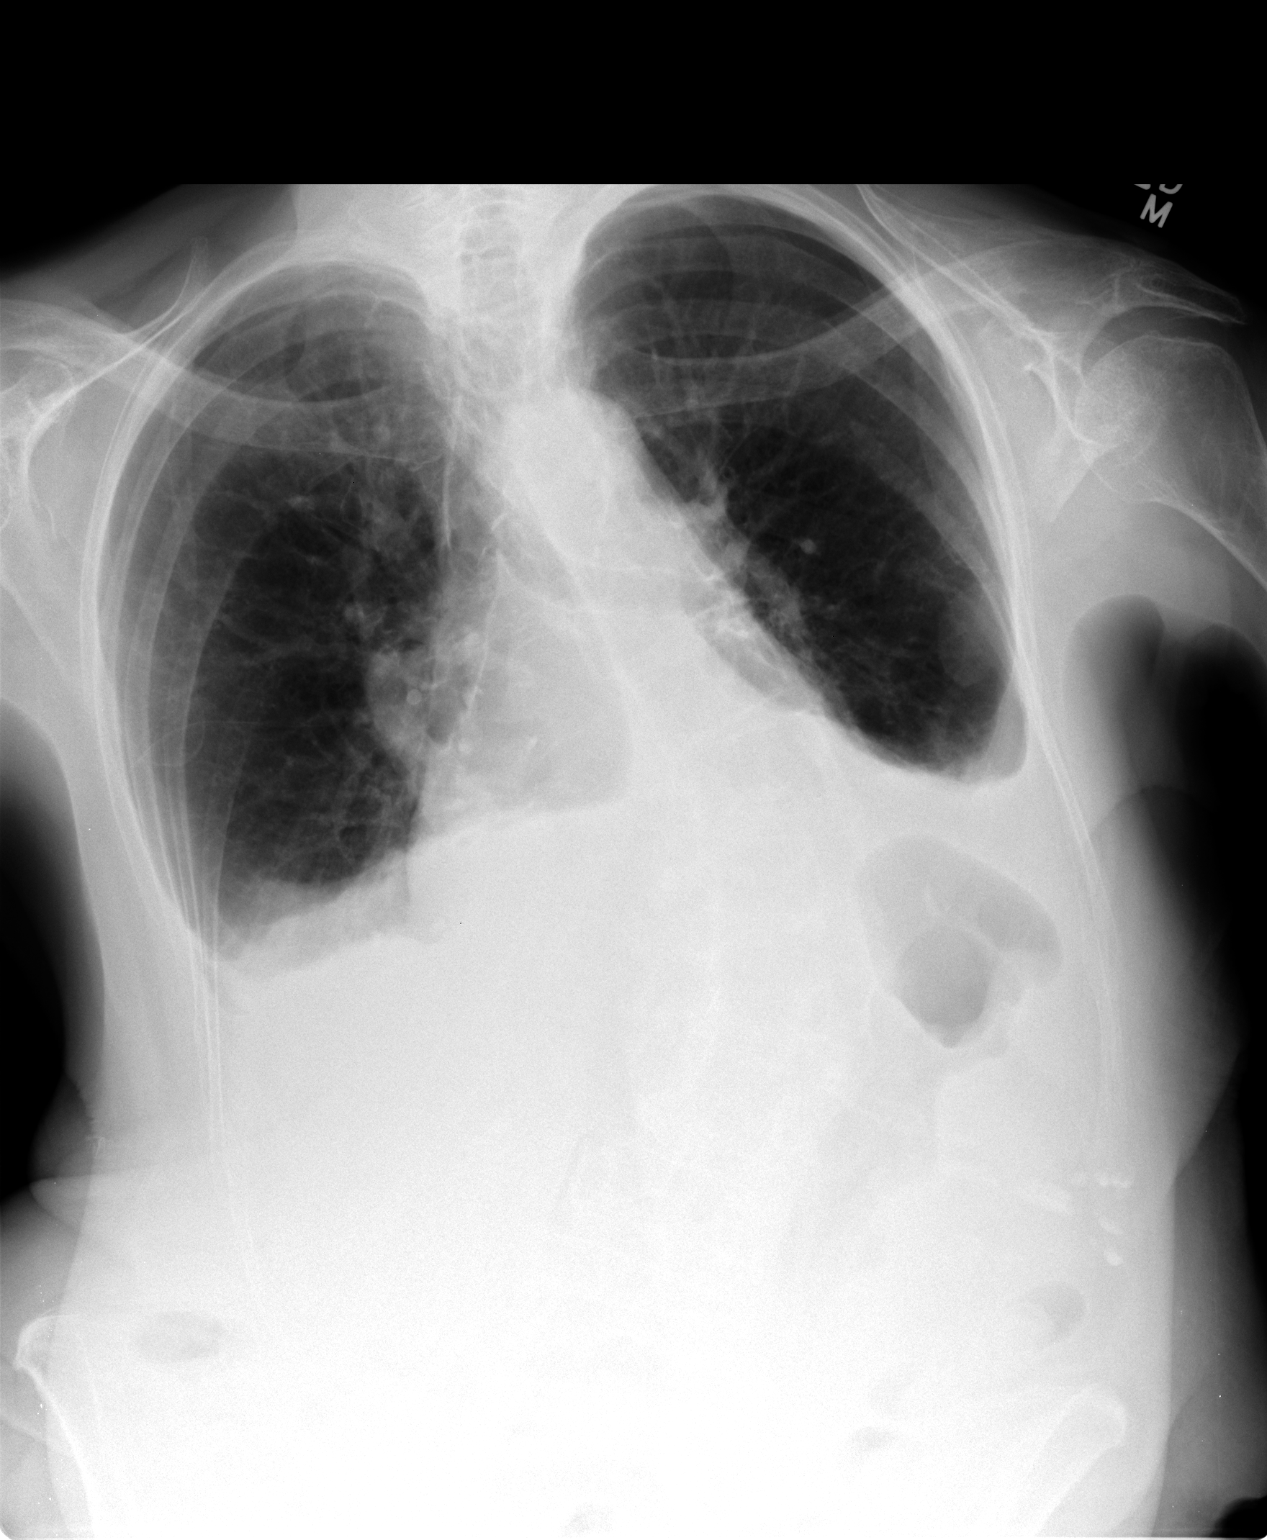

[view not recorded (2 of 2)]
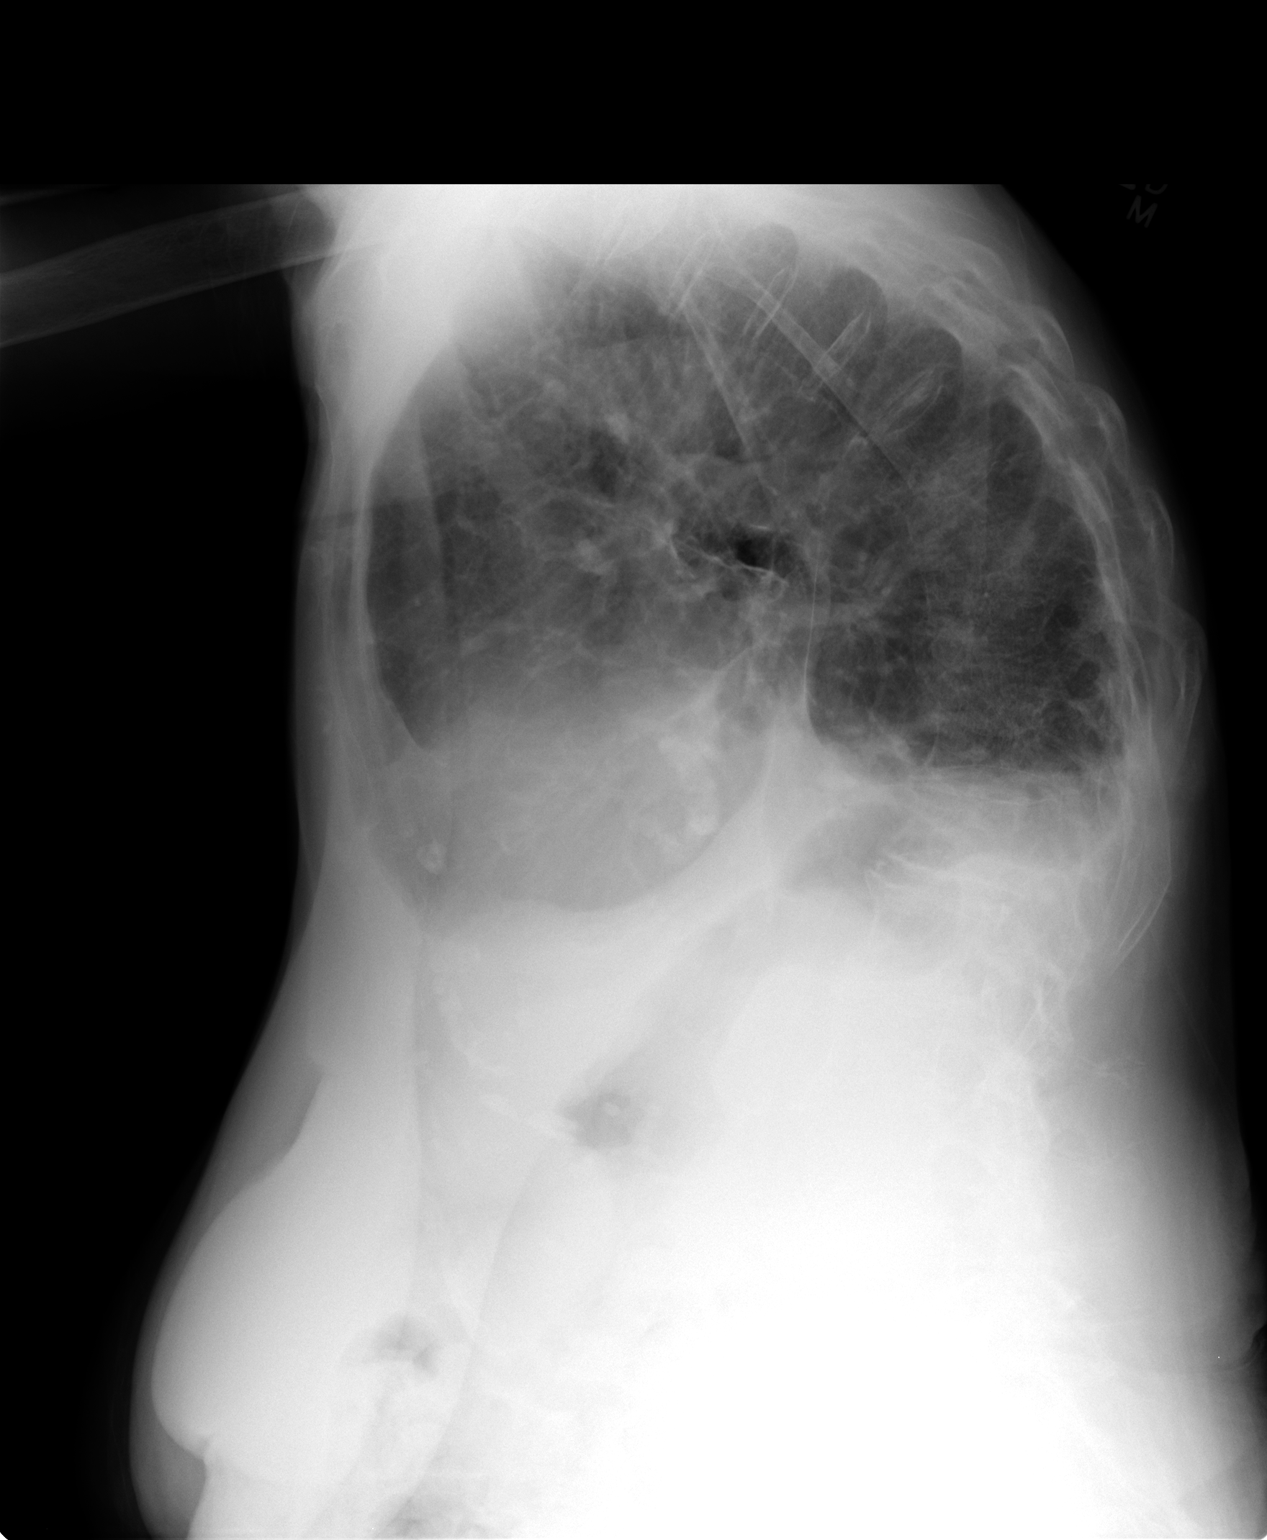

[2 of 2 positions shown; findings below may reference images not displayed]

FINDINGS: Heart is enlarged. There are bilateral pleural effusions. There has
been improvement induration since the previous exam. Patient is
kyphotic. Overlying the left lateral lung base there is an oval
density which is well circumscribed. Findings are felt to be related
to fluid within the fissure or device external to the patient. There
is no correlate for this finding on prior CT exam of the chest.

Patient has S shaped scoliosis of the thoracolumbar spine.
IMPRESSION: 1. Improved aeration since the prior studies.
2. Persistent bilateral pleural effusions.
3. Artifact versus fissure fluid on the left.  See above.

## 2015-04-05 ENCOUNTER — Telehealth: Payer: Self-pay | Admitting: Pulmonary Disease

## 2015-04-05 MED ORDER — DONEPEZIL HCL 10 MG PO TABS: 10.0000 mg | ORAL_TABLET | Freq: Every day | ORAL | Status: DC

## 2015-04-05 NOTE — Telephone Encounter (Signed)
Called pt and is aware RX has been sent in. noting further needed

## 2015-04-10 ENCOUNTER — Encounter: Payer: Self-pay | Admitting: Podiatry

## 2015-04-10 ENCOUNTER — Ambulatory Visit (INDEPENDENT_AMBULATORY_CARE_PROVIDER_SITE_OTHER): Payer: Medicare PPO | Admitting: Podiatry

## 2015-04-10 ENCOUNTER — Telehealth: Payer: Self-pay | Admitting: Pulmonary Disease

## 2015-04-10 VITALS — BP 119/72 | HR 46 | Resp 18

## 2015-04-10 DIAGNOSIS — M79676 Pain in unspecified toe(s): Secondary | ICD-10-CM

## 2015-04-10 DIAGNOSIS — B351 Tinea unguium: Secondary | ICD-10-CM

## 2015-04-10 NOTE — Telephone Encounter (Signed)
Lmtcb x1 for pt  

## 2015-04-10 NOTE — Progress Notes (Signed)
Patient ID: [name], female   DOB: [birth year], 81 y.o.   MRN: [id number]  Subjective: 81 y.o.-year-old female returns the office today for painful, elongated, thickened toenails which she is unable to trim herself. Denies any redness or drainage around the nails. Denies any acute changes since last appointment and no new complaints today. Denies any systemic complaints such as fevers, chills, nausea, vomiting.   Objective: AAO 3, NAD Neurovascular status unchanged Nails hypertrophic, dystrophic, elongated, brittle, discolored 10. There is tenderness overlying the nails 1-5 bilaterally. There is no surrounding erythema or drainage along the nail sites. No open lesions or pre-ulcerative lesions are identified. No other areas of tenderness bilateral lower extremities. No overlying edema, erythema, increased warmth. No pain with calf compression, swelling, warmth, erythema.  Assessment: Patient presents with symptomatic onychomycosis  Plan: -Treatment options including alternatives, risks, complications were discussed -Nails sharply debrided 10 without complication/bleeding. -Discussed daily foot inspection. If there are any changes, to call the office immediately.  -Follow-up in 3 months or sooner if any problems are to arise. In the meantime, encouraged to call the office with any questions, concerns, changes symptoms.

## 2015-04-11 NOTE — Telephone Encounter (Signed)
Called and spoke to pt. Pt stated she has questions about [employer] and she is unable to reach [facility]. Advised pt we are not able to call her insurance for any change in insurance questions. Gave pt the member service number. Pt verbalized understanding and denied any further questions or concerns at this time.

## 2015-04-12 ENCOUNTER — Other Ambulatory Visit: Payer: Self-pay | Admitting: Pulmonary Disease

## 2015-04-12 ENCOUNTER — Telehealth: Payer: Self-pay | Admitting: Pulmonary Disease

## 2015-04-12 DIAGNOSIS — M150 Primary generalized (osteo)arthritis: Secondary | ICD-10-CM

## 2015-04-12 DIAGNOSIS — F411 Generalized anxiety disorder: Secondary | ICD-10-CM

## 2015-04-12 DIAGNOSIS — F0390 Unspecified dementia without behavioral disturbance: Secondary | ICD-10-CM

## 2015-04-12 DIAGNOSIS — R0600 Dyspnea, unspecified: Secondary | ICD-10-CM

## 2015-04-12 DIAGNOSIS — M159 Polyosteoarthritis, unspecified: Secondary | ICD-10-CM

## 2015-04-12 DIAGNOSIS — M810 Age-related osteoporosis without current pathological fracture: Secondary | ICD-10-CM

## 2015-04-12 NOTE — Telephone Encounter (Signed)
Spoke with pt this morning to inform that SN had ordered physical therapy to start on her spouse. Pt stated at this time that she thinks she would benefit as well with physical therapy as she is finding she is becoming weaker and is afraid of falling. i advised that i would speak with SN in regards to this matter.  SN, please advise. Thanks

## 2015-04-12 NOTE — Telephone Encounter (Signed)
Per SN, order was placed for evaluation on physical therapy for pt. Order placed. Nothing further is needed at this time.

## 2015-05-10 ENCOUNTER — Other Ambulatory Visit: Payer: Self-pay | Admitting: Cardiovascular Disease

## 2015-05-10 NOTE — Telephone Encounter (Signed)
Rx(s) sent to pharmacy electronically.  

## 2015-05-13 ENCOUNTER — Other Ambulatory Visit: Payer: Self-pay | Admitting: Pulmonary Disease

## 2015-05-23 ENCOUNTER — Ambulatory Visit: Payer: Medicare PPO | Admitting: Pulmonary Disease

## 2015-05-29 ENCOUNTER — Ambulatory Visit (INDEPENDENT_AMBULATORY_CARE_PROVIDER_SITE_OTHER): Payer: Medicare PPO | Admitting: Pulmonary Disease

## 2015-05-29 ENCOUNTER — Encounter: Payer: Self-pay | Admitting: Pulmonary Disease

## 2015-05-29 VITALS — BP 100/58 | HR 50 | Temp 97.1°F | Resp 18 | Ht 65.0 in | Wt 110.0 lb

## 2015-05-29 DIAGNOSIS — I4510 Unspecified right bundle-branch block: Secondary | ICD-10-CM

## 2015-05-29 DIAGNOSIS — R609 Edema, unspecified: Secondary | ICD-10-CM

## 2015-05-29 DIAGNOSIS — R531 Weakness: Secondary | ICD-10-CM

## 2015-05-29 DIAGNOSIS — I872 Venous insufficiency (chronic) (peripheral): Secondary | ICD-10-CM

## 2015-05-29 DIAGNOSIS — F0390 Unspecified dementia without behavioral disturbance: Secondary | ICD-10-CM

## 2015-05-29 DIAGNOSIS — I5042 Chronic combined systolic (congestive) and diastolic (congestive) heart failure: Secondary | ICD-10-CM | POA: Diagnosis not present

## 2015-05-29 DIAGNOSIS — I2510 Atherosclerotic heart disease of native coronary artery without angina pectoris: Secondary | ICD-10-CM

## 2015-05-29 DIAGNOSIS — R269 Unspecified abnormalities of gait and mobility: Secondary | ICD-10-CM

## 2015-05-29 MED ORDER — FE BISGLY-VIT C-VIT B12-FA 28-60-0.008-0.4 MG PO CAPS: 1.0000 | ORAL_CAPSULE | Freq: Every day | ORAL | Status: DC

## 2015-05-29 NOTE — Progress Notes (Signed)
Subjective:    Patient ID: Jessica [name], female    DOB: 04-27-1911, 81 y.o.   MRN: [id number]  HPI 81 y/o BF here for a follow up visit...  She has mult medical problems including:  AR & runny nose;  Asthmatic Bronchitis;  HBP;  Chronic VI & edema;  Hyperchol;  GERD & esoph stricture;  Divertics;  Hx Urinary incontinence & UTIs;  DJD/ Osteoporosis;  Semile Dementia;  Anxiety...   ~  SEE PREV EPIC NOTES FOR EARLIER DATA >>   LABS 4/14:  Chems- Ca=11.2 but PTH is sl lower than prev at 86;  Phos=3.4 on KPhos1tabBid; K= 3.7 on this & K10- 2AM+1PM;  LFTs= wnl and Alb= 4.0 on Ensure...  Labs 7/14:  Chems- wnl x Ca=10.8, continue same meds...  LABS 11/14:  Chems- ok x K=3.2, BUN=24, Cr=1.1;  CBC w/ Hg=11.8;  TSH=2.48...  LABS 1/15:  Chems- ok w/ K=3.5, Cr=1.2  ~  May 20, 2014:  11moROV & Jessica [name] is stable, same chr complaints which she like to discuss Q218mo.     HBP> on ASA81, Demadex20-2Qam, Diamox250-at 4pm, & K10-2Bid + KPhos-1Bid; BP=100/60 & labs 6/15 show K=4.0, Phos=2.5, Cr=1.1; continue same meds for now...    VI, edema> she knows to elim sodium, elev legs, wear support hose, take Demadex & Diamox as directed...    Chol> on diet alone; she has a very hi HDL ~150 when measured in 2012...    Hypercalcemia> assoc w/ low phos- c/w primary hyperparathyroidism- she is 81y/o & not felt to be an operative candidate; we are following Ca, PO4, PTH levels...     GI- GERD, Divertics> off prev Prilosec20, Miralax, Senakot-S; advised these all as needed...    GU> saw DrTannenbaum for Urology 10/12- mult complaints, urodynamics done w/ sm hypersens bladder unstable w/ leakage, & large bladder divertic> he rec double voiding technique...    DJD, Osteopenia> off Ca supplements, on KPhos-2Bid, on MVI & encouraged to walk as much as poss...    Senile Dementia> on Aricept10 & Alpraz0.25 as needed... We reviewed prob list, meds, xrays and labs> see below for updates >>   LABS 6/15:  Chems- ok x Ca=11.2,  PO4=2.5, PTH=108 c/w primary hyperpara;  CBC- Hg=12.3;  TSH=2.09...   ~  July 20, 2014:  60m11moV & Jessica [name] stable overall- states that she has been doing fairly well but has a list of questions and wants me to re-order visiting nurses to check on her & husb every week or so (I explained that [facility] & see what she is eligible for under Medicare)...     1) she wants a nasal spray for drippy nose> try ASTELIN 1-2 sp in each nostril Bid as needed...    2) she is c/o burning discomfort in legs & we reviewed her Neuropathy Dx & the reason for the Gabapentin Rx- she is only taking 100m360md & gets some benefit from this she says; offered to incr dose but she can't remember to take it Tid 7 dosen't want to incr to 2Bid so we decided to keep it the same for now...    3) she is c/o urinary symptoms- leaking, some incont, denies burning/ odor/ pain/ blood etc; we discussed checking UA & C&S; we reviewed prev evals from DrNesi & DrTannenbaum- offered ROV w/ these providers but she declined; finally agreed to try Ditropan5mg-65m2tab bid for her symptoms and let me know if she is  better...    4) she wants f/u labs and is due for BMet & Phos level (f/u hyperpara on observation)...  We reviewed prob list, meds, xrays and labs> see below for updates >> she brought med bottles and we reviewed her Rx today...  LABS 8/15:  Chems- ok x K=3.1 and Cr=1.2;  Phos=2.9;  UA= clear...   ~  September 20, 2014:  71moROV & post hosp visit> [facility] by [facility] 9/14 - 09/02/14 w/ SOB, she denied CP, EKG was unchanged but Troponins were elev, D-dimer was also abn & a CTAngioChest was done- neg for PE, and VenDopplers were neg for DVT; she was treated w/ Heparin transiently;  2DEcho showed decr LVF w/ EF=45-50% w/ areas of HK, & Gr1DD w/ mild AS/AI- felt to have prob anteroseptal MI w/ distal LAD lesion likely but she refused invasive eval & was made a NCB/ DNR/ med rx only;  Meds were adjusted  w/ Hep=> Plavix added, BBlocker continued, and ACE added; [facility]...  In the rehab facility she was managed by the SSt[place]their [place] are reviewed...  She was disch home 10/2 w/ home healt PT/ OT/ CNA/ Nursing/ and Social worker all arranged...  After disch she returned to the ER 10/3 w/ paresthesias and weakness> seen by ER staff & Neuro consultant; BP was recorded at 180-190 sys, CT Brain & MRI were neg for acute infarct & she was felt to have had a TIA- already on Plavix & w/ nothing further to offer she was sent home...  She comes in today w/ daugh & appears weaker, BP is low at 92/64, marked increase in leg edema since off her diuretics (stopped during recent hosp w/ her NSTEMI), going downhill but ever stoic she says she is doing satis at home w/ BSC, walker, etc; they truly love the home health care assist w/ Pt/ OT/ HHN/ aides/ & social worker; they again decline to consider NHP for pt & her husband...  We reviewed the following medical problems during today's office visit >>     HBP> prev on Demadex20-2Qam, Diamox250-at 4pm, & K10-2Bid + KPhos-1Bid; but now off all diuretics on MetopER25-1/2 daily, Lisinopril5 & KPhos-1Bid; BP=92/64 & no wiggle room to add back diuretics=> she will f/u w/ Cards soon...     ASHD, s/p NSTEMI, ischemic cardiomyopathy, combined sys & diast CHF> Adm 9/15 by Cards w/ NSTEMI & combined sys/diast CHF; she declined intervention; medical therapy adjusted- off diuretics, added MetopER12.5/ Lisin5/ ASA81/ Plavix75, but edema incr.    VI, edema> off diuretics now per Cards adjustment 9/15; she knows to elim sodium, elev legs, wear support hose; she has f/u appt w/ Cards soon...    Chol> on diet alone; she has a very hi HDL ~150 when measured in 2012...    Hypercalcemia> assoc w/ low phos- c/w primary hyperparathyroidism- she is 81y/o & not felt to be an operative candidate; we are following Ca, PO4, PTH  levels...     GI- GERD, Divertics> off prev Prilosec20, Miralax, Senakot-S; advised these all as needed...    GU> saw DrTannenbaum for Urology 10/12- mult complaints, urodynamics done w/ sm hypersens bladder unstable w/ leakage, & large bladder divertic> he rec double voiding technique & Ditropan5-1/2Bid...    ?TIA, Neuropathy> on Neurontin100Tid; she went to the ER 10/15 one day after disch from the NH w/ numbness=> NEG CTBrain & MRI w/o acute  changes, symptoms passed & sent home on same ASA81 + Plavix75/d...    DJD, Osteopenia> off Ca supplements, on KPhos-1Bid, on MVI & encouraged to walk as much as poss...    Senile Dementia> on Aricept5 & Alpraz0.25 as needed... We reviewed prob list, meds, xrays and labs> see below for updates >>   LABS 10/15:  FLP- ok on diet alone w/ HDL=159!  Chems- ok w/ Cr=1.01, K=4.1, BS=104, A1c=6.0, Ca=10.8;  CBC- Hg=11.7;  TSH=2.48...  CXR 9/15 showed mild cardiomeg, clear lungs, poor inspiration, osteopenia, scoliosis w/o change, NAD.Marland KitchenMarland Kitchen  EKG 9/15 showed SBrady, rate47, PACs, RBBB, left axis deviation, NSSTTWA...   2DEcho 9/15 showed mild LVH, reduced LVF w/ EF=45-50% w/ HK in apex & septum, Gr1DD, mild AS/AI, mildly thickened MV leaflets & mild LAdil, PAsys=40...  CT Angio Chest 9/15 showed neg for PE, mildly enlarged cardiac chambers, Ao looked ok, COPD/E bilat w/ atx vs scarring at bases, scoliosis & DDD, ?nonobstructing stone in mid right kidney...  CT Head 10/15 showed mild diffuse atrophy, no acute changes...  MRI Head 10/15 revealed no infarct, hemorrhage, or mass; mild atrophy & sm vessel dis, NAD...  PLAN>> she has gained 19# since last here w/ marked edema (4+bilat in legs) since she's been off her diuretics; however BP is low at 92/64 on her low dose Metop & Lisin per Cards; advised no salt/ elev legs/ support hose/ & f/u w/ Cards ASAP as planned (not many options here- perhaps stop BBlocker, add diuretic, consider pacer);  She is happy w/ all the home  health support...  ~  October 06, 2014:  2wk ROV & post-hosp visit> after last OV she was hosp by [facility] from 10/9 - 09/29/14 w/ incr SOB (O2sat=84% on RA), edema (BNP=9280, CXR w/ pulm edema), and weakness; but no CP or other symptoms and troponins were neg;  She was diuresed and her meds were adjusted (Metoprolol stopped due to low heart rate); disch to NHP for rehab and she is loving the attention "they are working with me"- on Lasix40 & KPhos bid, plus her ASA/ Plavix/ Lisin5...  Her CC is constipation> going every 3-4 days & firm, no blood reported; she is rec to start a regimen w/ Miralax daily & Senakot-S 2Qhs... We discussed rechecking her CXR (improved aeration) and LABS (K=3.1, HCO3=42, Cr=1.1, BNP=485) today... REC to continue Lasix40, Add back the Diamox250/d & the KCl- 31mq caps 2/d as before... ROV in 2-3 weeks...     We reviewed prob list, meds, xrays and labs> see below for updates >>   CXR 10/15 Hosp showed cardiomeg, engorged pulm vasculature, increased markings, can't r/o effusions, all c/w pulm edema...   CXR 10/15 office f/u showed cardiomeg, improved aeration, persist bilat effusions...  LABS 10/15 office f/u>  Chems- K=3.1, HCO3=42, BUN=16, Cr=1.1, Ca=10.4, BNP=485...   ~  October 25, 2014:  3wk ROV & Jessica [name] still in [employer] to be released home in 3d her daughter says; [facility] health support w/ visiting nurses, PT/ OT, etc; Phys therapy has checked her & the home situation & they feel she is safe for DC home; Daughter needs schooling on pt's home care needs, feeding, use of "thick-it", and wants to know "how to keep the pill box filled"...  [facility] w/ PA-Luke & they felt she was doing OK, no changes made, f/u 347mo I note that the Diamox & KCl we tried to add after  her last OV here is not on her list- ?what happened? Since she was here last she has lost another 5# but still has 2+edema in LEs, breathing is  OK, she is avoiding salt she says;    Hx HBP, ASHD, s/pNSTEMI, ischemic cardiomyop (EF=45-50% w/ HK), combined sys&diast CHF, etc> on ASA81, Plavix75, Lisin5, Lasix40, KPhos2/d; Prev ordered Diamox250 & K10 are not on her list; BP= 122/64 & she denies ch in SOB, edema (2+), etc;  Labs w/ K=2.7, HCO3=40, Cr=1.1, BNP=311;  CXR w/ sm bilat effusions persist;  Plan is to add the Diamox250 one daily at 4PM, & KCl47mqBid, continue other meds the same...     On Miralax, Senakot, Ensure> not taking PPI now; Hx GERD, Divertics; needs thick-it for her fluids per speech path & daughter must be taught...    On Ditropan5-1/2Bid for bladder...    On Neurontin100Tid for neuropathy...    On Aricept5 for memory & Alprazolam 0.25 prn nerves...  We reviewed prob list, meds, xrays and labs> see below for updates >> we reconciled med lists   CXR 11/15 showed small bilat effusions and rounded area ?fluid in fissure) on left lat chest...  LABS 11/15:  Chems- K=2.7, HCO3=40, Cr=1.1;  BNP=311... PLAN>>  Continue meds, we started KCl 260m Bid and added DIAMOX25055mne tab at 4PM daily... ROV in 3 wks.  ~  November 28, 2014:  69mo76mo & recheck> despite all efforts- conversation w/ daughter who brings pt to office/ son who lives w/ pt/ [facility] nurses from [facility] not taking the prescribed meds properly, [facility]'t [facility] Las[facility], Diamox, or the KCl!...   I discussed this w/ the daughter but she doesn't understand & called her brother, I talked to him on the phone and he doesn't know the meds or have interest in providing this service for his mother;  Finally I called [facility]-[phone] to [place] to increase services to MrsSolomon to whatever level is needed to provide the needed support 7 total supervision of her meds etc...    CV meds> ASA81, Plavix75, Lisinopril5, Lasix40Qam, Diamox250Qpm, KCl20Bid, KPhos-1Bid    Neuro meds> Aricept5Bid,  Neurontin100Tid, Xanax0.25Tid prn    Others> Ditropan5-1/2Bid, Miralax daily, MVI, Ensure  ~  December 29, 2014:  69mo 64mo& recheck> JuliaDemetriusere w/ her son (first time I have met him) who does her meds; [facility] coming out but apparently NOT supervising her meds (no notes or calls from them)- still done by son,placing pills in a weekly med box & checking to be sure she is taking it properly; they brought all her med bottles today- [facility]date of last [facility]); unfortunately she has not lost any edema (still 3-4+ in both feet/ lower legs and her wt is the same as last month ~114#.  She feels the same- denies CP, palpit, SOB, dizzy, syncope, etc; remains weak, in wheelchair at 81y/o, & VS are stable w/ BP=100/52, pulse= 60/min, O2sat=94% on RA; Chest has diminished BS at bases bilat but no wheezing, rales, rhonchi; Cardiac exam unchanged w/ Gr1/6 SEM no rubs or gallops apprec...     We reviewed prob list, meds, xrays and labs> see below for updates >>   LABS 1/16:  Chems- ok w/ K=3.9, BS=100, Cr=1.14, Po4=2.9...  PMarland KitchenMarland KitchenN>> we are checking her metabolic labs to see if we can safely increase her Lasix dose  to affect a better diuresis; in the meanwhile she is reminded to elim salt/ sodium for her diet, elev legs, wear support hose; given 2gm Na diet sheet; we plan ROV recheck in 68mo.. => Chems OK therefore increase Lasix427m 2tabsQam.  ~  February 07, 2015:  56m638moV & she is here w/ her son> they assure me that she is taking her meds properly as outlined in her med list... She states that she is doing satis, stable, and she denies SOB; still wheelchair bound, difficulty getting up & difficulty w/ personal care needs; she feels that the swelling in feet/legs is about the same & weight is 113# down 1# today...    She saw DrBerry for Cards 1/16> HBP, ASHD, s/pNSTEMI, ischemic cardiomyop w/ combined sys&diast CHF, etc> 2DEcho 9/15 showing mild LVH,  reduced LVF w/ EF=45-50% w/ HK in apex & septum, Gr1DD, mild AS/AI, mildly thickened MV leaflets & mild LAdil, PAsys=40;  On ASA81, Plavix75, Lisin5, Lasix40-2Qam, Diamox250Qpm, KClTonopahEdema looks about the same at 3+ in feet & lower legs, exam otherw unchanged and they did not add or change any meds, f/u planned 68mo25mo   We reviewed prob list, meds, xrays and labs> see below for updates >>  PLAN>>  We decided to continue same meds for now; reminded- NO SALT/ SODIUM in diet;  Wear support hose & keep legs up;  ROV in 6 weeks w/ CXR & lab work planned...  ~  March 21, 2015:  6wk ROV & JuliSymonehere for a follow up visit w/ her son; she looks good & reports that she has been doing satis at home; they have help for her & husb several hours/d and this appears to be working for them; no new complaints or concerns; she has lost 2# down to 111# today & remains on Lasix40-2Qam, Diamox250 at4pm, KCl2Whittiere has 2+edema in LEs w/o change 7 knows to avoid sodium etc...     BP=128/78 on above + Lisin5;  She remains on ASA81+Plavix75 for her ASHD, HxMI, ischemic cardiomyop & combined sys & diast CHF...    On Miralax, Ensure> not taking PPI now; Hx GERD, Divertics; needs thick-it for her fluids per speech path but refuses the fuss...    On Ditropan5-1/2Bid for bladder...    On Neurontin100Tid for neuropathy...    On Aricept10 for memory & Alprazolam 0.25 prn nerves...  We reviewed prob list, meds, xrays and labs> see below for updates >>   LABS 4/16:  Chems- ok w/ Cr=1.3 (incr from 1.1) and Ca=10.8 (incr from 10.4) w/ PO4=3.2;  CBC- anemic w/ Hg=10.3 (down from 12.8) & MCV=81...  Rec to start [place]ore dinner... (NOTE> I spent 25 min face-to-face time w/ pt & son reviewing her medical problems, medications, & treatment plan.)  ~  May 29, 2015:  38mo 24mo& Jessica [name]'s situation remains the same- doing satis at home w/ son looking in & helping out; she has lost another  1# to 110# today; she has mult somatic complaints> not getting about well, post nasal drip, decr ROM right shoulder; she notes that her breathing is good...     HBP> on Lisinopril5, Lasix40-2Qam, Diamox250_0 , K20Bid & KPhos1Bid; BP=100/58 & she has persistent edema, mult somatic complaints; she will f/u w/ Cards soon...     ASHD, s/p NSTEMI, ischemic cardiomyopathy, combined sys & diast CHF> Adm 9/15 by [facility] w/ NSTEMI & combined sys/diast CHF; she declined intervention; followed by  DrBerry/Croitoru etal on above + ASA81/Plavix75; last seen 1/16 w/ f/u planned 76mo..     VI, edema> on Lasix80 & Diamox250; she knows to elim sodium, elev legs, wear support hose; she has f/u appt w/ Cards soon...    Chol> on diet alone; she has a very hi HDL ~150 when measured in 2012...    Hypercalcemia> assoc w/ low phos- c/w primary hyperparathyroidism- she is 81y/o & not felt to be an operative candidate; we are following Ca, PO4, PTH levels (non-progressive)...     GI- GERD, Divertics> off prev Prilosec20, Miralax, Senakot-S; advised these all as needed...    GU> saw DrTannenbaum for Urology 10/12- mult complaints, urodynamics done w/ sm hypersens bladder unstable w/ leakage, & large bladder divertic> he rec double voiding technique & Ditropan5-1/2Bid...    ?TIA, Neuropathy> on Neurontin100Tid; she went to the ER 10/15 one day after disch from the NH w/ numbness=> NEG CTBrain & MRI w/o acute changes, symptoms passed & sent home on same ASA81 + Plavix75/d...    DJD, Osteopenia> off Ca supplements, on KPhos-1Bid, on MVI & encouraged to walk as much as poss...    Senile Dementia> on Aricept10 & Alpraz0.25 as needed...    Anemia> on slow release iron tab daily... We reviewed prob list, meds, xrays and labs>>  IMP/PLAN>>  JGinnieis stable but very frail; her son does a good job supervising all meds and helping w/ their home care; needs to elim all sodium! She requests to continue follow up visits every 261mo.          Problem List:  Hx of ALLERGIES (ICD-995.3) - she uses OTC meds as needed, & we prev discussed Zyrtek 1025mm, Saline nasal mist, & Atrovent Nasal 0.03% Tid for drainage (one of her chronic complaints)... ~  8/15: we tries Astelin nasal spray for her chr complaints of dripping...  Hx of ASTHMATIC BRONCHITIS, ACUTE (ICD-466.0) - no recent exacerbations... ~  CXR 5/13 showed borderline heart size, kyphoscoliosis, no edema, and clear lungs... ~  CXR 10/13 showed normal heart size, clear lungs, kyphoscoliosis, osteopenia... ~  CXR 10/15> Hosp w/ CHF (cardiomeg, bilat effusions and interstitial edema); she was diuresed and disch to NHP/rehab=> f/u CXR 10/15 w/ improved aeration, persist bilat effusions... ~  CXR 11/15 showed small bilat effusions and rounded area ?fluid in fissure) on left lat chest.  HYPERTENSION (ICD-401.9) >>  ASHD, s/p NSTEMI, ischemic cardiomyopathy, combined sys & diast CHF>>  ~  controlled on ASA 20m49m NORVASC5mg-31mtab, LISINOPRIL5 & DEMADEX 20mg/23mDIAMOX250mg/d56m~  4/12:  BP= 110/60 and she states that she is taking her meds regularly & tolerating them well... denies HA, visual changes, CP, palpit, change in dyspnea, etc... but notes weakness, intermittently dizzy & persist edema in her feet...  ~  6/12:  BP= 124/70 & she remains stable... ~  8/12:  BP= 110/ 66 & stable... ~  10/12:  BP= 114/62 & stable... ~  1/13:  BP= 110/64 & she remains stable... ~  4/13:  BP= 116/58 & she is reminded to adjust Lasix 1-2 Qam according to her edema... ~  5/13:  BP= 140/72 & she is c/o pedal edema; we decided to change to Demadex 20mg- 273m ~  7/13:  BP= 110/60 & her edema is diminished, still 1+ in feet... Labs showed HCO3=39 & DIAMOX 250mg/d a53m. ~  8/13:  BP= 118/74 & edema is the same; ?what she's taking? & we decided to simplify regimen> Norvasc1/2, Lisin1/2, Demadex-1/d & Diamox-1/d... ~  9/13:  BP= 110/70 & she is clinically stable on these meds; add K10Bid for the K=3.4  today... ~  EKG 10/13 showed NSR, rate63, RBBB, poss old infarct anterolaterally... ~  11/13:  on ASA81, Amlod2.5, Lisin5, Demadex20-1/2, Diamox250, & K20/d; BP=122/64 & she is feeling better, still w/ mult somatic complaints... ~  12/13:  BP= 124/72 & her edema, weight, etc are about stable... ~  1/14:  BP= 118/60 & she persists w/ mult somatic complaints... ~  4/14:  on ASA81, Amlod2.5, Lisin5, Demadex20-1/2, Diamox250, & K10-3/d + KPhos-1Bid; BP=100/62 & she is feeling sl weak w/ mult somatic complaints; labs 4/14 show K=3.7, Phos=3.4, Cr=1.1; we decided to STOP the Amlodipine & work on sodium restriction ~  6/14: on ASA81, Lisiin5, Demadex20, Diamox250, & K10-3/d + KPhos-1Bid; BP= 110/68 & 3+edema persists; Labs- OK & we decided to STOP the Lisinopril & INCR the Demadex20-2Qam.... ~  7/14: on  ASA81, Demadex20-2/d, Diamox250, & K10-3/d + KPhos-1Bid; BP= 120/62 & she is improved w/ decr edema... ~  9/14:  BP remains well controlled on diuretics alone & measures 116/64 today.. ~  11/14: BP is controlled on ASA81, Demadex20-2Qam, Diamox250, & K10-3/d + KPhos-1Bid (off prev Amlod & Lisin); BP=122/70 & she denies angina pain, palpit, ch in SOB etc; K is sl low at 3.2 7 rec to incr KCl10 to 2Bid. ~  3/15: BP= 106/64 on Demadex20Qam & Diamox250Qpm w/ K10-2Bid and KPhos1Bid... ~  6/15:  on ASA81, Demadex20-2Qam, Diamox250-at 4pm, & K10-2Bid + KPhos-1Bid; BP=100/60 & labs 6/15 show K=4.0, Phos=2.5, Cr=1.1; continue same meds for now. ~  8/15: on ASA81, Demadex20-2Qam, Diamox250 at 4pm & K10-2/d;  BP= 120/62, continue same... ~  9/15: she was [facility] by Cards (Adm 9/14 - 09/02/14))w/ MI, cardiomyopathy w/ combined sys & diast CHF, & she refused invasive procedures/ cath etc; disch to [facility] rehab, then back home on ASA81, Plavix75, MetopER12.5, Lisin5, KPhos-Bid, & off all diuretics...  ~  2DEcho 9/15 showed mild LVH, reduced LVF w/ EF=45-50% w/ HK in apex & septum, Gr1DD, mild AS/AI, mildly thickened MV  leaflets & mild LAdil, PAsys=40... ~  10/15: ret to office weaker w/ 19# wt gain & 4+edema off her diuretics w/ BP= 92/64, wt up 19# w/ 4+edema=> she was re-hosp w/ pulm edema (Adm 10/9 - 09/29/14), BNP=9280, given IV Lasix & med adjustment- disch on ASA81, Plavix75, Lasix40, Lisin5, KPos-Bid=> now w/ decr edema & BP=124/62 but f/u labs showed persist bilat pleural effusions, K=3.1, HCO3=42, Cr=1.1, BNP=485... REC to continue current meds, add Diamox250 at 4PM daily, add KCl-46mq caps 2/d but this wasn't done by the NH... ~  11/15: She has lost another 5# & breathing is stable> CXR w/ persistent sm bilat effusions & ?fluid in fissure; Labs showed K=2.7, HCO3=40, Cr=1.1, & BNP=311; we decided to add the Diamox250 & K20Bid ~  12/15: it is apparent that she is not taking any of her meds since disch from the NH; daugh is clueless & son (who does her meds), doesn't know what they are or what they are for; [facility], others. We discussed having visiting nurse supervision of meds & I talked to GIran.. ~  1/16: she is here w/ her son who is still doing her meds he says> they brought med bottles and [facility] Lisin5, Lasix40, Diamox250, KCl20Bid, KPhosBid; unfortunately her wt is the same at 114# & her edema remains 3-4+ in LEs; we will recheck labs & see if we can incr the  Lasix dose=> Labs ok therefore incr Lasix40-2Qam. ~  2/16: on ASA81, Plavix75, Lisin5, Lasix40-2Qam, Diamox250Qpm, KCl20Bid & KPhosBid; edema about the same ~3+ & weight= 113# today; Rec to take meds regularly every day, no salt, support hose, elev legs etc...  VENOUS INSUFFICIENCY, CHRONIC & EDEMA - on low sodium diet, elevation, support hose- prev refused diuretic meds due to urinary symptoms, then prev on [facility] which was stopped by Cards in hosp 9/15... subseq gained 20# w/ 4+ pedal edema & re-hosp 10/15 w/ IV lasix & disch on oral Lasix but it is apparent that she did not fill the Rx & wasn't  taking her meds... ~  1/16:  She is back on Lisin5, Lasix40, Diamox250, KCl20Bid, KPhosBid=> but she has not lost any wt or any of her pedal edema=> K & PO4 are wnl, Cr=1.14 therefore incr Lsaix40 to 2Qam.  HYPERCHOLESTEROLEMIA (ICD-272.0) - prev on Lip10, but she stopped this on her own... she has a very high HDL!!! ~  FLP was 1/08 showed TChol 253, TG 125, HDL 121, LDL 93 ~  FLP 2/09 showed TChol 200, TG 99, HDL 136, LDL 44 ~  FLP 4/11 showed TChol 244, TG 143, HDL 149, LDL 59 ~  FLP 1/12 showed TChol 266, TG 102, HDL 151, LDL 83 >> BEST HDL I'VE EVER SEEN ~  It has been hard to get her back in for FASTING labs... ~  [facility] 9/15 in [place] on diet alone showed TChol 222, TG 61, HDL 159, LDL 51  HYPERCALCEMIA >> routine labs w/ Calcium levels betw 10.2 & 11.7 over the last 44yr... ~  Labs 7/13 showed Ca= 11.8 &  PTH= 81... For now avoid calc supplements etc... ~  Labs 9/13 showed Ca= 11.0 & she is reminded- no calcium, no VitD... ~  Labs 10/13 Hosp reviewed> Ca was up, Phos was low, K was low> all improved w/ Rx in hosp (they did not repeat PTH level). ~  4/14:   assoc w/ low phos- c/w primary hyperparathyroidism- she is 81y/o & not felt to be an operative candidate; [place]/ improvement in serum Phos to 3.6; serum Ca = 11.2 now but PTH is not rising.  ~  Labs 11/14 showed Ca=10.3 ~  Labs 1/15 showed Ca= 10.5 ~  6/15:  Labs showed  Ca=11.2, PO4=2.5, PTH=108 c/w primary hyperparathyroidism; we are following... ~  Labs 8/15 improved w/ Ca= 10.4, Phos= 2.9 ~  Labs 4/16 showe4d Ca= 10.8, PO4= 3.2  GERD (ICD-530.81) - last EGD was 9/05 by DrPerry showing GERD and esoph stricture- dilated... she stopped her Nexium but states that her swallowing is good & she uses PRILOSEC OTC as needed. ~  4/16:  Labs showed Hg down to 10.3 w/ MCV= 81; requested to restart PRILOSEC20 before dinner & take FeSO4 3271mdaily in AM...  DIVERTICULOSIS OF COLON (ICD-562.10) -  last colonoscopy was 11/00 showing divertics only... ~  8/12:  She notes some constipation & rec to take MIParksideer our usual protocol...  HX, URINARY INFECTION (ICD-V13.02) & INCONTINENCE symptoms... she has seen DrTannenbaum & staff on bladder exercises and she reported some improvement but has persistant symptoms & never followed up... ~  7/10:  offered f/u appt w/ Urology vs second opinion consult but she declines... ~  9/10:  wrote for trial Enablex 7.8m28m >> no benefit she says. ~  12/10: saw DrNesi- tried sample med, sl improved, but "he  turned me over to a lady for longer term treatments" she says... ~  4/12 & 6/12: states DrNesi hasn't been able to help her & she wants appt w/ DrTannenbaum==> seen w/ urodynamic eval revealing a sm hypersens bladder & a large bladder divertic; not a surg candidate, taught double voiding technique... ~  10/12: she had f/u DrTannenbaum & he reinforced prev w/u & need for double voiding technique... ~  10/13: In hosp Urine grew mult species, Rx w/ Roceph=> Cipro...  DEGENERATIVE JOINT DISEASE (ICD-715.90) - notes right shoulder symptoms, but managing OK w/ Mobic 7.6m & Tylenol; also right hip & low back pain...  she also c/o neuropathic "burning" in legs but not bad enough for additional meds she says... ~  8/14: [place] pt c/o bilat foot & ankle pain & burning w/ numbness & tingling; Dx w/ somatic dysfunction in lumbar region; subluxations seen on XRays; given an adjustment... ~  9/14: symptoms improved on Gabapentin 1071mTid she says...  OSTEOPOROSIS (ICD-733.00) - she was on Fosamax but had discontinued this on her own... we discussed the need for continued therapy and she was asked to restart Alendronate but she never did...  she takes Ca++, MVI, VitD==> rec to STOP the Calcium.  SENILE DEMENTIA (ICD-290.0) - she takes ASA 8148m... she tried Aricept in the past but didn't notice any  improvement therefore stopped it. ~  she & her husb still live on their own but are having numerous difficulties while still resisting any thoughts of assisted living etc> got concerned because she saw an impulse in her left antecubital fossa (tortuous brachial art), went to the ER & this got translated to palpitations & lead to full ER cardiac eval= neg... ~  6/11: MMSE showed 26/30 & she agreed to try DONEPEZIL 33m37m.. ~  6/12:  Still taking Aricept & appears stable... ~  7/13:  No change, same meds, clinically stable... ~  7/14:  She remains stable and will be 100 72 in Oct... ~  3/15:  She remains on Aricept5Bid- she prefers it this way & won't change...  ANXIETY (ICD-300.00) - she uses Alpraz 0.25mg20mneeded.  Hx of SHINGLES (ICD-053.9)   Past Surgical History  Procedure Laterality Date  . Vesicovaginal fistula closure w/ tah    . Tonsillectomy    . Abdominal hysterectomy      Outpatient Encounter Prescriptions as of 05/29/2015  Medication Sig  . acetaZOLAMIDE (DIAMOX) 250 MG tablet Take 1 tablet (250 mg total) by mouth daily. At 4 pm  . ALPRAZolam (XANAX) 0.25 MG tablet Take 0.125-0.25 mg by mouth 3 (three) times daily as needed. For nerves  . aspirin EC 81 MG tablet Take 81 mg by mouth daily.  . clopidogrel (PLAVIX) 75 MG tablet TAKE 1 TABLET BY MOUTH EVERY DAY  . donepezil (ARICEPT) 10 MG tablet Take 1 tablet (10 mg total) by mouth at bedtime.  . feeding supplement (ENSURE COMPLETE) LIQD Take 237 mLs by mouth 2 (two) times daily between meals.  . furosemide (LASIX) 40 MG tablet TAKE 2 TABLETS BY MOUTH EVERY MORNING  . furosemide (LASIX) 40 MG tablet TAKE 2 TABLETS BY MOUTH EVERY MORNING  . gabapentin (NEURONTIN) 100 MG capsule TAKE 1 CAPSULE (100 MG TOTAL) BY MOUTH 3 (THREE) TIMES DAILY.  . lisMarland Kitchennopril (PRINIVIL,ZESTRIL) 5 MG tablet Take 1 tablet (5 mg total) by mouth daily.  . Multiple Vitamin (MULTIVITAMIN) tablet Take 1 tablet by mouth daily.    .Marland Kitchen  nitroGLYCERIN  (NITROSTAT) 0.4 MG SL tablet Place 1 tablet (0.4 mg total) under the tongue every 5 (five) minutes x 3 doses as needed for chest pain.  Marland Kitchen oxybutynin (DITROPAN) 5 MG tablet Start with 1/2 tablet two times daily  . PHOSPHA 250 NEUTRAL 155-852-130 MG tablet TAKE 1 TABLET BY MOUTH 2 (TWO) TIMES DAILY.  Marland Kitchen polyethylene glycol (MIRALAX / GLYCOLAX) packet Take 17 g by mouth daily.  . potassium chloride SA (KLOR-CON M20) 20 MEQ tablet Take 1 tablet (20 mEq total) by mouth 2 (two) times daily.  Marland Kitchen Fe Bisgly-Vit C-Vit B12-FA (GENTLE IRON) 28-60-0.008-0.4 MG CAPS Take 1 capsule by mouth daily.   No facility-administered encounter medications on file as of 05/29/2015.    No Known Allergies   Current Medications, Allergies, Past Medical History, Past Surgical History, Family History, and Social History were reviewed in [employer] record.    Review of Systems        See HPI - all other systems neg except as noted...      The patient complains of decreased hearing, dyspnea on exertion, peripheral edema, incontinence, muscle weakness, and difficulty walking.  The patient denies anorexia, fever, weight loss, weight gain, vision loss, hoarseness, chest pain, syncope, prolonged cough, headaches, hemoptysis, abdominal pain, melena, hematochezia, severe indigestion/heartburn, hematuria, suspicious skin lesions, transient blindness, depression, unusual weight change, abnormal bleeding, enlarged lymph nodes, and angioedema.     Objective:   Physical Exam      WD, WN, 81 y/o BF in NAD... GENERAL:  Alert, pleasant & cooperative; she is slow moving as noted... HEENT:  [place]/AT, EOM-full, EACs- some wax, TMs-wnl, NOSE-clear, THROAT-clear & wnl. NECK:  Supple w/ fairROM; no JVD; normal carotid impulses w/o bruits; no thyromegaly or nodules palpated; no lymphadenopathy. CHEST:  Clear to P & A; without wheezes/ rales/ or rhonchi., noted kyphosis... HEART:  Regular Rhythm; Gr1/6 SEM, without rubs  or gallops heard... ABDOMEN:  Soft & nontender; normal bowel sounds; no organomegaly or masses detected. EXT:  mod arthritic changes; no varicose veins/ +venous insuffic/ 2+edema  NEURO:  weak, no focal neuro deficits... DERM:  No lesions noted; no rash etc...  MMSE> 06/13/10> Score 26/30 (missed place, 2/3 recall after distraction, & copy figure)...   Assessment & Plan:    ASHD, s/p NSTEMI, combined sys/diast CHF, VI, edema>   [facility] she declined invasive options, treated medically- she will f/u w/ Cards regarding options avail...   =>  MEDS:  ASA81, Plavix75, Lisinopril5, Lasix40-2Qam, Diamox250Qpm, KCl20Bid, KPhos-1Bid 10/15> we tried to add Diamox250 & K10 but neither were started as requested in the NH... 11/15> she is now home again, we have ordered Diamox250 & K20Bid to be added to her meds=> in follow up it was apparent that she never filled these Rxs. 12/15> Pt is not capable of doing her own meds, son who lives w/ pt & daughter who brings her to OVs are similarly incapable of providing this supervision, I have talked w/ Jackelyn Poling at [facility]. 1/16> she appears to be taking her meds properly at this point but still has 3-4+edema & wt w/o change; Labs improved, therefore incr Lasix to 19mQam... 4/16> stable on meds w/ Lasix80, Diamox250, KCl & KPhos; continue same + no salt, elevation, support hose, etc... 6/16> stable on same meds, asked to elim all sodium etc...  AR & ASTHMA>  Breathing is stable, at baseline, she has chr rhinorrhea complaints w/ numerous discussions regarding management of this problem..Marland KitchenMarland KitchenMarland Kitchen  Ven Insuffic/ Edema>>  We have had mult conversations at each [facility] regarding no salt, elevation, support hose; she developed marked increase edema off the Demadex/ Diamox after hosp 9/15 and never filled the Lasix Rx after the 10/15 hosp; we are adjusting diuretics again as above...  CHOL>  She has the world's best HDL!!!  Electrolye Abn>  See 10/13 Hosp by  [facility], it is apparent that her elev calcium is the result of HYPERPARATHYROIDISM but at age 47 we are reluctant to refer for surg unless progressive... We are following her serum Calcium level & non-progressive. Hypercalcemia>  Prob from Hyperpara & she has stopped the calcium supplements, & we are following the labs...  GU>  She saw DrTannenbaum w/ extensive investigation into her voiding symptoms & urodynamics as above; rec to use "double voiding" technique... 8/15> still complaining & declines ret to Urology; try Ditropan 5m- 1/2 tab Bid...  DJD/ Osteopenia>  Aware, she is stable on OTC meds, she has repeatedly refuse Bisphos meds for her bones...  TIA, Dementia>  On ASA81, Plavix75; She & husb CLaveda Abbeare still living independently w/ daugh help but they are very fragile yet won't consider NHP...  Other medical problems as noted => 4/16 her Hg was down to 10.3 w/ MCV= 81; rec to start Prilosec20 before dinner & FeSO4 3245mdaily in AM...   Patient's Medications  New Prescriptions   FE BISGLY-VIT C-VIT B12-FA (GENTLE IRON) 28-60-0.008-0.4 MG CAPS    Take 1 capsule by mouth daily.  Previous Medications   ACETAZOLAMIDE (DIAMOX) 250 MG TABLET    Take 1 tablet (250 mg total) by mouth daily. At 4 pm   ALPRAZOLAM (XANAX) 0.25 MG TABLET    Take 0.125-0.25 mg by mouth 3 (three) times daily as needed. For nerves   ASPIRIN EC 81 MG TABLET    Take 81 mg by mouth daily.   CLOPIDOGREL (PLAVIX) 75 MG TABLET    TAKE 1 TABLET BY MOUTH EVERY DAY   DONEPEZIL (ARICEPT) 10 MG TABLET    Take 1 tablet (10 mg total) by mouth at bedtime.   FEEDING SUPPLEMENT (ENSURE COMPLETE) LIQD    Take 237 mLs by mouth 2 (two) times daily between meals.   FUROSEMIDE (LASIX) 40 MG TABLET    TAKE 2 TABLETS BY MOUTH EVERY MORNING   FUROSEMIDE (LASIX) 40 MG TABLET    TAKE 2 TABLETS BY MOUTH EVERY MORNING   GABAPENTIN (NEURONTIN) 100 MG CAPSULE    TAKE 1 CAPSULE (100 MG TOTAL) BY MOUTH 3 (THREE) TIMES DAILY.   LISINOPRIL  (PRINIVIL,ZESTRIL) 5 MG TABLET    Take 1 tablet (5 mg total) by mouth daily.   MULTIPLE VITAMIN (MULTIVITAMIN) TABLET    Take 1 tablet by mouth daily.     NITROGLYCERIN (NITROSTAT) 0.4 MG SL TABLET    Place 1 tablet (0.4 mg total) under the tongue every 5 (five) minutes x 3 doses as needed for chest pain.   OXYBUTYNIN (DITROPAN) 5 MG TABLET    Start with 1/2 tablet two times daily   PHOSPHA 250 NEUTRAL 155-852-130 MG TABLET    TAKE 1 TABLET BY MOUTH 2 (TWO) TIMES DAILY.   POLYETHYLENE GLYCOL (MIRALAX / GLYCOLAX) PACKET    Take 17 g by mouth daily.   POTASSIUM CHLORIDE SA (KLOR-CON M20) 20 MEQ TABLET    Take 1 tablet (20 mEq total) by mouth 2 (two) times daily.  Modified Medications   No medications on file  Discontinued Medications   No medications  on file

## 2015-05-29 NOTE — Patient Instructions (Signed)
Today we updated your med list in our EPIC system...    Continue your current medications the same...  Remember to eliminate the salt/ sodium from your diet...  Call for any questions...  Let's plan a follow up visit in 33mo, sooner if needed for problems.Marland KitchenMarland Kitchen

## 2015-06-07 ENCOUNTER — Other Ambulatory Visit: Payer: Self-pay | Admitting: Pulmonary Disease

## 2015-06-17 ENCOUNTER — Other Ambulatory Visit: Payer: Self-pay | Admitting: Pulmonary Disease

## 2015-06-21 ENCOUNTER — Ambulatory Visit (INDEPENDENT_AMBULATORY_CARE_PROVIDER_SITE_OTHER): Payer: Medicare PPO | Admitting: Cardiology

## 2015-06-21 ENCOUNTER — Encounter: Payer: Self-pay | Admitting: Cardiology

## 2015-06-21 VITALS — BP 122/60 | HR 39 | Ht 61.0 in | Wt 101.0 lb

## 2015-06-21 DIAGNOSIS — I214 Non-ST elevation (NSTEMI) myocardial infarction: Secondary | ICD-10-CM

## 2015-06-21 DIAGNOSIS — I10 Essential (primary) hypertension: Secondary | ICD-10-CM | POA: Diagnosis not present

## 2015-06-21 DIAGNOSIS — R001 Bradycardia, unspecified: Secondary | ICD-10-CM

## 2015-06-21 DIAGNOSIS — I5042 Chronic combined systolic (congestive) and diastolic (congestive) heart failure: Secondary | ICD-10-CM

## 2015-06-21 NOTE — Assessment & Plan Note (Signed)
Stable, no CHF

## 2015-06-21 NOTE — Assessment & Plan Note (Signed)
Medical Rx only-Oct 2015

## 2015-06-21 NOTE — Patient Instructions (Signed)
Your physician wants you to follow-up in: 6 Months with Dr Berry. You will receive a reminder letter in the mail two months in advance. If you don't receive a letter, please call our office to schedule the follow-up appointment.    

## 2015-06-21 NOTE — Progress Notes (Signed)
06/21/2015 Jessica [name]   1911-06-10  [id number]  Primary Physician NADEL,SCOTT Judie Petit, MD Primary Cardiologist: Dr Allyson Sabal  HPI:  Delightful 81 AA female w/ hx HTN, HLD, LE edema, GERD, admitted Sept 2015 w/ SOB. Work up done revealed ECG without cute changes, troponin was elevated as well as d-dimer. CTA was negative for PE. Venous dopplers negative for DVT. 2D echo demonstrated mildly reduced systolic function with EF 45-50%. Compared to prior 2D echo in 2010, there is a new wall motion abnormality in the distal LAD artery distribution. Probable anteroseptal MI suspected with probable proximal LAD stenosis. Medical therapy was recommended and she was transferred to SNF for rehab.   She was re admitted 09/23/14 with acute combined CHF exacerbation. Her medications were adjusted and she was discharged back to SNF for rehab 09/29/14. She was seen by Dr Ladona Ridgel with Palliative Care and DNR was confirmed. She was bradycardiac during that admission and beta blocker was discontinued.              She saw Dr Allyson Sabal in Jan 2016 and was doing well. She is in the office today for a 6 month visit. She denies increased dyspnea and she has not been hospitalized. Her daughter accompanied her. The pt has not had syncope or dizziness (HR 40).    Current Outpatient Prescriptions  Medication Sig Dispense Refill  . acetaZOLAMIDE (DIAMOX) 250 MG tablet Take 1 tablet (250 mg total) by mouth daily. At 4 pm 30 tablet 6  . ALPRAZolam (XANAX) 0.25 MG tablet Take 0.125-0.25 mg by mouth 3 (three) times daily as needed. For nerves    . aspirin EC 81 MG tablet Take 81 mg by mouth daily.    . clopidogrel (PLAVIX) 75 MG tablet TAKE 1 TABLET BY MOUTH EVERY DAY 30 tablet 3  . donepezil (ARICEPT) 10 MG tablet Take 1 tablet (10 mg total) by mouth at bedtime. 30 tablet 5  . Fe Bisgly-Vit C-Vit B12-FA (GENTLE IRON) 28-60-0.008-0.4 MG CAPS Take 1 capsule by mouth daily. 30 each 0  . feeding supplement (ENSURE COMPLETE) LIQD  Take 237 mLs by mouth 2 (two) times daily between meals. 60 Bottle 0  . furosemide (LASIX) 40 MG tablet TAKE 2 TABLETS BY MOUTH EVERY MORNING 60 tablet 3  . gabapentin (NEURONTIN) 100 MG capsule TAKE 1 CAPSULE (100 MG TOTAL) BY MOUTH 3 (THREE) TIMES DAILY. 90 capsule 1  . lisinopril (PRINIVIL,ZESTRIL) 5 MG tablet Take 1 tablet (5 mg total) by mouth daily. 30 tablet 5  . Multiple Vitamin (MULTIVITAMIN) tablet Take 1 tablet by mouth daily.      . nitroGLYCERIN (NITROSTAT) 0.4 MG SL tablet Place 1 tablet (0.4 mg total) under the tongue every 5 (five) minutes x 3 doses as needed for chest pain. 25 tablet 1  . oxybutynin (DITROPAN) 5 MG tablet Start with 1/2 tablet two times daily 60 tablet 6  . PHOSPHA 250 NEUTRAL 155-852-130 MG tablet TAKE 1 TABLET BY MOUTH 2 (TWO) TIMES DAILY. 180 tablet 1  . polyethylene glycol (MIRALAX / GLYCOLAX) packet Take 17 g by mouth daily.    . potassium chloride SA (KLOR-CON M20) 20 MEQ tablet Take 1 tablet (20 mEq total) by mouth 2 (two) times daily. 60 tablet 6   No current facility-administered medications for this visit.    No Known Allergies  History   Social History  . Marital Status: Married    Spouse Name: cecil  . Number of Children: 2  . Years of Education:  N/A   Occupational History  . Retired Database administrator; Manufacturing engineer   Social History Main Topics  . Smoking status: Never Smoker   . Smokeless tobacco: Never Used  . Alcohol Use: No  . Drug Use: No  . Sexual Activity: Not on file   Other Topics Concern  . Not on file   Social History Narrative     Review of Systems: General: negative for chills, fever, night sweats or weight changes.  Cardiovascular: negative for chest pain, dyspnea on exertion, edema, orthopnea, palpitations, paroxysmal nocturnal dyspnea or shortness of breath Dermatological: negative for rash Respiratory: negative for cough or wheezing Urologic: negative for hematuria Abdominal: negative for  nausea, vomiting, diarrhea, bright red blood per rectum, melena, or hematemesis Neurologic: negative for visual changes, syncope, or dizziness All other systems reviewed and are otherwise negative except as noted above.    Blood pressure 122/60, pulse 39, height 5\' 1"  (1.549 m), weight 101 lb (45.813 kg).  General appearance: alert, cooperative and no distress Neck: no carotid bruit and no JVD Lungs: severe kyphoscoliosis Heart: regular rate and rhythm and slow rate Extremities: 1+ edema Neurologic: Grossly normal  EKG NSR, SB, RBBB  ASSESSMENT AND PLAN:   Chronic combined systolic and diastolic CHF, NYHA class 3 Stable, no CHF  Bradycardia Asymptomatic  Non-ST elevated myocardial infarction (non-STEMI) Medical Rx only-Oct 2015  Essential hypertension Controlled   PLAN  I reviewed her medications, she does not appear to be on any rate control agents. She is asymptomatic currently. I told her daughter to let us know right away or call 911 if her mother has syncope. F/U Dr Allyson Sabal is 6 months.   Dandy Lazaro K PA-C 06/21/2015 2:00 PM

## 2015-06-21 NOTE — Assessment & Plan Note (Signed)
Asymptomatic. 

## 2015-06-21 NOTE — Assessment & Plan Note (Signed)
Controlled.  

## 2015-06-24 ENCOUNTER — Other Ambulatory Visit: Payer: Self-pay | Admitting: Pulmonary Disease

## 2015-06-30 ENCOUNTER — Other Ambulatory Visit: Payer: Self-pay | Admitting: Pulmonary Disease

## 2015-07-11 ENCOUNTER — Telehealth: Payer: Self-pay | Admitting: Pulmonary Disease

## 2015-07-11 DIAGNOSIS — F0390 Unspecified dementia without behavioral disturbance: Secondary | ICD-10-CM

## 2015-07-11 NOTE — Telephone Encounter (Signed)
lmtcb for [place] with [facility] [employer]

## 2015-07-11 NOTE — Telephone Encounter (Signed)
Jessica [name] returning call and can be reached @ #[phone].Caren Griffins

## 2015-07-11 NOTE — Telephone Encounter (Signed)
lmtcb X2 for Rusty Aus, RN with [employer]

## 2015-07-11 NOTE — Telephone Encounter (Signed)
Sending to Dr. Kriste Basque just as fyi as she is his pt.  Pt had requested asap appt with any provider, is scheduled for tomorrow with MW.  Pt has been advised to seek emergency care in the meantime if her symptoms worsen.  Dr. Kriste Basque if you have any recs in the meantime please advise.  Thanks!

## 2015-07-11 NOTE — Telephone Encounter (Signed)
Per SN: ok to place med rec referral.  Order has been placed.   Left detailed message with Jae Dire at [facility] making her aware.  Nothing further needed at this time.

## 2015-07-11 NOTE — Telephone Encounter (Signed)
Spoke with pt and caregiver: pt c/o difficulty urinating X1 day.  Pt states she is able to void some but is concerned that she is not voiding enough.  Pt has no pain, no dark colored urine, no new med changes, is staying hydrated.  Pt requesting appt asap with provider. Scheduled for tomorrow with MW: advised pt to seek urgent or emergency care if she starts having pain or if any of her s/s worsen between then and now.  Pt and caregiver understand.  Nothing further needed at this time.

## 2015-07-11 NOTE — Telephone Encounter (Signed)
Per SN, see if pt can be seen at [facility] where she is already established.  Otherwise, proceed with ov tomorrow with MW>  [facility], states she was last seen in 01/2012.  A new consult appt would not be available until September.   Will keep appt with MW tomorrow.    Forwarding to SN as FYI.

## 2015-07-11 NOTE — Telephone Encounter (Signed)
Please call pt at home phone number listed in chart

## 2015-07-11 NOTE — Telephone Encounter (Signed)
Spoke with Jae Dire with [facility], requesting an order for med reconciliation through her home care company.   Dr. Kriste Basque are you ok with this order?  Thanks!

## 2015-07-12 ENCOUNTER — Encounter: Payer: Self-pay | Admitting: Internal Medicine

## 2015-07-12 ENCOUNTER — Ambulatory Visit (HOSPITAL_COMMUNITY)
Admission: RE | Admit: 2015-07-12 | Discharge: 2015-07-12 | Disposition: A | Discharge status: Home / Self Care | Payer: Medicare PPO | Source: Ambulatory Visit | Attending: Internal Medicine | Admitting: Internal Medicine

## 2015-07-12 ENCOUNTER — Ambulatory Visit (INDEPENDENT_AMBULATORY_CARE_PROVIDER_SITE_OTHER): Payer: Medicare PPO | Admitting: Internal Medicine

## 2015-07-12 ENCOUNTER — Other Ambulatory Visit (INDEPENDENT_AMBULATORY_CARE_PROVIDER_SITE_OTHER): Payer: Medicare PPO

## 2015-07-12 VITALS — BP 98/54 | HR 49 | Ht 63.0 in | Wt 106.0 lb

## 2015-07-12 DIAGNOSIS — N183 Chronic kidney disease, stage 3 unspecified: Secondary | ICD-10-CM

## 2015-07-12 DIAGNOSIS — R300 Dysuria: Secondary | ICD-10-CM | POA: Diagnosis present

## 2015-07-12 DIAGNOSIS — N323 Diverticulum of bladder: Secondary | ICD-10-CM | POA: Diagnosis not present

## 2015-07-12 DIAGNOSIS — R32 Unspecified urinary incontinence: Secondary | ICD-10-CM

## 2015-07-12 DIAGNOSIS — N390 Urinary tract infection, site not specified: Secondary | ICD-10-CM

## 2015-07-12 LAB — CBC WITH DIFFERENTIAL/PLATELET
BASOS PCT: 0.8 % (ref 0.0–3.0)
Basophils Absolute: 0 10*3/uL (ref 0.0–0.1)
Eosinophils Absolute: 0.2 10*3/uL (ref 0.0–0.7)
Eosinophils Relative: 3.2 % (ref 0.0–5.0)
HCT: 36.8 % (ref 36.0–46.0)
Hemoglobin: 11.7 g/dL — ABNORMAL LOW (ref 12.0–15.0)
Lymphocytes Relative: 18.2 % (ref 12.0–46.0)
Lymphs Abs: 1 10*3/uL (ref 0.7–4.0)
MCHC: 31.8 g/dL (ref 30.0–36.0)
MCV: 87.8 fl (ref 78.0–100.0)
Monocytes Absolute: 0.7 10*3/uL (ref 0.1–1.0)
Monocytes Relative: 12.8 % — ABNORMAL HIGH (ref 3.0–12.0)
Neutro Abs: 3.5 10*3/uL (ref 1.4–7.7)
Neutrophils Relative %: 65 % (ref 43.0–77.0)
Platelets: 230 10*3/uL (ref 150.0–400.0)
RBC: 4.19 Mil/uL (ref 3.87–5.11)
RDW: 15.4 % (ref 11.5–15.5)
WBC: 5.4 10*3/uL (ref 4.0–10.5)

## 2015-07-12 LAB — URINALYSIS, ROUTINE W REFLEX MICROSCOPIC
Bilirubin Urine: NEGATIVE
KETONES UR: NEGATIVE
Nitrite: NEGATIVE
PH: 6 (ref 5.0–8.0)
Specific Gravity, Urine: 1.02 (ref 1.000–1.030)
Total Protein, Urine: 30 — AB
URINE GLUCOSE: NEGATIVE
Urobilinogen, UA: 0.2 (ref 0.0–1.0)

## 2015-07-12 MED ORDER — CIPROFLOXACIN HCL 500 MG PO TABS
ORAL_TABLET | ORAL | Status: DC
Start: 2015-07-12 — End: 2015-10-24

## 2015-07-12 NOTE — Progress Notes (Signed)
Subjective:    Patient ID: Jessica [name], female    DOB: [birth year], 81 y.o.   MRN: [id number]  HPI 81 y/o BF here for a follow up visit...  She has mult medical problems including:  AR & runny nose;  Asthmatic Bronchitis;  HBP;  Chronic VI & edema;  Hyperchol;  GERD & esoph stricture;  Divertics;  Hx Urinary incontinence & UTIs;  DJD/ Osteoporosis;  Semile Dementia;  Anxiety...   ~  SEE PREV EPIC NOTES FOR EARLIER DATA >>   LABS 4/14:  Chems- Ca=11.2 but PTH is sl lower than prev at 86;  Phos=3.4 on KPhos1tabBid; K= 3.7 on this & K10- 2AM+1PM;  LFTs= wnl and Alb= 4.0 on Ensure...  Labs 7/14:  Chems- wnl x Ca=10.8, continue same meds...  LABS 11/14:  Chems- ok x K=3.2, BUN=24, Cr=1.1;  CBC w/ Hg=11.8;  TSH=2.48...  ~  March 15, 2014:  45moROV & Jessica [name] has the same chr complaints- generally stable, notes leaking bladder (prev eval DrTannenbaum), burning feet (saw Podiatry-DrRegal), occas indigestion... She [facility] & now she wants ENT referral to check her ears for lavage...     She notes that her breathing is fine- no recent resp exac, cough, sputum, ch in SOB, etc...    BP= 106/64 on Demadex20Qam & Diamox250Qpm w/ K10-2Bid and KPhos1Bid; see labs below...    She ambulates w/ cane & walker, denies CP/ palpit/ ch in edema...     She notes some urinary incont & wears depends, no burning etc...    She remains on Aricept5Bid- she prefers it this way & won't change... We reviewed prob list, meds, xrays and labs> see below for updates >> she did not bring med list or med bottles & reminded to bring bottles to every OV...   LABS 1/15:  Chems- ok w/ K=3.5, Cr=1.2  ~  May 20, 2014:  224moOV & Jessica [name] is stable, same chr complaints which she like to discuss Q2m67mo     HBP> on ASA81, Demadex20-2Qam, Diamox250-at 4pm, & K10-2Bid + KPhos-1Bid; BP=100/60 & labs 6/15 show K=4.0, Phos=2.5, Cr=1.1; continue same meds for now...    VI, edema> she  knows to elim sodium, elev legs, wear support hose, take Demadex & Diamox as directed...    Chol> on diet alone; she has a very hi HDL ~150 when measured in 2012...    Hypercalcemia> assoc w/ low phos- c/w primary hyperparathyroidism- she is 81y/o & not felt to be an operative candidate; we are following Ca, PO4, PTH levels...     GI- GERD, Divertics> off prev Prilosec20, Miralax, Senakot-S; advised these all as needed...    GU> saw DrTannenbaum for Urology 10/12- mult complaints, urodynamics done w/ sm hypersens bladder unstable w/ leakage, & large bladder divertic> he rec double voiding technique...    DJD, Osteopenia> off Ca supplements, on KPhos-2Bid, on MVI & encouraged to walk as much as poss...    Senile Dementia> on Aricept10 & Alpraz0.25 as needed... We reviewed prob list, meds, xrays and labs> see below for updates >>   LABS 6/15:  Chems- ok x Ca=11.2, PO4=2.5, PTH=108 c/w primary hyperpara;  CBC- Hg=12.3;  TSH=2.09...   ~  July 20, 2014:  22mo35mo & Jessica [name] stable overall- states that she has been doing fairly well but has a list of questions and wants me to re-order visiting nurses to check on her & husb every week or  so (I explained that we will re-order a home assessment by [facility] etc & see what she is eligible for under Medicare)...     1) she wants a nasal spray for drippy nose> try ASTELIN 1-2 sp in each nostril Bid as needed...    2) she is c/o burning discomfort in legs & we reviewed her Neuropathy Dx & the reason for the Gabapentin Rx- she is only taking 185m Bid & gets some benefit from this she says; offered to incr dose but she can't remember to take it Tid 7 dosen't want to incr to 2Bid so we decided to keep it the same for now...    3) she is c/o urinary symptoms- leaking, some incont, denies burning/ odor/ pain/ blood etc; we discussed checking UA & C&S; we reviewed prev evals from DrNesi & DrTannenbaum- offered ROV w/ these providers but she declined; finally agreed to  try Ditropan555m 1/2tab bid for her symptoms and let me know if she is better...    4) she wants f/u labs and is due for BMet & Phos level (f/u hyperpara on observation)...  We reviewed prob list, meds, xrays and labs> see below for updates >> she brought med bottles and we reviewed her Rx today...  LABS 8/15:  Chems- ok x K=3.1 and Cr=1.2;  Phos=2.9;  UA= clear...   ~  September 20, 2014:  3m30moV & post hosp visit> Jessica [name] Adm by [facility] 9/14 - 09/02/14 w/ SOB, she denied CP, EKG was unchanged but Troponins were elev, D-dimer was also abn & a CTAngioChest was done- neg for PE, and VenDopplers were neg for DVT; she was treated w/ Heparin transiently;  2DEcho showed decr LVF w/ EF=45-50% w/ areas of HK, & Gr1DD w/ mild AS/AI- felt to have prob anteroseptal MI w/ distal LAD lesion likely but she refused invasive eval & was made a NCB/ DNR/ med rx only;  Meds were adjusted w/ Hep=> Plavix added, BBlocker continued, and ACE added; [facility]  [facility] [place] [facility]...  She was disch home 10/2 w/ home healt PT/ OT/ CNA/ Nursing/ and Social worker all arranged...  After disch she returned to the ER 10/3 w/ paresthesias and weakness> seen by ER staff & Neuro consultant; BP was recorded at 180-190 sys, CT Brain & MRI were neg for acute infarct & she was felt to have had a TIA- already on Plavix & w/ nothing further to offer she was sent home...  She comes in today w/ daugh & appears weaker, BP is low at 92/64, marked increase in leg edema since off her diuretics (stopped during recent hosp w/ her NSTEMI), going downhill but ever stoic she says she is doing satis at home w/ BSC, walker, etc; they truly love the home health care assist w/ Pt/ OT/ HHN/ aides/ & social worker; they again decline to consider NHP for pt & her husband...  We reviewed the following medical problems during today's  office visit >>     HBP> prev on Demadex20-2Qam, Diamox250-at 4pm, & K10-2Bid + KPhos-1Bid; but now off all diuretics on MetopER25-1/2 daily, Lisinopril5 & KPhos-1Bid; BP=92/64 & no wiggle room to add back diuretics=> she will f/u w/ Cards soon...     ASHD, s/p NSTEMI, ischemic cardiomyopathy, combined sys & diast CHF> Adm 9/15 by [facility] w/ NSTEMI & combined sys/diast  CHF; she declined intervention; medical therapy adjusted- off diuretics, added MetopER12.5/ Lisin5/ ASA81/ Plavix75, but edema incr.    VI, edema> off diuretics now per Cards adjustment 9/15; she knows to elim sodium, elev legs, wear support hose; she has f/u appt w/ Cards soon...    Chol> on diet alone; she has a very hi HDL ~150 when measured in 2012...    Hypercalcemia> assoc w/ low phos- c/w primary hyperparathyroidism- she is 81y/o & not felt to be an operative candidate; we are following Ca, PO4, PTH levels...     GI- GERD, Divertics> off prev Prilosec20, Miralax, Senakot-S; advised these all as needed...    GU> saw DrTannenbaum for Urology 10/12- mult complaints, urodynamics done w/ sm hypersens bladder unstable w/ leakage, & large bladder divertic> he rec double voiding technique & Ditropan5-1/2Bid...    ?TIA, Neuropathy> on Neurontin100Tid; she went to the ER 10/15 one day after disch from the NH w/ numbness=> NEG CTBrain & MRI w/o acute changes, symptoms passed & sent home on same ASA81 + Plavix75/d...    DJD, Osteopenia> off Ca supplements, on KPhos-1Bid, on MVI & encouraged to walk as much as poss...    Senile Dementia> on Aricept5 & Alpraz0.25 as needed... We reviewed prob list, meds, xrays and labs> see below for updates >>   LABS 10/15:  FLP- ok on diet alone w/ HDL=159!  Chems- ok w/ Cr=1.01, K=4.1, BS=104, A1c=6.0, Ca=10.8;  CBC- Hg=11.7;  TSH=2.48...  CXR 9/15 showed mild cardiomeg, clear lungs, poor inspiration, osteopenia, scoliosis w/o change, NAD.Marland KitchenMarland Kitchen  EKG 9/15 showed SBrady, rate47, PACs, RBBB, left axis deviation,  NSSTTWA...   2DEcho 9/15 showed mild LVH, reduced LVF w/ EF=45-50% w/ HK in apex & septum, Gr1DD, mild AS/AI, mildly thickened MV leaflets & mild LAdil, PAsys=40...  CT Angio Chest 9/15 showed neg for PE, mildly enlarged cardiac chambers, Ao looked ok, COPD/E bilat w/ atx vs scarring at bases, scoliosis & DDD, ?nonobstructing stone in mid right kidney...  CT Head 10/15 showed mild diffuse atrophy, no acute changes...  MRI Head 10/15 revealed no infarct, hemorrhage, or mass; mild atrophy & sm vessel dis, NAD...  PLAN>> she has gained 19# since last here w/ marked edema (4+bilat in legs) since she's been off her diuretics; however BP is low at 92/64 on her low dose Metop & Lisin per Cards; advised no salt/ elev legs/ support hose/ & f/u w/ Cards ASAP as planned (not many options here- perhaps stop BBlocker, add diuretic, consider pacer);  She is happy w/ all the home health support...  ~  October 06, 2014:  2wk ROV & post-hosp visit> after last OV she was hosp by [facility] from 10/9 - 09/29/14 w/ incr SOB (O2sat=84% on RA), edema (BNP=9280, CXR w/ pulm edema), and weakness; but no CP or other symptoms and troponins were neg;  She was diuresed and her meds were adjusted (Metoprolol stopped due to low heart rate); disch to NHP for rehab and she is loving the attention "they are working with me"- on Lasix40 & KPhos bid, plus her ASA/ Plavix/ Lisin5...  Her CC is constipation> going every 3-4 days & firm, no blood reported; she is rec to start a regimen w/ Miralax daily & Senakot-S 2Qhs... We discussed rechecking her CXR (improved aeration) and LABS (K=3.1, HCO3=42, Cr=1.1, BNP=485) today... REC to continue Lasix40, Add back the Diamox250/d & the KCl- 31mq caps 2/d as before... ROV in 2-3 weeks...     We reviewed prob list, meds, xrays and  labs> see below for updates >>   CXR 10/15 Hosp showed cardiomeg, engorged pulm vasculature, increased markings, can't r/o effusions, all c/w pulm edema...   CXR  10/15 office f/u showed cardiomeg, improved aeration, persist bilat effusions...  LABS 10/15 office f/u>  Chems- K=3.1, HCO3=42, BUN=16, Cr=1.1, Ca=10.4, BNP=485...   ~  October 25, 2014:  3wk ROV & Jessica [name] is still in [employer] & to be released home in 3d her daughter says; advised to talk to [facility] staff about max home health support w/ visiting nurses, PT/ OT, etc; Phys therapy has checked her & the home situation & they feel she is safe for DC home; Daughter needs schooling on pt's home care needs, feeding, use of "thick-it", and wants to know "how to keep the pill box filled"...  Jessica [name] had [facility] f/u 10/22 w/ PA-Luke & they felt she was doing OK, no changes made, f/u 73mo  I note that the Diamox & KCl we tried to add after her last OV here is not on her list- ?what happened? Since she was here last she has lost another 5# but still has 2+edema in LEs, breathing is OK, she is avoiding salt she says;    Hx HBP, ASHD, s/pNSTEMI, ischemic cardiomyop (EF=45-50% w/ HK), combined sys&diast CHF, etc> on ASA81, Plavix75, Lisin5, Lasix40, KPhos2/d; Prev ordered Diamox250 & K10 are not on her list; BP= 122/64 & she denies ch in SOB, edema (2+), etc;  Labs w/ K=2.7, HCO3=40, Cr=1.1, BNP=311;  CXR w/ sm bilat effusions persist;  Plan is to add the Diamox250 one daily at 4PM, & KCl264mBid, continue other meds the same...     On Miralax, Senakot, Ensure> not taking PPI now; Hx GERD, Divertics; needs thick-it for her fluids per speech path & daughter must be taught...    On Ditropan5-1/2Bid for bladder...    On Neurontin100Tid for neuropathy...    On Aricept5 for memory & Alprazolam 0.25 prn nerves...  We reviewed prob list, meds, xrays and labs> see below for updates >> we reconciled med lists   CXR 11/15 showed small bilat effusions and rounded area ?fluid in fissure) on left lat chest...  LABS 11/15:  Chems- K=2.7, HCO3=40, Cr=1.1;  BNP=311... PLAN>>  Continue meds, we started KCl 2040mBid and added  DIAMOX250m22me tab at 4PM daily... ROV in 3 wks.  ~  November 28, 2014:  84mo 63mo& recheck> despite all efforts- conversation w/ daughter who brings pt to office/ son who lives w/ pt/ [facility] nurses from [facility] not taking the prescribed med[facility] properly, [facility]'t [facility] Lasix, Diamox, or the KCl!...   I discussed this w/ the daughter but she doesn't understand & called her brother, I talked to him on the phone and he doesn't know the meds or have interest in providing this service for his mother;  Finally I called [facility] [phone] to [place] them to increase services to MrsSolomon to whatever level is needed to provide the needed support 7 total supervision of her meds etc...    CV meds> ASA81, Plavix75, Lisinopril5, Lasix40Qam, Diamox250Qpm, KCl20Bid, KPhos-1Bid    Neuro meds> Aricept5Bid, Neurontin100Tid, Xanax0.25Tid prn    Others> Ditropan5-1/2Bid, Miralax daily, MVI, Ensure  ~  December 29, 2014:  84mo R78mo recheck> Jessica [name] w/ her son (first time I have met him) who does her meds; [facility] coming out but apparently NOT supervising her meds (no  notes or calls from them)- still done by son,placing pills in a weekly med box & checking to be sure she is taking it properly; they brought all her med bottles today- [facility] [employer] & prev filled 12/14 (date of last OV); unfortunately she has not lost any edema (still 3-4+ in both feet/ lower legs and her wt is the same as last month ~114#.  She feels the same- denies CP, palpit, SOB, dizzy, syncope, etc; remains weak, in wheelchair at 81y/o, & VS are stable w/ BP=100/52, pulse= 60/min, O2sat=94% on RA; Chest has diminished BS at bases bilat but no wheezing, rales, rhonchi; Cardiac exam unchanged w/ Gr1/6 SEM no rubs or gallops apprec...     We reviewed prob list, meds, xrays and labs> see below for updates >>   LABS 1/16:  Chems- ok w/ K=3.9,  BS=100, Cr=1.14, Po4=2.9.Marland KitchenMarland Kitchen  PLAN>> we are checking her metabolic labs to see if we can safely increase her Lasix dose to affect a better diuresis; in the meanwhile she is reminded to elim salt/ sodium for her diet, elev legs, wear support hose; given 2gm Na diet sheet; we plan ROV recheck in 22mo.. => Chems OK therefore increase Lasix4547m 2tabsQam.  ~  February 07, 2015:  47m65moV & she is here w/ her son> they assure me that she is taking her meds properly as outlined in her med list... She states that she is doing satis, stable, and she denies SOB; still wheelchair bound, difficulty getting up & difficulty w/ personal care needs; she feels that the swelling in feet/legs is about the same & weight is 113# down 1# today...    She saw DrBerry for [facility] 1/16> HBP, ASHD, s/pNSTEMI, ischemic cardiomyop w/ combined sys&diast CHF, etc> 2DEcho 9/15 showing mild LVH, reduced LVF w/ EF=45-50% w/ HK in apex & septum, Gr1DD, mild AS/AI, mildly thickened MV leaflets & mild LAdil, PAsys=40;  On ASA81, Plavix75, Lisin5, Lasix40-2Qam, Diamox250Qpm, KClHalburEdema looks about the same at 3+ in feet & lower legs, exam otherw unchanged and they did not add or change any meds, f/u planned 76mo22mo   We reviewed prob list, meds, xrays and labs> see below for updates >>  PLAN>>  We decided to continue same meds for now; reminded- NO SALT/ SODIUM in diet;  Wear support hose & keep legs up;  ROV in 6 weeks w/ CXR & lab work planned...  ~  March 21, 2015:  6wk ROV & [place] for a follow up visit w/ her son; she looks good & reports that she has been doing satis at home; they have help for her & husb several hours/d and this appears to be working for them; no new complaints or concerns; she has lost 2# down to 111# today & remains on Lasix40-2Qam, Diamox250 at4pm, KCl2Armadae has 2+edema in LEs w/o change 7 knows to avoid sodium etc...     BP=128/78 on above + Lisin5;  She remains on ASA81+Plavix75  for her ASHD, HxMI, ischemic cardiomyop & combined sys & diast CHF...    On Miralax, Ensure> not taking PPI now; Hx GERD, Divertics; needs thick-it for her fluids per speech path but refuses the fuss...    On Ditropan5-1/2Bid for bladder...    On Neurontin100Tid for neuropathy...    On Aricept10 for memory & Alprazolam 0.25 prn nerves...  We reviewed prob list, meds, xrays and labs> see below for updates >>  LABS 4/16:  Chems- ok w/ Cr=1.3 (incr from 1.1) and Ca=10.8 (incr from 10.4) w/ PO4=3.2;  CBC- anemic w/ Hg=10.3 (down from 12.8) & MCV=81...  Rec to start Klemme before dinner...   07/12/15 acute ov/Wert re: urinary retention on oxybutynin   Chief Complaint  Patient presents with  . Acute Visit    Pt c/o difficulty urinating for the past wk- able to void some but very scant. She states that she feels that she is drinking plenty of fluids.    Acute onset x one week dysuria/ sense she can't empty her bladder / no fever n or v or abd/ back pain   No obvious day to day or daytime variability or assoc sob chronic cough or cp or chest tightness, subjective wheeze or overt sinus or hb symptoms. No unusual exp hx or h/o childhood pna/ asthma or knowledge of premature birth.  Sleeping ok without nocturnal  or early am exacerbation  of respiratory  c/o's or need for noct saba. Also denies any obvious fluctuation of symptoms with weather or environmental changes or other aggravating or alleviating factors except as outlined above   Current Medications, Allergies, Complete Past Medical History, Past Surgical History, Family History, and Social History were reviewed in [employer] record.  ROS  The following are not active complaints unless bolded sore throat, dysphagia, dental problems, itching, sneezing,  nasal congestion or excess/ purulent secretions, ear ache,   fever, chills, sweats, unintended wt loss, classically pleuritic or exertional cp,  hemoptysis,  orthopnea pnd or leg swelling, presyncope, palpitations, abdominal pain, anorexia, nausea, vomiting, diarrhea  or change in bowel or bladder habits, change in stools  dysuria , hematuria,  rash, arthralgias, visual complaints, headache, numbness, weakness or ataxia or problems with walking or coordination,  change in mood/affect or memory.           Dr Lenna Gilford   Problem List:  Hx of ALLERGIES (ICD-995.3) - she uses OTC meds as needed, & we prev discussed Zyrtek 62mQam, Saline nasal mist, & Atrovent Nasal 0.03% Tid for drainage (one of her chronic complaints)... ~  8/15: we tries Astelin nasal spray for her chr complaints of dripping...  Hx of ASTHMATIC BRONCHITIS, ACUTE (ICD-466.0) - no recent exacerbations... ~  CXR 5/13 showed borderline heart size, kyphoscoliosis, no edema, and clear lungs... ~  CXR 10/13 showed normal heart size, clear lungs, kyphoscoliosis, osteopenia... ~  CXR 10/15> Hosp w/ CHF (cardiomeg, bilat effusions and interstitial edema); she was diuresed and disch to NHP/rehab=> f/u CXR 10/15 w/ improved aeration, persist bilat effusions... ~  CXR 11/15 showed small bilat effusions and rounded area ?fluid in fissure) on left lat chest.  HYPERTENSION (ICD-401.9) >>  ASHD, s/p NSTEMI, ischemic cardiomyopathy, combined sys & diast CHF>>  ~  controlled on ASA 819md, NORVASC5m71m/2tab, LISINOPRIL5 & DEMADEX 40m74m& DIAMOX250mg23m. ~  4/12:  BP= 110/60 and she states that she is taking her meds regularly & tolerating them well... denies HA, visual changes, CP, palpit, change in dyspnea, etc... but notes weakness, intermittently dizzy & persist edema in her feet...  ~  6/12:  BP= 124/70 & she remains stable... ~  8/12:  BP= 110/ 66 & stable... ~  10/12:  BP= 114/62 & stable... ~  1/13:  BP= 110/64 & she remains stable... ~  4/13:  BP= 116/58 & she is reminded to adjust Lasix 1-2 Qam according to her edema... ~  5/13:  BP= 140/72 & she is c/o  pedal edema; we decided to  change to Demadex 25m- 2Qam. ~  7/13:  BP= 110/60 & her edema is diminished, still 1+ in feet... Labs showed HCO3=39 & DIAMOX 2596md added. ~  8/13:  BP= 118/74 & edema is the same; ?what she's taking? & we decided to simplify regimen> Norvasc1/2, Lisin1/2, Demadex-1/d & Diamox-1/d... ~  9/13:  BP= 110/70 & she is clinically stable on these meds; add K10Bid for the K=3.4 today... ~  EKG 10/13 showed NSR, rate63, RBBB, poss old infarct anterolaterally... ~  11/13:  on ASA81, Amlod2.5, Lisin5, Demadex20-1/2, Diamox250, & K20/d; BP=122/64 & she is feeling better, still w/ mult somatic complaints... ~  12/13:  BP= 124/72 & her edema, weight, etc are about stable... ~  1/14:  BP= 118/60 & she persists w/ mult somatic complaints... ~  4/14:  on ASA81, Amlod2.5, Lisin5, Demadex20-1/2, Diamox250, & K10-3/d + KPhos-1Bid; BP=100/62 & she is feeling sl weak w/ mult somatic complaints; labs 4/14 show K=3.7, Phos=3.4, Cr=1.1; we decided to STOP the Amlodipine & work on sodium restriction ~  6/14: on ASA81, Lisiin5, Demadex20, Diamox250, & K10-3/d + KPhos-1Bid; BP= 110/68 & 3+edema persists; Labs- OK & we decided to STOP the Lisinopril & INCR the Demadex20-2Qam.... ~  7/14: on  ASA81, Demadex20-2/d, Diamox250, & K10-3/d + KPhos-1Bid; BP= 120/62 & she is improved w/ decr edema... ~  9/14:  BP remains well controlled on diuretics alone & measures 116/64 today.. ~  11/14: BP is controlled on ASA81, Demadex20-2Qam, Diamox250, & K10-3/d + KPhos-1Bid (off prev Amlod & Lisin); BP=122/70 & she denies angina pain, palpit, ch in SOB etc; K is sl low at 3.2 7 rec to incr KCl10 to 2Bid. ~  3/15: BP= 106/64 on Demadex20Qam & Diamox250Qpm w/ K10-2Bid and KPhos1Bid... ~  6/15:  on ASA81, Demadex20-2Qam, Diamox250-at 4pm, & K10-2Bid + KPhos-1Bid; BP=100/60 & labs 6/15 show K=4.0, Phos=2.5, Cr=1.1; continue same meds for now. ~  8/15: on ASA81, Demadex20-2Qam, Diamox250 at 4pm & K10-2/d;  BP= 120/62, continue same... ~  [facility]15:  [facility]w/ MI, cardiomyopathy w/ combined sys & diast CHF, & she refused invasive procedures/ cath etc; disch to [facility] rehab, then back home on ASA81, Plavix75, MetopER12.5, Lisin5, KPhos-Bid, & off all diuretics...  ~  2DEcho 9/15 showed mild LVH, reduced LVF w/ EF=45-50% w/ HK in apex & septum, Gr1DD, mild AS/AI, mildly thickened MV leaflets & mild LAdil, PAsys=40... ~  10/15: ret to office weaker w/ 19# wt gain & 4+edema off her diuretics w/ BP= 92/64, wt up 19# w/ 4+edema=> she was re-hosp w/ pulm edema (Adm 10/9 - 09/29/14), BNP=9280, given IV Lasix & med adjustment- disch on ASA81, Plavix75, Lasix40, Lisin5, KPos-Bid=> now w/ decr edema & BP=124/62 but f/u labs showed persist bilat pleural effusions, K=3.1, HCO3=42, Cr=1.1, BNP=485... REC to continue current meds, add Diamox250 at 4PM daily, add KCl-1044mcaps 2/d but this wasn't done by the NH... ~  11/15: She has lost another 5# & breathing is stable> CXR w/ persistent sm bilat effusions & ?fluid in fissure; Labs showed K=2.7, HCO3=40, Cr=1.1, & BNP=311; we decided to add the Diamox250 & K20Bid ~  12/15: it is apparent that she is not taking any of her meds since disch from the NH; daugh is clueless & son (who does her meds), doesn't know what they are or what they are for; [facility]amox, others. We discussed having visiting nurse supervision of meds & I  talked to [place]... ~  1/16: she is here w/ her son who is still doing her meds he says> they brought med bottles and [facility] taking Lisin5, Lasix40, Diamox250, KCl20Bid, KPhosBid; unfortunately her wt is the same at 114# & her edema remains 3-4+ in LEs; we will recheck labs & see if we can incr the Lasix dose=> Labs ok therefore incr Lasix40-2Qam. ~  2/16: on ASA81, Plavix75, Lisin5, Lasix40-2Qam, Diamox250Qpm, KCl20Bid & KPhosBid; edema about the same ~3+ & weight= 113# today; Rec to take meds regularly every day, no salt,  support hose, elev legs etc...  VENOUS INSUFFICIENCY, CHRONIC & EDEMA - on low sodium diet, elevation, support hose- prev refused diuretic meds due to urinary symptoms, then prev on [facility] which was stopped by [facility] in hosp 9/15... subseq gained 20# w/ 4+ pedal edema & re-hosp 10/15 w/ IV lasix & disch on oral Lasix but it is apparent that she did not fill the Rx & wasn't taking her meds... ~  1/16:  She is back on Lisin5, Lasix40, Diamox250, KCl20Bid, KPhosBid=> but she has not lost any wt or any of her pedal edema=> K & PO4 are wnl, Cr=1.14 therefore incr [id number] to 2Qam.  HYPERCHOLESTEROLEMIA (ICD-272.0) - prev on Lip10, but she stopped this on her own... she has a very high HDL!!! ~  FLP was 1/08 showed TChol 253, TG 125, HDL 121, LDL 93 ~  FLP 2/09 showed TChol 200, TG 99, HDL 136, LDL 44 ~  FLP 4/11 showed TChol 244, TG 143, HDL 149, LDL 59 ~  FLP 1/12 showed TChol 266, TG 102, HDL 151, LDL 83  ~  It has been hard to get her back in for FASTING labs... ~  Wray 9/15 in [place] on diet alone showed TChol 222, TG 61, HDL 159, LDL 51  HYPERCALCEMIA >> routine labs w/ Calcium levels betw 10.2 & 11.7 over the last 58yr... ~  Labs 7/13 showed Ca= 11.8 &  PTH= 81... For now avoid calc supplements etc... ~  Labs 9/13 showed Ca= 11.0 & she is reminded- no calcium, no VitD... ~  Labs 10/13 Hosp reviewed> Ca was up, Phos was low, K was low> all improved w/ Rx in hosp (they did not repeat PTH level). ~  4/14:   assoc w/ low phos- c/w primary hyperparathyroidism- she is 81y/o & not felt to be an operative candidate; [place] [facility]/ improvement in serum Phos to 3.6; serum Ca = 11.2 now but PTH is not rising.  ~  Labs 11/14 showed Ca=10.3 ~  Labs 1/15 showed Ca= 10.5 ~  6/15:  Labs showed  Ca=11.2, PO4=2.5, PTH=108 c/w primary hyperparathyroidism; we are following... ~  Labs 8/15 improved w/ Ca= 10.4, Phos= 2.9 ~  Labs 4/16 showe4d Ca= 10.8, PO4= 3.2  GERD  (ICD-530.81) - last EGD was 9/05 by DrPerry showing GERD and esoph stricture- dilated... she stopped her Nexium but states that her swallowing is good & she uses PRILOSEC OTC as needed. ~  4/16:  Labs showed Hg down to 10.3 w/ MCV= 81; requested to restart PRILOSEC20 before dinner & take FeSO4 3270mdaily in AM...  DIVERTICULOSIS OF COLON (ICD-562.10) - last colonoscopy was 11/00 showing divertics only... ~  8/12:  She notes some constipation & [place] protocol...  HX, URINARY INFECTION (ICD-V13.02) & INCONTINENCE symptoms... she has seen DrTannenbaum & staff on bladder exercises and  she reported some improvement but has persistant symptoms & never followed up... ~  7/10:  offered f/u appt w/ Urology vs second opinion consult but she declines... ~  9/10:  wrote for trial Enablex 7.33m/d >> no benefit she says. ~  12/10: saw DrNesi- tried sample med, sl improved, but "he turned me over to a lady for longer term treatments" she says... ~  4/12 & 6/12: states DrNesi hasn't been able to help her & she wants appt w/ DrTannenbaum==> seen w/ urodynamic eval revealing a sm hypersens bladder & a large bladder divertic; not a surg candidate, taught double voiding technique... ~  10/12: she had f/u DrTannenbaum & he reinforced prev w/u & need for double voiding technique... ~  10/13: In hosp Urine grew mult species, Rx w/ Roceph=> Cipro...  DEGENERATIVE JOINT DISEASE (ICD-715.90) - notes right shoulder symptoms, but managing OK w/ Mobic 7.572m& Tylenol; also right hip & low back pain...  she also c/o neuropathic "burning" in legs but not bad enough for additional meds she says... ~  8/14: [place] pt c/o bilat foot & ankle pain & burning w/ numbness & tingling; Dx w/ somatic dysfunction in lumbar region; subluxations seen on XRays; given an adjustment... ~  9/14: symptoms improved on Gabapentin 10066mTid  OSTEOPOROSIS (ICD-733.00) - she was on Fosamax but had discontinued this on her own... we discussed the need for continued therapy and she was asked to restart Alendronate but she never did...  she takes Ca++, MVI, VitD==> rec to STOP the Calcium.  SENILE DEMENTIA (ICD-290.0) - she takes ASA 68m32m.. she tried Aricept in the past but didn't notice any improvement therefore stopped it. ~  she & her husb still live on their own but are having numerous difficulties while still resisting any thoughts of assisted living etc> got concerned because she saw an impulse in her left antecubital fossa (tortuous brachial art), went to the ER & this got translated to palpitations & lead to full ER cardiac eval= neg... ~  6/11: MMSE showed 26/30 & she agreed to try DONEPEZIL 10mg19m. ~  6/12:  Still taking Aricept & appears stable... ~  7/13:  No change, same meds, clinically stable... ~  7/14:  She remains stable and will be 89 y63in Oct... ~  3/15:  She remains on Aricept5Bid- she prefers it this way & won't change...        Objective:   Physical Exam   WD, WN, elderly bf nad in w/c   Wt Readings from Last 3 Encounters:  07/12/15 106 lb (48.081 kg)  06/21/15 101 lb (45.813 kg)  05/29/15 110 lb (49.896 kg)    Vital signs reviewed  GENERAL:  Alert, pleasant & cooperative; she is slow moving  HEENT:  [place]/AT, EOM-full, EACs- some wax, TMs-wnl, NOSE-clear, THROAT-clear & wnl. NECK:  Supple w/ fairROM; no JVD; normal carotid impulses w/o bruits; no thyromegaly or nodules palpated; no lymphadenopathy. CHEST:  Clear to P & A; without wheezes/ rales/ or rhonchi., noted kyphosis... HEART:  Regular Rhythm; Gr1/6 SEM, without rubs or gallops heard... ABDOMEN:  Soft & nontender; normal bowel sounds; no organomegaly or masses detected./ No CVAT EXT:  mod arthritic changes; no varicose veins/ +venous insuffic/ 1-2+ pitting bilateral ankle edema  NEURO:  weak, no focal neuro deficits... DERM:  No  lesions noted; no rash etc...   Labs ordered 04/12/15 >>>  u/a bmet/ urine culture  Assessment & Plan:

## 2015-07-12 NOTE — Patient Instructions (Addendum)
cipro 500 mg one half twice daily x 5 days and drink plenty of fluids   Please see patient coordinator before you leave today  to schedule urology eval w/in a week   Please remember to go to the x-ray department downstairs for your tests - we will call you with the results when they are available. Late add try off oxybutinin as can cause urinary retention

## 2015-07-13 ENCOUNTER — Telehealth: Payer: Self-pay | Admitting: Pulmonary Disease

## 2015-07-13 ENCOUNTER — Encounter: Payer: Self-pay | Admitting: Internal Medicine

## 2015-07-13 ENCOUNTER — Telehealth: Payer: Self-pay | Admitting: *Deleted

## 2015-07-13 DIAGNOSIS — R39198 Other difficulties with micturition: Secondary | ICD-10-CM

## 2015-07-13 LAB — BASIC METABOLIC PANEL
BUN: 24 mg/dL — ABNORMAL HIGH (ref 6–23)
CALCIUM: 10.2 mg/dL (ref 8.4–10.5)
CHLORIDE: 104 meq/L (ref 96–112)
CO2: 29 mEq/L (ref 19–32)
CREATININE: 1.26 mg/dL — AB (ref 0.40–1.20)
GFR: 50.04 mL/min — AB (ref >60.00)
GLUCOSE: 89 mg/dL (ref 70–99)
Potassium: 3.9 mEq/L (ref 3.5–5.1)
SODIUM: 139 meq/L (ref 135–145)

## 2015-07-13 NOTE — Telephone Encounter (Signed)
lmtcb for Jessica [name], pt's EC.   Pt was seen by MW on 7.27.16 for acute visit and was referred to Urology. Per message taken earlier the referral must come from PCP - Dr. Kriste Basque.   SN please advise if ok to place order.

## 2015-07-13 NOTE — Telephone Encounter (Signed)
-----   Message from Nyoka Cowden, MD sent at 07/13/2015  8:57 AM EDT ----- Stop oxybutinin for now as may cause urinary retention

## 2015-07-13 NOTE — Assessment & Plan Note (Signed)
Renal u/s 07/12/2015 >>>  Minimal fullness of the right renal collecting system. Minimal perinephric fluid of uncertain etiology.  There is debris in the urinary bladder and somewhat greater degree within a urinary bladder diverticulum. The urinary bladder volume is measured at 160 mL. There is mild renal cortical thinning on the right and low normal renal cortical thickness on the left.  She says she voided w/in an hour of this study so clearly has urinary retention and may have uti  I had an extended discussion with the patient and daughter reviewing all relevant studies completed to date and  lasting 15 to 20 minutes of a 25 minute visit   rec try off oxybutinin and cipro 500 one half bid x 5 days pending results    Each maintenance medication was reviewed in detail including most importantly the difference between maintenance and prns and under what circumstances the prns are to be triggered using an action plan format that is not reflected in the computer generated alphabetically organized AVS.    Please see instructions for details which were reviewed in writing and the patient given a copy highlighting the part that I personally wrote and discussed at today's ov.

## 2015-07-13 NOTE — Telephone Encounter (Signed)
Called and spoke with the pt's son Tiburcio Bash (he is the one who helps the pt with meds) and informed him that pt needs to d/c ditropan for now  He verbalized understanding and nothing further needed

## 2015-07-13 NOTE — Telephone Encounter (Signed)
Per SN okay to place referral for urology for diff urinating The problem is that [facility] no longer recognizes SN as a PCP provider and she needs to find a new PCP that [place] recognizes . SN can still see pt for any pulmonary problems.  Called daughter and LMTCB x1

## 2015-07-14 LAB — URINE CULTURE: Colony Count: >=100000

## 2015-07-14 NOTE — Telephone Encounter (Signed)
Pt daughter returned call Lance Morin [phone]

## 2015-07-14 NOTE — Progress Notes (Signed)
Quick Note:  Spoke with pt and notified of results per Dr. Wert. Pt verbalized understanding and denied any questions.  ______ 

## 2015-07-14 NOTE — Telephone Encounter (Signed)
Called and spoke to pt's EC, Aram Beecham. Informed Aram Beecham of the recs per SN. Aram Beecham stated they are currently at [facility] and was informed by [facility] that they "will try and work something out" as pt needed to be seen. Aram Beecham denied needed a referral from SN at this time but stated she would call back if anything else is needed.

## 2015-07-14 NOTE — Telephone Encounter (Signed)
Pt daughter returned call [phone]

## 2015-07-14 NOTE — Telephone Encounter (Signed)
lmtcb for [employer].

## 2015-07-14 NOTE — Telephone Encounter (Signed)
LMTCB

## 2015-07-15 ENCOUNTER — Other Ambulatory Visit: Payer: Self-pay | Admitting: Pulmonary Disease

## 2015-07-18 ENCOUNTER — Encounter: Payer: Self-pay | Admitting: Internal Medicine

## 2015-07-18 NOTE — Assessment & Plan Note (Signed)
Lab Results  Component Value Date   CREATININE 1.26* 07/12/2015   CREATININE 1.31* 03/21/2015   CREATININE 1.14 12/29/2014     Adequate control on present rx, reviewed > no change in rx needed for now but will need urology f/u for urinary retention as risks recurrent uti/progressive post osbt uropathy> done

## 2015-07-21 ENCOUNTER — Other Ambulatory Visit: Payer: Self-pay | Admitting: Internal Medicine

## 2015-07-31 ENCOUNTER — Telehealth: Payer: Self-pay | Admitting: Pulmonary Disease

## 2015-07-31 NOTE — Telephone Encounter (Signed)
Spoke with Jae Dire at [facility]- requesting medication management and home health referral.  I advised that med management had been ordered through [facility] on 07/11/15 for this pt.  Jae Dire has requested I follow up on this order and leave a message for her to keep her updated. Called [facility] and spoke with Zella Ball, per Zella Ball she did not see this order in pt's chart.  Per Epic it looks like Synetta Fail handled this message that day.    PCC's please advise if this message was sent to [facility], and please refax if possible.  Thanks!

## 2015-07-31 NOTE — Telephone Encounter (Signed)
This was sent back to Dr Kriste Basque on 7/27 due to [facility] did not have a nursing staff to accommodate this need. Have not heard back from him. Have resent the info to Fleet Contras to see what to do.

## 2015-07-31 NOTE — Telephone Encounter (Signed)
Left message on voicemail for Jessica [name] to call back.

## 2015-07-31 NOTE — Telephone Encounter (Signed)
Called [employer]. To verify who would be in network with the patients insurance. They gave me the name of [facility] I called and verified that they could help the patient. I spoke with Delice Bison and faxed referral order, ins card, office notes to Fax # [phone].

## 2015-08-01 ENCOUNTER — Telehealth: Payer: Self-pay | Admitting: Pulmonary Disease

## 2015-08-01 ENCOUNTER — Ambulatory Visit: Payer: Medicare PPO | Admitting: Pulmonary Disease

## 2015-08-01 NOTE — Telephone Encounter (Signed)
Needed copy of pt's medication list. Faxed. Nothing further needed.

## 2015-08-02 ENCOUNTER — Telehealth: Payer: Self-pay | Admitting: Pulmonary Disease

## 2015-08-02 NOTE — Telephone Encounter (Signed)
Called spoke with [facility]. They did eval on pt for med reconciliation. No skill need noted. Pt had all her meds. son was there and told them exactly how pt takes her meds. Pt is weighing daily. They did not admit pt. No call back needed.

## 2015-08-09 ENCOUNTER — Ambulatory Visit: Payer: Medicare PPO | Admitting: Podiatry

## 2015-08-09 ENCOUNTER — Ambulatory Visit (INDEPENDENT_AMBULATORY_CARE_PROVIDER_SITE_OTHER): Payer: Medicare PPO | Admitting: Pulmonary Disease

## 2015-08-09 ENCOUNTER — Encounter: Payer: Self-pay | Admitting: Pulmonary Disease

## 2015-08-09 VITALS — BP 110/60 | HR 51 | Temp 97.6°F | Wt 94.0 lb

## 2015-08-09 DIAGNOSIS — F0390 Unspecified dementia without behavioral disturbance: Secondary | ICD-10-CM

## 2015-08-09 DIAGNOSIS — I4510 Unspecified right bundle-branch block: Secondary | ICD-10-CM

## 2015-08-09 DIAGNOSIS — N390 Urinary tract infection, site not specified: Secondary | ICD-10-CM

## 2015-08-09 DIAGNOSIS — N39498 Other specified urinary incontinence: Secondary | ICD-10-CM

## 2015-08-09 DIAGNOSIS — R531 Weakness: Secondary | ICD-10-CM

## 2015-08-09 DIAGNOSIS — I5042 Chronic combined systolic (congestive) and diastolic (congestive) heart failure: Secondary | ICD-10-CM

## 2015-08-09 DIAGNOSIS — I2510 Atherosclerotic heart disease of native coronary artery without angina pectoris: Secondary | ICD-10-CM | POA: Diagnosis not present

## 2015-08-09 DIAGNOSIS — I872 Venous insufficiency (chronic) (peripheral): Secondary | ICD-10-CM | POA: Diagnosis not present

## 2015-08-09 DIAGNOSIS — R609 Edema, unspecified: Secondary | ICD-10-CM

## 2015-08-09 MED ORDER — OXYBUTYNIN CHLORIDE 5 MG PO TABS: ORAL_TABLET | ORAL | Status: DC

## 2015-08-09 NOTE — Patient Instructions (Signed)
Today we updated your med list in our EPIC system...    Continue your current medications the same...  We decided to re-start the bladder medication OXYBUTYNIN 5mg  tabs- take 1/2 tab twice daily...  Call for any questions...  Let's plan a follow up visit in 45months time.Marland KitchenMarland Kitchen

## 2015-08-09 NOTE — Progress Notes (Signed)
Subjective:    Patient ID: Jessica [name], female    DOB: Apr 04, 1911, 81 y.o.   MRN: [id number]  HPI 81 y/o BF here for a follow up visit...  She has mult medical problems including:  AR & runny nose;  Asthmatic Bronchitis;  HBP;  Chronic VI & edema;  Hyperchol;  GERD & esoph stricture;  Divertics;  Hx Urinary incontinence & UTIs;  DJD/ Osteoporosis;  Semile Dementia;  Anxiety...   ~  SEE PREV EPIC NOTES FOR EARLIER DATA >>   LABS 4/14:  Chems- Ca=11.2 but PTH is sl lower than prev at 86;  Phos=3.4 on KPhos1tabBid; K= 3.7 on this & K10- 2AM+1PM;  LFTs= wnl and Alb= 4.0 on Ensure...  Labs 7/14:  Chems- wnl x Ca=10.8, continue same meds...  LABS 11/14:  Chems- ok x K=3.2, BUN=24, Cr=1.1;  CBC w/ Hg=11.8;  TSH=2.48...  LABS 1/15:  Chems- ok w/ K=3.5, Cr=1.2  LABS 6/15:  Chems- ok x Ca=11.2, PO4=2.5, PTH=108 c/w primary hyperpara;  CBC- Hg=12.3;  TSH=2.09...   LABS 8/15:  Chems- ok x K=3.1 and Cr=1.2;  Phos=2.9;  UA= clear...   ~  September 20, 2014:  67moROV & post hosp visit> [name] Adm by Cards 9/14 - 09/02/14 w/ SOB, she denied CP, EKG was unchanged but Troponins were elev, D-dimer was also abn & a CTAngioChest was done- neg for PE, and VenDopplers were neg for DVT; she was treated w/ Heparin transiently;  2DEcho showed decr LVF w/ EF=45-50% w/ areas of HK, & Gr1DD w/ mild AS/AI- felt to have prob anteroseptal MI w/ distal LAD lesion likely but she refused invasive eval & was made a NCB/ DNR/ med rx only;  Meds were adjusted w/ Hep=> Plavix added, BBlocker continued, and ACE added; [facility]  In the rehab facility she was managed by the [facility]their [facility] are reviewed...  She was disch home 10/2 w/ home healt PT/ OT/ CNA/ Nursing/ and Social worker all arranged...  After disch she returned to the ER 10/3 w/ paresthesias and weakness> seen by ER staff & Neuro consultant; BP was recorded at 180-190 sys, CT Brain & MRI  were neg for acute infarct & she was felt to have had a TIA- already on Plavix & w/ nothing further to offer she was sent home...  She comes in today w/ daugh & appears weaker, BP is low at 92/64, marked increase in leg edema since off her diuretics (stopped during recent hosp w/ her NSTEMI), going downhill but ever stoic she says she is doing satis at home w/ BSC, walker, etc; they truly love the home health care assist w/ Pt/ OT/ HHN/ aides/ & social worker; they again decline to consider NHP for pt & her husband...  We reviewed the following medical problems during today's office visit >>     HBP> prev on Demadex20-2Qam, Diamox250-at 4pm, & K10-2Bid + KPhos-1Bid; but now off all diuretics on MetopER25-1/2 daily, Lisinopril5 & KPhos-1Bid; BP=92/64 & no wiggle room to add back diuretics=> she will f/u w/ Cards soon...     ASHD, s/p NSTEMI, ischemic cardiomyopathy, combined sys & diast CHF> Adm 9/15 by Cards w/ NSTEMI & combined sys/diast CHF; she declined intervention; medical therapy adjusted- off diuretics, added MetopER12.5/ Lisin5/ ASA81/ Plavix75, but edema incr.    VI, edema> off diuretics now per Cards adjustment 9/15; she knows to elim sodium, elev legs, wear  support hose; she has f/u appt w/ Cards soon...    Chol> on diet alone; she has a very hi HDL ~150 when measured in 2012...    Hypercalcemia> assoc w/ low phos- c/w primary hyperparathyroidism- she is 81y/o & not felt to be an operative candidate; we are following Ca, PO4, PTH levels...     GI- GERD, Divertics> off prev Prilosec20, Miralax, Senakot-S; advised these all as needed...    GU> saw DrTannenbaum for Urology 10/12- mult complaints, urodynamics done w/ sm hypersens bladder unstable w/ leakage, & large bladder divertic> he rec double voiding technique & Ditropan5-1/2Bid...    ?TIA, Neuropathy> on Neurontin100Tid; she went to the ER 10/15 one day after disch from the NH w/ numbness=> NEG CTBrain & MRI w/o acute changes, symptoms passed &  sent home on same ASA81 + Plavix75/d...    DJD, Osteopenia> off Ca supplements, on KPhos-1Bid, on MVI & encouraged to walk as much as poss...    Senile Dementia> on Aricept5 & Alpraz0.25 as needed... We reviewed prob list, meds, xrays and labs> see below for updates >>   LABS 10/15:  FLP- ok on diet alone w/ HDL=159!  Chems- ok w/ Cr=1.01, K=4.1, BS=104, A1c=6.0, Ca=10.8;  CBC- Hg=11.7;  TSH=2.48...  CXR 9/15 showed mild cardiomeg, clear lungs, poor inspiration, osteopenia, scoliosis w/o change, NAD...  EKG 9/15 showed SBrady, rate47, PACs, RBBB, left axis deviation, NSSTTWA...   2DEcho 9/15 showed mild LVH, reduced LVF w/ EF=45-50% w/ HK in apex & septum, Gr1DD, mild AS/AI, mildly thickened MV leaflets & mild LAdil, PAsys=40...  CT Angio Chest 9/15 showed neg for PE, mildly enlarged cardiac chambers, Ao looked ok, COPD/E bilat w/ atx vs scarring at bases, scoliosis & DDD, ?nonobstructing stone in mid right kidney...  CT Head 10/15 showed mild diffuse atrophy, no acute changes...  MRI Head 10/15 revealed no infarct, hemorrhage, or mass; mild atrophy & sm vessel dis, NAD...  PLAN>> she has gained 19# since last here w/ marked edema (4+bilat in legs) since she's been off her diuretics; however BP is low at 92/64 on her low dose Metop & Lisin per Cards; advised no salt/ elev legs/ support hose/ & f/u w/ Cards ASAP as planned (not many options here- perhaps stop BBlocker, add diuretic, consider pacer);  She is happy w/ all the home health support...  ~  October 06, 2014:  2wk ROV & post-hosp visit> after last OV she was hosp by [facility] from 10/9 - 09/29/14 w/ incr SOB (O2sat=84% on RA), edema (BNP=9280, CXR w/ pulm edema), and weakness; but no CP or other symptoms and troponins were neg;  She was diuresed and her meds were adjusted (Metoprolol stopped due to low heart rate); disch to NHP for rehab and she is loving the attention "they are working with me"- on Lasix40 & KPhos bid, plus her ASA/  Plavix/ Lisin5...  Her CC is constipation> going every 3-4 days & firm, no blood reported; she is rec to start a regimen w/ Miralax daily & Senakot-S 2Qhs... We discussed rechecking her CXR (improved aeration) and LABS (K=3.1, HCO3=42, Cr=1.1, BNP=485) today... REC to continue Lasix40, Add back the Diamox250/d & the KCl- 10mEq caps 2/d as before... ROV in 2-3 weeks...     We reviewed prob list, meds, xrays and labs> see below for updates >>   CXR 10/15 Hosp showed cardiomeg, engorged pulm vasculature, increased markings, can't r/o effusions, all c/w pulm edema...   CXR 10/15 office f/u showed cardiomeg, improved aeration, persist   bilat effusions...  LABS 10/15 office f/u>  Chems- K=3.1, HCO3=42, BUN=16, Cr=1.1, Ca=10.4, BNP=485...   ~  October 25, 2014:  3wk ROV & Ashani is still in [facility] & to be released home in 3d her daughter says; advised to talk to NH staff about max home health support w/ visiting nurses, PT/ OT, etc; Phys therapy has checked her & the home situation & they feel she is safe for DC home; Daughter needs schooling on pt's home care needs, feeding, use of "thick-it", and wants to know "how to keep the pill box filled"...  Ysabella had Cards f/u 10/22 w/ PA-Luke & they felt she was doing OK, no changes made, f/u 3mo;  I note that the Diamox & KCl we tried to add after her last OV here is not on her list- ?what happened? Since she was here last she has lost another 5# but still has 2+edema in LEs, breathing is OK, she is avoiding salt she says;    Hx HBP, ASHD, s/pNSTEMI, ischemic cardiomyop (EF=45-50% w/ HK), combined sys&diast CHF, etc> on ASA81, Plavix75, Lisin5, Lasix40, KPhos2/d; Prev ordered Diamox250 & K10 are not on her list; BP= 122/64 & she denies ch in SOB, edema (2+), etc;  Labs w/ K=2.7, HCO3=40, Cr=1.1, BNP=311;  CXR w/ sm bilat effusions persist;  Plan is to add the Diamox250 one daily at 4PM, & KCl20mEqBid, continue other meds the same...     On Miralax,  Senakot, Ensure> not taking PPI now; Hx GERD, Divertics; needs thick-it for her fluids per speech path & daughter must be taught...    On Ditropan5-1/2Bid for bladder...    On Neurontin100Tid for neuropathy...    On Aricept5 for memory & Alprazolam 0.25 prn nerves...  We reviewed prob list, meds, xrays and labs> see below for updates >> we reconciled med lists   CXR 11/15 showed small bilat effusions and rounded area ?fluid in fissure) on left lat chest...  LABS 11/15:  Chems- K=2.7, HCO3=40, Cr=1.1;  BNP=311... PLAN>>  Continue meds, we started KCl 20mEq Bid and added DIAMOX250mg one tab at 4PM daily... ROV in 3 wks.  ~  November 28, 2014:  1mo ROV & recheck> despite all efforts- conversation w/ daughter who brings pt to office/ son who lives w/ pt/ visiting nurses from [employer]- Ameliah is still not taking the prescribed meds properly, [facility] [place]) confirms that she hasn't filled Lasix, Diamox, or the KCl!...   I discussed this w/ the daughter but she doesn't understand & called her brother, I talked to him on the phone and he doesn't know the meds or have interest in providing this service for his mother;  Finally I called [employer] [phone] & talked to [name] & asked them to increase services to MrsSolomon to whatever level is needed to provide the needed support 7 total supervision of her meds etc...    CV meds> ASA81, Plavix75, Lisinopril5, Lasix40Qam, Diamox250Qpm, KCl20Bid, KPhos-1Bid    Neuro meds> Aricept5Bid, Neurontin100Tid, Xanax0.25Tid prn    Others> Ditropan5-1/2Bid, Miralax daily, MVI, Ensure  ~  December 29, 2014:  1mo ROV & recheck> Racquelle is here w/ her son (first time I have met him) who does her meds; [employer] has been coming out but apparently NOT supervising her meds (no notes or calls from them)- still done by son,placing pills in a weekly med box & checking to be sure she is taking it properly; they brought all her med bottles today- [facility] & prev filled 12/14 (date of last OV); unfortunately she has not lost any edema (still 3-4+ in both feet/ lower legs and her wt is the same as last month ~114#.  She feels the same- denies CP, palpit, SOB, dizzy, syncope, etc; remains weak, in wheelchair at 81y/o, & VS are stable w/ BP=100/52, pulse= 60/min, O2sat=94% on RA; Chest has diminished BS at bases bilat but no wheezing, rales, rhonchi; Cardiac exam unchanged w/ Gr1/6 SEM no rubs or gallops apprec...     We reviewed prob list, meds, xrays and labs> see below for updates >>   LABS 1/16:  Chems- ok w/ K=3.9, BS=100, Cr=1.14, Po4=2.9...  PLAN>> we are checking her metabolic labs to see if we can safely increase her Lasix dose to affect a better diuresis; in the meanwhile she is reminded to elim salt/ sodium for her diet, elev legs, wear support hose; given 2gm Na diet sheet; we plan ROV recheck in 1mo... => Chems OK therefore increase Lasix40mg- 2tabsQam.  ~  February 07, 2015:  1mo ROV & she is here w/ her son> they assure me that she is taking her meds properly as outlined in her med list... She states that she is doing satis, stable, and she denies SOB; still wheelchair bound, difficulty getting up & difficulty w/ personal care needs; she feels that the swelling in feet/legs is about the same & weight is 113# down 1# today...    She saw DrBerry for Cards 1/16> HBP, ASHD, s/pNSTEMI, ischemic cardiomyop w/ combined sys&diast CHF, etc> 2DEcho 9/15 showing mild LVH, reduced LVF w/ EF=45-50% w/ HK in apex & septum, Gr1DD, mild AS/AI, mildly thickened MV leaflets & mild LAdil, PAsys=40;  On ASA81, Plavix75, Lisin5, Lasix40-2Qam, Diamox250Qpm, KCl20Bid & KPhosBid;  Edema looks about the same at 3+ in feet & lower legs, exam otherw unchanged and they did not add or change any meds, f/u planned 6mo...    We reviewed prob list, meds, xrays and labs> see below for updates >>  PLAN>>  We decided to continue same meds for now; reminded- NO SALT/ SODIUM  in diet;  Wear support hose & keep legs up;  ROV in 6 weeks w/ CXR & lab work planned...  ~  March 21, 2015:  6wk ROV & Tyneshia is here for a follow up visit w/ her son; she looks good & reports that she has been doing satis at home; they have help for her & husb several hours/d and this appears to be working for them; no new complaints or concerns; she has lost 2# down to 111# today & remains on Lasix40-2Qam, Diamox250 at4pm, KCl20Bid & KPhosBid- she has 2+edema in LEs w/o change 7 knows to avoid sodium etc...     BP=128/78 on above + Lisin5;  She remains on ASA81+Plavix75 for her ASHD, HxMI, ischemic cardiomyop & combined sys & diast CHF...    On Miralax, Ensure> not taking PPI now; Hx GERD, Divertics; needs thick-it for her fluids per speech path but refuses the fuss...    On Ditropan5-1/2Bid for bladder...    On Neurontin100Tid for neuropathy...    On Aricept10 for memory & Alprazolam 0.25 prn nerves...  We reviewed prob list, meds, xrays and labs> see below for updates >>   LABS 4/16:  Chems- ok w/ Cr=1.3 (incr from 1.1) and Ca=10.8 (incr from 10.4) w/ PO4=3.2;  CBC- anemic w/ Hg=10.3 (down from 12.8) & MCV=81...  Rec to start [id number] Qam & PRILOSEC20 before dinner... (NOTE>   I spent 25 min face-to-face time w/ pt & son reviewing her medical problems, medications, & treatment plan.)  ~  May 29, 2015:  2mo ROV & Lelia's situation remains the same- doing satis at home w/ son looking in & helping out; she has lost another 1# to 110# today; she has mult somatic complaints> not getting about well, post nasal drip, decr ROM right shoulder; she notes that her breathing is good...     HBP> on Lisinopril5, Lasix40-2Qam, Diamox250@4pm, K20Bid & KPhos1Bid; BP=100/58 & she has persistent edema, mult somatic complaints; she will f/u w/ Cards soon...     ASHD, s/p NSTEMI, ischemic cardiomyopathy, combined sys & diast CHF> Adm 9/15 by Cards w/ NSTEMI & combined sys/diast CHF; she declined intervention; followed  by DrBerry/Croitoru etal on above + ASA81/Plavix75; last seen 1/16 w/ f/u planned 6mo...     VI, edema> on Lasix80 & Diamox250; she knows to elim sodium, elev legs, wear support hose; she has f/u appt w/ Cards soon...    Chol> on diet alone; she has a very hi HDL ~150 when measured in 2012...    Hypercalcemia> assoc w/ low phos- c/w primary hyperparathyroidism- she is 81y/o & not felt to be an operative candidate; we are following Ca, PO4, PTH levels (non-progressive)...     GI- GERD, Divertics> off prev Prilosec20, Miralax, Senakot-S; advised these all as needed...    GU> saw DrTannenbaum for Urology 10/12- mult complaints, urodynamics done w/ sm hypersens bladder unstable w/ leakage, & large bladder divertic> he rec double voiding technique & Ditropan5-1/2Bid...    ?TIA, Neuropathy> on Neurontin100Tid; she went to the ER 10/15 one day after disch from the NH w/ numbness=> NEG CTBrain & MRI w/o acute changes, symptoms passed & sent home on same ASA81 + Plavix75/d...    DJD, Osteopenia> off Ca supplements, on KPhos-1Bid, on MVI & encouraged to walk as much as poss...    Senile Dementia> on Aricept10 & Alpraz0.25 as needed...    Anemia> on slow release iron tab daily... We reviewed prob list, meds, xrays and labs>>  IMP/PLAN>>  Zunairah is stable but very frail; her son does a good job supervising all meds and helping w/ their home care; needs to elim all sodium! She requests to continue follow up visits every 2mo...  ~  August 09, 2015:  2mo ROV & Shemica was seen 7/27 by DrWert w/ UTI- c/o difficulty voiding, no f/c/s/ etc; UA w/ TNTC wbc & bact; Cult= strep viridans; Abd Sonar w/ min fullness of right collecting system, bladder divertic containing debris, renal cort thinning, 2 left renal cysts; she was treated w/ Cipro & he stopped her Oxybutynin (see prev Urology eval by DrTannenbaum below)- UTI symptoms resolved but she feels stopping the anticholinergic hasn't helped her voiding & in light of her prev  eval we will restart the Oxybutynin5mg- 1/2Bid for her bladder instability; I offered referral to Urology but she prefers med trial 1st, consider further Urology eval if needed later...       Kathren also had Cards follow up 7/6> Hx HBP, CAD- s/p NSTEMI w/ chronic sys&diast CHF, Bradycardia; felt to be stable, no change in meds, they plan f/u 6mo...       She remains stable on ASA81/ Plavix75, Lisn5, Lasix40-2Qam, Diamox250-1Qpm, KCl20Bid, KPhos-1Bid;  BP= 110/60, pulse=52 reg, exam w/ gr1/6 SEM no r/g, ext w/ 2+ edema...  Labs 7/16 showed Chems- ok w/ K=3.9, BUN=24, Cr=1.26;  CBC- Hg=11.7, WBC=5.4;  UA- TNTC wbc and bact =>   treated w/ Cipro  Abd Sonar 07/12/15 w/ min fullness of right collecting system, bladder divertic containing debris, renal cort thinning, 2 left renal cysts.   IMP/PLAN>>  Clinically stable w/ mild urinary symptoms- no better off Oxybutynin, prev UTI rx'd w/ Cipro; offered referral to Urology but she prefers trial back on Oxybutynin5mg- 1/2Bid; continue other meds the same, encouraged phys activity 7 exercise as much as poss...          Problem List:  Hx of ALLERGIES (ICD-995.3) - she uses OTC meds as needed, & we prev discussed Zyrtek 10mgQam, Saline nasal mist, & Atrovent Nasal 0.03% Tid for drainage (one of her chronic complaints)... ~  8/15: we tries Astelin nasal spray for her chr complaints of dripping...  Hx of ASTHMATIC BRONCHITIS, ACUTE (ICD-466.0) - no recent exacerbations... ~  CXR 5/13 showed borderline heart size, kyphoscoliosis, no edema, and clear lungs... ~  CXR 10/13 showed normal heart size, clear lungs, kyphoscoliosis, osteopenia... ~  CXR 10/15> Hosp w/ CHF (cardiomeg, bilat effusions and interstitial edema); she was diuresed and disch to NHP/rehab=> f/u CXR 10/15 w/ improved aeration, persist bilat effusions... ~  CXR 11/15 showed small bilat effusions and rounded area ?fluid in fissure) on left lat chest.  HYPERTENSION (ICD-401.9) >>  ASHD, s/p NSTEMI,  ischemic cardiomyopathy, combined sys & diast CHF>>  ~  controlled on ASA 81mg/d, NORVASC5mg-1/2tab, LISINOPRIL5 & DEMADEX 20mg/d & DIAMOX250mg/d... ~  4/12:  BP= 110/60 and she states that she is taking her meds regularly & tolerating them well... denies HA, visual changes, CP, palpit, change in dyspnea, etc... but notes weakness, intermittently dizzy & persist edema in her feet...  ~  6/12:  BP= 124/70 & she remains stable... ~  8/12:  BP= 110/ 66 & stable... ~  10/12:  BP= 114/62 & stable... ~  1/13:  BP= 110/64 & she remains stable... ~  4/13:  BP= 116/58 & she is reminded to adjust Lasix 1-2 Qam according to her edema... ~  5/13:  BP= 140/72 & she is c/o pedal edema; we decided to change to Demadex 20mg- 2Qam. ~  7/13:  BP= 110/60 & her edema is diminished, still 1+ in feet... Labs showed HCO3=39 & DIAMOX 250mg/d added. ~  8/13:  BP= 118/74 & edema is the same; ?what she's taking? & we decided to simplify regimen> Norvasc1/2, Lisin1/2, Demadex-1/d & Diamox-1/d... ~  9/13:  BP= 110/70 & she is clinically stable on these meds; add K10Bid for the K=3.4 today... ~  EKG 10/13 showed NSR, rate63, RBBB, poss old infarct anterolaterally... ~  11/13:  on ASA81, Amlod2.5, Lisin5, Demadex20-1/2, Diamox250, & K20/d; BP=122/64 & she is feeling better, still w/ mult somatic complaints... ~  12/13:  BP= 124/72 & her edema, weight, etc are about stable... ~  1/14:  BP= 118/60 & she persists w/ mult somatic complaints... ~  4/14:  on ASA81, Amlod2.5, Lisin5, Demadex20-1/2, Diamox250, & K10-3/d + KPhos-1Bid; BP=100/62 & she is feeling sl weak w/ mult somatic complaints; labs 4/14 show K=3.7, Phos=3.4, Cr=1.1; we decided to STOP the Amlodipine & work on sodium restriction ~  6/14: on ASA81, Lisiin5, Demadex20, Diamox250, & K10-3/d + KPhos-1Bid; BP= 110/68 & 3+edema persists; Labs- OK & we decided to STOP the Lisinopril & INCR the Demadex20-2Qam.... ~  7/14: on  ASA81, Demadex20-2/d, Diamox250, & K10-3/d +  KPhos-1Bid; BP= 120/62 & she is improved w/ decr edema... ~  9/14:  BP remains well controlled on diuretics alone & measures 116/64 today.. ~    11/14: BP is controlled on ASA81, Demadex20-2Qam, Diamox250, & K10-3/d + KPhos-1Bid (off prev Amlod & Lisin); BP=122/70 & she denies angina pain, palpit, ch in SOB etc; K is sl low at 3.2 7 rec to incr KCl10 to 2Bid. ~  3/15: BP= 106/64 on Demadex20Qam & Diamox250Qpm w/ K10-2Bid and KPhos1Bid... ~  6/15:  on ASA81, Demadex20-2Qam, Diamox250-at 4pm, & K10-2Bid + KPhos-1Bid; BP=100/60 & labs 6/15 show K=4.0, Phos=2.5, Cr=1.1; continue same meds for now. ~  8/15: on ASA81, Demadex20-2Qam, Diamox250 at 4pm & K10-2/d;  BP= 120/62, continue same... ~  9/15: she was Hosp by Cards (Adm 9/14 - 09/02/14))w/ MI, cardiomyopathy w/ combined sys & diast CHF, & she refused invasive procedures/ cath etc; disch to [facility] rehab, then back home on ASA81, Plavix75, MetopER12.5, Lisin5, KPhos-Bid, & off all diuretics...  ~  2DEcho 9/15 showed mild LVH, reduced LVF w/ EF=45-50% w/ HK in apex & septum, Gr1DD, mild AS/AI, mildly thickened MV leaflets & mild LAdil, PAsys=40... ~  10/15: ret to office weaker w/ 19# wt gain & 4+edema off her diuretics w/ BP= 92/64, wt up 19# w/ 4+edema=> she was re-hosp w/ pulm edema (Adm 10/9 - 09/29/14), BNP=9280, given IV Lasix & med adjustment- disch on ASA81, Plavix75, Lasix40, Lisin5, KPos-Bid=> now w/ decr edema & BP=124/62 but f/u labs showed persist bilat pleural effusions, K=3.1, HCO3=42, Cr=1.1, BNP=485... REC to continue current meds, add Diamox250 at 4PM daily, add KCl-10mEq caps 2/d but this wasn't done by the NH... ~  11/15: She has lost another 5# & breathing is stable> CXR w/ persistent sm bilat effusions & ?fluid in fissure; Labs showed K=2.7, HCO3=40, Cr=1.1, & BNP=311; we decided to add the Diamox250 & K20Bid ~  12/15: it is apparent that she is not taking any of her meds since disch from the NH; daugh is clueless & son (who does her  meds), doesn't know what they are or what they are for; [facility]amox, others. We discussed having visiting nurse supervision of meds & I talked to [facility]... ~  1/16: she is here w/ her son who is still doing her meds he says> they brought med bottles and [facility] taking Lisin5, Lasix40, Diamox250, KCl20Bid, KPhosBid; unfortunately her wt is the same at 114# & her edema remains 3-4+ in LEs; we will recheck labs & see if we can incr the Lasix dose=> Labs ok therefore incr Lasix40-2Qam. ~  2/16: on ASA81, Plavix75, Lisin5, Lasix40-2Qam, Diamox250Qpm, KCl20Bid & KPhosBid; edema about the same ~3+ & weight= 113# today; Rec to take meds regularly every day, no salt, support hose, elev legs etc...  VENOUS INSUFFICIENCY, CHRONIC & EDEMA - on low sodium diet, elevation, support hose- prev refused diuretic meds due to urinary symptoms, then prev on DEMADEX which was stopped by Cards in hosp 9/15... subseq gained 20# w/ 4+ pedal edema & re-hosp 10/15 w/ IV lasix & disch on oral Lasix but it is apparent that she did not fill the Rx & wasn't taking her meds... ~  1/16:  She is back on Lisin5, Lasix40, Diamox250, KCl20Bid, KPhosBid=> but she has not lost any wt or any of her pedal edema=> K & PO4 are wnl, Cr=1.14 therefore incr Lsaix40 to 2Qam.  HYPERCHOLESTEROLEMIA (ICD-272.0) - prev on Lip10, but she stopped this on her own... she has a very high HDL!!! ~  FLP was 1/08 showed TChol 253, TG 125, HDL 121, LDL 93 ~  FLP 2/09 showed TChol 200, TG 99, HDL   136, LDL 44 ~  FLP 4/11 showed TChol 244, TG 143, HDL 149, LDL 59 ~  FLP 1/12 showed TChol 266, TG 102, HDL 151, LDL 83 >> BEST HDL I'VE EVER SEEN ~  It has been hard to get her back in for FASTING labs... ~  [place] 9/15 in [place] on diet alone showed TChol 222, TG 61, HDL 159, LDL 51  HYPERCALCEMIA >> routine labs w/ Calcium levels betw 10.2 & 11.7 over the last 56yr... ~  Labs 7/13 showed Ca= 11.8 &  PTH= 81... For now avoid calc  supplements etc... ~  Labs 9/13 showed Ca= 11.0 & she is reminded- no calcium, no VitD... ~  Labs 10/13 Hosp reviewed> Ca was up, Phos was low, K was low> all improved w/ Rx in hosp (they did not repeat PTH level). ~  4/14:   assoc w/ low phos- c/w primary hyperparathyroidism- she is 81y/o & not felt to be an operative candidate; she [place] w/ improvement in serum Phos to 3.6; serum Ca = 11.2 now but PTH is not rising.  ~  Labs 11/14 showed Ca=10.3 ~  Labs 1/15 showed Ca= 10.5 ~  6/15:  Labs showed  Ca=11.2, PO4=2.5, PTH=108 c/w primary hyperparathyroidism; we are following... ~  Labs 8/15 improved w/ Ca= 10.4, Phos= 2.9 ~  Labs 4/16 showe4d Ca= 10.8, PO4= 3.2  GERD (ICD-530.81) - last EGD was 9/05 by DrPerry showing GERD and esoph stricture- dilated... she stopped her Nexium but states that her swallowing is good & she uses PRILOSEC OTC as needed. ~  4/16:  Labs showed Hg down to 10.3 w/ MCV= 81; requested to restart PRILOSEC20 before dinner & take FeSO4 3267mdaily in AM...  DIVERTICULOSIS OF COLON (ICD-562.10) - last colonoscopy was 11/00 showing divertics only... ~  8/12:  She notes some constipation & rec to take MIInwooder our usual protocol...  HX, URINARY INFECTION (ICD-V13.02) & INCONTINENCE symptoms... she has seen DrTannenbaum & staff on bladder exercises and she reported some improvement but has persistant symptoms & never followed up... ~  7/10:  offered f/u appt w/ Urology vs second opinion consult but she declines... ~  9/10:  wrote for trial Enablex 7.25m38m >> no benefit she says. ~  12/10: saw DrNesi- tried sample med, sl improved, but "he turned me over to a lady for longer term treatments" she says... ~  4/12 & 6/12: states DrNesi hasn't been able to help her & she wants appt w/ DrTannenbaum==> seen w/ urodynamic eval revealing a sm hypersens bladder & a large bladder divertic; not a surg candidate, taught double voiding  technique... ~  10/12: she had f/u DrTannenbaum & he reinforced prev w/u & need for double voiding technique... ~  10/13: In hosp Urine grew mult species, Rx w/ Roceph=> Cipro...  DEGENERATIVE JOINT DISEASE (ICD-715.90) - notes right shoulder symptoms, but managing OK w/ Mobic 7.25mg64mTylenol; also right hip & low back pain...  she also c/o neuropathic "burning" in legs but not bad enough for additional meds she says... ~  8/14: [facility] [place] [facility] [place] pt c/o bilat foot & ankle pain & burning w/ numbness & tingling; Dx w/ somatic dysfunction in lumbar region; subluxations seen on XRays; given an adjustment... ~  9/14: symptoms improved on Gabapentin 100mg76m she says...  OSTEOPOROSIS (ICD-733.00) - she was on Fosamax but had discontinued this on  her own... we discussed the need for continued therapy and she was asked to restart Alendronate but she never did...  she takes Ca++, MVI, VitD==> rec to STOP the Calcium.  SENILE DEMENTIA (ICD-290.0) - she takes ASA 81mg/d... she tried Aricept in the past but didn't notice any improvement therefore stopped it. ~  she & her husb still live on their own but are having numerous difficulties while still resisting any thoughts of assisted living etc> got concerned because she saw an impulse in her left antecubital fossa (tortuous brachial art), went to the ER & this got translated to palpitations & lead to full ER cardiac eval= neg... ~  6/11: MMSE showed 26/30 & she agreed to try DONEPEZIL 10mg/d... ~  6/12:  Still taking Aricept & appears stable... ~  7/13:  No change, same meds, clinically stable... ~  7/14:  She remains stable and will be 81 y/o in Oct... ~  3/15:  She remains on Aricept5Bid- she prefers it this way & won't change...  ANXIETY (ICD-300.00) - she uses Alpraz 0.25mg as needed.  Hx of SHINGLES (ICD-053.9)   Past Surgical History  Procedure Laterality Date  . Vesicovaginal fistula closure w/ tah     . Tonsillectomy    . Abdominal hysterectomy      Outpatient Encounter Prescriptions as of 08/09/2015  Medication Sig  . acetaZOLAMIDE (DIAMOX) 250 MG tablet TAKE 1 TABLET (250 MG TOTAL) BY MOUTH DAILY. AT 4 PM  . ALPRAZolam (XANAX) 0.25 MG tablet Take 0.125-0.25 mg by mouth 3 (three) times daily as needed. For nerves  . aspirin EC 81 MG tablet Take 81 mg by mouth daily.  . clopidogrel (PLAVIX) 75 MG tablet TAKE 1 TABLET BY MOUTH EVERY DAY  . donepezil (ARICEPT) 10 MG tablet Take 1 tablet (10 mg total) by mouth at bedtime.  . Fe Bisgly-Vit C-Vit B12-FA (GENTLE IRON) 28-60-0.008-0.4 MG CAPS Take 1 capsule by mouth daily.  . feeding supplement (ENSURE COMPLETE) LIQD Take 237 mLs by mouth 2 (two) times daily between meals.  . furosemide (LASIX) 40 MG tablet TAKE 2 TABLETS BY MOUTH EVERY MORNING  . gabapentin (NEURONTIN) 100 MG capsule TAKE 1 CAPSULE (100 MG TOTAL) BY MOUTH 3 (THREE) TIMES DAILY.  . KLOR-CON M20 20 MEQ tablet TAKE 1 TABLET (20 MEQ TOTAL) BY MOUTH 2 (TWO) TIMES DAILY.  . lisinopril (PRINIVIL,ZESTRIL) 5 MG tablet Take 1 tablet (5 mg total) by mouth daily.  . Multiple Vitamin (MULTIVITAMIN) tablet Take 1 tablet by mouth daily.    . nitroGLYCERIN (NITROSTAT) 0.4 MG SL tablet Place 1 tablet (0.4 mg total) under the tongue every 5 (five) minutes x 3 doses as needed for chest pain.  . PHOSPHA 250 NEUTRAL 155-852-130 MG tablet TAKE 1 TABLET BY MOUTH 2 (TWO) TIMES DAILY.  . polyethylene glycol (MIRALAX / GLYCOLAX) packet Take 17 g by mouth daily.  . ciprofloxacin (CIPRO) 500 MG tablet One half twice daily x 5 days (Patient not taking: Reported on 08/09/2015)  . oxybutynin (DITROPAN) 5 MG tablet Start with 1/2 tablet two times daily  . [DISCONTINUED] oxybutynin (DITROPAN) 5 MG tablet Start with 1/2 tablet two times daily (Patient not taking: Reported on 08/09/2015)   No facility-administered encounter medications on file as of 08/09/2015.    No Known Allergies   Current Medications,  Allergies, Past Medical History, Past Surgical History, Family History, and Social History were reviewed in [facility] electronic medical record.    Review of Systems          See HPI - all other systems neg except as noted...      The patient complains of decreased hearing, dyspnea on exertion, peripheral edema, incontinence, muscle weakness, and difficulty walking.  The patient denies anorexia, fever, weight loss, weight gain, vision loss, hoarseness, chest pain, syncope, prolonged cough, headaches, hemoptysis, abdominal pain, melena, hematochezia, severe indigestion/heartburn, hematuria, suspicious skin lesions, transient blindness, depression, unusual weight change, abnormal bleeding, enlarged lymph nodes, and angioedema.     Objective:   Physical Exam      WD, WN, 81 y/o BF in NAD... GENERAL:  Alert, pleasant & cooperative; she is slow moving as noted... HEENT:  [place]/AT, EOM-full, EACs- some wax, TMs-wnl, NOSE-clear, THROAT-clear & wnl. NECK:  Supple w/ fairROM; no JVD; normal carotid impulses w/o bruits; no thyromegaly or nodules palpated; no lymphadenopathy. CHEST:  Clear to P & A; without wheezes/ rales/ or rhonchi., noted kyphosis... HEART:  Regular Rhythm; Gr1/6 SEM, without rubs or gallops heard... ABDOMEN:  Soft & nontender; normal bowel sounds; no organomegaly or masses detected. EXT:  mod arthritic changes; no varicose veins/ +venous insuffic/ 2+edema  NEURO:  weak, no focal neuro deficits... DERM:  No lesions noted; no rash etc...  MMSE> 06/13/10> Score 26/30 (missed place, 2/3 recall after distraction, & copy figure)...   Assessment & Plan:    ASHD, s/p NSTEMI, combined sys/diast CHF, VI, edema>   Hosp eval 10/15 by DrC ([facility]) she declined invasive options, treated medically- she will f/u w/ Cards regarding options avail...   =>  MEDS:  ASA81, Plavix75, Lisinopril5, Lasix40-2Qam, Diamox250Qpm, KCl20Bid, KPhos-1Bid 10/15> we tried to add Diamox250 & K10 but neither were  started as requested in the NH... 11/15> she is now home again, we have ordered Diamox250 & K20Bid to be added to her meds=> in follow up it was apparent that she never filled these Rxs. 12/15> Pt is not capable of doing her own meds, son who lives w/ pt & daughter who brings her to OVs are similarly incapable of providing this supervision, I have talked w/ [name] at [facility]. 1/16> she appears to be taking her meds properly at this point but still has 3-4+edema & wt w/o change; Labs improved, therefore incr Lasix to 80mgQam... 4/16> stable on meds w/ Lasix80, Diamox250, KCl & KPhos; continue same + no salt, elevation, support hose, etc... 6/16> stable on same meds, asked to elim all sodium etc...  AR & ASTHMA>  Breathing is stable, at baseline, she has chr rhinorrhea complaints w/ numerous discussions regarding management of this problem....  Ven Insuffic/ Edema>>  We have had mult conversations at each OV regarding no salt, elevation, support hose; she developed marked increase edema off the Demadex/ Diamox after hosp 9/15 and never filled the Lasix Rx after the 10/15 hosp; we are adjusting diuretics again as above...  CHOL>  She has the world's best HDL!!!  Electrolye Abn>  See 10/13 Hosp by [facility], it is apparent that her elev calcium is the result of HYPERPARATHYROIDISM but at age 89 we are reluctant to refer for surg unless progressive... We are following her serum Calcium level & non-progressive. Hypercalcemia>  Prob from Hyperpara & she has stopped the calcium supplements, & we are following the labs...  GU>  She saw DrTannenbaum w/ extensive investigation into her voiding symptoms & urodynamics as above; rec to use "double voiding" technique... 8/15> still complaining & declines ret to Urology; try Ditropan 5mg- 1/2 tab Bid... 7/16> she presented w/ UTI & voiding symptoms; DrWert treated w/ Cipro,   held Oxybutynin; Urine grew Viridans Strep, voiding symptoms persisted- rec to restart  Oxybut5mg- 1/2Bid...  DJD/ Osteopenia>  Aware, she is stable on OTC meds, she has repeatedly refuse Bisphos meds for her bones...  TIA, Dementia>  On ASA81, Plavix75; She & husb [name] are still living independently w/ daugh help but they are very fragile yet won't consider NHP...  Other medical problems as noted => 4/16 her Hg was down to 10.3 w/ MCV= 81; rec to start Prilosec20 before dinner & FeSO4 325mg daily in AM...   Patient's Medications  New Prescriptions   No medications on file  Previous Medications   ACETAZOLAMIDE (DIAMOX) 250 MG TABLET    TAKE 1 TABLET (250 MG TOTAL) BY MOUTH DAILY. AT 4 PM   ALPRAZOLAM (XANAX) 0.25 MG TABLET    Take 0.125-0.25 mg by mouth 3 (three) times daily as needed. For nerves   ASPIRIN EC 81 MG TABLET    Take 81 mg by mouth daily.   CIPROFLOXACIN (CIPRO) 500 MG TABLET    One half twice daily x 5 days   CLOPIDOGREL (PLAVIX) 75 MG TABLET    TAKE 1 TABLET BY MOUTH EVERY DAY   DONEPEZIL (ARICEPT) 10 MG TABLET    Take 1 tablet (10 mg total) by mouth at bedtime.   FE BISGLY-VIT C-VIT B12-FA (GENTLE IRON) 28-60-0.008-0.4 MG CAPS    Take 1 capsule by mouth daily.   FEEDING SUPPLEMENT (ENSURE COMPLETE) LIQD    Take 237 mLs by mouth 2 (two) times daily between meals.   FUROSEMIDE (LASIX) 40 MG TABLET    TAKE 2 TABLETS BY MOUTH EVERY MORNING   GABAPENTIN (NEURONTIN) 100 MG CAPSULE    TAKE 1 CAPSULE (100 MG TOTAL) BY MOUTH 3 (THREE) TIMES DAILY.   KLOR-CON M20 20 MEQ TABLET    TAKE 1 TABLET (20 MEQ TOTAL) BY MOUTH 2 (TWO) TIMES DAILY.   LISINOPRIL (PRINIVIL,ZESTRIL) 5 MG TABLET    Take 1 tablet (5 mg total) by mouth daily.   MULTIPLE VITAMIN (MULTIVITAMIN) TABLET    Take 1 tablet by mouth daily.     NITROGLYCERIN (NITROSTAT) 0.4 MG SL TABLET    Place 1 tablet (0.4 mg total) under the tongue every 5 (five) minutes x 3 doses as needed for chest pain.   PHOSPHA 250 NEUTRAL 155-852-130 MG TABLET    TAKE 1 TABLET BY MOUTH 2 (TWO) TIMES DAILY.   POLYETHYLENE GLYCOL  (MIRALAX / GLYCOLAX) PACKET    Take 17 g by mouth daily.  Modified Medications   Modified Medication Previous Medication   OXYBUTYNIN (DITROPAN) 5 MG TABLET oxybutynin (DITROPAN) 5 MG tablet      Start with 1/2 tablet two times daily    Start with 1/2 tablet two times daily  Discontinued Medications   No medications on file   

## 2015-08-11 ENCOUNTER — Encounter: Payer: Self-pay | Admitting: Internal Medicine

## 2015-08-11 ENCOUNTER — Ambulatory Visit (INDEPENDENT_AMBULATORY_CARE_PROVIDER_SITE_OTHER): Payer: Medicare PPO | Admitting: Internal Medicine

## 2015-08-11 VITALS — BP 130/60 | HR 45 | Temp 97.6°F | Resp 14 | Ht 65.0 in | Wt 110.0 lb

## 2015-08-11 DIAGNOSIS — Z23 Encounter for immunization: Secondary | ICD-10-CM

## 2015-08-11 DIAGNOSIS — I10 Essential (primary) hypertension: Secondary | ICD-10-CM

## 2015-08-11 DIAGNOSIS — F0390 Unspecified dementia without behavioral disturbance: Secondary | ICD-10-CM

## 2015-08-11 DIAGNOSIS — Z299 Encounter for prophylactic measures, unspecified: Secondary | ICD-10-CM

## 2015-08-11 DIAGNOSIS — Z418 Encounter for other procedures for purposes other than remedying health state: Secondary | ICD-10-CM

## 2015-08-11 DIAGNOSIS — N183 Chronic kidney disease, stage 3 unspecified: Secondary | ICD-10-CM

## 2015-08-11 DIAGNOSIS — R32 Unspecified urinary incontinence: Secondary | ICD-10-CM

## 2015-08-11 DIAGNOSIS — I5042 Chronic combined systolic (congestive) and diastolic (congestive) heart failure: Secondary | ICD-10-CM

## 2015-08-11 DIAGNOSIS — L609 Nail disorder, unspecified: Secondary | ICD-10-CM

## 2015-08-11 DIAGNOSIS — L602 Onychogryphosis: Secondary | ICD-10-CM

## 2015-08-11 DIAGNOSIS — I2510 Atherosclerotic heart disease of native coronary artery without angina pectoris: Secondary | ICD-10-CM

## 2015-08-11 MED ORDER — OXYBUTYNIN CHLORIDE 5 MG PO TABS: 5.0000 mg | ORAL_TABLET | Freq: Two times a day (BID) | ORAL | Status: DC

## 2015-08-11 NOTE — Patient Instructions (Signed)
We will send in the referral for the urologist and the foot doctor.   Increase the oxybutynin (ditropan) to 1 pill twice a day to see if this helps more.   Come back in about 6 months for a check up and please feel free to call the office sooner if you have any problems or questions.

## 2015-08-11 NOTE — Progress Notes (Signed)
Pre visit review using our clinic review tool, if applicable. No additional management support is needed unless otherwise documented below in the visit note. 

## 2015-08-11 NOTE — Progress Notes (Signed)
   Subjective:    Patient ID: Jessica [name], female    DOB: 01-07-1911, 81 y.o.   MRN: [id number]  HPI The patient is a 81 YO female coming in new for bladder incontinence. She is currently taking oxybutynin for it with some success. She does leak large volumes of urine without much warning. No pain with urination. No fevers or chills or confusion. Has been going on for years. Has a visit with urology in the future. Lives independently with help for cooking and cleaning. Able to do all ADLs. Good support system in the area.   PMH, [facility], social history reviewed and updated.   Review of Systems  Constitutional: Negative for fever, activity change, appetite change, fatigue and unexpected weight change.  HENT: Negative.   Eyes: Negative.   Respiratory: Negative for cough, chest tightness, shortness of breath and wheezing.   Cardiovascular: Positive for leg swelling. Negative for chest pain and palpitations.  Gastrointestinal: Negative for nausea, abdominal pain, diarrhea, constipation and abdominal distention.  Genitourinary: Positive for enuresis. Negative for dysuria, frequency and decreased urine volume.       Incontinence  Musculoskeletal: Positive for arthralgias.  Skin: Negative.   Neurological: Negative.   Psychiatric/Behavioral: Negative.       Objective:   Physical Exam  Constitutional: She is oriented to person, place, and time. She appears well-developed and well-nourished.  Slightly hard of hearing.   HENT:  Head: Normocephalic and atraumatic.  Eyes: EOM are normal.  Neck: Normal range of motion.  Cardiovascular: Normal rate and regular rhythm.   Pulmonary/Chest: Effort normal. No respiratory distress. She has no wheezes. She has no rales.  Abdominal: Soft. Bowel sounds are normal. She exhibits no distension. There is no tenderness. There is no rebound.  Neurological: She is alert and oriented to person, place, and time.  Skin: Skin is warm and dry.   Filed Vitals:   08/11/15 1313  BP: 130/60  Pulse: 45  Temp: 97.6 F (36.4 C)  TempSrc: Oral  Resp: 14  Height: 5\' 5"  (1.651 m)  Weight: 110 lb (49.896 kg)  SpO2: 96%      Assessment & Plan:  High dose flu and prevnar 13 given at visit.

## 2015-08-13 NOTE — Assessment & Plan Note (Signed)
BP at goal today on diamox, lasix, lisinopril. Checking labs and adjust as needed.

## 2015-08-13 NOTE — Assessment & Plan Note (Signed)
Not with fluid overload today and no exacerbation. She does take diamox, lasix, asa 81 mg, lisinopril and nitro prn.

## 2015-08-13 NOTE — Assessment & Plan Note (Signed)
Several previous heart attacks in the last several years. No chest pains currently and able to ambulate around her house.

## 2015-08-13 NOTE — Assessment & Plan Note (Signed)
Checking labs for stability, BP at goal today. Adjust regimen as needed.

## 2015-08-13 NOTE — Assessment & Plan Note (Signed)
Increase oxybutynin to 5 mg BID for now. If no results would try myrbetriq. She can keep the appointment with urology if she wishes.

## 2015-08-13 NOTE — Assessment & Plan Note (Signed)
On aricept 10 mg daily for now. Still able to do ADLs and tell her own history.

## 2015-08-15 ENCOUNTER — Telehealth: Payer: Self-pay | Admitting: Internal Medicine

## 2015-08-15 NOTE — Telephone Encounter (Signed)
error 

## 2015-08-25 ENCOUNTER — Encounter: Payer: Self-pay | Admitting: Podiatry

## 2015-08-25 ENCOUNTER — Ambulatory Visit (INDEPENDENT_AMBULATORY_CARE_PROVIDER_SITE_OTHER): Payer: Medicare PPO | Admitting: Podiatry

## 2015-08-25 DIAGNOSIS — B351 Tinea unguium: Secondary | ICD-10-CM | POA: Diagnosis not present

## 2015-08-25 DIAGNOSIS — M79676 Pain in unspecified toe(s): Secondary | ICD-10-CM

## 2015-08-25 NOTE — Progress Notes (Signed)
Patient ID: Jessica [name], female   DOB: 07/04/1911, 81 y.o.   MRN: [id number] Complaint:  Visit Type: Patient returns to my office for continued preventative foot care services. Complaint: Patient states" my nails have grown long and thick and become painful to walk and wear shoes.. The patient presents for preventative foot care services. No changes to ROS  Podiatric Exam: Vascular: dorsalis pedis and posterior tibial pulses are not  palpable bilateral due to swelling. Capillary return is immediate.  Sensorium: Normal Semmes Weinstein monofilament test. Normal tactile sensation bilaterally. Nail Exam: Pt has thick disfigured discolored nails with subungual debris noted bilateral entire nail hallux through fifth toenails Ulcer Exam: There is no evidence of ulcer or pre-ulcerative changes or infection. Orthopedic Exam: Muscle tone and strength are WNL. No limitations in general ROM. No crepitus or effusions noted. Foot type and digits show no abnormalities. Bony prominences are unremarkable. Skin: No Porokeratosis. No infection or ulcers  Diagnosis:  Onychomycosis, , Pain in right toe, pain in left toes  Treatment & Plan Procedures and Treatment: Consent by patient was obtained for treatment procedures. The patient understood the discussion of treatment and procedures well. All questions were answered thoroughly reviewed. Debridement of mycotic and hypertrophic toenails, 1 through 5 bilateral and clearing of subungual debris. No ulceration, no infection noted.  Return Visit-Office Procedure: Patient instructed to return to the office for a follow up visit 3 months for continued evaluation and treatment.

## 2015-09-09 ENCOUNTER — Other Ambulatory Visit: Payer: Self-pay | Admitting: Pulmonary Disease

## 2015-09-27 ENCOUNTER — Emergency Department (HOSPITAL_COMMUNITY)
Admission: EM | Admit: 2015-09-27 | Discharge: 2015-09-27 | Disposition: A | Discharge status: Home / Self Care | Payer: Medicare PPO | Attending: Emergency Medicine | Admitting: Emergency Medicine

## 2015-09-27 ENCOUNTER — Emergency Department (HOSPITAL_COMMUNITY): Payer: Medicare PPO

## 2015-09-27 ENCOUNTER — Encounter (HOSPITAL_COMMUNITY): Payer: Self-pay | Admitting: Emergency Medicine

## 2015-09-27 ENCOUNTER — Other Ambulatory Visit: Payer: Self-pay | Admitting: Pulmonary Disease

## 2015-09-27 DIAGNOSIS — Z7902 Long term (current) use of antithrombotics/antiplatelets: Secondary | ICD-10-CM | POA: Diagnosis not present

## 2015-09-27 DIAGNOSIS — R011 Cardiac murmur, unspecified: Secondary | ICD-10-CM | POA: Diagnosis not present

## 2015-09-27 DIAGNOSIS — N183 Chronic kidney disease, stage 3 unspecified: Secondary | ICD-10-CM | POA: Diagnosis present

## 2015-09-27 DIAGNOSIS — Z8673 Personal history of transient ischemic attack (TIA), and cerebral infarction without residual deficits: Secondary | ICD-10-CM | POA: Diagnosis not present

## 2015-09-27 DIAGNOSIS — Z8619 Personal history of other infectious and parasitic diseases: Secondary | ICD-10-CM | POA: Diagnosis not present

## 2015-09-27 DIAGNOSIS — R300 Dysuria: Secondary | ICD-10-CM | POA: Diagnosis present

## 2015-09-27 DIAGNOSIS — Z8639 Personal history of other endocrine, nutritional and metabolic disease: Secondary | ICD-10-CM | POA: Insufficient documentation

## 2015-09-27 DIAGNOSIS — Z79899 Other long term (current) drug therapy: Secondary | ICD-10-CM | POA: Insufficient documentation

## 2015-09-27 DIAGNOSIS — I4510 Unspecified right bundle-branch block: Secondary | ICD-10-CM | POA: Diagnosis not present

## 2015-09-27 DIAGNOSIS — F419 Anxiety disorder, unspecified: Secondary | ICD-10-CM | POA: Insufficient documentation

## 2015-09-27 DIAGNOSIS — F0390 Unspecified dementia without behavioral disturbance: Secondary | ICD-10-CM | POA: Diagnosis present

## 2015-09-27 DIAGNOSIS — Z8739 Personal history of other diseases of the musculoskeletal system and connective tissue: Secondary | ICD-10-CM | POA: Diagnosis not present

## 2015-09-27 DIAGNOSIS — N189 Chronic kidney disease, unspecified: Secondary | ICD-10-CM | POA: Diagnosis not present

## 2015-09-27 DIAGNOSIS — R7989 Other specified abnormal findings of blood chemistry: Secondary | ICD-10-CM | POA: Insufficient documentation

## 2015-09-27 DIAGNOSIS — Z7982 Long term (current) use of aspirin: Secondary | ICD-10-CM | POA: Diagnosis not present

## 2015-09-27 DIAGNOSIS — R32 Unspecified urinary incontinence: Secondary | ICD-10-CM | POA: Diagnosis present

## 2015-09-27 DIAGNOSIS — I129 Hypertensive chronic kidney disease with stage 1 through stage 4 chronic kidney disease, or unspecified chronic kidney disease: Secondary | ICD-10-CM | POA: Diagnosis not present

## 2015-09-27 DIAGNOSIS — Z87440 Personal history of urinary (tract) infections: Secondary | ICD-10-CM | POA: Insufficient documentation

## 2015-09-27 DIAGNOSIS — Z8719 Personal history of other diseases of the digestive system: Secondary | ICD-10-CM | POA: Diagnosis not present

## 2015-09-27 DIAGNOSIS — R627 Adult failure to thrive: Secondary | ICD-10-CM | POA: Diagnosis not present

## 2015-09-27 DIAGNOSIS — R001 Bradycardia, unspecified: Secondary | ICD-10-CM | POA: Diagnosis not present

## 2015-09-27 DIAGNOSIS — K219 Gastro-esophageal reflux disease without esophagitis: Secondary | ICD-10-CM | POA: Diagnosis present

## 2015-09-27 DIAGNOSIS — I5042 Chronic combined systolic (congestive) and diastolic (congestive) heart failure: Secondary | ICD-10-CM | POA: Diagnosis present

## 2015-09-27 DIAGNOSIS — E7800 Pure hypercholesterolemia, unspecified: Secondary | ICD-10-CM | POA: Diagnosis present

## 2015-09-27 DIAGNOSIS — R778 Other specified abnormalities of plasma proteins: Secondary | ICD-10-CM | POA: Diagnosis present

## 2015-09-27 DIAGNOSIS — I509 Heart failure, unspecified: Secondary | ICD-10-CM | POA: Insufficient documentation

## 2015-09-27 DIAGNOSIS — I10 Essential (primary) hypertension: Secondary | ICD-10-CM | POA: Diagnosis present

## 2015-09-27 LAB — COMPREHENSIVE METABOLIC PANEL
ALBUMIN: 3.2 g/dL — AB (ref 3.5–5.0)
ALK PHOS: 49 U/L (ref 38–126)
ALT: 12 U/L — ABNORMAL LOW (ref 14–54)
AST: 23 U/L (ref 15–41)
Anion gap: 8 (ref 5–15)
BILIRUBIN TOTAL: 0.3 mg/dL (ref 0.3–1.2)
BUN: 12 mg/dL (ref 6–20)
CALCIUM: 10.7 mg/dL — AB (ref 8.9–10.3)
CO2: 26 mmol/L (ref 22–32)
Chloride: 105 mmol/L (ref 101–111)
Creatinine, Ser: 1.08 mg/dL — ABNORMAL HIGH (ref 0.44–1.00)
GFR calc Af Amer: 47 mL/min — ABNORMAL LOW (ref >60)
GFR calc non Af Amer: 40 mL/min — ABNORMAL LOW (ref >60)
GLUCOSE: 78 mg/dL (ref 65–99)
POTASSIUM: 3.7 mmol/L (ref 3.5–5.1)
Sodium: 139 mmol/L (ref 135–145)
TOTAL PROTEIN: 6.5 g/dL (ref 6.5–8.1)

## 2015-09-27 LAB — URINALYSIS, ROUTINE W REFLEX MICROSCOPIC
Bilirubin Urine: NEGATIVE
GLUCOSE, UA: NEGATIVE mg/dL
Hgb urine dipstick: NEGATIVE
KETONES UR: NEGATIVE mg/dL
LEUKOCYTES UA: NEGATIVE
NITRITE: NEGATIVE
PROTEIN: NEGATIVE mg/dL
Specific Gravity, Urine: 1.013 (ref 1.005–1.030)
Urobilinogen, UA: 1 mg/dL (ref 0.0–1.0)
pH: 7 (ref 5.0–8.0)

## 2015-09-27 LAB — CBC WITH DIFFERENTIAL/PLATELET
BASOS ABS: 0 10*3/uL (ref 0.0–0.1)
BASOS PCT: 1 %
Eosinophils Absolute: 0 10*3/uL (ref 0.0–0.7)
Eosinophils Relative: 1 %
HEMATOCRIT: 37.6 % (ref 36.0–46.0)
HEMOGLOBIN: 11.8 g/dL — AB (ref 12.0–15.0)
Lymphocytes Relative: 15 %
Lymphs Abs: 0.7 10*3/uL (ref 0.7–4.0)
MCH: 27.8 pg (ref 26.0–34.0)
MCHC: 31.4 g/dL (ref 30.0–36.0)
MCV: 88.5 fL (ref 78.0–100.0)
Monocytes Absolute: 0.6 10*3/uL (ref 0.1–1.0)
Monocytes Relative: 14 %
NEUTROS ABS: 3.1 10*3/uL (ref 1.7–7.7)
Neutrophils Relative %: 70 %
Platelets: 286 10*3/uL (ref 150–400)
RBC: 4.25 MIL/uL (ref 3.87–5.11)
RDW: 13.1 % (ref 11.5–15.5)
WBC: 4.4 10*3/uL (ref 4.0–10.5)

## 2015-09-27 LAB — TROPONIN I: TROPONIN I: 0.7 ng/mL — AB (ref <0.031)

## 2015-09-27 MED ORDER — ASPIRIN 81 MG PO CHEW
324.0000 mg | CHEWABLE_TABLET | Freq: Once | ORAL | Status: AC
  Administered 2015-09-27: 324 mg via ORAL
  Filled 2015-09-27: qty 4

## 2015-09-27 NOTE — ED Notes (Signed)
Patient transported to X-ray 

## 2015-09-27 NOTE — Care Management Note (Signed)
Case Management Note  Patient Details  Name: Jessica [name] MRN: [id number] Date of Birth: [birth year]  Subjective/Objective:   Presented to [facility] ED with c/o weakness                 Action/Plan: CM received consult concerning recommendations for Home Hospice Services. Patient is 81 y.o with with a Past Medical History of chronic kidney disease stage III, combined systolic & diastolic heart failure with LVEF 45-50%, bradycardia; presented with complaints of burning with urination and generalized weakness. On presentation to the emergency room.  Met with patient and family at bedside to discussed the goals of care,  Support and provide comfort care, with symptom management without intervention for medical conditions, Patient is a DNR . Jessica [name] patient and daughter both agreeable. Provided list Home hospice list for [facility]. Patient and family selected [facility], referral called in to [phone].   Jessica Sarks RN Jessica [name] with [facility] meet with patient and family at bedside to perform Initial Home Hospice assessment. CM spoke with patient and family, after assessment, daughter states, that a nurse will be out to start service tomorrow 10/13. Said that equipment will be ordered by [facility] as needed. Information placed on AVS, Home Hospice brochure provided. Offered to transport patient by Jessica [name], daughter declined will transport patient in private vehicle. No further ED CM needs identified.  Expected Discharge Date:   09/27/15               Expected Discharge Plan:  Home w Hospice Care  In-House Referral:     Discharge planning Services  CM Consult  Post Acute Care Choice:    Choice offered to:  Adult Children Jessica [name] (daughter) [phone] or [phone] (c)/ Jessica [name] (son) [phone] or [phone] (c)  DME Arranged:   will be arranged by [facility] DME Agency:     HH Arranged:    [place]:  [facility]  Status of  Service:   completed  Medicare Important Message Given:    Date Medicare IM Given:    Medicare IM give by:    Date Additional Medicare IM Given:    Additional Medicare Important Message give by:     If discussed at [place] of Stay Meetings, dates discussed:    Additional CommentsLaurena Slimmer, RN 09/27/2015, 5:10 PM

## 2015-09-27 NOTE — ED Provider Notes (Addendum)
CSN: [id number]     Arrival date & time 09/27/15  1115 History   First MD Initiated Contact with Patient 09/27/15 1122     Chief Complaint  Patient presents with  . Dysuria     (Consider location/radiation/quality/duration/timing/severity/associated sxs/prior Treatment) HPI Comments:  Patient from home with dysuria since yesterday. This is by family report. Patient herself has no complaints. She is oriented to person and place. No recent fever or vomiting. She denies any pain. She denies any of breath, or change in bowel or bladder habits.  No focal weakness, numbness or tingling. She reports normal bowel movements. Denies any falls or recent injury. Found to be bradycardic with new right bundle-branch block per EMS.  The history is provided by the patient and the EMS personnel. The history is limited by the condition of the patient.    Past Medical History  Diagnosis Date  . Allergy, unspecified not elsewhere classified   . Hypertension   . Palpitations   . Chronic venous insufficiency   . Hypercholesteremia   . GERD (gastroesophageal reflux disease)   . Diverticulosis of colon   . History of urinary tract infection   . Urinary frequency   . Urinary incontinence   . Osteoporosis   . Neuropathy (HCC)   . Anxiety   . History of shingles   . Heart murmur   . Stroke [facility])     "evidence of light stroke/Dr Kriste Basque recently" (09/23/2014)  . DJD (degenerative joint disease)    Past Surgical History  Procedure Laterality Date  . Vesicovaginal fistula closure w/ tah    . Tonsillectomy    . Abdominal hysterectomy     Family History  Problem Relation Age of Onset  . Transient ischemic attack Father   . Pneumonia Sister    Social History  Substance Use Topics  . Smoking status: Never Smoker   . Smokeless tobacco: Never Used  . Alcohol Use: No   OB History    No data available     Review of Systems  Constitutional: Negative for fever, activity change and appetite change.   Respiratory: Negative for cough, chest tightness and shortness of breath.   Cardiovascular: Negative for chest pain and leg swelling.  Gastrointestinal: Negative for nausea, vomiting and abdominal pain.  Genitourinary: Positive for dysuria and hematuria. Negative for vaginal discharge.  Musculoskeletal: Negative for myalgias and arthralgias.  Skin: Negative for rash.  Neurological: Negative for dizziness, weakness and headaches.  A complete 10 system review of systems was obtained and all systems are negative except as noted in the HPI and PMH.      Allergies  Review of patient's allergies indicates no known allergies.  Home Medications   Prior to Admission medications   Medication Sig Start Date End Date Taking? Authorizing Provider  acetaZOLAMIDE (DIAMOX) 250 MG tablet TAKE 1 TABLET (250 MG TOTAL) BY MOUTH DAILY. AT 4 PM 06/26/15  Yes [name], MD  ALPRAZolam Prudy Feeler) 0.25 MG tablet Take 0.125-0.25 mg by mouth 3 (three) times daily as needed. For nerves   Yes Historical Provider, MD  aspirin EC 81 MG tablet Take 81 mg by mouth daily.   Yes Historical Provider, MD  clopidogrel (PLAVIX) 75 MG tablet TAKE 1 TABLET BY MOUTH EVERY DAY 06/07/15  Yes [name], MD  donepezil (ARICEPT) 10 MG tablet Take 1 tablet (10 mg total) by mouth at bedtime. 04/05/15  Yes [name], MD  furosemide (LASIX) 40 MG tablet TAKE 2 TABLETS  BY MOUTH EVERY MORNING 03/13/15  Yes [name], MD  gabapentin (NEURONTIN) 100 MG capsule TAKE ONE CAPSULE BY MOUTH 3 TIMES A DAY 09/11/15  Yes [name], MD  KLOR-CON M20 20 MEQ tablet TAKE 1 TABLET (20 MEQ TOTAL) BY MOUTH 2 (TWO) TIMES DAILY. 06/26/15  Yes [name], MD  lisinopril (PRINIVIL,ZESTRIL) 5 MG tablet Take 1 tablet (5 mg total) by mouth daily. 05/10/15  Yes Runell Gess, MD  Multiple Vitamin (MULTIVITAMIN) tablet Take 1 tablet by mouth daily.     Yes Historical Provider, MD  nitroGLYCERIN (NITROSTAT) 0.4 MG SL tablet Place 1 tablet (0.4 mg  total) under the tongue every 5 (five) minutes x 3 doses as needed for chest pain. 09/02/14  Yes Brittainy M Simmons, PA-C  PHOSPHA 250 NEUTRAL [id number] MG tablet TAKE 1 TABLET BY MOUTH 2 (TWO) TIMES DAILY. 07/17/15  Yes [name], MD  ciprofloxacin (CIPRO) 500 MG tablet One half twice daily x 5 days Patient not taking: Reported on 09/27/2015 07/12/15   Nyoka Cowden, MD  Fe Bisgly-Vit C-Vit B12-FA (GENTLE IRON) 28-60-0.008-0.4 MG CAPS Take 1 capsule by mouth daily. Patient not taking: Reported on 09/27/2015 05/29/15   [name], MD  feeding supplement (ENSURE COMPLETE) LIQD Take 237 mLs by mouth 2 (two) times daily between meals. Patient not taking: Reported on 09/27/2015 10/15/12   Rhetta Mura, MD  oxybutynin (DITROPAN) 5 MG tablet Take 1 tablet (5 mg total) by mouth 2 (two) times daily. Patient not taking: Reported on 09/27/2015 08/11/15   Myrlene Broker, MD  polyethylene glycol [facility] / Ethelene Hal) packet Take 17 g by mouth daily.    Historical Provider, MD   BP 107/90 mmHg  Pulse 45  Resp 20  SpO2 100% Physical Exam  Constitutional: She is oriented to person, place, and time. She appears well-developed and well-nourished. No distress.  HENT:  Head: Normocephalic and atraumatic.  Mouth/Throat: Oropharynx is clear and moist. No oropharyngeal exudate.  Eyes: Conjunctivae and EOM are normal. Pupils are equal, round, and reactive to light.  Neck: Normal range of motion. Neck supple.  No meningismus.  Cardiovascular: Normal rate, regular rhythm, normal heart sounds and intact distal pulses.   No murmur heard. Pulmonary/Chest: Effort normal and breath sounds normal. No respiratory distress. She exhibits no tenderness.  Abdominal: Soft. There is no tenderness. There is no rebound and no guarding.  Musculoskeletal: Normal range of motion. She exhibits no edema or tenderness.  Neurological: She is alert and oriented to person, place, and time. No cranial nerve deficit. She  exhibits normal muscle tone. Coordination normal.  No ataxia on finger to nose bilaterally. No pronator drift. 5/5 strength throughout. CN 2-12 intact.Equal grip strength. Sensation intact.   Skin: Skin is warm.  Psychiatric: She has a normal mood and affect. Her behavior is normal.  Nursing note and vitals reviewed.   ED Course  Procedures (including critical care time) Labs Review Labs Reviewed  CBC WITH DIFFERENTIAL/PLATELET - Abnormal; Notable for the following:    Hemoglobin 11.8 (*)    All other components within normal limits  COMPREHENSIVE METABOLIC PANEL - Abnormal; Notable for the following:    Creatinine, Ser 1.08 (*)    Calcium 10.7 (*)    Albumin 3.2 (*)    ALT 12 (*)    GFR calc non Af Amer 40 (*)    GFR calc Af Amer 47 (*)    All other components within normal limits  TROPONIN  I - Abnormal; Notable for the following:    Troponin I 0.70 (*)    All other components within normal limits  URINE CULTURE  URINALYSIS, ROUTINE W REFLEX MICROSCOPIC (NOT AT [facility])  TROPONIN I    Imaging Review Dg Chest 2 View  09/27/2015  CLINICAL DATA:  Weakness. EXAM: CHEST  2 VIEW COMPARISON:  October 25, 2014. FINDINGS: The heart size and mediastinal contours are within normal limits. Both lungs are clear. No pneumothorax or pleural effusion is noted. Narrowing of right subacromial space is noted consistent with rotator cuff injury. Moderate levoscoliosis of lower thoracic spine is noted. IMPRESSION: No active cardiopulmonary disease. Electronically Signed   By: Lupita Raider, M.D.   On: 09/27/2015 12:11   I have personally reviewed and evaluated these images and lab results as part of my medical decision-making.   EKG Interpretation   Date/Time:  Wednesday September 27 2015 11:22:04 EDT Ventricular Rate:  52 PR Interval:  200 QRS Duration: 152 QT Interval:  481 QTC Calculation: 447 R Axis:   -53 Text Interpretation:  Sinus rhythm RBBB and LAFB Lateral infarct, age   indeterminate Bifascicular blockConfirmed by Sanita Estrada  MD, Robert Sperl  [phone]) on 09/27/2015 11:37:03 AM      MDM   Final diagnoses:  NSTEMI (non-ST elevated myocardial infarction) (HCC)   patient from home with generalized weakness and fatigue. She denies any complaints. EMS and family concerned about UTI. He denies any chest pain, shortness of breath or abdominal pain.   Urinalysis is negative. EKG has right bundle-branch block similar previous. She denies any recent chest pain or shortness of breath.   Troponin elevated to 0.7. Aspirin given.  Discussed with cardiology who will consult recommended medical admission.   seen by Dr. Allyson Sabal of cardiology who does not feel this is cardiac in nature. He states that medical admission could be pursued.  [facility] with discuss with family.   Admission recommended.  Addendum:  After discussion with [facility] [place], family has decided they would like to take patient home and involve hospice. They understand that patient may be at risk for deterioration and death.  SW and CM to be involved. Dr. Verdie Mosher to disposition.    Glynn Octave, MD 09/27/15 1623  Glynn Octave, MD 09/27/15 [phone]

## 2015-09-27 NOTE — ED Notes (Signed)
Pt discharged with hospice to follow up in the home tomorrow.

## 2015-09-27 NOTE — Consult Note (Signed)
[facility] Medical Consultation  Jessica [name] GNF:[id number] DOB: 1911-07-29 DOA: 09/27/2015 PCP: Myrlene Broker, MD   Requesting physician: Dr. Manus Gunning Date of consultation: 10.12.2016 Reason for consultation: Consideration for admission.  Impression/Recommendations Principal Problem:   Elevated troponin Active Problems:   HYPERCHOLESTEROLEMIA   SENILE DEMENTIA   Essential hypertension   Urinary incontinence   RBBB   GERD (gastroesophageal reflux disease)   Chronic kidney disease (CKD), stage III (moderate)   Chronic combined systolic and diastolic CHF, NYHA class 3 (HCC)   Bradycardia    Elevated troponin (0.70) Patient asymptomatic. Denies chest pain. Denies shortness of breath. Patient with known systolic and diastolic heart failure. LVEF 45-50% in 2015. She had an NSTEMI in 2015. Right bundle branch block on EKG has been present since October 2015. Cardiology consulted. They advised against further ischemic workup.  Dysuria Urinary analysis negative for infection. Patient could try using over-the-counter AZO for symptom relief.  Chronic kidney disease stage III Creatinine at baseline.  Bradycardia Chronic. Stable.  Mixed systolic and diastolic heart failure Patient euvolemic. Stable.  Social I had a detailed conversation with the patient's daughter Lance Morin at bedside. I explained to Aram Beecham that her mother's heart may be under some strain but no further workup was advised by the cardiology specialist. I asked Aram Beecham if she would like [place] to admit her mother for observation. Aram Beecham decided against admitting her mother. She believes her mother would be more comfortable at home. I explained to Aram Beecham that her mother may pass, and asked if that happened would she want to be at home or in the hospital. Aram Beecham felt that her mother would want to be at home. I discussed this with the ER physician. An order was placed to social work/case  management for hospice services.  The Hospice liaison consulted with the patient and family in the ER prior to D/C.   Chief Complaint: Urinary burning  HPI: This is a 81 year old female with a history of chronic kidney disease, heart failure, bradycardia he was brought to the emergency department by her daughter for dysuria, increasing weakness, decreased appetite. Per Epic notes and history given by the patient's daughter Aram Beecham, the patient has had no fever, vomiting, shortness of breath, chest pain, changes in bowel habits. In the ER the patient's urinalysis was negative for infection. Her troponin 1 was elevated at 0.70. [facility] was asked to see the patient in consideration for admission.   Review of Systems:  Did not obtain a review of systems from this patient as she was sound asleep  Past Medical History  Diagnosis Date  . Allergy, unspecified not elsewhere classified   . Hypertension   . Palpitations   . Chronic venous insufficiency   . Hypercholesteremia   . GERD (gastroesophageal reflux disease)   . Diverticulosis of colon   . History of urinary tract infection   . Urinary frequency   . Urinary incontinence   . Osteoporosis   . Neuropathy (HCC)   . Anxiety   . History of shingles   . Heart murmur   . Stroke [facility])     "evidence of light stroke/Dr Kriste Basque recently" (09/23/2014)  . DJD (degenerative joint disease)    Past Surgical History  Procedure Laterality Date  . Vesicovaginal fistula closure w/ tah    . Tonsillectomy    . Abdominal hysterectomy     Social History:  reports that she has never smoked. She has never used smokeless tobacco. She reports that she does not  drink alcohol or use illicit drugs. lives at home with her son. Normally ambulates independently.  No Known Allergies  Family History  Problem Relation Age of Onset  . Transient ischemic attack Father   . Pneumonia Sister    mother passed at age 44.  Prior to Admission medications    Medication Sig Start Date End Date Taking? Authorizing Provider  acetaZOLAMIDE (DIAMOX) 250 MG tablet TAKE 1 TABLET (250 MG TOTAL) BY MOUTH DAILY. AT 4 PM 06/26/15  Yes [name], MD  ALPRAZolam Prudy Feeler) 0.25 MG tablet Take 0.125-0.25 mg by mouth 3 (three) times daily as needed. For nerves   Yes Historical Provider, MD  aspirin EC 81 MG tablet Take 81 mg by mouth daily.   Yes Historical Provider, MD  clopidogrel (PLAVIX) 75 MG tablet TAKE 1 TABLET BY MOUTH EVERY DAY 06/07/15  Yes [name], MD  donepezil (ARICEPT) 10 MG tablet Take 1 tablet (10 mg total) by mouth at bedtime. 04/05/15  Yes [name], MD  furosemide (LASIX) 40 MG tablet TAKE 2 TABLETS BY MOUTH EVERY MORNING 03/13/15  Yes [name], MD  gabapentin (NEURONTIN) 100 MG capsule TAKE ONE CAPSULE BY MOUTH 3 TIMES A DAY 09/11/15  Yes [name], MD  KLOR-CON M20 20 MEQ tablet TAKE 1 TABLET (20 MEQ TOTAL) BY MOUTH 2 (TWO) TIMES DAILY. 06/26/15  Yes [name], MD  lisinopril (PRINIVIL,ZESTRIL) 5 MG tablet Take 1 tablet (5 mg total) by mouth daily. 05/10/15  Yes Runell Gess, MD  Multiple Vitamin (MULTIVITAMIN) tablet Take 1 tablet by mouth daily.     Yes Historical Provider, MD  nitroGLYCERIN (NITROSTAT) 0.4 MG SL tablet Place 1 tablet (0.4 mg total) under the tongue every 5 (five) minutes x 3 doses as needed for chest pain. 09/02/14  Yes Brittainy M Simmons, PA-C  PHOSPHA 250 NEUTRAL [phone] MG tablet TAKE 1 TABLET BY MOUTH 2 (TWO) TIMES DAILY. 07/17/15  Yes [name], MD  ciprofloxacin (CIPRO) 500 MG tablet One half twice daily x 5 days Patient not taking: Reported on 09/27/2015 07/12/15   Nyoka Cowden, MD  Fe Bisgly-Vit C-Vit B12-FA (GENTLE IRON) 28-60-0.008-0.4 MG CAPS Take 1 capsule by mouth daily. Patient not taking: Reported on 09/27/2015 05/29/15   [name], MD  feeding supplement (ENSURE COMPLETE) LIQD Take 237 mLs by mouth 2 (two) times daily between meals. Patient not taking: Reported on 09/27/2015  10/15/12   Rhetta Mura, MD  oxybutynin (DITROPAN) 5 MG tablet Take 1 tablet (5 mg total) by mouth 2 (two) times daily. Patient not taking: Reported on 09/27/2015 08/11/15   Myrlene Broker, MD  polyethylene glycol [facility] / Ethelene Hal) packet Take 17 g by mouth daily.    Historical Provider, MD    Physical Exam: Filed Vitals:   09/27/15 1400 09/27/15 1415 09/27/15 1430 09/27/15 1542  BP: 145/60 136/53 136/56 107/90  Pulse: 46 46 44 45  Resp: SpO2: 100% 99% 99% 100%    Wt Readings from Last 3 Encounters:  08/11/15 49.896 kg (110 lb)  08/09/15 42.638 kg (94 lb)  07/12/15 48.081 kg (106 lb)    General:  Elderly female, sleeping and comfortable. I did not awaken. ENT: grossly normal hearing, lips & tongue Neck: no LAD, masses or thyromegaly Cardiovascular: Bradycardic, no m/r/g. No LE edema. Respiratory: CTA bilaterally, no w/r/r. Normal respiratory effort. Abdomen: soft, nt,nd Skin: no rash or induration seen on limited exam Musculoskeletal: Age-appropriate,  grossly normal tone BUE/BLE Psychiatric: Did not awaken sleeping patient Neurologic: Did not awaken sleeping patient     Labs on Admission:   Basic Metabolic Panel:  Recent Labs Lab 09/27/15 1316  NA 139  K 3.7  CL 105  CO2 26  GLUCOSE 78  BUN 12  CREATININE 1.08*  CALCIUM 10.7*   Liver Function Tests:  Recent Labs Lab 09/27/15 1316  AST 23  ALT 12*  ALKPHOS 49  BILITOT 0.3  PROT 6.5  ALBUMIN 3.2*   CBC:  Recent Labs Lab 09/27/15 1316  WBC 4.4  NEUTROABS 3.1  HGB 11.8*  HCT 37.6  MCV 88.5  PLT 286   Cardiac Enzymes:  Recent Labs Lab 09/27/15 1316  TROPONINI 0.70*    Radiological Exams on Admission: Dg Chest 2 View  09/27/2015  CLINICAL DATA:  Weakness. EXAM: CHEST  2 VIEW COMPARISON:  October 25, 2014. FINDINGS: The heart size and mediastinal contours are within normal limits. Both lungs are clear. No pneumothorax or pleural effusion is noted. Narrowing of  right subacromial space is noted consistent with rotator cuff injury. Moderate levoscoliosis of lower thoracic spine is noted. IMPRESSION: No active cardiopulmonary disease. Electronically Signed   By: Lupita Raider, M.D.   On: 09/27/2015 12:11    EKG: Independently reviewed. Sinus bradycardia, right bundle branch block  Time spent: 45 minutes  [place]ley [place] [facility] Pager [phone] If 7PM-7AM, please contact night-coverage [email or web address] Password [email or web address] 09/27/2015, 5:09 PM

## 2015-09-27 NOTE — Discharge Instructions (Signed)
You declined admission to the hospital today and requested hospice. Follow up with your doctor and hospice as scheduled. Return to the ED if you wish to be reevaluated.

## 2015-09-27 NOTE — ED Notes (Signed)
Hospice liaison at bedside speaking to pt and family

## 2015-09-27 NOTE — ED Notes (Signed)
Pt arrives via EMS from home with dysuria since yesterday. Pt c/o burning and odor. Denies recent fever. Pt arousable to voice, oriented x4, NAD at present. EMS noted new RBB on EKG. IV established 20g LFA.

## 2015-09-27 NOTE — Consult Note (Signed)
Patient ID: Jessica [name] MRN: [id number], DOB/AGE: [birth year]   Admit date: 09/27/2015   Primary Physician: Myrlene Broker, MD Primary Cardiologist: Dr. Allyson Sabal  Pt. Profile:  81 yo AA female with PMH of HTN, HLD, LE edema, GERD, asymptomatic bradycardia and chronic combined systolic and diastolic HF presented with dysuria. UA negative. Trop elevated at 0.7.  Problem List  Past Medical History  Diagnosis Date  . Allergy, unspecified not elsewhere classified   . Hypertension   . Palpitations   . Chronic venous insufficiency   . Hypercholesteremia   . GERD (gastroesophageal reflux disease)   . Diverticulosis of colon   . History of urinary tract infection   . Urinary frequency   . Urinary incontinence   . Osteoporosis   . Neuropathy (HCC)   . Anxiety   . History of shingles   . Heart murmur   . Stroke [facility])     "evidence of light stroke/Dr Kriste Basque recently" (09/23/2014)  . DJD (degenerative joint disease)     Past Surgical History  Procedure Laterality Date  . Vesicovaginal fistula closure w/ tah    . Tonsillectomy    . Abdominal hysterectomy       Allergies  No Known Allergies  HPI  The patient is a 81 yo AA female with PMH of HTN, HLD, LE edema, GERD, asymptomatic bradycardia and chronic combined systolic and diastolic HF. She was admitted in September 2015 with shortness breath. EKG at the time showed no acute changes, however troponin and d-dimer were elevated. CTA of the chest was negative for PE. Venous Doppler was negative for DVT. Echocardiogram showed mildly reduced LVEF 45-50% when compared to the previous echo, also new wall motion abnormalities and distal LAD artery distribution. It was felt she may had anterior septal MI with probable proximal LAD stenosis. However given her advanced age, medical therapy was recommended and she was subsequently transferred to skilled nursing facility. She was readmitted in October 2015 with heart failure  exacerbation. She has been DO NOT RESUSCITATE status. She is no longer on beta blocker due to history of bradycardia.  She was last seen in the office in July 2016, at which time she was doing well. She has not had any syncope or dizziness. She denies any dyspnea or chest discomfort. She presented to [facility]2015 with complaint of several days of dysuria. She denies any chest pain, shortness breath, lower extremity edema or paroxysmal nocturnal dyspnea. She does have some weakness for the past day and says her knees are weak. Family thought she may have had a UTI and brought her to [facility] for further evaluation. Her urinalysis was negative for UTI. Chest x-ray negative for acute process. EKG showed chronic right bundle branch block unchanged from last EKG in October 2015. Troponin was noted to be elevated at 0.7. Cardiology has been consulted for elevated troponin.   Home Medications  Prior to Admission medications   Medication Sig Start Date End Date Taking? Authorizing Provider  acetaZOLAMIDE (DIAMOX) 250 MG tablet TAKE 1 TABLET (250 MG TOTAL) BY MOUTH DAILY. AT 4 PM 06/26/15  Yes [name], MD  ALPRAZolam Prudy Feeler) 0.25 MG tablet Take 0.125-0.25 mg by mouth 3 (three) times daily as needed. For nerves   Yes Historical Provider, MD  aspirin EC 81 MG tablet Take 81 mg by mouth daily.   Yes Historical Provider, MD  clopidogrel (PLAVIX) 75 MG tablet TAKE 1 TABLET BY MOUTH EVERY DAY 06/07/15  Yes [name], MD  donepezil (ARICEPT) 10 MG tablet Take 1 tablet (10 mg total) by mouth at bedtime. 04/05/15  Yes [name], MD  furosemide (LASIX) 40 MG tablet TAKE 2 TABLETS BY MOUTH EVERY MORNING 03/13/15  Yes [name], MD  gabapentin (NEURONTIN) 100 MG capsule TAKE ONE CAPSULE BY MOUTH 3 TIMES A DAY 09/11/15  Yes [name], MD  KLOR-CON M20 20 MEQ tablet TAKE 1 TABLET (20 MEQ TOTAL) BY MOUTH 2 (TWO) TIMES DAILY. 06/26/15  Yes [name], MD  lisinopril  (PRINIVIL,ZESTRIL) 5 MG tablet Take 1 tablet (5 mg total) by mouth daily. 05/10/15  Yes Runell Gess, MD  Multiple Vitamin (MULTIVITAMIN) tablet Take 1 tablet by mouth daily.     Yes Historical Provider, MD  nitroGLYCERIN (NITROSTAT) 0.4 MG SL tablet Place 1 tablet (0.4 mg total) under the tongue every 5 (five) minutes x 3 doses as needed for chest pain. 09/02/14  Yes Brittainy M Simmons, PA-C  PHOSPHA 250 NEUTRAL [phone] MG tablet TAKE 1 TABLET BY MOUTH 2 (TWO) TIMES DAILY. 07/17/15  Yes [name], MD  ciprofloxacin (CIPRO) 500 MG tablet One half twice daily x 5 days Patient not taking: Reported on 09/27/2015 07/12/15   Nyoka Cowden, MD  Fe Bisgly-Vit C-Vit B12-FA (GENTLE IRON) 28-60-0.008-0.4 MG CAPS Take 1 capsule by mouth daily. Patient not taking: Reported on 09/27/2015 05/29/15   [name], MD  feeding supplement (ENSURE COMPLETE) LIQD Take 237 mLs by mouth 2 (two) times daily between meals. Patient not taking: Reported on 09/27/2015 10/15/12   Rhetta Mura, MD  oxybutynin (DITROPAN) 5 MG tablet Take 1 tablet (5 mg total) by mouth 2 (two) times daily. Patient not taking: Reported on 09/27/2015 08/11/15   Myrlene Broker, MD  polyethylene glycol [facility] Ethelene Hal) packet Take 17 g by mouth daily.    Historical Provider, MD    Family History  Family History  Problem Relation Age of Onset  . Transient ischemic attack Father   . Pneumonia Sister     Social History  Social History   Social History  . Marital Status: Married    Spouse Name: cecil  . Number of Children: 2  . Years of Education: N/A   Occupational History  . Retired Database administrator; Manufacturing engineer   Social History Main Topics  . Smoking status: Never Smoker   . Smokeless tobacco: Never Used  . Alcohol Use: No  . Drug Use: No  . Sexual Activity: Not on file   Other Topics Concern  . Not on file   Social History Narrative     Review of Systems General:  No  chills, fever, night sweats or weight changes.  Cardiovascular:  No chest pain, dyspnea on exertion, edema, orthopnea, palpitations, paroxysmal nocturnal dyspnea. Dermatological: No rash, lesions/masses Respiratory: No cough, dyspnea Urologic: No hematuria. +dysuria Abdominal:   No nausea, vomiting, diarrhea, bright red blood per rectum, melena, or hematemesis Neurologic:  No visual changes, changes in mental status. +wkns All other systems reviewed and are otherwise negative except as noted above.  Physical Exam  Blood pressure 136/56, pulse 44, resp. rate 19, SpO2 99 %.  General: Pleasant, NAD Psych: Normal affect. Neuro: Alert and oriented X 3. Moves all extremities spontaneously. HEENT: Normal  Neck: Supple without bruits or JVD. Lungs:  Resp regular and unlabored, CTA. Heart: RRR no s3, s4, or murmurs. Abdomen: Soft, non-tender, non-distended, BS +  x 4.  Extremities: No clubbing, cyanosis or edema. DP/PT/Radials 2+ and equal bilaterally.  Labs  Troponin (Point of Care Test) No results for input(s): TROPIPOC in the last 72 hours.  Recent Labs  09/27/15 1316  TROPONINI 0.70*   Lab Results  Component Value Date   WBC 4.4 09/27/2015   HGB 11.8* 09/27/2015   HCT 37.6 09/27/2015   MCV 88.5 09/27/2015   PLT 286 09/27/2015    Recent Labs Lab 09/27/15 1316  NA 139  K 3.7  CL 105  CO2 26  BUN 12  CREATININE 1.08*  CALCIUM 10.7*  PROT 6.5  BILITOT 0.3  ALKPHOS 49  ALT 12*  AST 23  GLUCOSE 78   Lab Results  Component Value Date   CHOL 222* 08/30/2014   HDL 159 08/30/2014   LDLCALC 51 08/30/2014   TRIG 61 08/30/2014   Lab Results  Component Value Date   DDIMER >20.00* 08/29/2014     Radiology/Studies  Dg Chest 2 View  09/27/2015  CLINICAL DATA:  Weakness. EXAM: CHEST  2 VIEW COMPARISON:  October 25, 2014. FINDINGS: The heart size and mediastinal contours are within normal limits. Both lungs are clear. No pneumothorax or pleural effusion is noted.  Narrowing of right subacromial space is noted consistent with rotator cuff injury. Moderate levoscoliosis of lower thoracic spine is noted. IMPRESSION: No active cardiopulmonary disease. Electronically Signed   By: Lupita Raider, M.D.   On: 09/27/2015 12:11    ECG  Chronic RBBB  Echocardiogram 08/31/2015  LV EF: 45% -  50%  ------------------------------------------------------------------- Indications:   Dyspnea 786.09.  ------------------------------------------------------------------- History:  PMH: Chronic kidney disease III. Senile dementia. Palpitations. Edema. RBBB. Dyspnea. Risk factors: Hypertension. Dyslipidemia.  ------------------------------------------------------------------- Study Conclusions  - Left ventricle: The cavity size was normal. Wall thickness was increased in a pattern of mild LVH. Systolic function was mildly reduced. The estimated ejection fraction was in the range of 45% to 50%. Hypokinesis of the mid-apicalanteroseptal and apical myocardium. Doppler parameters are consistent with abnormal left ventricular relaxation (grade 1 diastolic dysfunction). - Aortic valve: There was mild stenosis. There was mild regurgitation. Valve area (VTI): 1.58 cm^2. Valve area (Vmax): 1.45 cm^2. Valve area (Vmean): 1.7 cm^2. - Mitral valve: Severely calcified annulus. Mildly thickened leaflets . Valve area by pressure half-time: 1.34 cm^2. Valve area by continuity equation (using LVOT flow): 1.22 cm^2. - Left atrium: The atrium was mildly dilated. - Pulmonary arteries: Systolic pressure was mildly increased. PA peak pressure: 40 mm Hg (S).  Impressions:  - Compared to 2010, there is a new wall motion abnormality in the distal LAD artery distribution and mildly reduced overall LVEF.     ASSESSMENT AND PLAN  1. Elevated troponin 0.7  - otherwise denies any CP or SOB, came to ED for dysuria and weakness. RBBB is chronic since at  least Oct 2015.   - given her age and lack of chest pain, would not do further ischemic workup  2. Dysuria: negative UA  3. chronic combined systolic and diastolic HF  - euvolemic 4. asymptomatic bradycardia: chronic, off BB  5. HTN 6. HLD 7. LE edema 8. GERD  Signed, Azalee Course, Cordelia Poche 09/27/2015, 3:33 PM  Agree with note by Azalee Course PA-C  Pt well known to me. We are asked to see for minimally + trop .7. No H/O CAD. Mild -mod LV dysf. Pt came to ER with dysuria. UA neg. Denies CP/SOB. EKG SB with RBBB old. Exam benign. I am not  concerned about ACS. No further cardiac w/u required. Medicine service can admit if necessary. Call if we can be of further assistance. Runell Gess, M.D., FACP, [facility], Earl Lagos [facility] [address] [place], [place]  [address]  [phone] 09/27/2015 4:08 PM

## 2015-09-28 ENCOUNTER — Telehealth: Payer: Self-pay | Admitting: Internal Medicine

## 2015-09-28 DIAGNOSIS — R001 Bradycardia, unspecified: Secondary | ICD-10-CM | POA: Diagnosis not present

## 2015-09-28 DIAGNOSIS — I2510 Atherosclerotic heart disease of native coronary artery without angina pectoris: Secondary | ICD-10-CM | POA: Diagnosis not present

## 2015-09-28 DIAGNOSIS — I519 Heart disease, unspecified: Secondary | ICD-10-CM | POA: Diagnosis not present

## 2015-09-28 DIAGNOSIS — G309 Alzheimer's disease, unspecified: Secondary | ICD-10-CM | POA: Diagnosis not present

## 2015-09-28 DIAGNOSIS — I509 Heart failure, unspecified: Secondary | ICD-10-CM | POA: Diagnosis not present

## 2015-09-28 DIAGNOSIS — N189 Chronic kidney disease, unspecified: Secondary | ICD-10-CM | POA: Diagnosis not present

## 2015-09-28 DIAGNOSIS — I10 Essential (primary) hypertension: Secondary | ICD-10-CM | POA: Diagnosis not present

## 2015-09-28 LAB — URINE CULTURE: Culture: NO GROWTH

## 2015-09-28 NOTE — Telephone Encounter (Signed)
Yes, fine

## 2015-09-28 NOTE — Telephone Encounter (Signed)
Evette from Hospice states pt was discharged from hospital and suggested she be placed with hospice. She is needing to know if Dr. Okey Dupre will be attending and they feel the need to get over to pt this morning. Can you please call her at [phone] ASAP

## 2015-09-28 NOTE — Telephone Encounter (Signed)
Notified Hospice spoke with Chip Boer gave md response...Raechel Chute

## 2015-09-29 DIAGNOSIS — I519 Heart disease, unspecified: Secondary | ICD-10-CM | POA: Diagnosis not present

## 2015-09-29 DIAGNOSIS — I509 Heart failure, unspecified: Secondary | ICD-10-CM | POA: Diagnosis not present

## 2015-09-29 DIAGNOSIS — R001 Bradycardia, unspecified: Secondary | ICD-10-CM | POA: Diagnosis not present

## 2015-09-29 DIAGNOSIS — I10 Essential (primary) hypertension: Secondary | ICD-10-CM | POA: Diagnosis not present

## 2015-09-29 DIAGNOSIS — N189 Chronic kidney disease, unspecified: Secondary | ICD-10-CM | POA: Diagnosis not present

## 2015-09-29 DIAGNOSIS — I2510 Atherosclerotic heart disease of native coronary artery without angina pectoris: Secondary | ICD-10-CM | POA: Diagnosis not present

## 2015-10-01 DIAGNOSIS — I519 Heart disease, unspecified: Secondary | ICD-10-CM | POA: Diagnosis not present

## 2015-10-01 DIAGNOSIS — N189 Chronic kidney disease, unspecified: Secondary | ICD-10-CM | POA: Diagnosis not present

## 2015-10-01 DIAGNOSIS — R001 Bradycardia, unspecified: Secondary | ICD-10-CM | POA: Diagnosis not present

## 2015-10-01 DIAGNOSIS — I509 Heart failure, unspecified: Secondary | ICD-10-CM | POA: Diagnosis not present

## 2015-10-01 DIAGNOSIS — I2510 Atherosclerotic heart disease of native coronary artery without angina pectoris: Secondary | ICD-10-CM | POA: Diagnosis not present

## 2015-10-01 DIAGNOSIS — I10 Essential (primary) hypertension: Secondary | ICD-10-CM | POA: Diagnosis not present

## 2015-10-02 DIAGNOSIS — I519 Heart disease, unspecified: Secondary | ICD-10-CM | POA: Diagnosis not present

## 2015-10-02 DIAGNOSIS — N189 Chronic kidney disease, unspecified: Secondary | ICD-10-CM | POA: Diagnosis not present

## 2015-10-02 DIAGNOSIS — I509 Heart failure, unspecified: Secondary | ICD-10-CM | POA: Diagnosis not present

## 2015-10-02 DIAGNOSIS — R001 Bradycardia, unspecified: Secondary | ICD-10-CM | POA: Diagnosis not present

## 2015-10-02 DIAGNOSIS — I10 Essential (primary) hypertension: Secondary | ICD-10-CM | POA: Diagnosis not present

## 2015-10-02 DIAGNOSIS — I2510 Atherosclerotic heart disease of native coronary artery without angina pectoris: Secondary | ICD-10-CM | POA: Diagnosis not present

## 2015-10-04 DIAGNOSIS — I519 Heart disease, unspecified: Secondary | ICD-10-CM | POA: Diagnosis not present

## 2015-10-04 DIAGNOSIS — I10 Essential (primary) hypertension: Secondary | ICD-10-CM | POA: Diagnosis not present

## 2015-10-04 DIAGNOSIS — N189 Chronic kidney disease, unspecified: Secondary | ICD-10-CM | POA: Diagnosis not present

## 2015-10-04 DIAGNOSIS — R001 Bradycardia, unspecified: Secondary | ICD-10-CM | POA: Diagnosis not present

## 2015-10-04 DIAGNOSIS — I2510 Atherosclerotic heart disease of native coronary artery without angina pectoris: Secondary | ICD-10-CM | POA: Diagnosis not present

## 2015-10-04 DIAGNOSIS — I509 Heart failure, unspecified: Secondary | ICD-10-CM | POA: Diagnosis not present

## 2015-10-10 DIAGNOSIS — I10 Essential (primary) hypertension: Secondary | ICD-10-CM | POA: Diagnosis not present

## 2015-10-10 DIAGNOSIS — I519 Heart disease, unspecified: Secondary | ICD-10-CM | POA: Diagnosis not present

## 2015-10-10 DIAGNOSIS — N189 Chronic kidney disease, unspecified: Secondary | ICD-10-CM | POA: Diagnosis not present

## 2015-10-10 DIAGNOSIS — I2510 Atherosclerotic heart disease of native coronary artery without angina pectoris: Secondary | ICD-10-CM | POA: Diagnosis not present

## 2015-10-10 DIAGNOSIS — I509 Heart failure, unspecified: Secondary | ICD-10-CM | POA: Diagnosis not present

## 2015-10-10 DIAGNOSIS — R001 Bradycardia, unspecified: Secondary | ICD-10-CM | POA: Diagnosis not present

## 2015-10-13 ENCOUNTER — Ambulatory Visit: Payer: Medicare PPO | Admitting: Pulmonary Disease

## 2015-10-13 DIAGNOSIS — N189 Chronic kidney disease, unspecified: Secondary | ICD-10-CM | POA: Diagnosis not present

## 2015-10-13 DIAGNOSIS — I519 Heart disease, unspecified: Secondary | ICD-10-CM | POA: Diagnosis not present

## 2015-10-13 DIAGNOSIS — R001 Bradycardia, unspecified: Secondary | ICD-10-CM | POA: Diagnosis not present

## 2015-10-13 DIAGNOSIS — I2510 Atherosclerotic heart disease of native coronary artery without angina pectoris: Secondary | ICD-10-CM | POA: Diagnosis not present

## 2015-10-13 DIAGNOSIS — I509 Heart failure, unspecified: Secondary | ICD-10-CM | POA: Diagnosis not present

## 2015-10-13 DIAGNOSIS — I10 Essential (primary) hypertension: Secondary | ICD-10-CM | POA: Diagnosis not present

## 2015-10-17 DIAGNOSIS — N189 Chronic kidney disease, unspecified: Secondary | ICD-10-CM | POA: Diagnosis not present

## 2015-10-17 DIAGNOSIS — I2510 Atherosclerotic heart disease of native coronary artery without angina pectoris: Secondary | ICD-10-CM | POA: Diagnosis not present

## 2015-10-17 DIAGNOSIS — R001 Bradycardia, unspecified: Secondary | ICD-10-CM | POA: Diagnosis not present

## 2015-10-17 DIAGNOSIS — I10 Essential (primary) hypertension: Secondary | ICD-10-CM | POA: Diagnosis not present

## 2015-10-17 DIAGNOSIS — G309 Alzheimer's disease, unspecified: Secondary | ICD-10-CM | POA: Diagnosis not present

## 2015-10-17 DIAGNOSIS — I519 Heart disease, unspecified: Secondary | ICD-10-CM | POA: Diagnosis not present

## 2015-10-17 DIAGNOSIS — I509 Heart failure, unspecified: Secondary | ICD-10-CM | POA: Diagnosis not present

## 2015-10-20 DIAGNOSIS — I509 Heart failure, unspecified: Secondary | ICD-10-CM | POA: Diagnosis not present

## 2015-10-20 DIAGNOSIS — N189 Chronic kidney disease, unspecified: Secondary | ICD-10-CM | POA: Diagnosis not present

## 2015-10-20 DIAGNOSIS — R001 Bradycardia, unspecified: Secondary | ICD-10-CM | POA: Diagnosis not present

## 2015-10-20 DIAGNOSIS — I10 Essential (primary) hypertension: Secondary | ICD-10-CM | POA: Diagnosis not present

## 2015-10-20 DIAGNOSIS — I2510 Atherosclerotic heart disease of native coronary artery without angina pectoris: Secondary | ICD-10-CM | POA: Diagnosis not present

## 2015-10-20 DIAGNOSIS — I519 Heart disease, unspecified: Secondary | ICD-10-CM | POA: Diagnosis not present

## 2015-10-23 ENCOUNTER — Other Ambulatory Visit: Payer: Self-pay | Admitting: Cardiovascular Disease

## 2015-10-24 ENCOUNTER — Other Ambulatory Visit: Payer: Medicare PPO

## 2015-10-24 ENCOUNTER — Ambulatory Visit (INDEPENDENT_AMBULATORY_CARE_PROVIDER_SITE_OTHER): Payer: Medicare PPO | Admitting: Pulmonary Disease

## 2015-10-24 ENCOUNTER — Encounter: Payer: Self-pay | Admitting: Pulmonary Disease

## 2015-10-24 VITALS — BP 94/50 | HR 50 | Temp 98.2°F

## 2015-10-24 DIAGNOSIS — R609 Edema, unspecified: Secondary | ICD-10-CM | POA: Insufficient documentation

## 2015-10-24 DIAGNOSIS — I2510 Atherosclerotic heart disease of native coronary artery without angina pectoris: Secondary | ICD-10-CM

## 2015-10-24 DIAGNOSIS — I509 Heart failure, unspecified: Secondary | ICD-10-CM | POA: Diagnosis not present

## 2015-10-24 DIAGNOSIS — I10 Essential (primary) hypertension: Secondary | ICD-10-CM | POA: Diagnosis not present

## 2015-10-24 DIAGNOSIS — R001 Bradycardia, unspecified: Secondary | ICD-10-CM | POA: Diagnosis not present

## 2015-10-24 DIAGNOSIS — R627 Adult failure to thrive: Secondary | ICD-10-CM

## 2015-10-24 DIAGNOSIS — I5042 Chronic combined systolic (congestive) and diastolic (congestive) heart failure: Secondary | ICD-10-CM

## 2015-10-24 DIAGNOSIS — I872 Venous insufficiency (chronic) (peripheral): Secondary | ICD-10-CM | POA: Diagnosis not present

## 2015-10-24 DIAGNOSIS — R32 Unspecified urinary incontinence: Secondary | ICD-10-CM

## 2015-10-24 DIAGNOSIS — F0390 Unspecified dementia without behavioral disturbance: Secondary | ICD-10-CM

## 2015-10-24 DIAGNOSIS — N189 Chronic kidney disease, unspecified: Secondary | ICD-10-CM | POA: Diagnosis not present

## 2015-10-24 DIAGNOSIS — I4510 Unspecified right bundle-branch block: Secondary | ICD-10-CM

## 2015-10-24 DIAGNOSIS — I519 Heart disease, unspecified: Secondary | ICD-10-CM | POA: Diagnosis not present

## 2015-10-24 NOTE — Progress Notes (Signed)
Subjective:    Patient ID: Jessica [name], female    DOB: [birth year], 81 y.o.   MRN: [id number]  HPI 81 y/o BF here for a follow up visit...  She has mult medical problems including:  AR & runny nose;  Asthmatic Bronchitis;  HBP;  Chronic VI & edema;  Hyperchol;  GERD & esoph stricture;  Divertics;  Hx Urinary incontinence & UTIs;  DJD/ Osteoporosis;  Semile Dementia;  Anxiety...   ~  SEE PREV EPIC NOTES FOR EARLIER DATA >>   LABS 4/14:  Chems- Ca=11.2 but PTH is sl lower than prev at 86;  Phos=3.4 on KPhos1tabBid; K= 3.7 on this & K10- 2AM+1PM;  LFTs= wnl and Alb= 4.0 on Ensure...  Labs 7/14:  Chems- wnl x Ca=10.8, continue same meds...  LABS 11/14:  Chems- ok x K=3.2, BUN=24, Cr=1.1;  CBC w/ Hg=11.8;  TSH=2.48...  LABS 1/15:  Chems- ok w/ K=3.5, Cr=1.2  LABS 6/15:  Chems- ok x Ca=11.2, PO4=2.5, PTH=108 c/w primary hyperpara;  CBC- Hg=12.3;  TSH=2.09...   LABS 8/15:  Chems- ok x K=3.1 and Cr=1.2;  Phos=2.9;  UA= clear...   ~  September 20, 2014:  67mo ROV & post hosp visit> Dim was Adm by Cards 9/14 - 09/02/14 w/ SOB, she denied CP, EKG was unchanged but Troponins were elev, D-dimer was also abn & a CTAngioChest was done- neg for PE, and VenDopplers were neg for DVT; she was treated w/ Heparin transiently;  2DEcho showed decr LVF w/ EF=45-50% w/ areas of HK, & Gr1DD w/ mild AS/AI- felt to have prob anteroseptal MI w/ distal LAD lesion likely but she refused invasive eval & was made a NCB/ DNR/ med rx only;  Meds were adjusted w/ Hep=> Plavix added, BBlocker continued, and ACE added; disch to [facility] for rehab & post hosp f/u by [facility] team arranged...  In the rehab facility she was managed by the [facility] their notes are reviewed...  She was disch home 10/2 w/ home healt PT/ OT/ CNA/ Nursing/ and Social worker all arranged...  After disch she returned to the ER 10/3 w/ paresthesias and weakness> seen by ER staff & Neuro consultant; BP was recorded at 180-190 sys, CT Brain & MRI  were neg for acute infarct & she was felt to have had a TIA- already on Plavix & w/ nothing further to offer she was sent home...  She comes in today w/ daugh & appears weaker, BP is low at 92/64, marked increase in leg edema since off her diuretics (stopped during recent hosp w/ her NSTEMI), going downhill but ever stoic she says she is doing satis at home w/ BSC, walker, etc; they truly love the home health care assist w/ Pt/ OT/ HHN/ aides/ & social worker; they again decline to consider NHP for pt & her husband...  We reviewed the following medical problems during today's office visit >>     HBP> prev on Demadex20-2Qam, Diamox250-at 4pm, & K10-2Bid + KPhos-1Bid; but now off all diuretics on MetopER25-1/2 daily, Lisinopril5 & KPhos-1Bid; BP=92/64 & no wiggle room to add back diuretics=> she will f/u w/ Cards soon...     ASHD, s/p NSTEMI, ischemic cardiomyopathy, combined sys & diast CHF> Adm 9/15 by Cards w/ NSTEMI & combined sys/diast CHF; she declined intervention; medical therapy adjusted- off diuretics, added MetopER12.5/ Lisin5/ ASA81/ Plavix75, but edema incr.    VI, edema> off diuretics now per Cards adjustment 9/15; she knows to elim sodium, elev legs, wear  support hose; she has f/u appt w/ Cards soon...    Chol> on diet alone; she has a very hi HDL ~150 when measured in 2012...    Hypercalcemia> assoc w/ low phos- c/w primary hyperparathyroidism- she is 81y/o & not felt to be an operative candidate; we are following Ca, PO4, PTH levels...     GI- GERD, Divertics> off prev Prilosec20, Miralax, Senakot-S; advised these all as needed...    GU> saw DrTannenbaum for Urology 10/12- mult complaints, urodynamics done w/ sm hypersens bladder unstable w/ leakage, & large bladder divertic> he rec double voiding technique & Ditropan5-1/2Bid...    ?TIA, Neuropathy> on Neurontin100Tid; she went to the ER 10/15 one day after disch from the NH w/ numbness=> NEG CTBrain & MRI w/o acute changes, symptoms passed &  sent home on same ASA81 + Plavix75/d...    DJD, Osteopenia> off Ca supplements, on KPhos-1Bid, on MVI & encouraged to walk as much as poss...    Senile Dementia> on Aricept5 & Alpraz0.25 as needed... We reviewed prob list, meds, xrays and labs> see below for updates >>   LABS 10/15:  FLP- ok on diet alone w/ HDL=159!  Chems- ok w/ Cr=1.01, K=4.1, BS=104, A1c=6.0, Ca=10.8;  CBC- Hg=11.7;  TSH=2.48...  CXR 9/15 showed mild cardiomeg, clear lungs, poor inspiration, osteopenia, scoliosis w/o change, NAD...  EKG 9/15 showed SBrady, rate47, PACs, RBBB, left axis deviation, NSSTTWA...   2DEcho 9/15 showed mild LVH, reduced LVF w/ EF=45-50% w/ HK in apex & septum, Gr1DD, mild AS/AI, mildly thickened MV leaflets & mild LAdil, PAsys=40...  CT Angio Chest 9/15 showed neg for PE, mildly enlarged cardiac chambers, Ao looked ok, COPD/E bilat w/ atx vs scarring at bases, scoliosis & DDD, ?nonobstructing stone in mid right kidney...  CT Head 10/15 showed mild diffuse atrophy, no acute changes...  MRI Head 10/15 revealed no infarct, hemorrhage, or mass; mild atrophy & sm vessel dis, NAD...  PLAN>> she has gained 19# since last here w/ marked edema (4+bilat in legs) since she's been off her diuretics; however BP is low at 92/64 on her low dose Metop & Lisin per Cards; advised no salt/ elev legs/ support hose/ & f/u w/ Cards ASAP as planned (not many options here- perhaps stop BBlocker, add diuretic, consider pacer);  She is happy w/ all the home health support...  ~  October 06, 2014:  2wk ROV & post-hosp visit> after last OV she was hosp by [facility] from 10/9 - 09/29/14 w/ incr SOB (O2sat=84% on RA), edema (BNP=9280, CXR w/ pulm edema), and weakness; but no CP or other symptoms and troponins were neg;  She was diuresed and her meds were adjusted (Metoprolol stopped due to low heart rate); disch to NHP for rehab and she is loving the attention "they are working with me"- on Lasix40 & KPhos bid, plus her ASA/  Plavix/ Lisin5...  Her CC is constipation> going every 3-4 days & firm, no blood reported; she is rec to start a regimen w/ Miralax daily & Senakot-S 2Qhs... We discussed rechecking her CXR (improved aeration) and LABS (K=3.1, HCO3=42, Cr=1.1, BNP=485) today... REC to continue Lasix40, Add back the Diamox250/d & the KCl- 10mEq caps 2/d as before... ROV in 2-3 weeks...     We reviewed prob list, meds, xrays and labs> see below for updates >>   CXR 10/15 Hosp showed cardiomeg, engorged pulm vasculature, increased markings, can't r/o effusions, all c/w pulm edema...   CXR 10/15 office f/u showed cardiomeg, improved aeration, persist   bilat effusions...  LABS 10/15 office f/u>  Chems- K=3.1, HCO3=42, BUN=16, Cr=1.1, Ca=10.4, BNP=485...   ~  October 25, 2014:  3wk ROV & [place] is still in [facility] & to be released home in 3d her daughter says; advised to talk to NH staff about max home health support w/ visiting nurses, PT/ OT, etc; Phys therapy has checked her & the home situation & they feel she is safe for DC home; Daughter needs schooling on pt's home care needs, feeding, use of "thick-it", and wants to know "how to keep the pill box filled"...  Jessica [name] had [facility] f/u 10/22 w/ PA-Luke & they felt she was doing OK, no changes made, f/u 3mo;  I note that the Diamox & KCl we tried to add after her last OV here is not on her list- ?what happened? Since she was here last she has lost another 5# but still has 2+edema in LEs, breathing is OK, she is avoiding salt she says;    Hx HBP, ASHD, s/pNSTEMI, ischemic cardiomyop (EF=45-50% w/ HK), combined sys&diast CHF, etc> on ASA81, Plavix75, Lisin5, Lasix40, KPhos2/d; Prev ordered Diamox250 & K10 are not on her list; BP= 122/64 & she denies ch in SOB, edema (2+), etc;  Labs w/ K=2.7, HCO3=40, Cr=1.1, BNP=311;  CXR w/ sm bilat effusions persist;  Plan is to add the Diamox250 one daily at 4PM, & KCl20mEqBid, continue other meds the same...     On Miralax,  Senakot, Ensure> not taking PPI now; Hx GERD, Divertics; needs thick-it for her fluids per speech path & daughter must be taught...    On Ditropan5-1/2Bid for bladder...    On Neurontin100Tid for neuropathy...    On Aricept5 for memory & Alprazolam 0.25 prn nerves...  We reviewed prob list, meds, xrays and labs> see below for updates >> we reconciled med lists   CXR 11/15 showed small bilat effusions and rounded area ?fluid in fissure) on left lat chest...  LABS 11/15:  Chems- K=2.7, HCO3=40, Cr=1.1;  BNP=311... PLAN>>  Continue meds, we started KCl 20mEq Bid and added DIAMOX250mg one tab at 4PM daily... ROV in 3 wks.  ~  November 28, 2014:  1mo ROV & recheck> despite all efforts- conversation w/ daughter who brings pt to office/ son who lives w/ pt/ visiting nurses from [facility]- Jessica [name] is still not taking the prescribed meds properly, Pharm ([facility] [address]) confirms that she hasn't filled Lasix, Diamox, or the KCl!...   I discussed this w/ the daughter but she doesn't understand & called her brother, I talked to him on the phone and he doesn't know the meds or have interest in providing this service for his mother;  Finally I called [facility] [phone] & talked to [name] & asked them to increase services to MrsSolomon to whatever level is needed to provide the needed support 7 total supervision of her meds etc...    CV meds> ASA81, Plavix75, Lisinopril5, Lasix40Qam, Diamox250Qpm, KCl20Bid, KPhos-1Bid    Neuro meds> Aricept5Bid, Neurontin100Tid, Xanax0.25Tid prn    Others> Ditropan5-1/2Bid, Miralax daily, MVI, Ensure  ~  December 29, 2014:  1mo ROV & recheck> Jessica [name] is here w/ her son (first time I have met him) who does her meds; [facility] has been coming out but apparently NOT supervising her meds (no notes or calls from them)- still done by son,placing pills in a weekly med box & checking to be sure she is taking it properly; they brought all her med bottles today- [facility] & prev filled 12/14 (date of last OV); unfortunately she has not lost any edema (still 3-4+ in both feet/ lower legs and her wt is the same as last month ~114#.  She feels the same- denies CP, palpit, SOB, dizzy, syncope, etc; remains weak, in wheelchair at 81y/o, & VS are stable w/ BP=100/52, pulse= 60/min, O2sat=94% on RA; Chest has diminished BS at bases bilat but no wheezing, rales, rhonchi; Cardiac exam unchanged w/ Gr1/6 SEM no rubs or gallops apprec...     We reviewed prob list, meds, xrays and labs> see below for updates >>   LABS 1/16:  Chems- ok w/ K=3.9, BS=100, Cr=1.14, Po4=2.9...  PLAN>> we are checking her metabolic labs to see if we can safely increase her Lasix dose to affect a better diuresis; in the meanwhile she is reminded to elim salt/ sodium for her diet, elev legs, wear support hose; given 2gm Na diet sheet; we plan ROV recheck in 1mo... => Chems OK therefore increase Lasix40mg- 2tabsQam.  ~  February 07, 2015:  1mo ROV & she is here w/ her son> they assure me that she is taking her meds properly as outlined in her med list... She states that she is doing satis, stable, and she denies SOB; still wheelchair bound, difficulty getting up & difficulty w/ personal care needs; she feels that the swelling in feet/legs is about the same & weight is 113# down 1# today...    She saw DrBerry for Cards 1/16> HBP, ASHD, s/pNSTEMI, ischemic cardiomyop w/ combined sys&diast CHF, etc> 2DEcho 9/15 showing mild LVH, reduced LVF w/ EF=45-50% w/ HK in apex & septum, Gr1DD, mild AS/AI, mildly thickened MV leaflets & mild LAdil, PAsys=40;  On ASA81, Plavix75, Lisin5, Lasix40-2Qam, Diamox250Qpm, KCl20Bid & KPhosBid;  Edema looks about the same at 3+ in feet & lower legs, exam otherw unchanged and they did not add or change any meds, f/u planned 6mo...    We reviewed prob list, meds, xrays and labs> see below for updates >>  PLAN>>  We decided to continue same meds for now; reminded- NO SALT/ SODIUM  in diet;  Wear support hose & keep legs up;  ROV in 6 weeks w/ CXR & lab work planned...  ~  March 21, 2015:  6wk ROV & Jessica [name] is here for a follow up visit w/ her son; she looks good & reports that she has been doing satis at home; they have help for her & husb several hours/d and this appears to be working for them; no new complaints or concerns; she has lost 2# down to 111# today & remains on Lasix40-2Qam, Diamox250 at4pm, KCl20Bid & KPhosBid- she has 2+edema in LEs w/o change 7 knows to avoid sodium etc...     BP=128/78 on above + Lisin5;  She remains on ASA81+Plavix75 for her ASHD, HxMI, ischemic cardiomyop & combined sys & diast CHF...    On Miralax, Ensure> not taking PPI now; Hx GERD, Divertics; needs thick-it for her fluids per speech path but refuses the fuss...    On Ditropan5-1/2Bid for bladder...    On Neurontin100Tid for neuropathy...    On Aricept10 for memory & Alprazolam 0.25 prn nerves...  We reviewed prob list, meds, xrays and labs> see below for updates >>   LABS 4/16:  Chems- ok w/ Cr=1.3 (incr from 1.1) and Ca=10.8 (incr from 10.4) w/ PO4=3.2;  CBC- anemic w/ Hg=10.3 (down from 12.8) & MCV=81...  Rec to start [id number] Qam & PRILOSEC20 before dinner... (NOTE>   I spent 25 min face-to-face time w/ pt & son reviewing her medical problems, medications, & treatment plan.)  ~  May 29, 2015:  2mo ROV & Jessica [name]'s situation remains the same- doing satis at home w/ son looking in & helping out; she has lost another 1# to 110# today; she has mult somatic complaints> not getting about well, post nasal drip, decr ROM right shoulder; she notes that her breathing is good...     HBP> on Lisinopril5, Lasix40-2Qam, Diamox250@4pm, K20Bid & KPhos1Bid; BP=100/58 & she has persistent edema, mult somatic complaints; she will f/u w/ Cards soon...     ASHD, s/p NSTEMI, ischemic cardiomyopathy, combined sys & diast CHF> Adm 9/15 by [facility] w/ NSTEMI & combined sys/diast CHF; she declined intervention; followed  by DrBerry/Croitoru etal on above + ASA81/Plavix75; last seen 1/16 w/ f/u planned 6mo...     VI, edema> on Lasix80 & Diamox250; she knows to elim sodium, elev legs, wear support hose; she has f/u appt w/ Cards soon...    Chol> on diet alone; she has a very hi HDL ~150 when measured in 2012...    Hypercalcemia> assoc w/ low phos- c/w primary hyperparathyroidism- she is 81y/o & not felt to be an operative candidate; we are following Ca, PO4, PTH levels (non-progressive)...     GI- GERD, Divertics> off prev Prilosec20, Miralax, Senakot-S; advised these all as needed...    GU> saw DrTannenbaum for Urology 10/12- mult complaints, urodynamics done w/ sm hypersens bladder unstable w/ leakage, & large bladder divertic> he rec double voiding technique & Ditropan5-1/2Bid...    ?TIA, Neuropathy> on Neurontin100Tid; she went to the ER 10/15 one day after disch from the NH w/ numbness=> NEG CTBrain & MRI w/o acute changes, symptoms passed & sent home on same ASA81 + Plavix75/d...    DJD, Osteopenia> off Ca supplements, on KPhos-1Bid, on MVI & encouraged to walk as much as poss...    Senile Dementia> on Aricept10 & Alpraz0.25 as needed...    Anemia> on slow release iron tab daily... We reviewed prob list, meds, xrays and labs>>  IMP/PLAN>>  Jessica [name] is stable but very frail; her son does a good job supervising all meds and helping w/ their home care; needs to elim all sodium! She requests to continue follow up visits every 2mo...  ~  August 09, 2015:  2mo ROV & Jessica [name] was seen 7/27 by DrWert w/ UTI- c/o difficulty voiding, no f/c/s/ etc; UA w/ TNTC wbc & bact; Cult= strep viridans; Abd Sonar w/ min fullness of right collecting system, bladder divertic containing debris, renal cort thinning, 2 left renal cysts; she was treated w/ Cipro & he stopped her Oxybutynin (see prev Urology eval by DrTannenbaum below)- UTI symptoms resolved but she feels stopping the anticholinergic hasn't helped her voiding & in light of her prev  eval we will restart the Oxybutynin5mg- 1/2Bid for her bladder instability; I offered referral to Urology but she prefers med trial 1st, consider further Urology eval if needed later...       Jessica [name] also had [facility] follow up 7/6> Hx HBP, CAD- s/p NSTEMI w/ chronic sys&diast CHF, Bradycardia; felt to be stable, no change in meds, they plan f/u 6mo...       She remains stable on ASA81/ Plavix75, Lisn5, Lasix40-2Qam, Diamox250-1Qpm, KCl20Bid, KPhos-1Bid;  BP= 110/60, pulse=52 reg, exam w/ gr1/6 SEM no r/g, ext w/ 2+ edema...  Labs 7/16 showed Chems- ok w/ K=3.9, BUN=24, Cr=1.26;  CBC- Hg=11.7, WBC=5.4;  UA- TNTC wbc and bact =>   treated w/ Cipro  Abd Sonar 07/12/15 w/ min fullness of right collecting system, bladder divertic containing debris, renal cort thinning, 2 left renal cysts.   IMP/PLAN>>  Clinically stable w/ mild urinary symptoms- no better off Oxybutynin, prev UTI rx'd w/ Cipro; offered referral to Urology but she prefers trial back on Oxybutynin$RemoveBeforeDEI'5mg'fDmUXJbeNjqHnFGR$ - 1/2Bid; continue other meds the same, encouraged phys activity & exercise as much as poss...   ~  November 8,2016:  2-61mo ROV & Jessica [name] went to the ER 09/27/15 c/o weakness & dysuria; eval revealed a neg UA and stable labs x Troponin=0.7; she was seen by DrBerry for [facility]- HxHBP, chr sys&diast CHF, asymptomatic bradycard, EF=45-50% w/ wall motion abn in 2015; she is a DNR & no further cardiac w/u recommended;  She was seen by [facility] & after talking w/ family (daugh) they rec home w/ hospice ([facility])...  Pt doesn't remember going to the ER- when asked about hospice she says "they help me shower"; she is able to stand w/ walker, ambulate w/ walker to bathroom & take care of toileting etc; they jave help w/ meals, etc... When asked if there was anything else she says "when air blows across my arm at night it causes aching"...      EXAM shows Afeb, BP=94/50, O2sat=100% on RA;  HEENT- neg mallampati1;  Chest- decr BS bilat w/o w/r/r;  Heart- RR gr1/6SEM w/o r/g;   Abd- soft nontender;  Ext- VI, 1-2+edema in feet, w/o c/c;  Neuro- slow mentation, nonfocal...  CXR 09/27/15 showed norm heart size, essentially clear lungs, few chr changes, mild scoliosis, kyphosis, DJD, & osteopenia of spine, NAD.Marland KitchenMarland Kitchen   EKG 09/27/15 showed sinus brady, rate52, RBBB, LAD, NSSTTWA...  LABS 09/27/15:  Chems- wnl w/ Cr=1.08, K=3.7;  CBC- Hg=11.8.Marland KitchenMarland Kitchen IMP/PLAN>>  BP is low on her Lisinopril5 & diuretic therapy; still w/ 2+ edema in feet; Rec to stop the Lisinopril & continue Lasix80Qam, Diamox250Qpm, K20Bid & KPhos1Bid;  She now has Hospice help at home- comfort measures & supportive care...         Problem List:  Hx of ALLERGIES (ICD-995.3) - she uses OTC meds as needed, & we prev discussed Zyrtek $RemoveBeforeDE'10mg'mMFjPDHTYkkHyKV$ Qam, Saline nasal mist, & Atrovent Nasal 0.03% Tid for drainage (one of her chronic complaints)... ~  8/15: we tries Astelin nasal spray for her chr complaints of dripping...  Hx of ASTHMATIC BRONCHITIS, ACUTE (ICD-466.0) - no recent exacerbations... ~  CXR 5/13 showed borderline heart size, kyphoscoliosis, no edema, and clear lungs... ~  CXR 10/13 showed normal heart size, clear lungs, kyphoscoliosis, osteopenia... ~  CXR 10/15> Hosp w/ CHF (cardiomeg, bilat effusions and interstitial edema); she was diuresed and disch to NHP/rehab=> f/u CXR 10/15 w/ improved aeration, persist bilat effusions... ~  CXR 11/15 showed small bilat effusions and rounded area ?fluid in fissure) on left lat chest. ~  CXR 10/16 showed norm heart size, essentially clear lungs, few chr changes, mild scoliosis, kyphosis, DJD, & osteopenia of spine, NAD  HYPERTENSION (ICD-401.9) >>  ASHD, s/p NSTEMI, ischemic cardiomyopathy, combined sys & diast CHF>>  ~  controlled on ASA $Remo'81mg'TMHIO$ /d, NORVA'5mg'$ -1/2tab, LISINOPRIL5 & DEMADEX $RemoveB'20mg'pHEocAiX$ /d & DIAMOX$RemoveBe'250mg'Ezlpfybdz$ /d... ~  4/12:  BP= 110/60 and she states that she is taking her meds regularly & tolerating them well... denies HA, visual changes, CP, palpit, change in dyspnea, etc...  but notes weakness, intermittently dizzy & persist edema in her feet...  ~  6/12:  BP= 124/70 & she remains stable... ~  8/12:  BP= 110/ 66 & stable... ~  10/12:  BP= 114/62 & stable... ~  1/13:  BP= 110/64 & she remains stable... ~  4/13:  BP= 116/58 & she is reminded to adjust Lasix 1-2 Qam according to her edema... ~  5/13:  BP= 140/72 & she is c/o pedal edema; we decided to change to Demadex $RemoveBe'20mg'aBAxKjqTQ$ - 2Qam. ~  7/13:  BP= 110/60 & her edema is diminished, still 1+ in feet... Labs showed HCO3=39 & DIAMOX $Remove'250mg'XCxiHsF$ /d added. ~  8/13:  BP= 118/74 & edema is the same; ?what she's taking? & we decided to simplify regimen> Norvasc1/2, Lisin1/2, Demadex-1/d & Diamox-1/d... ~  9/13:  BP= 110/70 & she is clinically stable on these meds; add K10Bid for the K=3.4 today... ~  EKG 10/13 showed NSR, rate63, RBBB, poss old infarct anterolaterally... ~  11/13:  on ASA81, Amlod2.5, Lisin5, Demadex20-1/2, Diamox250, & K20/d; BP=122/64 & she is feeling better, still w/ mult somatic complaints... ~  12/13:  BP= 124/72 & her edema, weight, etc are about stable... ~  1/14:  BP= 118/60 & she persists w/ mult somatic complaints... ~  4/14:  on ASA81, Amlod2.5, Lisin5, Demadex20-1/2, Diamox250, & K10-3/d + KPhos-1Bid; BP=100/62 & she is feeling sl weak w/ mult somatic complaints; labs 4/14 show K=3.7, Phos=3.4, Cr=1.1; we decided to STOP the Amlodipine & work on sodium restriction ~  6/14: on ASA81, Lisiin5, Demadex20, Diamox250, & K10-3/d + KPhos-1Bid; BP= 110/68 & 3+edema persists; Labs- OK & we decided to STOP the Lisinopril & INCR the Demadex20-2Qam.... ~  7/14: on  ASA81, Demadex20-2/d, Diamox250, & K10-3/d + KPhos-1Bid; BP= 120/62 & she is improved w/ decr edema... ~  9/14:  BP remains well controlled on diuretics alone & measures 116/64 today.. ~  11/14: BP is controlled on ASA81, Demadex20-2Qam, Diamox250, & K10-3/d + KPhos-1Bid (off prev Amlod & Lisin); BP=122/70 & she denies angina pain, palpit, ch in SOB etc; K is sl  low at 3.2 7 rec to incr KCl10 to 2Bid. ~  3/15: BP= 106/64 on Demadex20Qam & Diamox250Qpm w/ K10-2Bid and KPhos1Bid... ~  6/15:  on ASA81, Demadex20-2Qam, Diamox250-at 4pm, & K10-2Bid + KPhos-1Bid; BP=100/60 & labs 6/15 show K=4.0, Phos=2.5, Cr=1.1; continue same meds for now. ~  8/15: on ASA81, Demadex20-2Qam, Diamox250 at 4pm & K10-2/d;  BP= 120/62, continue same... ~  9/15: she was [facility] by Cards (Adm 9/14 - 09/02/14))w/ MI, cardiomyopathy w/ combined sys & diast CHF, & she refused invasive procedures/ cath etc; disch to [facility] rehab, then back home on ASA81, Plavix75, MetopER12.5, Lisin5, KPhos-Bid, & off all diuretics...  ~  2DEcho 9/15 showed mild LVH, reduced LVF w/ EF=45-50% w/ HK in apex & septum, Gr1DD, mild AS/AI, mildly thickened MV leaflets & mild LAdil, PAsys=40... ~  10/15: ret to office weaker w/ 19# wt gain & 4+edema off her diuretics w/ BP= 92/64, wt up 19# w/ 4+edema=> she was re-hosp w/ pulm edema (Adm 10/9 - 09/29/14), BNP=9280, given IV Lasix & med adjustment- disch on ASA81, Plavix75, Lasix40, Lisin5, KPos-Bid=> now w/ decr edema & BP=124/62 but f/u labs showed persist bilat pleural effusions, K=3.1, HCO3=42, Cr=1.1, BNP=485... REC to continue current meds, add Diamox250 at 4PM daily, add KCl-31mEq caps 2/d but this wasn't done by the NH... ~  11/15: She has lost another 5# & breathing is stable> CXR w/ persistent sm bilat effusions & ?fluid in fissure; Labs showed K=2.7, HCO3=40, Cr=1.1, & BNP=311; we decided to add the Diamox250 & K20Bid ~  12/15: it is apparent that she is not taking any  of her meds since disch from the NH; daugh is clueless & son (who does her meds), doesn't know what they are or what they are for; [facility] confirms not refilling Lasix, Diamox, others. We discussed having visiting nurse supervision of meds & I talked to [place]... ~  1/16: she is here w/ her son who is still doing her meds he says> they brought med bottles and [facility] taking Lisin5, Lasix40,  Diamox250, KCl20Bid, KPhosBid; unfortunately her wt is the same at 114# & her edema remains 3-4+ in LEs; we will recheck labs & see if we can incr the Lasix dose=> Labs ok therefore incr Lasix40-2Qam. ~  2/16: on ASA81, Plavix75, Lisin5, Lasix40-2Qam, Diamox250Qpm, KCl20Bid & KPhosBid; edema about the same ~3+ & weight= 113# today; Rec to take meds regularly every day, no salt, support hose, elev legs etc... ~  10/16: on ASA81, Plavix75, Lisin5, Lasix40-2Qam, Diamox250Qpm, KCl20Bid & KPhosBid; she presented to Er w/ weakness & dysuria but UA was clear; Troponin was 0.7 & DrBerry was consulted- he did not rec any further eval or change in therapy; she was disch from ER w/ Hospice home care/ DNR/ home care...  VENOUS INSUFFICIENCY, CHRONIC & EDEMA - on low sodium diet, elevation, support hose- prev refused diuretic meds due to urinary symptoms, then prev on [facility] which was stopped by Cards in hosp 9/15... subseq gained 20# w/ 4+ pedal edema & re-hosp 10/15 w/ IV lasix & disch on oral Lasix but it is apparent that she did not fill the Rx & wasn't taking her meds... ~  1/16:  She is back on Lisin5, Lasix40, Diamox250, KCl20Bid, KPhosBid=> but she has not lost any wt or any of her pedal edema=> K & PO4 are wnl, Cr=1.14 therefore incr Lsaix40 to 2Qam. ~  11/16:  Stable on Lisin5, Lasix40, Diamox250, KCl20Bid, KPhosBid; we stopped the Lisin5 due to hypotension  HYPERCHOLESTEROLEMIA (ICD-272.0) - prev on Lip10, but she stopped this on her own... she has a very high HDL!!! ~  FLP was 1/08 showed TChol 253, TG 125, HDL 121, LDL 93 ~  FLP 2/09 showed TChol 200, TG 99, HDL 136, LDL 44 ~  FLP 4/11 showed TChol 244, TG 143, HDL 149, LDL 59 ~  FLP 1/12 showed TChol 266, TG 102, HDL 151, LDL 83 >> BEST HDL I'VE EVER SEEN ~  It has been hard to get her back in for FASTING labs... ~  [place] 9/15 in [place] on diet alone showed TChol 222, TG 61, HDL 159, LDL 51  HYPERCALCEMIA >> routine labs w/ Calcium levels betw 10.2 &  11.7 over the last 65yrs... ~  Labs 7/13 showed Ca= 11.8 &  PTH= 81... For now avoid calc supplements etc... ~  Labs 9/13 showed Ca= 11.0 & she is reminded- no calcium, no VitD... ~  Labs 10/13 Hosp reviewed> Ca was up, Phos was low, K was low> all improved w/ Rx in hosp (they did not repeat PTH level). ~  4/14:   assoc w/ low phos- c/w primary hyperparathyroidism- she is 81y/o & not felt to be an operative candidate; she was started on KPhos 10/13 in [place] on 2Bid w/ improvement in serum Phos to 3.6; serum Ca = 11.2 now but PTH is not rising.  ~  Labs 11/14 showed Ca=10.3 ~  Labs 1/15 showed Ca= 10.5 ~  6/15:  Labs showed  Ca=11.2, PO4=2.5, PTH=108 c/w primary hyperparathyroidism; we are following... ~  Labs 8/15 improved w/ Ca= 10.4,  Phos= 2.9 ~  Labs 4/16 showe4d Ca= 10.8, PO4= 3.2  GERD (ICD-530.81) - last EGD was 9/05 by DrPerry showing GERD and esoph stricture- dilated... she stopped her Nexium but states that her swallowing is good & she uses PRILOSEC OTC as needed. ~  4/16:  Labs showed Hg down to 10.3 w/ MCV= 81; requested to restart PRILOSEC20 before dinner & take FeSO4 $RemoveBe'325mg'CSKEKogxs$  daily in AM...  DIVERTICULOSIS OF COLON (ICD-562.10) - last colonoscopy was 11/00 showing divertics only... ~  8/12:  She notes some constipation & rec to take [place] per our usual protocol...  HX, URINARY INFECTION (ICD-V13.02) & INCONTINENCE symptoms... she has seen DrTannenbaum & staff on bladder exercises and she reported some improvement but has persistant symptoms & never followed up... ~  7/10:  offered f/u appt w/ Urology vs second opinion consult but she declines... ~  9/10:  wrote for trial Enablex 7.$RemoveBeforeD'5mg'dWXSsIntrWuzbi$ /d >> no benefit she says. ~  12/10: saw DrNesi- tried sample med, sl improved, but "he turned me over to a lady for longer term treatments" she says... ~  4/12 & 6/12: states DrNesi hasn't been able to help her & she wants appt w/ DrTannenbaum==> seen w/ urodynamic eval revealing a sm  hypersens bladder & a large bladder divertic; not a surg candidate, taught double voiding technique... ~  10/12: she had f/u DrTannenbaum & he reinforced prev w/u & need for double voiding technique... ~  10/13: In hosp Urine grew mult species, Rx w/ Roceph=> Cipro... ~  She was tried on & off Ditropan, intermittent dysuria symptoms etc...  DEGENERATIVE JOINT DISEASE (ICD-715.90) - notes right shoulder symptoms, but managing OK w/ Mobic 7.$RemoveBe'5mg'TPdjqJYaM$  & Tylenol; also right hip & low back pain...  she also c/o neuropathic "burning" in legs but not bad enough for additional meds she says... ~  8/14: we have a note from [facility] (7 page note reviewed)> pt c/o bilat foot & ankle pain & burning w/ numbness & tingling; Dx w/ somatic dysfunction in lumbar region; subluxations seen on XRays; given an adjustment... ~  9/14: symptoms improved on Gabapentin $RemoveBefor'100mg'TqiXxGvwCAHC$  Tid she says...  OSTEOPOROSIS (ICD-733.00) - she was on Fosamax but had discontinued this on her own... we discussed the need for continued therapy and she was asked to restart Alendronate but she never did...  she takes Ca++, MVI, VitD==> rec to STOP the Calcium.  SENILE DEMENTIA (ICD-290.0) - she takes ASA $RemoveB'81mg'YHnHgMFG$ /d... she tried Aricept in the past but didn't notice any improvement therefore stopped it. ~  she & her husb still live on their own but are having numerous difficulties while still resisting any thoughts of assisted living etc> got concerned because she saw an impulse in her left antecubital fossa (tortuous brachial art), went to the ER & this got translated to palpitations & lead to full ER cardiac eval= neg... ~  6/11: MMSE showed 26/30 & she agreed to try DONEPEZIL $RemoveBefor'10mg'ccLeMcARzuTr$ /d... ~  6/12:  Still taking Aricept & appears stable... ~  7/13:  No change, same meds, clinically stable... ~  7/14:  She remains stable and will be 81 y/o in Oct... ~  3/15:  She remains on Aricept5Bid- she prefers it this way & won't change...  ANXIETY  (ICD-300.00) - she uses Alpraz 0.$RemoveBefor'25mg'mNaCXAvGvqFQ$  as needed.  Hx of SHINGLES (ICD-053.9)   Past Surgical History  Procedure Laterality Date  . Vesicovaginal fistula closure w/ tah    . Tonsillectomy    . Abdominal hysterectomy  Outpatient Encounter Prescriptions as of 10/24/2015  Medication Sig  . ALPRAZolam (XANAX) 0.25 MG tablet Take 0.125-0.25 mg by mouth 3 (three) times daily as needed. For nerves  . aspirin EC 81 MG tablet Take 81 mg by mouth daily.  . clopidogrel (PLAVIX) 75 MG tablet TAKE 1 TABLET BY MOUTH EVERY DAY  . donepezil (ARICEPT) 10 MG tablet Take 1 tablet (10 mg total) by mouth at bedtime.  Marland Kitchen Fe Bisgly-Vit C-Vit B12-FA (GENTLE IRON) 28-60-0.008-0.4 MG CAPS Take 1 capsule by mouth daily.  . furosemide (LASIX) 40 MG tablet TAKE 2 TABLETS BY MOUTH EVERY MORNING  . gabapentin (NEURONTIN) 100 MG capsule TAKE ONE CAPSULE BY MOUTH 3 TIMES A DAY  . KLOR-CON M20 20 MEQ tablet TAKE 1 TABLET (20 MEQ TOTAL) BY MOUTH 2 (TWO) TIMES DAILY.  . Multiple Vitamin (MULTIVITAMIN) tablet Take 1 tablet by mouth daily.    . nitroGLYCERIN (NITROSTAT) 0.4 MG SL tablet Place 1 tablet (0.4 mg total) under the tongue every 5 (five) minutes x 3 doses as needed for chest pain.  Marland Kitchen PHOSPHA 250 NEUTRAL 155-852-130 MG tablet TAKE 1 TABLET BY MOUTH 2 (TWO) TIMES DAILY.  Marland Kitchen polyethylene glycol (MIRALAX / GLYCOLAX) packet Take 17 g by mouth daily.  Marland Kitchen  lisinopril (PRINIVIL,ZESTRIL) 5 MG tablet TAKE 1 TABLET (5 MG TOTAL) BY MOUTH DAILY.  Marland Kitchen acetaZOLAMIDE (DIAMOX) 250 MG tablet TAKE 1 TABLET (250 MG TOTAL) BY MOUTH DAILY. AT 4 PM  . feeding supplement (ENSURE COMPLETE) LIQD Take 237 mLs by mouth 2 (two) times daily between meals. (Patient not taking: Reported on 09/27/2015)  . [DISCONTINUED] ciprofloxacin (CIPRO) 500 MG tablet One half twice daily x 5 days (Patient not taking: Reported on 09/27/2015)  . [DISCONTINUED] oxybutynin (DITROPAN) 5 MG tablet Take 1 tablet (5 mg total) by mouth 2 (two) times daily. (Patient not  taking: Reported on 09/27/2015)   No facility-administered encounter medications on file as of 10/24/2015.    No Known Allergies   Current Medications, Allergies, Past Medical History, Past Surgical History, Family History, and Social History were reviewed in [employer] record.    Review of Systems        See HPI - all other systems neg except as noted...      The patient complains of decreased hearing, dyspnea on exertion, peripheral edema, incontinence, muscle weakness, and difficulty walking.  The patient denies anorexia, fever, weight loss, weight gain, vision loss, hoarseness, chest pain, syncope, prolonged cough, headaches, hemoptysis, abdominal pain, melena, hematochezia, severe indigestion/heartburn, hematuria, suspicious skin lesions, transient blindness, depression, unusual weight change, abnormal bleeding, enlarged lymph nodes, and angioedema.     Objective:   Physical Exam      WD, thin/ weak/ frail, 81 y/o BF in NAD,sheis chr ill appearing... GENERAL:  Alert, pleasant & cooperative; she is slow moving as noted... HEENT:  [place]/AT, EOM-full, EACs- some wax on lft, TMs-wnl, NOSE-clear, THROAT-clear & wnl. NECK:  Supple w/ fairROM; no JVD; normal carotid impulses w/o bruits; no thyromegaly or nodules palpated; no lymphadenopathy. CHEST:  Clear to P & A; without wheezes/ rales/ or rhonchi., noted kyphosis... HEART:  Regular Rhythm; Gr1/6 SEM, without rubs or gallops heard... ABDOMEN:  Soft & nontender; normal bowel sounds; no organomegaly or masses detected. EXT:  mod arthritic changes; no varicose veins/ +venous insuffic/ 2+edema  NEURO:  weak, no focal neuro deficits... DERM:  No lesions noted; no rash etc...  MMSE> 06/13/10> Score 26/30 (missed place, 2/3 recall after distraction, & copy figure).Marland KitchenMarland Kitchen  Assessment & Plan:    ADULT FTT >> she presented to ER 10/16 w/ dysuria (clear UA) and weakness/ FTT; Hospice was called in for home care, comfort  support, DNR...  ASHD, s/p NSTEMI, combined sys/diast CHF, VI, edema>   [facility] she declined invasive options, treated medically- she will f/u w/ Cards regarding options avail...   =>  MEDS:  ASA81, Plavix75, Lisinopril5, Lasix40-2Qam, Diamox250Qpm, KCl20Bid, KPhos-1Bid 10/15> we tried to add Diamox250 & K10 but neither were started as requested in the NH... 11/15> she is now home again, we have ordered Diamox250 & K20Bid to be added to her meds=> in follow up it was apparent that she never filled these Rxs. 12/15> Pt is not capable of doing her own meds, son who lives w/ pt & daughter who brings her to OVs are similarly incapable of providing this supervision, I have talked w/ Jackelyn Poling at [place]. 1/16> she appears to be taking her meds properly at this point but still has 3-4+edema & wt w/o change; Labs improved, therefore incr Lasix to $Remove'80mg'FsmhQFJ$ Qam... 4/16> stable on meds w/ Lasix80, Diamox250, KCl & KPhos; continue same + no salt, elevation, support hose, etc... 6/16> stable on same meds, asked to elim all sodium etc... 10/16> went to ER w/ dysuria & weakness; Troponin elev at 0.7, eval by DrBerry & no changes made- rec home care w/ hospice... We stopped Lisin5 due to BP=94/50  AR & ASTHMA>  Breathing is stable, at baseline, she has chr rhinorrhea complaints w/ numerous discussions regarding management of this problem....  Ven Insuffic/ Edema>>  We have had mult conversations at each [place] regarding no salt, elevation, support hose; she developed marked increase edema off the Demadex/ Diamox after hosp 9/15 and never filled the Lasix Rx after the 10/15 hosp; we are adjusting diuretics again as above... 11/16> stable on ASA81, Plavix75, Lasix40-2Qam, Diamox250Qpm, [place] with labs all wnl...  CHOL>  She has the world's best HDL!!!  Electrolye Abn>  See 10/13 Hosp by [facility], it is apparent that her elev calcium is the result of HYPERPARATHYROIDISM but at age 32 we are  reluctant to refer for surg unless progressive... We are following her serum Calcium level & non-progressive. Hypercalcemia>  Prob from Hyperpara & she has stopped the calcium supplements, & we are following the labs...  GU>  She saw DrTannenbaum w/ extensive investigation into her voiding symptoms & urodynamics as above; rec to use "double voiding" technique... 8/15> still complaining & declines ret to Urology; try Ditropan $RemoveBefo'5mg'tWzoPZcBgWB$ - 1/2 tab Bid... 7/16> she presented w/ UTI & voiding symptoms; DrWert treated w/ Cipro, held Oxybutynin; Urine grew Viridans Strep, voiding symptoms persisted- rec to restart Oxybut$RemoveBeforeDE'5mg'WDzqKGUdxZOGOZR$ - 1/2Bid... 10/16> seen in ER w/ dysuria, uses depends, UA was clear...  DJD/ Osteopenia>  Aware, she is stable on OTC meds, she has repeatedly refuse Bisphos meds for her bones...  TIA, Dementia>  On ASA81, Plavix75; She & husb Jessica [name] are still living independently w/ daugh help but they are very fragile yet won't consider NHP...  Other medical problems as noted => 4/16 her Hg was down to 10.3 w/ MCV= 81; rec to start Prilosec20 before dinner & FeSO4 $Remov'325mg'HUHhNP$  daily in AM...   Patient's Medications  New Prescriptions   No medications on file  Previous Medications   ACETAZOLAMIDE (DIAMOX) 250 MG TABLET    TAKE 1 TABLET (250 MG TOTAL) BY MOUTH DAILY. AT 4 PM   ALPRAZOLAM (XANAX) 0.25 MG TABLET  Take 0.125-0.25 mg by mouth 3 (three) times daily as needed. For nerves   ASPIRIN EC 81 MG TABLET    Take 81 mg by mouth daily.   CLOPIDOGREL (PLAVIX) 75 MG TABLET    TAKE 1 TABLET BY MOUTH EVERY DAY   DONEPEZIL (ARICEPT) 10 MG TABLET    Take 1 tablet (10 mg total) by mouth at bedtime.   FE BISGLY-VIT C-VIT B12-FA (GENTLE IRON) 28-60-0.008-0.4 MG CAPS    Take 1 capsule by mouth daily.   FEEDING SUPPLEMENT (ENSURE COMPLETE) LIQD    Take 237 mLs by mouth 2 (two) times daily between meals.   FUROSEMIDE (LASIX) 40 MG TABLET    TAKE 2 TABLETS BY MOUTH EVERY MORNING   GABAPENTIN (NEURONTIN) 100 MG  CAPSULE    TAKE ONE CAPSULE BY MOUTH 3 TIMES A DAY   KLOR-CON M20 20 MEQ TABLET    TAKE 1 TABLET (20 MEQ TOTAL) BY MOUTH 2 (TWO) TIMES DAILY.   MULTIPLE VITAMIN (MULTIVITAMIN) TABLET    Take 1 tablet by mouth daily.     NITROGLYCERIN (NITROSTAT) 0.4 MG SL TABLET    Place 1 tablet (0.4 mg total) under the tongue every 5 (five) minutes x 3 doses as needed for chest pain.   PHOSPHA 250 NEUTRAL 155-852-130 MG TABLET    TAKE 1 TABLET BY MOUTH 2 (TWO) TIMES DAILY.   POLYETHYLENE GLYCOL (MIRALAX / GLYCOLAX) PACKET    Take 17 g by mouth daily.  Modified Medications   No medications on file  Discontinued Medications   CIPROFLOXACIN (CIPRO) 500 MG TABLET    One half twice daily x 5 days   LISINOPRIL (PRINIVIL,ZESTRIL) 5 MG TABLET    TAKE 1 TABLET (5 MG TOTAL) BY MOUTH DAILY.   OXYBUTYNIN (DITROPAN) 5 MG TABLET    Take 1 tablet (5 mg total) by mouth 2 (two) times daily.

## 2015-10-24 NOTE — Patient Instructions (Signed)
Today we updated your med list in our EPIC system...    We decided to STOP your LISINOPRIL since your BP is too low...  Continue your other meds the same...  Use OTC TYLENOL as needed for aches and pains...  Hospice will be helping you out at home...    If you are having problems- call the hospice team first...  Call for any questions...  Let's plan a follow up visit in 24mo, sooner if needed for problems.Marland KitchenMarland Kitchen

## 2015-10-25 DIAGNOSIS — I10 Essential (primary) hypertension: Secondary | ICD-10-CM | POA: Diagnosis not present

## 2015-10-25 DIAGNOSIS — I509 Heart failure, unspecified: Secondary | ICD-10-CM | POA: Diagnosis not present

## 2015-10-25 DIAGNOSIS — N189 Chronic kidney disease, unspecified: Secondary | ICD-10-CM | POA: Diagnosis not present

## 2015-10-25 DIAGNOSIS — I519 Heart disease, unspecified: Secondary | ICD-10-CM | POA: Diagnosis not present

## 2015-10-25 DIAGNOSIS — R001 Bradycardia, unspecified: Secondary | ICD-10-CM | POA: Diagnosis not present

## 2015-10-25 DIAGNOSIS — I2510 Atherosclerotic heart disease of native coronary artery without angina pectoris: Secondary | ICD-10-CM | POA: Diagnosis not present

## 2015-10-27 DIAGNOSIS — N189 Chronic kidney disease, unspecified: Secondary | ICD-10-CM | POA: Diagnosis not present

## 2015-10-27 DIAGNOSIS — I10 Essential (primary) hypertension: Secondary | ICD-10-CM | POA: Diagnosis not present

## 2015-10-27 DIAGNOSIS — I519 Heart disease, unspecified: Secondary | ICD-10-CM | POA: Diagnosis not present

## 2015-10-27 DIAGNOSIS — I509 Heart failure, unspecified: Secondary | ICD-10-CM | POA: Diagnosis not present

## 2015-10-27 DIAGNOSIS — I2510 Atherosclerotic heart disease of native coronary artery without angina pectoris: Secondary | ICD-10-CM | POA: Diagnosis not present

## 2015-10-27 DIAGNOSIS — R001 Bradycardia, unspecified: Secondary | ICD-10-CM | POA: Diagnosis not present

## 2015-11-01 DIAGNOSIS — I519 Heart disease, unspecified: Secondary | ICD-10-CM | POA: Diagnosis not present

## 2015-11-01 DIAGNOSIS — I509 Heart failure, unspecified: Secondary | ICD-10-CM | POA: Diagnosis not present

## 2015-11-01 DIAGNOSIS — R001 Bradycardia, unspecified: Secondary | ICD-10-CM | POA: Diagnosis not present

## 2015-11-01 DIAGNOSIS — N189 Chronic kidney disease, unspecified: Secondary | ICD-10-CM | POA: Diagnosis not present

## 2015-11-01 DIAGNOSIS — I2510 Atherosclerotic heart disease of native coronary artery without angina pectoris: Secondary | ICD-10-CM | POA: Diagnosis not present

## 2015-11-01 DIAGNOSIS — I10 Essential (primary) hypertension: Secondary | ICD-10-CM | POA: Diagnosis not present

## 2015-11-03 DIAGNOSIS — I10 Essential (primary) hypertension: Secondary | ICD-10-CM | POA: Diagnosis not present

## 2015-11-03 DIAGNOSIS — I519 Heart disease, unspecified: Secondary | ICD-10-CM | POA: Diagnosis not present

## 2015-11-03 DIAGNOSIS — I2510 Atherosclerotic heart disease of native coronary artery without angina pectoris: Secondary | ICD-10-CM | POA: Diagnosis not present

## 2015-11-03 DIAGNOSIS — I509 Heart failure, unspecified: Secondary | ICD-10-CM | POA: Diagnosis not present

## 2015-11-03 DIAGNOSIS — N189 Chronic kidney disease, unspecified: Secondary | ICD-10-CM | POA: Diagnosis not present

## 2015-11-03 DIAGNOSIS — R001 Bradycardia, unspecified: Secondary | ICD-10-CM | POA: Diagnosis not present

## 2015-11-10 DIAGNOSIS — I2510 Atherosclerotic heart disease of native coronary artery without angina pectoris: Secondary | ICD-10-CM | POA: Diagnosis not present

## 2015-11-10 DIAGNOSIS — N189 Chronic kidney disease, unspecified: Secondary | ICD-10-CM | POA: Diagnosis not present

## 2015-11-10 DIAGNOSIS — I509 Heart failure, unspecified: Secondary | ICD-10-CM | POA: Diagnosis not present

## 2015-11-10 DIAGNOSIS — I519 Heart disease, unspecified: Secondary | ICD-10-CM | POA: Diagnosis not present

## 2015-11-10 DIAGNOSIS — I10 Essential (primary) hypertension: Secondary | ICD-10-CM | POA: Diagnosis not present

## 2015-11-10 DIAGNOSIS — R001 Bradycardia, unspecified: Secondary | ICD-10-CM | POA: Diagnosis not present

## 2015-11-11 ENCOUNTER — Other Ambulatory Visit: Payer: Self-pay | Admitting: Pulmonary Disease

## 2015-11-15 DIAGNOSIS — I509 Heart failure, unspecified: Secondary | ICD-10-CM | POA: Diagnosis not present

## 2015-11-15 DIAGNOSIS — R001 Bradycardia, unspecified: Secondary | ICD-10-CM | POA: Diagnosis not present

## 2015-11-15 DIAGNOSIS — I519 Heart disease, unspecified: Secondary | ICD-10-CM | POA: Diagnosis not present

## 2015-11-15 DIAGNOSIS — I2510 Atherosclerotic heart disease of native coronary artery without angina pectoris: Secondary | ICD-10-CM | POA: Diagnosis not present

## 2015-11-15 DIAGNOSIS — N189 Chronic kidney disease, unspecified: Secondary | ICD-10-CM | POA: Diagnosis not present

## 2015-11-15 DIAGNOSIS — I10 Essential (primary) hypertension: Secondary | ICD-10-CM | POA: Diagnosis not present

## 2015-11-16 DIAGNOSIS — I509 Heart failure, unspecified: Secondary | ICD-10-CM | POA: Diagnosis not present

## 2015-11-16 DIAGNOSIS — N189 Chronic kidney disease, unspecified: Secondary | ICD-10-CM | POA: Diagnosis not present

## 2015-11-16 DIAGNOSIS — G309 Alzheimer's disease, unspecified: Secondary | ICD-10-CM | POA: Diagnosis not present

## 2015-11-16 DIAGNOSIS — I10 Essential (primary) hypertension: Secondary | ICD-10-CM | POA: Diagnosis not present

## 2015-11-16 DIAGNOSIS — I519 Heart disease, unspecified: Secondary | ICD-10-CM | POA: Diagnosis not present

## 2015-11-16 DIAGNOSIS — I2510 Atherosclerotic heart disease of native coronary artery without angina pectoris: Secondary | ICD-10-CM | POA: Diagnosis not present

## 2015-11-16 DIAGNOSIS — R001 Bradycardia, unspecified: Secondary | ICD-10-CM | POA: Diagnosis not present

## 2015-11-17 DIAGNOSIS — I10 Essential (primary) hypertension: Secondary | ICD-10-CM | POA: Diagnosis not present

## 2015-11-17 DIAGNOSIS — I509 Heart failure, unspecified: Secondary | ICD-10-CM | POA: Diagnosis not present

## 2015-11-17 DIAGNOSIS — N189 Chronic kidney disease, unspecified: Secondary | ICD-10-CM | POA: Diagnosis not present

## 2015-11-17 DIAGNOSIS — I2510 Atherosclerotic heart disease of native coronary artery without angina pectoris: Secondary | ICD-10-CM | POA: Diagnosis not present

## 2015-11-17 DIAGNOSIS — I519 Heart disease, unspecified: Secondary | ICD-10-CM | POA: Diagnosis not present

## 2015-11-17 DIAGNOSIS — R001 Bradycardia, unspecified: Secondary | ICD-10-CM | POA: Diagnosis not present

## 2015-11-21 ENCOUNTER — Ambulatory Visit: Payer: Medicare PPO | Admitting: Podiatry

## 2015-11-24 DIAGNOSIS — I509 Heart failure, unspecified: Secondary | ICD-10-CM | POA: Diagnosis not present

## 2015-11-24 DIAGNOSIS — N189 Chronic kidney disease, unspecified: Secondary | ICD-10-CM | POA: Diagnosis not present

## 2015-11-24 DIAGNOSIS — R001 Bradycardia, unspecified: Secondary | ICD-10-CM | POA: Diagnosis not present

## 2015-11-24 DIAGNOSIS — I10 Essential (primary) hypertension: Secondary | ICD-10-CM | POA: Diagnosis not present

## 2015-11-24 DIAGNOSIS — I519 Heart disease, unspecified: Secondary | ICD-10-CM | POA: Diagnosis not present

## 2015-11-24 DIAGNOSIS — I2510 Atherosclerotic heart disease of native coronary artery without angina pectoris: Secondary | ICD-10-CM | POA: Diagnosis not present

## 2015-11-30 ENCOUNTER — Ambulatory Visit: Payer: Medicare PPO | Admitting: Internal Medicine

## 2015-11-30 DIAGNOSIS — I2510 Atherosclerotic heart disease of native coronary artery without angina pectoris: Secondary | ICD-10-CM | POA: Diagnosis not present

## 2015-11-30 DIAGNOSIS — I10 Essential (primary) hypertension: Secondary | ICD-10-CM | POA: Diagnosis not present

## 2015-11-30 DIAGNOSIS — N189 Chronic kidney disease, unspecified: Secondary | ICD-10-CM | POA: Diagnosis not present

## 2015-11-30 DIAGNOSIS — R001 Bradycardia, unspecified: Secondary | ICD-10-CM | POA: Diagnosis not present

## 2015-11-30 DIAGNOSIS — I509 Heart failure, unspecified: Secondary | ICD-10-CM | POA: Diagnosis not present

## 2015-11-30 DIAGNOSIS — I519 Heart disease, unspecified: Secondary | ICD-10-CM | POA: Diagnosis not present

## 2015-12-01 DIAGNOSIS — I509 Heart failure, unspecified: Secondary | ICD-10-CM | POA: Diagnosis not present

## 2015-12-01 DIAGNOSIS — I2510 Atherosclerotic heart disease of native coronary artery without angina pectoris: Secondary | ICD-10-CM | POA: Diagnosis not present

## 2015-12-01 DIAGNOSIS — I10 Essential (primary) hypertension: Secondary | ICD-10-CM | POA: Diagnosis not present

## 2015-12-01 DIAGNOSIS — R001 Bradycardia, unspecified: Secondary | ICD-10-CM | POA: Diagnosis not present

## 2015-12-01 DIAGNOSIS — N189 Chronic kidney disease, unspecified: Secondary | ICD-10-CM | POA: Diagnosis not present

## 2015-12-01 DIAGNOSIS — I519 Heart disease, unspecified: Secondary | ICD-10-CM | POA: Diagnosis not present

## 2015-12-07 DIAGNOSIS — R001 Bradycardia, unspecified: Secondary | ICD-10-CM | POA: Diagnosis not present

## 2015-12-07 DIAGNOSIS — N189 Chronic kidney disease, unspecified: Secondary | ICD-10-CM | POA: Diagnosis not present

## 2015-12-07 DIAGNOSIS — I519 Heart disease, unspecified: Secondary | ICD-10-CM | POA: Diagnosis not present

## 2015-12-07 DIAGNOSIS — I2510 Atherosclerotic heart disease of native coronary artery without angina pectoris: Secondary | ICD-10-CM | POA: Diagnosis not present

## 2015-12-07 DIAGNOSIS — I509 Heart failure, unspecified: Secondary | ICD-10-CM | POA: Diagnosis not present

## 2015-12-07 DIAGNOSIS — I10 Essential (primary) hypertension: Secondary | ICD-10-CM | POA: Diagnosis not present

## 2015-12-08 DIAGNOSIS — R001 Bradycardia, unspecified: Secondary | ICD-10-CM | POA: Diagnosis not present

## 2015-12-08 DIAGNOSIS — N189 Chronic kidney disease, unspecified: Secondary | ICD-10-CM | POA: Diagnosis not present

## 2015-12-08 DIAGNOSIS — I2510 Atherosclerotic heart disease of native coronary artery without angina pectoris: Secondary | ICD-10-CM | POA: Diagnosis not present

## 2015-12-08 DIAGNOSIS — I509 Heart failure, unspecified: Secondary | ICD-10-CM | POA: Diagnosis not present

## 2015-12-08 DIAGNOSIS — I10 Essential (primary) hypertension: Secondary | ICD-10-CM | POA: Diagnosis not present

## 2015-12-08 DIAGNOSIS — I519 Heart disease, unspecified: Secondary | ICD-10-CM | POA: Diagnosis not present

## 2015-12-13 DIAGNOSIS — R001 Bradycardia, unspecified: Secondary | ICD-10-CM | POA: Diagnosis not present

## 2015-12-13 DIAGNOSIS — I10 Essential (primary) hypertension: Secondary | ICD-10-CM | POA: Diagnosis not present

## 2015-12-13 DIAGNOSIS — I519 Heart disease, unspecified: Secondary | ICD-10-CM | POA: Diagnosis not present

## 2015-12-13 DIAGNOSIS — I2510 Atherosclerotic heart disease of native coronary artery without angina pectoris: Secondary | ICD-10-CM | POA: Diagnosis not present

## 2015-12-13 DIAGNOSIS — N189 Chronic kidney disease, unspecified: Secondary | ICD-10-CM | POA: Diagnosis not present

## 2015-12-13 DIAGNOSIS — I509 Heart failure, unspecified: Secondary | ICD-10-CM | POA: Diagnosis not present

## 2015-12-15 DIAGNOSIS — I2510 Atherosclerotic heart disease of native coronary artery without angina pectoris: Secondary | ICD-10-CM | POA: Diagnosis not present

## 2015-12-15 DIAGNOSIS — R001 Bradycardia, unspecified: Secondary | ICD-10-CM | POA: Diagnosis not present

## 2015-12-15 DIAGNOSIS — I509 Heart failure, unspecified: Secondary | ICD-10-CM | POA: Diagnosis not present

## 2015-12-15 DIAGNOSIS — I10 Essential (primary) hypertension: Secondary | ICD-10-CM | POA: Diagnosis not present

## 2015-12-15 DIAGNOSIS — N189 Chronic kidney disease, unspecified: Secondary | ICD-10-CM | POA: Diagnosis not present

## 2015-12-15 DIAGNOSIS — I519 Heart disease, unspecified: Secondary | ICD-10-CM | POA: Diagnosis not present

## 2015-12-17 DIAGNOSIS — G309 Alzheimer's disease, unspecified: Secondary | ICD-10-CM | POA: Diagnosis not present

## 2015-12-17 DIAGNOSIS — N189 Chronic kidney disease, unspecified: Secondary | ICD-10-CM | POA: Diagnosis not present

## 2015-12-17 DIAGNOSIS — I509 Heart failure, unspecified: Secondary | ICD-10-CM | POA: Diagnosis not present

## 2015-12-17 DIAGNOSIS — R001 Bradycardia, unspecified: Secondary | ICD-10-CM | POA: Diagnosis not present

## 2015-12-17 DIAGNOSIS — I519 Heart disease, unspecified: Secondary | ICD-10-CM | POA: Diagnosis not present

## 2015-12-17 DIAGNOSIS — I10 Essential (primary) hypertension: Secondary | ICD-10-CM | POA: Diagnosis not present

## 2015-12-17 DIAGNOSIS — I2510 Atherosclerotic heart disease of native coronary artery without angina pectoris: Secondary | ICD-10-CM | POA: Diagnosis not present

## 2015-12-19 ENCOUNTER — Ambulatory Visit (INDEPENDENT_AMBULATORY_CARE_PROVIDER_SITE_OTHER): Payer: Medicare PPO | Admitting: Cardiovascular Disease

## 2015-12-19 ENCOUNTER — Encounter: Payer: Self-pay | Admitting: Cardiovascular Disease

## 2015-12-19 VITALS — BP 120/62 | HR 51 | Ht 65.0 in | Wt 102.0 lb

## 2015-12-19 DIAGNOSIS — I519 Heart disease, unspecified: Secondary | ICD-10-CM | POA: Diagnosis not present

## 2015-12-19 DIAGNOSIS — I2510 Atherosclerotic heart disease of native coronary artery without angina pectoris: Secondary | ICD-10-CM | POA: Diagnosis not present

## 2015-12-19 DIAGNOSIS — I10 Essential (primary) hypertension: Secondary | ICD-10-CM

## 2015-12-19 DIAGNOSIS — R001 Bradycardia, unspecified: Secondary | ICD-10-CM | POA: Diagnosis not present

## 2015-12-19 DIAGNOSIS — I5042 Chronic combined systolic (congestive) and diastolic (congestive) heart failure: Secondary | ICD-10-CM

## 2015-12-19 DIAGNOSIS — I509 Heart failure, unspecified: Secondary | ICD-10-CM | POA: Diagnosis not present

## 2015-12-19 DIAGNOSIS — N189 Chronic kidney disease, unspecified: Secondary | ICD-10-CM | POA: Diagnosis not present

## 2015-12-19 NOTE — Assessment & Plan Note (Signed)
History of hypertension blood pressure measures 120/62. She is not on antihypertensive medications.

## 2015-12-19 NOTE — Progress Notes (Signed)
12/19/2015 Jessica [name]   26-Feb-1911  [id number]  Primary Physician Myrlene Broker, MD Primary Cardiologist: Runell Gess MD [name]   HPI:  Jessica [name] is a 82 year old frail appearing married African-American female who lives with her 84 year old husband and accompanied by her daughter Jessica [name] today. I last saw her in the office 01/13/15.She was last seen here in October by Franky Macho every She was hospitalized several times last year for congestive heart failure. She is a DO NOT RESUSCITATE. Her EF is 45-50% by 2-D echo. Her other problems include chronic lower extremity edema. She does walk with the aid of a walker at home. She denies chest pain or shortness of breath. Problems include a history of hypertension, hyperlipidemia.   Current Outpatient Prescriptions  Medication Sig Dispense Refill  . acetaZOLAMIDE (DIAMOX) 250 MG tablet TAKE 1 TABLET (250 MG TOTAL) BY MOUTH DAILY. AT 4 PM 30 tablet 6  . ALPRAZolam (XANAX) 0.25 MG tablet Take 0.125-0.25 mg by mouth 3 (three) times daily as needed. For nerves    . aspirin EC 81 MG tablet Take 81 mg by mouth daily.    . clopidogrel (PLAVIX) 75 MG tablet TAKE 1 TABLET BY MOUTH EVERY DAY 30 tablet 5  . donepezil (ARICEPT) 10 MG tablet Take 1 tablet (10 mg total) by mouth at bedtime. 30 tablet 5  . Fe Bisgly-Vit C-Vit B12-FA (GENTLE IRON) 28-60-0.008-0.4 MG CAPS Take 1 capsule by mouth daily. 30 each 0  . feeding supplement (ENSURE COMPLETE) LIQD Take 237 mLs by mouth 2 (two) times daily between meals. 60 Bottle 0  . furosemide (LASIX) 40 MG tablet TAKE 2 TABLETS BY MOUTH EVERY MORNING 60 tablet 3  . furosemide (LASIX) 40 MG tablet TAKE 2 TABLETS BY MOUTH EVERY MORNING 60 tablet 1  . gabapentin (NEURONTIN) 100 MG capsule TAKE ONE CAPSULE BY MOUTH 3 TIMES A DAY 90 capsule 1  . KLOR-CON M20 20 MEQ tablet TAKE 1 TABLET (20 MEQ TOTAL) BY MOUTH 2 (TWO) TIMES DAILY. 60 tablet 6  . Multiple Vitamin (MULTIVITAMIN) tablet Take 1  tablet by mouth daily.      . nitroGLYCERIN (NITROSTAT) 0.4 MG SL tablet Place 1 tablet (0.4 mg total) under the tongue every 5 (five) minutes x 3 doses as needed for chest pain. 25 tablet 1  . PHOSPHA 250 NEUTRAL 155-852-130 MG tablet TAKE 1 TABLET BY MOUTH 2 (TWO) TIMES DAILY. 180 tablet 1  . polyethylene glycol (MIRALAX / GLYCOLAX) packet Take 17 g by mouth daily.     No current facility-administered medications for this visit.    No Known Allergies  Social History   Social History  . Marital Status: Married    Spouse Name: cecil  . Number of Children: 2  . Years of Education: N/A   Occupational History  . Retired Database administrator; Manufacturing engineer   Social History Main Topics  . Smoking status: Never Smoker   . Smokeless tobacco: Never Used  . Alcohol Use: No  . Drug Use: No  . Sexual Activity: Not on file   Other Topics Concern  . Not on file   Social History Narrative     Review of Systems: General: negative for chills, fever, night sweats or weight changes.  Cardiovascular: negative for chest pain, dyspnea on exertion, edema, orthopnea, palpitations, paroxysmal nocturnal dyspnea or shortness of breath Dermatological: negative for rash Respiratory: negative for cough or wheezing Urologic: negative for hematuria Abdominal: negative  for nausea, vomiting, diarrhea, bright red blood per rectum, melena, or hematemesis Neurologic: negative for visual changes, syncope, or dizziness All other systems reviewed and are otherwise negative except as noted above.    Blood pressure 120/62, pulse 51, height 5\' 5"  (1.651 m), weight 102 lb (46.267 kg).  General appearance: alert and no distress Neck: no adenopathy, no carotid bruit, no JVD, supple, symmetrical, trachea midline and thyroid not enlarged, symmetric, no tenderness/mass/nodules Lungs: clear to auscultation bilaterally Heart: regular rate and rhythm, S1, S2 normal, no murmur, click, rub or  gallop Extremities: extremities normal, atraumatic, no cyanosis or edema  EKG not performed today  ASSESSMENT AND PLAN:   Essential hypertension History of hypertension blood pressure measures 120/62. She is not on antihypertensive medications.  Chronic combined systolic and diastolic CHF, NYHA class 3 (HCC) Narasimhan has an ejection fraction of 45-50%. For all intents and  purposes wheelchair bound and denies chest pain or shortness of breath. She is on furosemide.      Runell Gess MD FACP,FACC,FAHA, [facility] 12/19/2015 3:17 PM

## 2015-12-19 NOTE — Patient Instructions (Signed)

## 2015-12-19 NOTE — Assessment & Plan Note (Signed)
Ing has an ejection fraction of 45-50%. For all intents and  purposes wheelchair bound and denies chest pain or shortness of breath. She is on furosemide.

## 2015-12-21 DIAGNOSIS — I10 Essential (primary) hypertension: Secondary | ICD-10-CM | POA: Diagnosis not present

## 2015-12-21 DIAGNOSIS — I2510 Atherosclerotic heart disease of native coronary artery without angina pectoris: Secondary | ICD-10-CM | POA: Diagnosis not present

## 2015-12-21 DIAGNOSIS — N189 Chronic kidney disease, unspecified: Secondary | ICD-10-CM | POA: Diagnosis not present

## 2015-12-21 DIAGNOSIS — I509 Heart failure, unspecified: Secondary | ICD-10-CM | POA: Diagnosis not present

## 2015-12-21 DIAGNOSIS — I519 Heart disease, unspecified: Secondary | ICD-10-CM | POA: Diagnosis not present

## 2015-12-21 DIAGNOSIS — R001 Bradycardia, unspecified: Secondary | ICD-10-CM | POA: Diagnosis not present

## 2015-12-22 ENCOUNTER — Ambulatory Visit (INDEPENDENT_AMBULATORY_CARE_PROVIDER_SITE_OTHER): Payer: Medicare PPO | Admitting: Podiatry

## 2015-12-22 ENCOUNTER — Other Ambulatory Visit: Payer: Self-pay | Admitting: Pulmonary Disease

## 2015-12-22 DIAGNOSIS — R001 Bradycardia, unspecified: Secondary | ICD-10-CM | POA: Diagnosis not present

## 2015-12-22 DIAGNOSIS — M79673 Pain in unspecified foot: Secondary | ICD-10-CM

## 2015-12-22 DIAGNOSIS — I10 Essential (primary) hypertension: Secondary | ICD-10-CM | POA: Diagnosis not present

## 2015-12-22 DIAGNOSIS — I509 Heart failure, unspecified: Secondary | ICD-10-CM | POA: Diagnosis not present

## 2015-12-22 DIAGNOSIS — B351 Tinea unguium: Secondary | ICD-10-CM | POA: Diagnosis not present

## 2015-12-22 DIAGNOSIS — N189 Chronic kidney disease, unspecified: Secondary | ICD-10-CM | POA: Diagnosis not present

## 2015-12-22 DIAGNOSIS — I2510 Atherosclerotic heart disease of native coronary artery without angina pectoris: Secondary | ICD-10-CM | POA: Diagnosis not present

## 2015-12-22 DIAGNOSIS — I519 Heart disease, unspecified: Secondary | ICD-10-CM | POA: Diagnosis not present

## 2015-12-22 NOTE — Progress Notes (Signed)
Patient ID: Jessica [name], female   DOB: Jun 06, 1911, 82 y.o.   MRN: [id number] Complaint:  Visit Type: Patient returns to my office for continued preventative foot care services. Complaint: Patient states" my nails have grown long and thick and become painful to walk and wear shoes.. The patient presents for preventative foot care services. No changes to ROS  Podiatric Exam: Vascular: dorsalis pedis and posterior tibial pulses are not  palpable bilateral due to swelling. Capillary return is immediate.  Sensorium: Normal Semmes Weinstein monofilament test. Normal tactile sensation bilaterally. Nail Exam: Pt has thick disfigured discolored nails with subungual debris noted bilateral entire nail hallux through fifth toenails Ulcer Exam: There is no evidence of ulcer or pre-ulcerative changes or infection. Orthopedic Exam: Muscle tone and strength are WNL. No limitations in general ROM. No crepitus or effusions noted. Foot type and digits show no abnormalities. Bony prominences are unremarkable. Skin: No Porokeratosis. No infection or ulcers  Diagnosis:  Onychomycosis, , Pain in right toe, pain in left toes  Treatment & Plan Procedures and Treatment: Consent by patient was obtained for treatment procedures. The patient understood the discussion of treatment and procedures well. All questions were answered thoroughly reviewed. Debridement of mycotic and hypertrophic toenails, 1 through 5 bilateral and clearing of subungual debris. No ulceration, no infection noted.  Return Visit-Office Procedure: Patient instructed to return to the office for a follow up visit 3 months for continued evaluation and treatment.   Helane Gunther DPM

## 2015-12-28 DIAGNOSIS — I10 Essential (primary) hypertension: Secondary | ICD-10-CM | POA: Diagnosis not present

## 2015-12-28 DIAGNOSIS — I509 Heart failure, unspecified: Secondary | ICD-10-CM | POA: Diagnosis not present

## 2015-12-28 DIAGNOSIS — I519 Heart disease, unspecified: Secondary | ICD-10-CM | POA: Diagnosis not present

## 2015-12-28 DIAGNOSIS — N189 Chronic kidney disease, unspecified: Secondary | ICD-10-CM | POA: Diagnosis not present

## 2015-12-28 DIAGNOSIS — I2510 Atherosclerotic heart disease of native coronary artery without angina pectoris: Secondary | ICD-10-CM | POA: Diagnosis not present

## 2015-12-28 DIAGNOSIS — R001 Bradycardia, unspecified: Secondary | ICD-10-CM | POA: Diagnosis not present

## 2015-12-29 DIAGNOSIS — I509 Heart failure, unspecified: Secondary | ICD-10-CM | POA: Diagnosis not present

## 2015-12-29 DIAGNOSIS — R001 Bradycardia, unspecified: Secondary | ICD-10-CM | POA: Diagnosis not present

## 2015-12-29 DIAGNOSIS — I2510 Atherosclerotic heart disease of native coronary artery without angina pectoris: Secondary | ICD-10-CM | POA: Diagnosis not present

## 2015-12-29 DIAGNOSIS — I10 Essential (primary) hypertension: Secondary | ICD-10-CM | POA: Diagnosis not present

## 2015-12-29 DIAGNOSIS — N189 Chronic kidney disease, unspecified: Secondary | ICD-10-CM | POA: Diagnosis not present

## 2015-12-29 DIAGNOSIS — I519 Heart disease, unspecified: Secondary | ICD-10-CM | POA: Diagnosis not present

## 2016-01-01 DIAGNOSIS — I10 Essential (primary) hypertension: Secondary | ICD-10-CM | POA: Diagnosis not present

## 2016-01-01 DIAGNOSIS — I2510 Atherosclerotic heart disease of native coronary artery without angina pectoris: Secondary | ICD-10-CM | POA: Diagnosis not present

## 2016-01-01 DIAGNOSIS — R001 Bradycardia, unspecified: Secondary | ICD-10-CM | POA: Diagnosis not present

## 2016-01-01 DIAGNOSIS — N189 Chronic kidney disease, unspecified: Secondary | ICD-10-CM | POA: Diagnosis not present

## 2016-01-01 DIAGNOSIS — I519 Heart disease, unspecified: Secondary | ICD-10-CM | POA: Diagnosis not present

## 2016-01-01 DIAGNOSIS — I509 Heart failure, unspecified: Secondary | ICD-10-CM | POA: Diagnosis not present

## 2016-01-04 DIAGNOSIS — I519 Heart disease, unspecified: Secondary | ICD-10-CM | POA: Diagnosis not present

## 2016-01-04 DIAGNOSIS — I509 Heart failure, unspecified: Secondary | ICD-10-CM | POA: Diagnosis not present

## 2016-01-04 DIAGNOSIS — R001 Bradycardia, unspecified: Secondary | ICD-10-CM | POA: Diagnosis not present

## 2016-01-04 DIAGNOSIS — I10 Essential (primary) hypertension: Secondary | ICD-10-CM | POA: Diagnosis not present

## 2016-01-04 DIAGNOSIS — I2510 Atherosclerotic heart disease of native coronary artery without angina pectoris: Secondary | ICD-10-CM | POA: Diagnosis not present

## 2016-01-04 DIAGNOSIS — N189 Chronic kidney disease, unspecified: Secondary | ICD-10-CM | POA: Diagnosis not present

## 2016-01-05 DIAGNOSIS — I509 Heart failure, unspecified: Secondary | ICD-10-CM | POA: Diagnosis not present

## 2016-01-05 DIAGNOSIS — I2510 Atherosclerotic heart disease of native coronary artery without angina pectoris: Secondary | ICD-10-CM | POA: Diagnosis not present

## 2016-01-05 DIAGNOSIS — I10 Essential (primary) hypertension: Secondary | ICD-10-CM | POA: Diagnosis not present

## 2016-01-05 DIAGNOSIS — I519 Heart disease, unspecified: Secondary | ICD-10-CM | POA: Diagnosis not present

## 2016-01-05 DIAGNOSIS — R001 Bradycardia, unspecified: Secondary | ICD-10-CM | POA: Diagnosis not present

## 2016-01-05 DIAGNOSIS — N189 Chronic kidney disease, unspecified: Secondary | ICD-10-CM | POA: Diagnosis not present

## 2016-01-08 ENCOUNTER — Other Ambulatory Visit: Payer: Self-pay | Admitting: Pulmonary Disease

## 2016-01-11 DIAGNOSIS — I509 Heart failure, unspecified: Secondary | ICD-10-CM | POA: Diagnosis not present

## 2016-01-11 DIAGNOSIS — N189 Chronic kidney disease, unspecified: Secondary | ICD-10-CM | POA: Diagnosis not present

## 2016-01-11 DIAGNOSIS — R001 Bradycardia, unspecified: Secondary | ICD-10-CM | POA: Diagnosis not present

## 2016-01-11 DIAGNOSIS — I519 Heart disease, unspecified: Secondary | ICD-10-CM | POA: Diagnosis not present

## 2016-01-11 DIAGNOSIS — I2510 Atherosclerotic heart disease of native coronary artery without angina pectoris: Secondary | ICD-10-CM | POA: Diagnosis not present

## 2016-01-11 DIAGNOSIS — I10 Essential (primary) hypertension: Secondary | ICD-10-CM | POA: Diagnosis not present

## 2016-01-15 ENCOUNTER — Other Ambulatory Visit: Payer: Self-pay | Admitting: Pulmonary Disease

## 2016-01-15 DIAGNOSIS — I10 Essential (primary) hypertension: Secondary | ICD-10-CM | POA: Diagnosis not present

## 2016-01-15 DIAGNOSIS — R001 Bradycardia, unspecified: Secondary | ICD-10-CM | POA: Diagnosis not present

## 2016-01-15 DIAGNOSIS — I2510 Atherosclerotic heart disease of native coronary artery without angina pectoris: Secondary | ICD-10-CM | POA: Diagnosis not present

## 2016-01-15 DIAGNOSIS — N189 Chronic kidney disease, unspecified: Secondary | ICD-10-CM | POA: Diagnosis not present

## 2016-01-15 DIAGNOSIS — I509 Heart failure, unspecified: Secondary | ICD-10-CM | POA: Diagnosis not present

## 2016-01-15 DIAGNOSIS — I519 Heart disease, unspecified: Secondary | ICD-10-CM | POA: Diagnosis not present

## 2016-01-17 DIAGNOSIS — I519 Heart disease, unspecified: Secondary | ICD-10-CM | POA: Diagnosis not present

## 2016-01-17 DIAGNOSIS — I10 Essential (primary) hypertension: Secondary | ICD-10-CM | POA: Diagnosis not present

## 2016-01-17 DIAGNOSIS — I509 Heart failure, unspecified: Secondary | ICD-10-CM | POA: Diagnosis not present

## 2016-01-17 DIAGNOSIS — I2510 Atherosclerotic heart disease of native coronary artery without angina pectoris: Secondary | ICD-10-CM | POA: Diagnosis not present

## 2016-01-17 DIAGNOSIS — R001 Bradycardia, unspecified: Secondary | ICD-10-CM | POA: Diagnosis not present

## 2016-01-17 DIAGNOSIS — N189 Chronic kidney disease, unspecified: Secondary | ICD-10-CM | POA: Diagnosis not present

## 2016-01-17 DIAGNOSIS — G309 Alzheimer's disease, unspecified: Secondary | ICD-10-CM | POA: Diagnosis not present

## 2016-01-18 DIAGNOSIS — I519 Heart disease, unspecified: Secondary | ICD-10-CM | POA: Diagnosis not present

## 2016-01-18 DIAGNOSIS — I2510 Atherosclerotic heart disease of native coronary artery without angina pectoris: Secondary | ICD-10-CM | POA: Diagnosis not present

## 2016-01-18 DIAGNOSIS — N189 Chronic kidney disease, unspecified: Secondary | ICD-10-CM | POA: Diagnosis not present

## 2016-01-18 DIAGNOSIS — I10 Essential (primary) hypertension: Secondary | ICD-10-CM | POA: Diagnosis not present

## 2016-01-18 DIAGNOSIS — I509 Heart failure, unspecified: Secondary | ICD-10-CM | POA: Diagnosis not present

## 2016-01-18 DIAGNOSIS — R001 Bradycardia, unspecified: Secondary | ICD-10-CM | POA: Diagnosis not present

## 2016-01-20 ENCOUNTER — Other Ambulatory Visit: Payer: Self-pay | Admitting: Pulmonary Disease

## 2016-01-22 DIAGNOSIS — N189 Chronic kidney disease, unspecified: Secondary | ICD-10-CM | POA: Diagnosis not present

## 2016-01-22 DIAGNOSIS — I519 Heart disease, unspecified: Secondary | ICD-10-CM | POA: Diagnosis not present

## 2016-01-22 DIAGNOSIS — I10 Essential (primary) hypertension: Secondary | ICD-10-CM | POA: Diagnosis not present

## 2016-01-22 DIAGNOSIS — R001 Bradycardia, unspecified: Secondary | ICD-10-CM | POA: Diagnosis not present

## 2016-01-22 DIAGNOSIS — I509 Heart failure, unspecified: Secondary | ICD-10-CM | POA: Diagnosis not present

## 2016-01-22 DIAGNOSIS — I2510 Atherosclerotic heart disease of native coronary artery without angina pectoris: Secondary | ICD-10-CM | POA: Diagnosis not present

## 2016-01-25 DIAGNOSIS — N189 Chronic kidney disease, unspecified: Secondary | ICD-10-CM | POA: Diagnosis not present

## 2016-01-25 DIAGNOSIS — I519 Heart disease, unspecified: Secondary | ICD-10-CM | POA: Diagnosis not present

## 2016-01-25 DIAGNOSIS — I10 Essential (primary) hypertension: Secondary | ICD-10-CM | POA: Diagnosis not present

## 2016-01-25 DIAGNOSIS — I509 Heart failure, unspecified: Secondary | ICD-10-CM | POA: Diagnosis not present

## 2016-01-25 DIAGNOSIS — I2510 Atherosclerotic heart disease of native coronary artery without angina pectoris: Secondary | ICD-10-CM | POA: Diagnosis not present

## 2016-01-25 DIAGNOSIS — R001 Bradycardia, unspecified: Secondary | ICD-10-CM | POA: Diagnosis not present

## 2016-01-26 ENCOUNTER — Other Ambulatory Visit: Payer: Self-pay | Admitting: Pulmonary Disease

## 2016-01-26 DIAGNOSIS — I519 Heart disease, unspecified: Secondary | ICD-10-CM | POA: Diagnosis not present

## 2016-01-26 DIAGNOSIS — I509 Heart failure, unspecified: Secondary | ICD-10-CM | POA: Diagnosis not present

## 2016-01-26 DIAGNOSIS — I10 Essential (primary) hypertension: Secondary | ICD-10-CM | POA: Diagnosis not present

## 2016-01-26 DIAGNOSIS — N189 Chronic kidney disease, unspecified: Secondary | ICD-10-CM | POA: Diagnosis not present

## 2016-01-26 DIAGNOSIS — R001 Bradycardia, unspecified: Secondary | ICD-10-CM | POA: Diagnosis not present

## 2016-01-26 DIAGNOSIS — I2510 Atherosclerotic heart disease of native coronary artery without angina pectoris: Secondary | ICD-10-CM | POA: Diagnosis not present

## 2016-02-01 DIAGNOSIS — I509 Heart failure, unspecified: Secondary | ICD-10-CM | POA: Diagnosis not present

## 2016-02-01 DIAGNOSIS — I2510 Atherosclerotic heart disease of native coronary artery without angina pectoris: Secondary | ICD-10-CM | POA: Diagnosis not present

## 2016-02-01 DIAGNOSIS — I10 Essential (primary) hypertension: Secondary | ICD-10-CM | POA: Diagnosis not present

## 2016-02-01 DIAGNOSIS — N189 Chronic kidney disease, unspecified: Secondary | ICD-10-CM | POA: Diagnosis not present

## 2016-02-01 DIAGNOSIS — I519 Heart disease, unspecified: Secondary | ICD-10-CM | POA: Diagnosis not present

## 2016-02-01 DIAGNOSIS — R001 Bradycardia, unspecified: Secondary | ICD-10-CM | POA: Diagnosis not present

## 2016-02-08 DIAGNOSIS — I10 Essential (primary) hypertension: Secondary | ICD-10-CM | POA: Diagnosis not present

## 2016-02-08 DIAGNOSIS — I519 Heart disease, unspecified: Secondary | ICD-10-CM | POA: Diagnosis not present

## 2016-02-08 DIAGNOSIS — I2510 Atherosclerotic heart disease of native coronary artery without angina pectoris: Secondary | ICD-10-CM | POA: Diagnosis not present

## 2016-02-08 DIAGNOSIS — N189 Chronic kidney disease, unspecified: Secondary | ICD-10-CM | POA: Diagnosis not present

## 2016-02-08 DIAGNOSIS — R001 Bradycardia, unspecified: Secondary | ICD-10-CM | POA: Diagnosis not present

## 2016-02-08 DIAGNOSIS — I509 Heart failure, unspecified: Secondary | ICD-10-CM | POA: Diagnosis not present

## 2016-02-10 DIAGNOSIS — I2510 Atherosclerotic heart disease of native coronary artery without angina pectoris: Secondary | ICD-10-CM | POA: Diagnosis not present

## 2016-02-10 DIAGNOSIS — I519 Heart disease, unspecified: Secondary | ICD-10-CM | POA: Diagnosis not present

## 2016-02-10 DIAGNOSIS — I10 Essential (primary) hypertension: Secondary | ICD-10-CM | POA: Diagnosis not present

## 2016-02-10 DIAGNOSIS — R001 Bradycardia, unspecified: Secondary | ICD-10-CM | POA: Diagnosis not present

## 2016-02-10 DIAGNOSIS — N189 Chronic kidney disease, unspecified: Secondary | ICD-10-CM | POA: Diagnosis not present

## 2016-02-10 DIAGNOSIS — I509 Heart failure, unspecified: Secondary | ICD-10-CM | POA: Diagnosis not present

## 2016-02-12 ENCOUNTER — Encounter: Payer: Self-pay | Admitting: Internal Medicine

## 2016-02-12 ENCOUNTER — Ambulatory Visit (INDEPENDENT_AMBULATORY_CARE_PROVIDER_SITE_OTHER): Payer: Medicare PPO | Admitting: Internal Medicine

## 2016-02-12 VITALS — BP 120/80 | HR 50 | Temp 98.9°F | Resp 12 | Ht 63.0 in | Wt 106.2 lb

## 2016-02-12 DIAGNOSIS — R32 Unspecified urinary incontinence: Secondary | ICD-10-CM | POA: Diagnosis not present

## 2016-02-12 DIAGNOSIS — I2510 Atherosclerotic heart disease of native coronary artery without angina pectoris: Secondary | ICD-10-CM | POA: Diagnosis not present

## 2016-02-12 DIAGNOSIS — N189 Chronic kidney disease, unspecified: Secondary | ICD-10-CM | POA: Diagnosis not present

## 2016-02-12 DIAGNOSIS — I10 Essential (primary) hypertension: Secondary | ICD-10-CM

## 2016-02-12 DIAGNOSIS — R001 Bradycardia, unspecified: Secondary | ICD-10-CM | POA: Diagnosis not present

## 2016-02-12 DIAGNOSIS — K219 Gastro-esophageal reflux disease without esophagitis: Secondary | ICD-10-CM

## 2016-02-12 DIAGNOSIS — I519 Heart disease, unspecified: Secondary | ICD-10-CM | POA: Diagnosis not present

## 2016-02-12 DIAGNOSIS — I509 Heart failure, unspecified: Secondary | ICD-10-CM | POA: Diagnosis not present

## 2016-02-12 MED ORDER — FUROSEMIDE 40 MG PO TABS: 40.0000 mg | ORAL_TABLET | Freq: Every day | ORAL | Status: DC

## 2016-02-12 MED ORDER — MIRABEGRON ER 50 MG PO TB24: 50.0000 mg | ORAL_TABLET | Freq: Every day | ORAL | Status: DC

## 2016-02-12 MED ORDER — POTASSIUM CHLORIDE CRYS ER 20 MEQ PO TBCR: 20.0000 meq | EXTENDED_RELEASE_TABLET | Freq: Every day | ORAL | Status: DC

## 2016-02-12 NOTE — Patient Instructions (Addendum)
We would like you to stop the acetazolamide (diamox) as this is a mild fluid pill that I don't think you need. Since you are not taking this you can stop taking the phosph-neutral.   We will have you decrease the lasix (furosemide) to 1 pill in the morning instead of 2 pills. Since we are decreasing this you only need to take the potassium once daily instead of twice a day.   We have sent in a medicine for the bladder called myrbetriq. Take 1 pill daily and in 2-3 weeks it can start helping with the bladder. It does not have many side effects.

## 2016-02-12 NOTE — Progress Notes (Signed)
Pre visit review using our clinic review tool, if applicable. No additional management support is needed unless otherwise documented below in the visit note. 

## 2016-02-14 DIAGNOSIS — R001 Bradycardia, unspecified: Secondary | ICD-10-CM | POA: Diagnosis not present

## 2016-02-14 DIAGNOSIS — I2510 Atherosclerotic heart disease of native coronary artery without angina pectoris: Secondary | ICD-10-CM | POA: Diagnosis not present

## 2016-02-14 DIAGNOSIS — G309 Alzheimer's disease, unspecified: Secondary | ICD-10-CM | POA: Diagnosis not present

## 2016-02-14 DIAGNOSIS — I519 Heart disease, unspecified: Secondary | ICD-10-CM | POA: Diagnosis not present

## 2016-02-14 DIAGNOSIS — N189 Chronic kidney disease, unspecified: Secondary | ICD-10-CM | POA: Diagnosis not present

## 2016-02-14 DIAGNOSIS — I10 Essential (primary) hypertension: Secondary | ICD-10-CM | POA: Diagnosis not present

## 2016-02-14 DIAGNOSIS — I509 Heart failure, unspecified: Secondary | ICD-10-CM | POA: Diagnosis not present

## 2016-02-15 DIAGNOSIS — I10 Essential (primary) hypertension: Secondary | ICD-10-CM | POA: Diagnosis not present

## 2016-02-15 DIAGNOSIS — I2510 Atherosclerotic heart disease of native coronary artery without angina pectoris: Secondary | ICD-10-CM | POA: Diagnosis not present

## 2016-02-15 DIAGNOSIS — N189 Chronic kidney disease, unspecified: Secondary | ICD-10-CM | POA: Diagnosis not present

## 2016-02-15 DIAGNOSIS — R001 Bradycardia, unspecified: Secondary | ICD-10-CM | POA: Diagnosis not present

## 2016-02-15 DIAGNOSIS — I509 Heart failure, unspecified: Secondary | ICD-10-CM | POA: Diagnosis not present

## 2016-02-15 DIAGNOSIS — I519 Heart disease, unspecified: Secondary | ICD-10-CM | POA: Diagnosis not present

## 2016-02-15 NOTE — Assessment & Plan Note (Signed)
Will try myrbetriq for the UI as related to medication and age, no features of infection.

## 2016-02-15 NOTE — Assessment & Plan Note (Signed)
It is unclear why she was on acetazolamide and will stop as this could be worsening her urinary problems and decrease her lasix to 40 mg daily from 80 mg daily. She does have fairly normal renal function for age.

## 2016-02-15 NOTE — Assessment & Plan Note (Signed)
Not having symptoms so stop PPI and if symptoms recur then can restart.

## 2016-02-15 NOTE — Progress Notes (Signed)
   Subjective:    Patient ID: Jessica [name], female    DOB: 1911-11-19, 82 y.o.   MRN: [id number]  HPI The patient is a 82 YO female coming in for excessive urination and incontinence. She is taking several medications that she has been on for many years. No recent changes. Denies confusion or pain with urination. No stomach pain. This is a problem that has been present for some time but worsening in the last 6 months. No fevers or chills.   Review of Systems  Constitutional: Negative for fever, activity change, appetite change, fatigue and unexpected weight change.  Respiratory: Negative.   Cardiovascular: Negative.   Gastrointestinal: Negative.   Genitourinary: Positive for urgency and frequency. Negative for dysuria, flank pain and decreased urine volume.       Incontinence  Musculoskeletal: Positive for arthralgias. Negative for myalgias, back pain and joint swelling.  Skin: Negative.   Psychiatric/Behavioral:       Memory disturbance      Objective:   Physical Exam  Constitutional: She appears well-developed and well-nourished.  Slightly frail  HENT:  Head: Normocephalic and atraumatic.  Eyes: EOM are normal.  Neck: Normal range of motion.  Cardiovascular: Normal rate.   Pulmonary/Chest: Effort normal and breath sounds normal.  Abdominal: Soft. Bowel sounds are normal.  No suprapubic tenderness  Neurological: She is alert.  Not oriented to date, memory with some lapses   Skin: Skin is warm and dry.   Filed Vitals:   02/12/16 1308  BP: 120/80  Pulse: 50  Temp: 98.9 F (37.2 C)  TempSrc: Oral  Resp: 12  Height: 5\' 3"  (1.6 m)  Weight: 106 lb 3.2 oz (48.172 kg)  SpO2: 98%      Assessment & Plan:

## 2016-02-20 DIAGNOSIS — I509 Heart failure, unspecified: Secondary | ICD-10-CM | POA: Diagnosis not present

## 2016-02-20 DIAGNOSIS — N189 Chronic kidney disease, unspecified: Secondary | ICD-10-CM | POA: Diagnosis not present

## 2016-02-20 DIAGNOSIS — I519 Heart disease, unspecified: Secondary | ICD-10-CM | POA: Diagnosis not present

## 2016-02-20 DIAGNOSIS — I10 Essential (primary) hypertension: Secondary | ICD-10-CM | POA: Diagnosis not present

## 2016-02-20 DIAGNOSIS — I2510 Atherosclerotic heart disease of native coronary artery without angina pectoris: Secondary | ICD-10-CM | POA: Diagnosis not present

## 2016-02-20 DIAGNOSIS — R001 Bradycardia, unspecified: Secondary | ICD-10-CM | POA: Diagnosis not present

## 2016-02-22 DIAGNOSIS — I2510 Atherosclerotic heart disease of native coronary artery without angina pectoris: Secondary | ICD-10-CM | POA: Diagnosis not present

## 2016-02-22 DIAGNOSIS — R001 Bradycardia, unspecified: Secondary | ICD-10-CM | POA: Diagnosis not present

## 2016-02-22 DIAGNOSIS — N189 Chronic kidney disease, unspecified: Secondary | ICD-10-CM | POA: Diagnosis not present

## 2016-02-22 DIAGNOSIS — I519 Heart disease, unspecified: Secondary | ICD-10-CM | POA: Diagnosis not present

## 2016-02-22 DIAGNOSIS — I10 Essential (primary) hypertension: Secondary | ICD-10-CM | POA: Diagnosis not present

## 2016-02-22 DIAGNOSIS — I509 Heart failure, unspecified: Secondary | ICD-10-CM | POA: Diagnosis not present

## 2016-02-24 DIAGNOSIS — N189 Chronic kidney disease, unspecified: Secondary | ICD-10-CM | POA: Diagnosis not present

## 2016-02-24 DIAGNOSIS — I10 Essential (primary) hypertension: Secondary | ICD-10-CM | POA: Diagnosis not present

## 2016-02-24 DIAGNOSIS — I509 Heart failure, unspecified: Secondary | ICD-10-CM | POA: Diagnosis not present

## 2016-02-24 DIAGNOSIS — I2510 Atherosclerotic heart disease of native coronary artery without angina pectoris: Secondary | ICD-10-CM | POA: Diagnosis not present

## 2016-02-24 DIAGNOSIS — I519 Heart disease, unspecified: Secondary | ICD-10-CM | POA: Diagnosis not present

## 2016-02-24 DIAGNOSIS — R001 Bradycardia, unspecified: Secondary | ICD-10-CM | POA: Diagnosis not present

## 2016-02-29 DIAGNOSIS — R001 Bradycardia, unspecified: Secondary | ICD-10-CM | POA: Diagnosis not present

## 2016-02-29 DIAGNOSIS — I2510 Atherosclerotic heart disease of native coronary artery without angina pectoris: Secondary | ICD-10-CM | POA: Diagnosis not present

## 2016-02-29 DIAGNOSIS — N189 Chronic kidney disease, unspecified: Secondary | ICD-10-CM | POA: Diagnosis not present

## 2016-02-29 DIAGNOSIS — I10 Essential (primary) hypertension: Secondary | ICD-10-CM | POA: Diagnosis not present

## 2016-02-29 DIAGNOSIS — I519 Heart disease, unspecified: Secondary | ICD-10-CM | POA: Diagnosis not present

## 2016-02-29 DIAGNOSIS — I509 Heart failure, unspecified: Secondary | ICD-10-CM | POA: Diagnosis not present

## 2016-03-03 DIAGNOSIS — I2510 Atherosclerotic heart disease of native coronary artery without angina pectoris: Secondary | ICD-10-CM | POA: Diagnosis not present

## 2016-03-03 DIAGNOSIS — R001 Bradycardia, unspecified: Secondary | ICD-10-CM | POA: Diagnosis not present

## 2016-03-03 DIAGNOSIS — N189 Chronic kidney disease, unspecified: Secondary | ICD-10-CM | POA: Diagnosis not present

## 2016-03-03 DIAGNOSIS — I10 Essential (primary) hypertension: Secondary | ICD-10-CM | POA: Diagnosis not present

## 2016-03-03 DIAGNOSIS — I509 Heart failure, unspecified: Secondary | ICD-10-CM | POA: Diagnosis not present

## 2016-03-03 DIAGNOSIS — I519 Heart disease, unspecified: Secondary | ICD-10-CM | POA: Diagnosis not present

## 2016-03-07 ENCOUNTER — Other Ambulatory Visit: Payer: Self-pay | Admitting: Pulmonary Disease

## 2016-03-07 DIAGNOSIS — I509 Heart failure, unspecified: Secondary | ICD-10-CM | POA: Diagnosis not present

## 2016-03-07 DIAGNOSIS — R001 Bradycardia, unspecified: Secondary | ICD-10-CM | POA: Diagnosis not present

## 2016-03-07 DIAGNOSIS — I10 Essential (primary) hypertension: Secondary | ICD-10-CM | POA: Diagnosis not present

## 2016-03-07 DIAGNOSIS — I519 Heart disease, unspecified: Secondary | ICD-10-CM | POA: Diagnosis not present

## 2016-03-07 DIAGNOSIS — N189 Chronic kidney disease, unspecified: Secondary | ICD-10-CM | POA: Diagnosis not present

## 2016-03-07 DIAGNOSIS — I2510 Atherosclerotic heart disease of native coronary artery without angina pectoris: Secondary | ICD-10-CM | POA: Diagnosis not present

## 2016-03-08 DIAGNOSIS — I2510 Atherosclerotic heart disease of native coronary artery without angina pectoris: Secondary | ICD-10-CM | POA: Diagnosis not present

## 2016-03-08 DIAGNOSIS — I519 Heart disease, unspecified: Secondary | ICD-10-CM | POA: Diagnosis not present

## 2016-03-08 DIAGNOSIS — I10 Essential (primary) hypertension: Secondary | ICD-10-CM | POA: Diagnosis not present

## 2016-03-08 DIAGNOSIS — R001 Bradycardia, unspecified: Secondary | ICD-10-CM | POA: Diagnosis not present

## 2016-03-08 DIAGNOSIS — N189 Chronic kidney disease, unspecified: Secondary | ICD-10-CM | POA: Diagnosis not present

## 2016-03-08 DIAGNOSIS — I509 Heart failure, unspecified: Secondary | ICD-10-CM | POA: Diagnosis not present

## 2016-03-09 DIAGNOSIS — I519 Heart disease, unspecified: Secondary | ICD-10-CM | POA: Diagnosis not present

## 2016-03-09 DIAGNOSIS — I509 Heart failure, unspecified: Secondary | ICD-10-CM | POA: Diagnosis not present

## 2016-03-09 DIAGNOSIS — N189 Chronic kidney disease, unspecified: Secondary | ICD-10-CM | POA: Diagnosis not present

## 2016-03-09 DIAGNOSIS — R001 Bradycardia, unspecified: Secondary | ICD-10-CM | POA: Diagnosis not present

## 2016-03-09 DIAGNOSIS — I10 Essential (primary) hypertension: Secondary | ICD-10-CM | POA: Diagnosis not present

## 2016-03-09 DIAGNOSIS — I2510 Atherosclerotic heart disease of native coronary artery without angina pectoris: Secondary | ICD-10-CM | POA: Diagnosis not present

## 2016-03-11 DIAGNOSIS — I10 Essential (primary) hypertension: Secondary | ICD-10-CM | POA: Diagnosis not present

## 2016-03-11 DIAGNOSIS — I509 Heart failure, unspecified: Secondary | ICD-10-CM | POA: Diagnosis not present

## 2016-03-11 DIAGNOSIS — N189 Chronic kidney disease, unspecified: Secondary | ICD-10-CM | POA: Diagnosis not present

## 2016-03-11 DIAGNOSIS — I519 Heart disease, unspecified: Secondary | ICD-10-CM | POA: Diagnosis not present

## 2016-03-11 DIAGNOSIS — I2510 Atherosclerotic heart disease of native coronary artery without angina pectoris: Secondary | ICD-10-CM | POA: Diagnosis not present

## 2016-03-11 DIAGNOSIS — R001 Bradycardia, unspecified: Secondary | ICD-10-CM | POA: Diagnosis not present

## 2016-03-13 DIAGNOSIS — I10 Essential (primary) hypertension: Secondary | ICD-10-CM | POA: Diagnosis not present

## 2016-03-13 DIAGNOSIS — I2510 Atherosclerotic heart disease of native coronary artery without angina pectoris: Secondary | ICD-10-CM | POA: Diagnosis not present

## 2016-03-13 DIAGNOSIS — I509 Heart failure, unspecified: Secondary | ICD-10-CM | POA: Diagnosis not present

## 2016-03-13 DIAGNOSIS — R001 Bradycardia, unspecified: Secondary | ICD-10-CM | POA: Diagnosis not present

## 2016-03-13 DIAGNOSIS — I519 Heart disease, unspecified: Secondary | ICD-10-CM | POA: Diagnosis not present

## 2016-03-13 DIAGNOSIS — N189 Chronic kidney disease, unspecified: Secondary | ICD-10-CM | POA: Diagnosis not present

## 2016-03-14 DIAGNOSIS — N189 Chronic kidney disease, unspecified: Secondary | ICD-10-CM | POA: Diagnosis not present

## 2016-03-14 DIAGNOSIS — I10 Essential (primary) hypertension: Secondary | ICD-10-CM | POA: Diagnosis not present

## 2016-03-14 DIAGNOSIS — R001 Bradycardia, unspecified: Secondary | ICD-10-CM | POA: Diagnosis not present

## 2016-03-14 DIAGNOSIS — I2510 Atherosclerotic heart disease of native coronary artery without angina pectoris: Secondary | ICD-10-CM | POA: Diagnosis not present

## 2016-03-14 DIAGNOSIS — I509 Heart failure, unspecified: Secondary | ICD-10-CM | POA: Diagnosis not present

## 2016-03-14 DIAGNOSIS — I519 Heart disease, unspecified: Secondary | ICD-10-CM | POA: Diagnosis not present

## 2016-03-15 ENCOUNTER — Telehealth: Payer: Self-pay | Admitting: *Deleted

## 2016-03-15 DIAGNOSIS — G459 Transient cerebral ischemic attack, unspecified: Secondary | ICD-10-CM

## 2016-03-15 DIAGNOSIS — I509 Heart failure, unspecified: Secondary | ICD-10-CM

## 2016-03-15 DIAGNOSIS — I2510 Atherosclerotic heart disease of native coronary artery without angina pectoris: Secondary | ICD-10-CM

## 2016-03-15 NOTE — Telephone Encounter (Signed)
MD out of office today & next week pls advise on msg below...Raechel Chute

## 2016-03-15 NOTE — Telephone Encounter (Signed)
Ok Rx for a w/c Thx

## 2016-03-15 NOTE — Telephone Encounter (Signed)
Left msg on triage stating they will be d/c pt from hospice, and the wheelchair that she has belongs to hospice. Requesting rx to be fax to [facility] for wheelchair...Raechel Chute

## 2016-03-18 ENCOUNTER — Other Ambulatory Visit: Payer: Self-pay | Admitting: Cardiovascular Disease

## 2016-03-18 ENCOUNTER — Other Ambulatory Visit: Payer: Self-pay | Admitting: *Deleted

## 2016-03-18 NOTE — Telephone Encounter (Signed)
Rx for wheelchair fax to [facility]...Raechel Chute

## 2016-03-18 NOTE — Telephone Encounter (Signed)
Rx refill sent to pharmacy. 

## 2016-03-22 ENCOUNTER — Ambulatory Visit: Payer: Medicare PPO | Admitting: Podiatry

## 2016-04-14 ENCOUNTER — Other Ambulatory Visit: Payer: Self-pay | Admitting: Pulmonary Disease

## 2016-04-27 ENCOUNTER — Other Ambulatory Visit: Payer: Self-pay | Admitting: Pulmonary Disease

## 2016-05-03 ENCOUNTER — Other Ambulatory Visit: Payer: Self-pay | Admitting: Internal Medicine

## 2016-05-21 ENCOUNTER — Encounter: Payer: Self-pay | Admitting: Podiatry

## 2016-05-21 ENCOUNTER — Ambulatory Visit (INDEPENDENT_AMBULATORY_CARE_PROVIDER_SITE_OTHER): Payer: Medicare PPO | Admitting: Podiatry

## 2016-05-21 DIAGNOSIS — M79676 Pain in unspecified toe(s): Secondary | ICD-10-CM | POA: Diagnosis not present

## 2016-05-21 DIAGNOSIS — B351 Tinea unguium: Secondary | ICD-10-CM

## 2016-05-21 DIAGNOSIS — M79673 Pain in unspecified foot: Secondary | ICD-10-CM

## 2016-05-21 NOTE — Progress Notes (Signed)
Patient ID: Lurlean Nanny, female   DOB: 10/19/1911, 82 y.o.   MRN: [id number] Complaint:  Visit Type: Patient returns to my office for continued preventative foot care services. Complaint: Patient states" my nails have grown long and thick and become painful to walk and wear shoes.. The patient presents for preventative foot care services. No changes to ROS  Podiatric Exam: Vascular: dorsalis pedis and posterior tibial pulses are not  palpable bilateral due to swelling. Capillary return is immediate.  Sensorium: Normal Semmes Weinstein monofilament test. Normal tactile sensation bilaterally. Nail Exam: Pt has thick disfigured discolored nails with subungual debris noted bilateral entire nail hallux through fifth toenails Ulcer Exam: There is no evidence of ulcer or pre-ulcerative changes or infection. Orthopedic Exam: Muscle tone and strength are WNL. No limitations in general ROM. No crepitus or effusions noted. Foot type and digits show no abnormalities. Bony prominences are unremarkable. Skin: No Porokeratosis. No infection or ulcers  Diagnosis:  Onychomycosis, , Pain in right toe, pain in left toes  Treatment & Plan Procedures and Treatment: Consent by patient was obtained for treatment procedures. The patient understood the discussion of treatment and procedures well. All questions were answered thoroughly reviewed. Debridement of mycotic and hypertrophic toenails, 1 through 5 bilateral and clearing of subungual debris. No ulceration, no infection noted.  Return Visit-Office Procedure: Patient instructed to return to the office for a follow up visit 6 months for continued evaluation and treatment.   Helane Gunther DPM

## 2016-06-01 ENCOUNTER — Other Ambulatory Visit: Payer: Self-pay | Admitting: Pulmonary Disease

## 2016-06-23 ENCOUNTER — Other Ambulatory Visit: Payer: Self-pay | Admitting: Pulmonary Disease

## 2016-06-24 ENCOUNTER — Other Ambulatory Visit: Payer: Self-pay | Admitting: Pulmonary Disease

## 2016-07-14 ENCOUNTER — Other Ambulatory Visit: Payer: Self-pay | Admitting: Pulmonary Disease

## 2016-07-29 ENCOUNTER — Other Ambulatory Visit: Payer: Self-pay | Admitting: Internal Medicine

## 2016-07-31 ENCOUNTER — Other Ambulatory Visit: Payer: Self-pay | Admitting: Pulmonary Disease

## 2016-08-04 ENCOUNTER — Other Ambulatory Visit: Payer: Self-pay | Admitting: Pulmonary Disease

## 2016-08-20 ENCOUNTER — Telehealth: Payer: Self-pay

## 2016-08-20 ENCOUNTER — Other Ambulatory Visit (INDEPENDENT_AMBULATORY_CARE_PROVIDER_SITE_OTHER): Payer: Medicare PPO

## 2016-08-20 ENCOUNTER — Ambulatory Visit (INDEPENDENT_AMBULATORY_CARE_PROVIDER_SITE_OTHER): Payer: Medicare PPO | Admitting: Internal Medicine

## 2016-08-20 ENCOUNTER — Other Ambulatory Visit: Payer: Self-pay | Admitting: Internal Medicine

## 2016-08-20 ENCOUNTER — Encounter: Payer: Self-pay | Admitting: Internal Medicine

## 2016-08-20 VITALS — BP 100/58 | HR 84 | Temp 98.8°F | Resp 12 | Ht 65.0 in | Wt 105.0 lb

## 2016-08-20 DIAGNOSIS — I5042 Chronic combined systolic (congestive) and diastolic (congestive) heart failure: Secondary | ICD-10-CM

## 2016-08-20 DIAGNOSIS — I2510 Atherosclerotic heart disease of native coronary artery without angina pectoris: Secondary | ICD-10-CM | POA: Diagnosis not present

## 2016-08-20 DIAGNOSIS — Z0000 Encounter for general adult medical examination without abnormal findings: Secondary | ICD-10-CM

## 2016-08-20 DIAGNOSIS — N183 Chronic kidney disease, stage 3 unspecified: Secondary | ICD-10-CM

## 2016-08-20 DIAGNOSIS — Z23 Encounter for immunization: Secondary | ICD-10-CM | POA: Diagnosis not present

## 2016-08-20 DIAGNOSIS — I10 Essential (primary) hypertension: Secondary | ICD-10-CM

## 2016-08-20 DIAGNOSIS — R32 Unspecified urinary incontinence: Secondary | ICD-10-CM

## 2016-08-20 LAB — COMPREHENSIVE METABOLIC PANEL
ALBUMIN: 3.9 g/dL (ref 3.5–5.2)
ALK PHOS: 53 U/L (ref 39–117)
ALT: 7 U/L (ref 0–35)
AST: 14 U/L (ref 0–37)
BUN: 22 mg/dL (ref 6–23)
CO2: 30 mEq/L (ref 19–32)
CREATININE: 1.26 mg/dL — AB (ref 0.40–1.20)
Calcium: 10 mg/dL (ref 8.4–10.5)
Chloride: 107 mEq/L (ref 96–112)
GFR: 49.93 mL/min — ABNORMAL LOW (ref >60.00)
GLUCOSE: 81 mg/dL (ref 70–99)
Potassium: 3.6 mEq/L (ref 3.5–5.1)
SODIUM: 143 meq/L (ref 135–145)
TOTAL PROTEIN: 6.8 g/dL (ref 6.0–8.3)
Total Bilirubin: 0.4 mg/dL (ref 0.2–1.2)

## 2016-08-20 LAB — CBC
HCT: 37.8 % (ref 36.0–46.0)
HEMOGLOBIN: 12.2 g/dL (ref 12.0–15.0)
MCHC: 32.4 g/dL (ref 30.0–36.0)
MCV: 86.4 fl (ref 78.0–100.0)
Platelets: 251 10*3/uL (ref 150.0–400.0)
RBC: 4.37 Mil/uL (ref 3.87–5.11)
RDW: 15.1 % (ref 11.5–15.5)
WBC: 5.5 10*3/uL (ref 4.0–10.5)

## 2016-08-20 NOTE — Assessment & Plan Note (Signed)
Will change lasix to prn only. Checking CMP.

## 2016-08-20 NOTE — Assessment & Plan Note (Signed)
Unclear why she is on dual antiplatelet. Will message her cardiologist to see if she still needs this in an effort to simplify her regimen. Changing lasix to prn only for low normal BP and stable swelling (even when she misses a dose). Stop potassium. Lasix prn for 3 pound weight gain or swelling in her legs.  

## 2016-08-20 NOTE — Patient Instructions (Addendum)
You can stop taking the furosemide (lasix). Keep the bottle at home and if the ankles are very swollen (or you gain 3 pounds in 2 days) you can take 1 pill then stop again.   Stop taking the potassium pills. You do not need them.   We are checking the labs today and will call back with the results.   Health Maintenance, Female Adopting a healthy lifestyle and getting preventive care can go a long way to promote health and wellness. Talk with your health care provider about what schedule of regular examinations is right for you. This is a good chance for you to check in with your provider about disease prevention and staying healthy. In between checkups, there are plenty of things you can do on your own. Experts have done a lot of research about which lifestyle changes and preventive measures are most likely to keep you healthy. Ask your health care provider for more information. WEIGHT AND DIET  Eat a healthy diet  Be sure to include plenty of vegetables, fruits, low-fat dairy products, and lean protein.  Do not eat a lot of foods high in solid fats, added sugars, or salt.  Get regular exercise. This is one of the most important things you can do for your health.  Most adults should exercise for at least 150 minutes each week. The exercise should increase your heart rate and make you sweat (moderate-intensity exercise).  Most adults should also do strengthening exercises at least twice a week. This is in addition to the moderate-intensity exercise.  Maintain a healthy weight  Body mass index (BMI) is a measurement that can be used to identify possible weight problems. It estimates body fat based on height and weight. Your health care provider can help determine your BMI and help you achieve or maintain a healthy weight.  For females 39 years of age and older:   A BMI below 18.5 is considered underweight.  A BMI of 18.5 to 24.9 is normal.  A BMI of 25 to 29.9 is considered  overweight.  A BMI of 30 and above is considered obese.  Watch levels of cholesterol and blood lipids  You should start having your blood tested for lipids and cholesterol at 80 years of age, then have this test every 5 years.  You may need to have your cholesterol levels checked more often if:  Your lipid or cholesterol levels are high.  You are older than 80 years of age.  You are at high risk for heart disease.  CANCER SCREENING   Lung Cancer  Lung cancer screening is recommended for adults 33-25 years old who are at high risk for lung cancer because of a history of smoking.  A yearly low-dose CT scan of the lungs is recommended for people who:  Currently smoke.  Have quit within the past 15 years.  Have at least a 30-pack-year history of smoking. A pack year is smoking an average of one pack of cigarettes a day for 1 year.  Yearly screening should continue until it has been 15 years since you quit.  Yearly screening should stop if you develop a health problem that would prevent you from having lung cancer treatment.  Breast Cancer  Practice breast self-awareness. This means understanding how your breasts normally appear and feel.  It also means doing regular breast self-exams. Let your health care provider know about any changes, no matter how small.  If you are in your 20s or 30s, you should  have a clinical breast exam (CBE) by a health care provider every 1-3 years as part of a regular health exam.  If you are 68 or older, have a CBE every year. Also consider having a breast X-ray (mammogram) every year.  If you have a family history of breast cancer, talk to your health care provider about genetic screening.  If you are at high risk for breast cancer, talk to your health care provider about having an MRI and a mammogram every year.  Breast cancer gene (BRCA) assessment is recommended for women who have family members with BRCA-related cancers. BRCA-related  cancers include:  Breast.  Ovarian.  Tubal.  Peritoneal cancers.  Results of the assessment will determine the need for genetic counseling and BRCA1 and BRCA2 testing. Cervical Cancer Your health care provider may recommend that you be screened regularly for cancer of the pelvic organs (ovaries, uterus, and vagina). This screening involves a pelvic examination, including checking for microscopic changes to the surface of your cervix (Pap test). You may be encouraged to have this screening done every 3 years, beginning at age 66.  For women ages 83-65, health care providers may recommend pelvic exams and Pap testing every 3 years, or they may recommend the Pap and pelvic exam, combined with testing for human papilloma virus (HPV), every 5 years. Some types of HPV increase your risk of cervical cancer. Testing for HPV may also be done on women of any age with unclear Pap test results.  Other health care providers may not recommend any screening for nonpregnant women who are considered low risk for pelvic cancer and who do not have symptoms. Ask your health care provider if a screening pelvic exam is right for you.  If you have had past treatment for cervical cancer or a condition that could lead to cancer, you need Pap tests and screening for cancer for at least 20 years after your treatment. If Pap tests have been discontinued, your risk factors (such as having a new sexual partner) need to be reassessed to determine if screening should resume. Some women have medical problems that increase the chance of getting cervical cancer. In these cases, your health care provider may recommend more frequent screening and Pap tests. Colorectal Cancer  This type of cancer can be detected and often prevented.  Routine colorectal cancer screening usually begins at 80 years of age and continues through 80 years of age.  Your health care provider may recommend screening at an earlier age if you have risk  factors for colon cancer.  Your health care provider may also recommend using home test kits to check for hidden blood in the stool.  A small camera at the end of a tube can be used to examine your colon directly (sigmoidoscopy or colonoscopy). This is done to check for the earliest forms of colorectal cancer.  Routine screening usually begins at age 21.  Direct examination of the colon should be repeated every 5-10 years through 80 years of age. However, you may need to be screened more often if early forms of precancerous polyps or small growths are found. Skin Cancer  Check your skin from head to toe regularly.  Tell your health care provider about any new moles or changes in moles, especially if there is a change in a mole's shape or color.  Also tell your health care provider if you have a mole that is larger than the size of a pencil eraser.  Always use sunscreen. Apply  sunscreen liberally and repeatedly throughout the day.  Protect yourself by wearing long sleeves, pants, a wide-brimmed hat, and sunglasses whenever you are outside. HEART DISEASE, DIABETES, AND HIGH BLOOD PRESSURE   High blood pressure causes heart disease and increases the risk of stroke. High blood pressure is more likely to develop in:  People who have blood pressure in the high end of the normal range (130-139/85-89 mm Hg).  People who are overweight or obese.  People who are African American.  If you are 87-106 years of age, have your blood pressure checked every 3-5 years. If you are 77 years of age or older, have your blood pressure checked every year. You should have your blood pressure measured twice--once when you are at a hospital or clinic, and once when you are not at a hospital or clinic. Record the average of the two measurements. To check your blood pressure when you are not at a hospital or clinic, you can use:  An automated blood pressure machine at a pharmacy.  A home blood pressure  monitor.  If you are between 20 years and 76 years old, ask your health care provider if you should take aspirin to prevent strokes.  Have regular diabetes screenings. This involves taking a blood sample to check your fasting blood sugar level.  If you are at a normal weight and have a low risk for diabetes, have this test once every three years after 80 years of age.  If you are overweight and have a high risk for diabetes, consider being tested at a younger age or more often. PREVENTING INFECTION  Hepatitis B  If you have a higher risk for hepatitis B, you should be screened for this virus. You are considered at high risk for hepatitis B if:  You were born in a country where hepatitis B is common. Ask your health care provider which countries are considered high risk.  Your parents were born in a high-risk country, and you have not been immunized against hepatitis B (hepatitis B vaccine).  You have HIV or AIDS.  You use needles to inject street drugs.  You live with someone who has hepatitis B.  You have had sex with someone who has hepatitis B.  You get hemodialysis treatment.  You take certain medicines for conditions, including cancer, organ transplantation, and autoimmune conditions. Hepatitis C  Blood testing is recommended for:  Everyone born from [birth year] through 1963.  Anyone with known risk factors for hepatitis C. Sexually transmitted infections (STIs)  You should be screened for sexually transmitted infections (STIs) including gonorrhea and chlamydia if:  You are sexually active and are younger than 80 years of age.  You are older than 80 years of age and your health care provider tells you that you are at risk for this type of infection.  Your sexual activity has changed since you were last screened and you are at an increased risk for chlamydia or gonorrhea. Ask your health care provider if you are at risk.  If you do not have HIV, but are at risk, it may be  recommended that you take a prescription medicine daily to prevent HIV infection. This is called pre-exposure prophylaxis (PrEP). You are considered at risk if:  You are sexually active and do not regularly use condoms or know the HIV status of your partner(s).  You take drugs by injection.  You are sexually active with a partner who has HIV. Talk with your health care provider about whether  you are at high risk of being infected with HIV. If you choose to begin PrEP, you should first be tested for HIV. You should then be tested every 3 months for as long as you are taking PrEP.  PREGNANCY   If you are premenopausal and you may become pregnant, ask your health care provider about preconception counseling.  If you may become pregnant, take 400 to 800 micrograms (mcg) of folic acid every day.  If you want to prevent pregnancy, talk to your health care provider about birth control (contraception). OSTEOPOROSIS AND MENOPAUSE   Osteoporosis is a disease in which the bones lose minerals and strength with aging. This can result in serious bone fractures. Your risk for osteoporosis can be identified using a bone density scan.  If you are 25 years of age or older, or if you are at risk for osteoporosis and fractures, ask your health care provider if you should be screened.  Ask your health care provider whether you should take a calcium or vitamin D supplement to lower your risk for osteoporosis.  Menopause may have certain physical symptoms and risks.  Hormone replacement therapy may reduce some of these symptoms and risks. Talk to your health care provider about whether hormone replacement therapy is right for you.  HOME CARE INSTRUCTIONS   Schedule regular health, dental, and eye exams.  Stay current with your immunizations.   Do not use any tobacco products including cigarettes, chewing tobacco, or electronic cigarettes.  If you are pregnant, do not drink alcohol.  If you are  breastfeeding, limit how much and how often you drink alcohol.  Limit alcohol intake to no more than 1 drink per day for nonpregnant women. One drink equals 12 ounces of beer, 5 ounces of wine, or 1 ounces of hard liquor.  Do not use street drugs.  Do not share needles.  Ask your health care provider for help if you need support or information about quitting drugs.  Tell your health care provider if you often feel depressed.  Tell your health care provider if you have ever been abused or do not feel safe at home.   This information is not intended to replace advice given to you by your health care provider. Make sure you discuss any questions you have with your health care provider.   Document Released: 06/17/2011 Document Revised: 12/23/2014 Document Reviewed: 11/03/2013 Elsevier Interactive Patient Education [employer].

## 2016-08-20 NOTE — Telephone Encounter (Signed)
Patient son called back about labs. I informed him of the labs. He understood and would giver her all the information. Thank you.

## 2016-08-20 NOTE — Progress Notes (Signed)
   Subjective:    Patient ID: Jessica [name], female    DOB: 1911/02/12, 82 y.o.   MRN: [id number]  HPI Here for medicare wellness and CPE, no new complaints. Please see A/P for status and treatment of chronic medical problems.   Diet: average Physical activity: sedentary Depression/mood screen: negative Hearing: moderate loss bilaterally Visual acuity: grossly normal with lens, performs eye exam every several years ADLs: capable with assistance Fall risk: low Home safety: good Cognitive evaluation: intact to orientation, naming, recall and repetition EOL planning: adv directives discussed, in place  I have personally reviewed and have noted 1. The patient's medical and social history - reviewed today no changes 2. Their use of alcohol, tobacco or illicit drugs 3. Their current medications and supplements 4. The patient's functional ability including ADL's, fall risks, home safety risks and hearing or visual impairment. 5. Diet and physical activities 6. Evidence for depression or mood disorders 7. Care team reviewed and updated (available in snapshot)  Review of Systems  Constitutional: Negative for activity change, appetite change, fatigue, fever and unexpected weight change.  HENT: Negative.   Eyes: Negative.   Respiratory: Negative.   Cardiovascular: Negative.   Gastrointestinal: Negative.   Genitourinary: Positive for frequency and urgency. Negative for decreased urine volume, dysuria and flank pain.       Incontinence  Musculoskeletal: Positive for arthralgias. Negative for back pain, joint swelling and myalgias.  Skin: Negative.   Neurological: Negative.   Psychiatric/Behavioral:       Memory disturbance      Objective:   Physical Exam  Constitutional: She appears well-developed and well-nourished.  Slightly frail  HENT:  Head: Normocephalic and atraumatic.  Eyes: EOM are normal.  Neck: Normal range of motion. No JVD present.  Cardiovascular: Normal rate.     Pulmonary/Chest: Effort normal and breath sounds normal. No respiratory distress.  Abdominal: Soft. Bowel sounds are normal. She exhibits no distension. There is no tenderness. There is no rebound.  Musculoskeletal:  Some edema at the ankles, no edema in the legs. Wearing compression hose.   Neurological: She is alert. Coordination abnormal.  In wheelchair and able to transfer only  Skin: Skin is warm and dry.   Vitals:   08/20/16 1104  BP: (!) 100/58  Pulse: 84  Resp: 12  Temp: 98.8 F (37.1 C)  TempSrc: Oral  SpO2: 90%  Weight: 105 lb (47.6 kg)  Height: 5\' 5"  (1.651 m)      Assessment & Plan:  High dose flu shot given at visit.

## 2016-08-20 NOTE — Assessment & Plan Note (Signed)
Has aged out of mammogram and colonoscopy. She got flu shot today and other immunizations are up to date. Checking labs today. Counseled about fall safety and prevention for the home. Given 10 year screening recommendations. Recently discharged from hospice and she misses having them come to the house.

## 2016-08-20 NOTE — Assessment & Plan Note (Signed)
Checking CMP and adjust as needed. BP stable.

## 2016-08-20 NOTE — Assessment & Plan Note (Signed)
She did not do well with myrbetriq and cost was high. Will stop her daily lasix in an effort to help with her urinary incontinence.

## 2016-08-20 NOTE — Progress Notes (Signed)
Pre visit review using our clinic review tool, if applicable. No additional management support is needed unless otherwise documented below in the visit note. 

## 2016-08-20 NOTE — Assessment & Plan Note (Signed)
Unclear why she is on dual antiplatelet. Will message her cardiologist to see if she still needs this in an effort to simplify her regimen. Changing lasix to prn only for low normal BP and stable swelling (even when she misses a dose). Stop potassium. Lasix prn for 3 pound weight gain or swelling in her legs.

## 2016-08-26 ENCOUNTER — Other Ambulatory Visit: Payer: Self-pay | Admitting: Pulmonary Disease

## 2016-08-27 ENCOUNTER — Other Ambulatory Visit: Payer: Self-pay | Admitting: Pulmonary Disease

## 2016-09-01 ENCOUNTER — Other Ambulatory Visit: Payer: Self-pay | Admitting: Pulmonary Disease

## 2016-09-17 ENCOUNTER — Other Ambulatory Visit: Payer: Self-pay | Admitting: Pulmonary Disease

## 2016-09-21 ENCOUNTER — Other Ambulatory Visit: Payer: Self-pay | Admitting: Pulmonary Disease

## 2016-09-29 ENCOUNTER — Other Ambulatory Visit: Payer: Self-pay | Admitting: Internal Medicine

## 2016-10-16 ENCOUNTER — Other Ambulatory Visit: Payer: Self-pay | Admitting: Pulmonary Disease

## 2016-10-23 ENCOUNTER — Other Ambulatory Visit: Payer: Self-pay | Admitting: Pulmonary Disease

## 2016-10-23 ENCOUNTER — Other Ambulatory Visit: Payer: Self-pay | Admitting: *Deleted

## 2016-10-23 MED ORDER — CLOPIDOGREL BISULFATE 75 MG PO TABS: 75.0000 mg | ORAL_TABLET | Freq: Every day | ORAL | 3 refills | Status: DC

## 2016-10-23 MED ORDER — KLOR-CON M20 20 MEQ PO TBCR: 20.0000 meq | EXTENDED_RELEASE_TABLET | Freq: Two times a day (BID) | ORAL | 3 refills | Status: DC

## 2016-10-23 NOTE — Telephone Encounter (Signed)
Rec'd call from pt son Grodin) requesting refills on mom Plavix & Potassium. Verified pharmacy inform will send to [facility].../lmb

## 2016-10-31 ENCOUNTER — Telehealth: Payer: Self-pay | Admitting: *Deleted

## 2016-10-31 NOTE — Telephone Encounter (Signed)
Left msg on triage stating when mom was at her last ov MD told her to stop taking the Acetazolamide, and pharmacy called to let her know that she had a refill to pickup. Son is upset that he had to pay $66, and asking to be reimburse pharmacy inform him he would need to speak w/office. Forwarding msg to MD & Transport planner...Raechel Chute

## 2016-11-03 NOTE — Telephone Encounter (Signed)
Dr. Okey Dupre, I will be happy to help the patient with this, just wanted to confirm she was to STOP the Acetazolamide.

## 2016-11-04 NOTE — Telephone Encounter (Signed)
Yes, she should not be taking.

## 2016-11-06 ENCOUNTER — Encounter: Payer: Self-pay | Admitting: Podiatry

## 2016-11-06 ENCOUNTER — Other Ambulatory Visit: Payer: Self-pay | Admitting: Internal Medicine

## 2016-11-06 ENCOUNTER — Ambulatory Visit (INDEPENDENT_AMBULATORY_CARE_PROVIDER_SITE_OTHER): Payer: Medicare PPO | Admitting: Podiatry

## 2016-11-06 DIAGNOSIS — M7751 Other enthesopathy of right foot: Secondary | ICD-10-CM | POA: Diagnosis not present

## 2016-11-06 DIAGNOSIS — M79609 Pain in unspecified limb: Secondary | ICD-10-CM | POA: Diagnosis not present

## 2016-11-06 DIAGNOSIS — M79671 Pain in right foot: Secondary | ICD-10-CM | POA: Diagnosis not present

## 2016-11-06 DIAGNOSIS — L608 Other nail disorders: Secondary | ICD-10-CM | POA: Diagnosis not present

## 2016-11-06 DIAGNOSIS — M79676 Pain in unspecified toe(s): Secondary | ICD-10-CM | POA: Diagnosis not present

## 2016-11-06 DIAGNOSIS — L603 Nail dystrophy: Secondary | ICD-10-CM

## 2016-11-06 DIAGNOSIS — B351 Tinea unguium: Secondary | ICD-10-CM | POA: Diagnosis not present

## 2016-11-24 MED ORDER — BETAMETHASONE SOD PHOS & ACET 6 (3-3) MG/ML IJ SUSP: 3.0000 mg | Freq: Once | INTRAMUSCULAR | Status: DC

## 2016-11-24 NOTE — Progress Notes (Signed)
SUBJECTIVE Patient presents to office today complaining of elongated, thickened nails. Pain while ambulating in shoes. Patient is unable to trim their own nails.   Patient also has a new complaint today of pain and tenderness to the second digit right foot. Patient states the pain is been ongoing for months and months. Patient presents today for further treatment and evaluation  No Known Allergies  OBJECTIVE General Patient is awake, alert, and oriented x 3 and in no acute distress. Derm Skin is dry and supple bilateral. Negative open lesions or macerations. Remaining integument unremarkable. Nails are tender, long, thickened and dystrophic with subungual debris, consistent with onychomycosis, 1-5 bilateral. No signs of infection noted. Vasc  DP and PT pedal pulses palpable bilaterally. Temperature gradient within normal limits.  Neuro Epicritic and protective threshold sensation grossly intact bilaterally.  Musculoskeletal Exam pain on palpation and range of motion to the second MPJ right foot. Muscular strength within normal limits.  ASSESSMENT 1. Diabetes Mellitus w/ peripheral neuropathy 2. Onychomycosis of nail due to dermatophyte bilateral 3. Pain in foot bilateral 4. Capsulitis second MPJ right foot 5. Pain in right foot  PLAN OF CARE 1. Patient evaluated today. 2. Instructed to maintain good pedal hygiene and foot care. Stressed importance of controlling blood sugar.  3. Mechanical debridement of nails 1-5 bilaterally performed using a nail nipper. Filed with dremel without incident.  4. Injection of 0.5 mL Celestone Soluspan injected in the patient's second MPJ right foot  5. Return to clinic in 3 mos.     Felecia Shelling, DPM [facility]  Dr. Felecia Shelling, DPM   [address]                                        [place], [place] [address]                Office [phone]  Fax [phone]

## 2016-11-26 NOTE — Telephone Encounter (Signed)
No further inquiry from patient.

## 2016-11-27 ENCOUNTER — Other Ambulatory Visit: Payer: Self-pay | Admitting: Internal Medicine

## 2016-12-12 ENCOUNTER — Other Ambulatory Visit: Payer: Self-pay | Admitting: Pulmonary Disease

## 2016-12-20 ENCOUNTER — Telehealth: Payer: Self-pay | Admitting: Internal Medicine

## 2016-12-20 NOTE — Telephone Encounter (Signed)
Palliative care called to advised that the patient reports having blood in urine. She states no abd pain or other sx at all. They are having her hold taking baby aspirin over the weekend. They are asking if you have any further instructions.

## 2016-12-20 NOTE — Telephone Encounter (Signed)
Okay to hold aspirin for 1 week and if bleeding continues would recommend visit next week. If she is having any symptoms such as abdominal pain, problems breathing, passing out, feeling dizzy or lightheaded then she needs visit sooner or to the ER.

## 2016-12-20 NOTE — Telephone Encounter (Signed)
Jessica [name]

## 2016-12-20 NOTE — Telephone Encounter (Signed)
Called [name] at [facility] and she stated understanding of results for Jessica [name]

## 2016-12-25 ENCOUNTER — Other Ambulatory Visit: Payer: Self-pay | Admitting: Pulmonary Disease

## 2016-12-28 IMAGING — US US RENAL
1 series · 13 of 25 positions shown · non-contrast
Comparison: CT abdomen and pelvis July 05, 2009

CLINICAL DATA: Dysuria with progressive inability to void

EXAM:
RENAL / URINARY TRACT ULTRASOUND COMPLETE

[Series 1: us renal · 0.22mm/px · 13 of 57 slices shown]
[im 1/57]
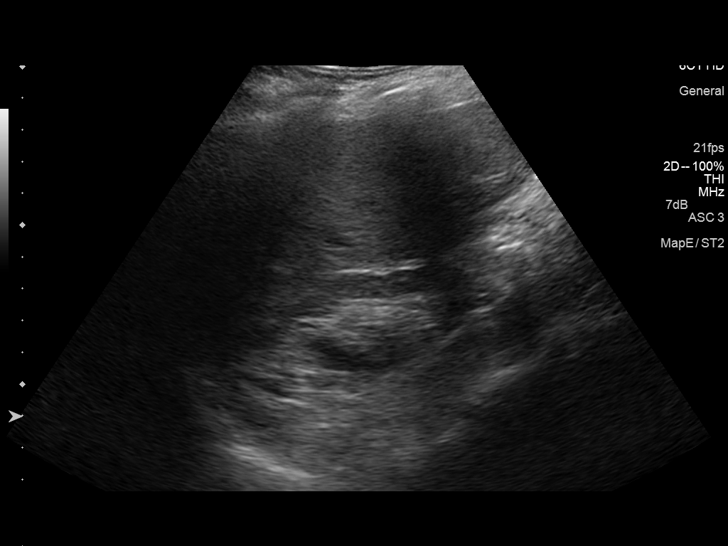
[im 5/57]
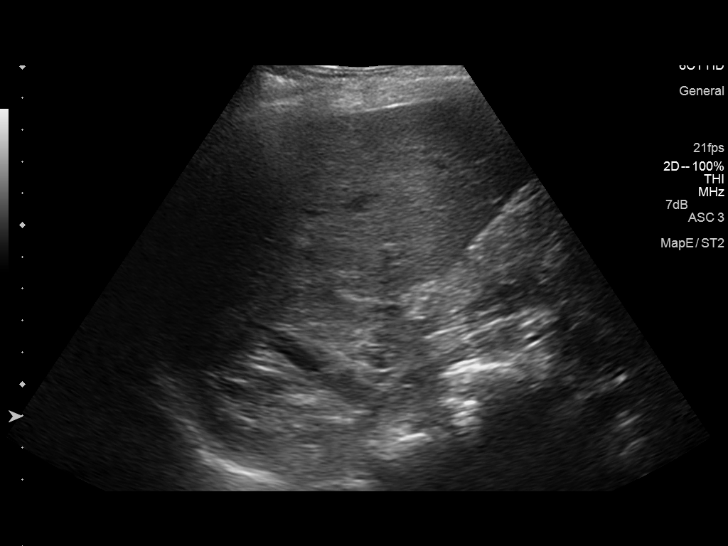
[im 10/57]
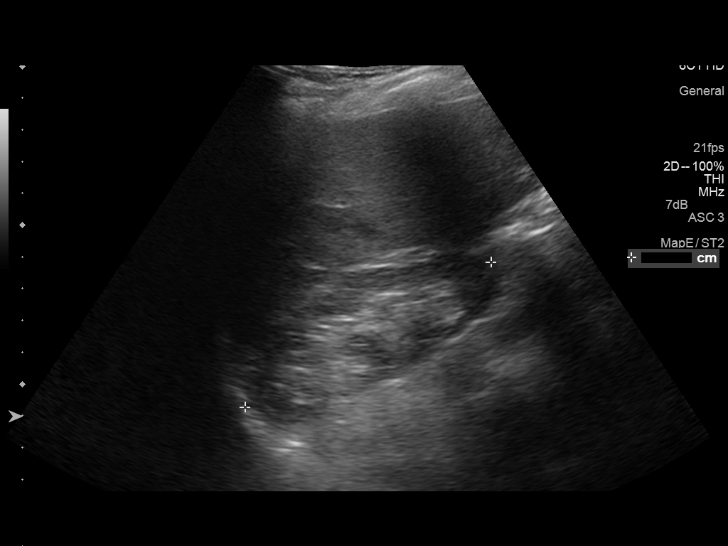
[im 15/57]
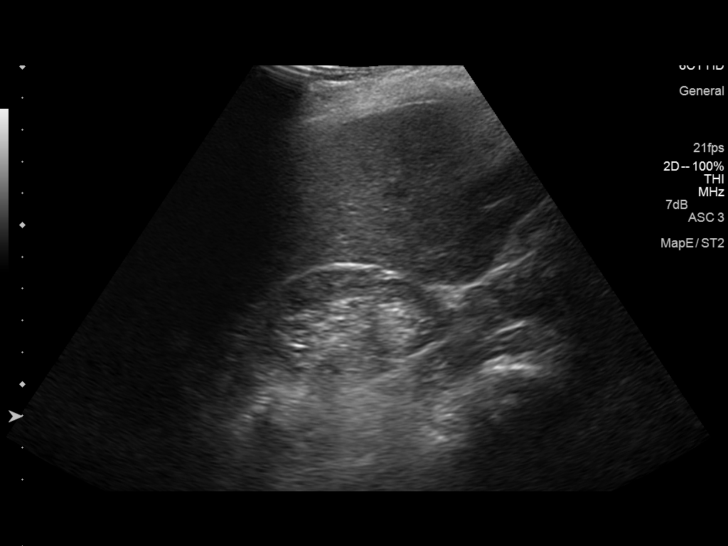
[im 19/57]
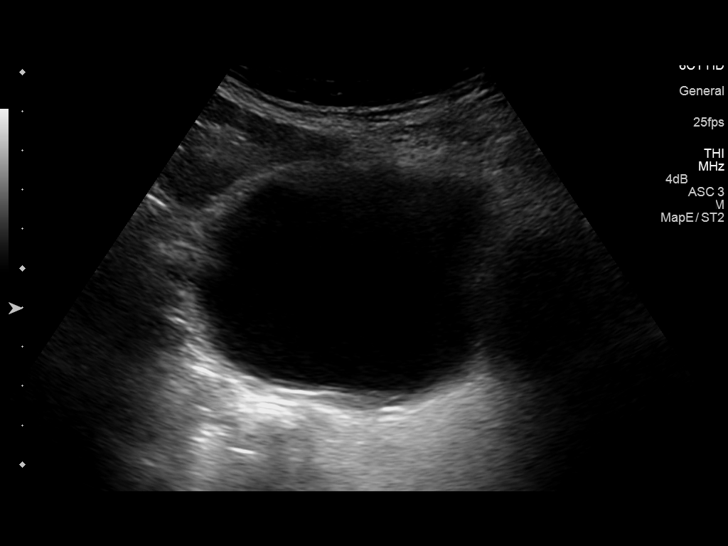
[im 24/57]
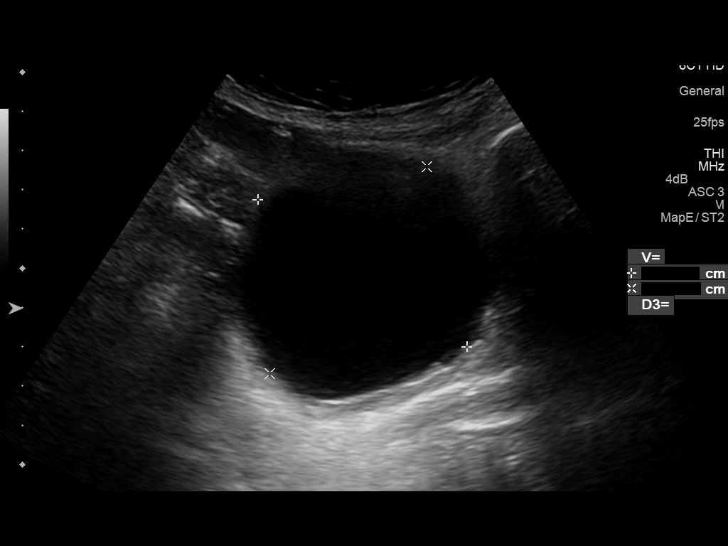
[im 29/57]
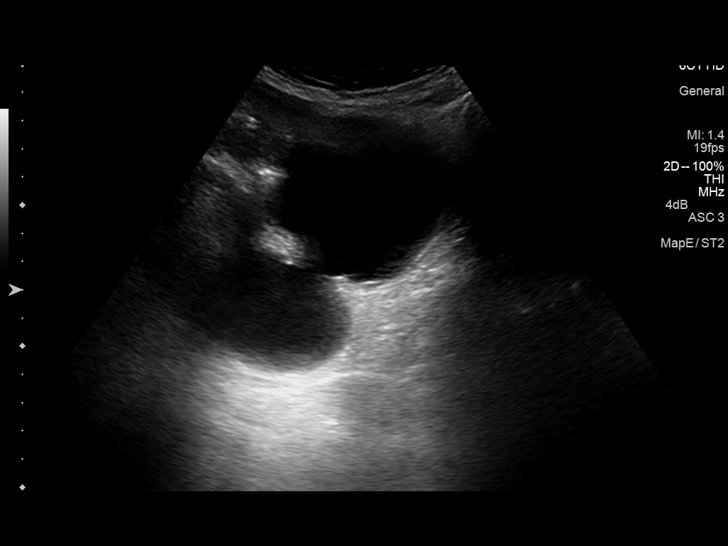
[im 33/57]
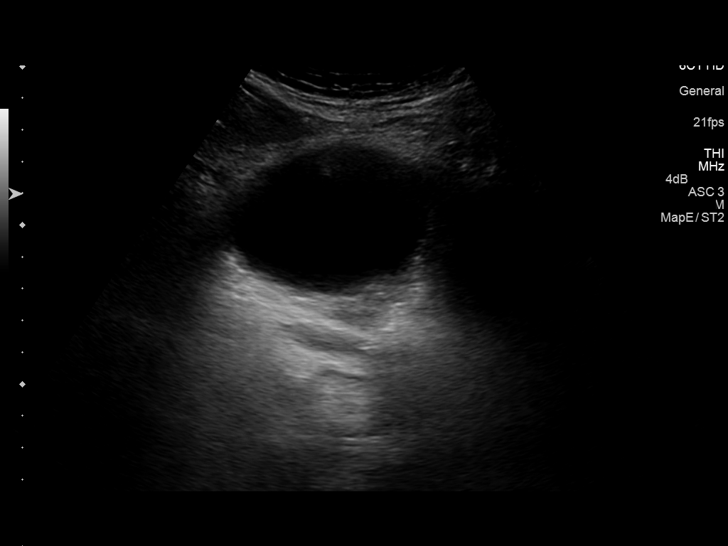
[im 38/57]
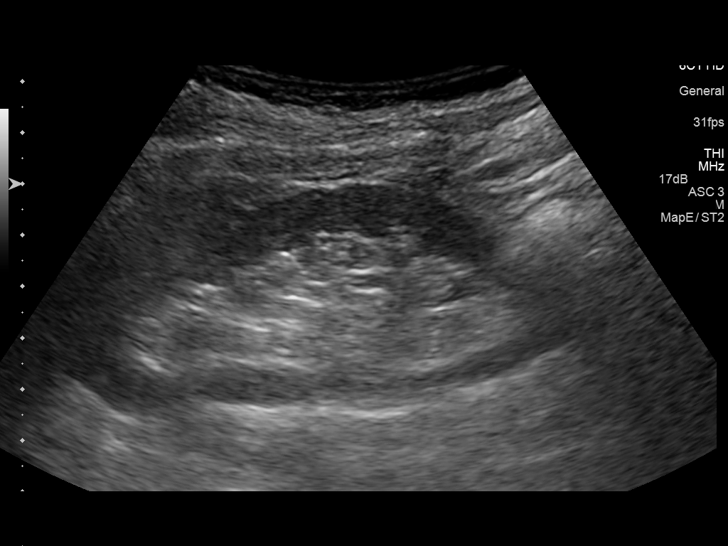
[im 43/57]
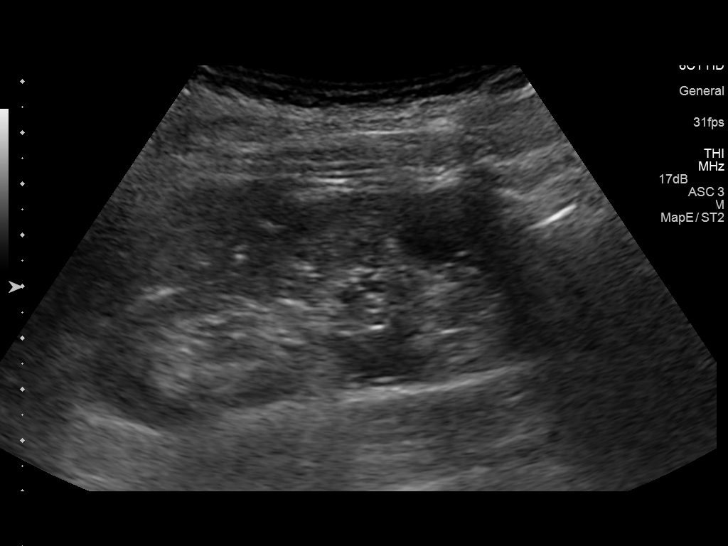
[im 47/57]
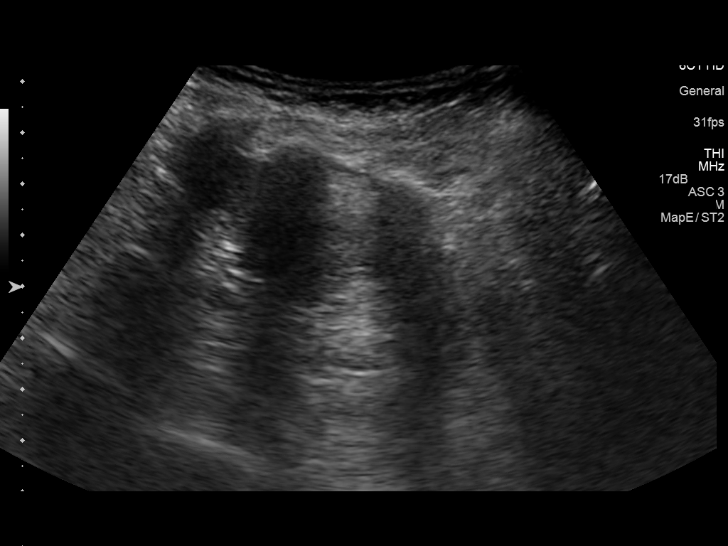
[im 52/57]
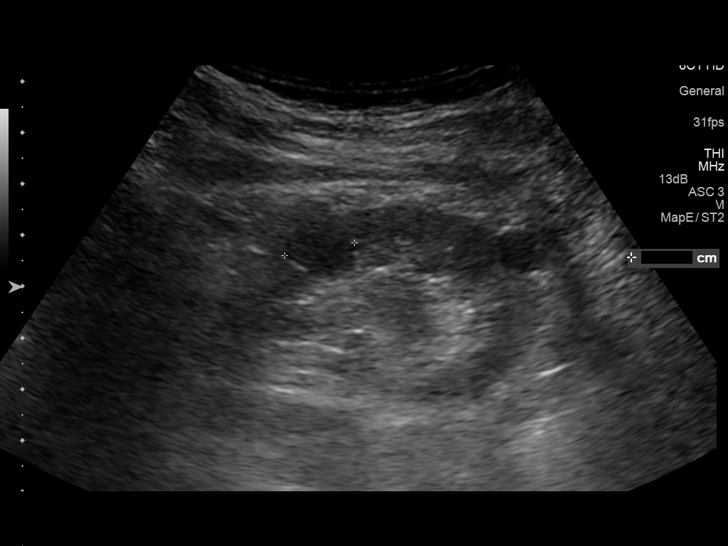
[im 57/57]
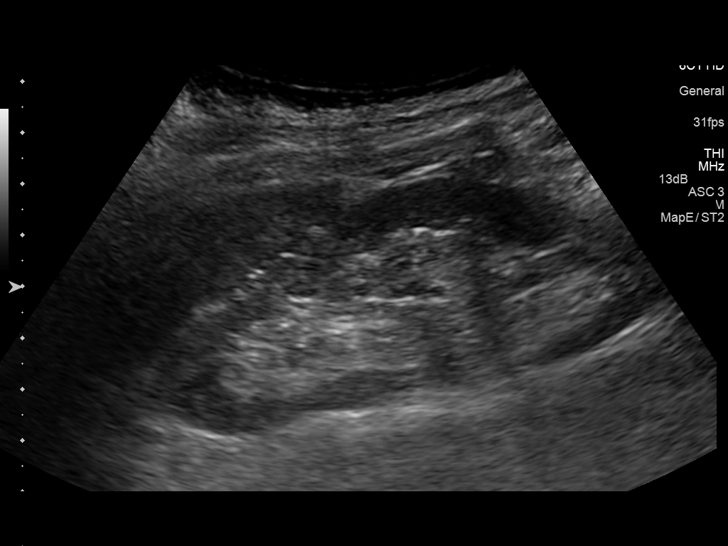

[13 of 25 positions shown; findings below may reference images not displayed]

FINDINGS: Right Kidney:

Length: 9.0 cm. Echogenicity within normal limits. There is renal
cortical thinning. No mass visualized. There is minimal fullness of
the right renal collecting system and minimal perinephric fluid. No
sonographically demonstrable calculus or ureterectasis.

Left Kidney:

Length: 10.2 cm Echogenicity within normal limits. Renal cortical
thickness is low normal. No perinephric fluid or hydronephrosis
visualized. There is a cyst in the mid to lower pole region
measuring 1.7 x 1.8 x 1.4 cm. A cyst more inferiorly on the left
measures 1.4 x 0.9 x 0.9 cm.

Bladder:

There is a diverticulum arising from the bladder posteriorly
measuring 4.0 x 3.7 cm. There is debris within this diverticulum. A
lesser amount of debris is noted in the urinary bladder itself.
IMPRESSION: Minimal fullness of the right renal collecting system. Minimal
perinephric fluid of uncertain etiology.

There is debris in the urinary bladder and somewhat greater degree
within a urinary bladder diverticulum. The urinary bladder volume is
measured at 160 mL. There is mild renal cortical thinning on the
right and low normal renal cortical thickness on the left. These
findings may be functions of the patient's age or possibly could
represent a degree of medical renal disease.

## 2017-01-27 ENCOUNTER — Other Ambulatory Visit: Payer: Self-pay | Admitting: Internal Medicine

## 2017-01-28 ENCOUNTER — Other Ambulatory Visit: Payer: Self-pay | Admitting: Internal Medicine

## 2017-01-28 ENCOUNTER — Other Ambulatory Visit: Payer: Self-pay | Admitting: Cardiovascular Disease

## 2017-02-17 ENCOUNTER — Ambulatory Visit (INDEPENDENT_AMBULATORY_CARE_PROVIDER_SITE_OTHER): Payer: Medicare Other | Admitting: Internal Medicine

## 2017-02-17 ENCOUNTER — Encounter: Payer: Self-pay | Admitting: Internal Medicine

## 2017-02-17 DIAGNOSIS — I2510 Atherosclerotic heart disease of native coronary artery without angina pectoris: Secondary | ICD-10-CM

## 2017-02-17 DIAGNOSIS — G458 Other transient cerebral ischemic attacks and related syndromes: Secondary | ICD-10-CM | POA: Diagnosis not present

## 2017-02-17 DIAGNOSIS — I10 Essential (primary) hypertension: Secondary | ICD-10-CM

## 2017-02-17 NOTE — Assessment & Plan Note (Signed)
On aspirin for prevention and does not need dual antiplatelet.

## 2017-02-17 NOTE — Progress Notes (Signed)
   Subjective:    Patient ID: Jessica [name], female    DOB: 02-20-1911, 83 y.o.   MRN: [id number]  HPI The patient is a 83 YO female coming in for follow up of her medical conditions including her hypertension (supposed to stop diamox and lasix after last visit, refills have been sent in since last visit, she and her son deny that she is taking the lasix and not sure about the diamox, she is no longer taking potassium daily either). She was also supposed to stop her plavix daily and this has also been refilled since last time and they do think they are taking this. She is still taking aspirin daily and no side effects. No blood in stool or excessive bleeding or bruising.  Review of Systems  Constitutional: Positive for activity change. Negative for appetite change, chills, fatigue, fever and unexpected weight change.  Respiratory: Negative.   Cardiovascular: Negative.   Gastrointestinal: Negative.   Musculoskeletal: Negative.   Hematological: Negative.       Objective:   Physical Exam  Constitutional: She appears well-developed.  Frail and thin  HENT:  Head: Normocephalic and atraumatic.  Eyes: EOM are normal.  Neck: Normal range of motion.  Cardiovascular: Normal rate and regular rhythm.   Pulmonary/Chest: Effort normal and breath sounds normal.  Abdominal: Soft. Bowel sounds are normal. She exhibits no distension. There is no tenderness. There is no rebound.  Musculoskeletal: She exhibits no edema.  Neurological: She is alert. Coordination abnormal.  Wheelchair for long distances, assistive device in the home  Skin: Skin is warm and dry.   Vitals:   02/17/17 1120  BP: 110/60  Pulse: (!) 56  Temp: 98.6 F (37 C)  TempSrc: Oral  SpO2: 95%  Weight: 107 lb (48.5 kg)  Height: 5\' 5"  (1.651 m)      Assessment & Plan:

## 2017-02-17 NOTE — Assessment & Plan Note (Signed)
Will again remove lasix and diamox and potassium from her list so that they are not refilled. She should be taking lasix prn only. Taking lisinopril 5 mg daily and BP at goal. Last BMP at goal and will not repeat today.

## 2017-02-17 NOTE — Patient Instructions (Addendum)
As we discussed before you do not need to take the diamox (acetazolamide). Do not take the plavix or the potassium.   Only take the lasix (furosemide) if your legs are swelling.

## 2017-02-17 NOTE — Assessment & Plan Note (Signed)
Taking ACE-I. Her cardiologist has agreed that dual antiplatelet is not needed and she will maintain aspirin 81 mg daily only. Lasix prn only as it was worsening urinary incontinence and QOL issue.

## 2017-02-17 NOTE — Progress Notes (Signed)
Pre visit review using our clinic review tool, if applicable. No additional management support is needed unless otherwise documented below in the visit note. 

## 2017-03-22 ENCOUNTER — Other Ambulatory Visit: Payer: Self-pay | Admitting: Pulmonary Disease

## 2017-03-22 ENCOUNTER — Telehealth: Payer: Self-pay | Admitting: Internal Medicine

## 2017-03-27 ENCOUNTER — Telehealth: Payer: Self-pay | Admitting: Pulmonary Disease

## 2017-03-27 ENCOUNTER — Other Ambulatory Visit: Payer: Self-pay | Admitting: Pulmonary Disease

## 2017-03-27 NOTE — Telephone Encounter (Signed)
Called and spoke with pts son and he is aware that he will have to contact Dr. Lawana Chambers office to find out about these medications being refilled.  Pt has not been seen by SN since 2016 and we are not able to send in any refills.

## 2017-03-28 NOTE — Telephone Encounter (Signed)
Patients son contacted and stated awareness 

## 2017-03-28 NOTE — Telephone Encounter (Signed)
Why is she taking this? Would recommend to try to stop and if problems call office.

## 2017-03-28 NOTE — Telephone Encounter (Signed)
Pt son called wanting refill of gabapentin. Does the pt need to continue this or stop? She has one pill left, it was prescribed by Dr. Kriste Basque but they told her to contact her primary for refills

## 2017-04-07 ENCOUNTER — Telehealth: Payer: Self-pay | Admitting: Internal Medicine

## 2017-04-14 NOTE — Telephone Encounter (Signed)
please advise on why refill wasn't appropriate so when I contact son he understands

## 2017-04-14 NOTE — Telephone Encounter (Signed)
We have repeatedly asked her to stop taking this medicine. It was removed from her list at the last visit in March and she was advised not to take lasix which is why a refill was denied.

## 2017-04-14 NOTE — Telephone Encounter (Signed)
The pts caregiver called about this medication refill. Pulmonary told her that she would have to contact Dr Okey Dupre for the refill and it was denied by Dr Okey Dupre because it was not appropriate. The caregiver would like for someone to call the pts son Tiburcio Bash at [phone] to let him know what the status of this refill is.

## 2017-04-15 NOTE — Telephone Encounter (Signed)
Patients son informed and aware

## 2017-04-16 ENCOUNTER — Ambulatory Visit: Payer: Medicare Other | Admitting: Pulmonary Disease

## 2017-04-26 ENCOUNTER — Other Ambulatory Visit: Payer: Self-pay | Admitting: Internal Medicine

## 2017-04-29 ENCOUNTER — Telehealth: Payer: Self-pay | Admitting: Internal Medicine

## 2017-04-29 ENCOUNTER — Ambulatory Visit: Payer: Medicare Other | Admitting: Internal Medicine

## 2017-04-29 NOTE — Telephone Encounter (Signed)
It would appear that she has scheduled a visit here for the afternoon. Unfortunately we cannot do home visits so she can keep that visit or wait for palliative to call back to evaluate.

## 2017-04-29 NOTE — Telephone Encounter (Signed)
Patients son contacted and stated awareness

## 2017-04-29 NOTE — Telephone Encounter (Signed)
Pts son called stating the the pt fell in her bedroom yesterday. Since she fell, she has been complaining of knee pain and is not able to walk with her walker. He has tried to contact her NP through [facility] but they have not responded to him. He is wanting someone to come out and evaluate her and did not know what else to do. Please advise.

## 2017-04-30 ENCOUNTER — Encounter: Payer: Self-pay | Admitting: Internal Medicine

## 2017-04-30 ENCOUNTER — Ambulatory Visit (INDEPENDENT_AMBULATORY_CARE_PROVIDER_SITE_OTHER): Payer: Medicare Other | Admitting: Internal Medicine

## 2017-04-30 VITALS — BP 140/70 | HR 63 | Temp 97.6°F | Resp 12 | Ht 65.0 in | Wt 108.0 lb

## 2017-04-30 DIAGNOSIS — R2681 Unsteadiness on feet: Secondary | ICD-10-CM | POA: Diagnosis not present

## 2017-04-30 DIAGNOSIS — M25561 Pain in right knee: Secondary | ICD-10-CM | POA: Diagnosis not present

## 2017-04-30 MED ORDER — DICLOFENAC SODIUM 1 % TD GEL: 2.0000 g | Freq: Three times a day (TID) | TRANSDERMAL | 2 refills | Status: DC | PRN

## 2017-04-30 NOTE — Patient Instructions (Signed)
We have sent in the gel diclofenac (voltaren) that you can use on the knee up to 3 times per day.   We will have therapy come to the house to work on strength

## 2017-05-01 DIAGNOSIS — M25561 Pain in right knee: Secondary | ICD-10-CM | POA: Insufficient documentation

## 2017-05-01 NOTE — Progress Notes (Signed)
   Subjective:    Patient ID: Jessica [name], female    DOB: [birth year], 83 y.o.   MRN: [id number]  HPI The patient is a 83 YO female coming in for fall at home. She was trying to reach something in a closet while holding on to an ironing board for support. She had to let go of this to reach the object and lost her balance. She did topple forward and hurt her right knee. She denies hitting her head or LOC. She denies any other injury to her body. This happened last Sunday night and is improving slightly. They have given her some tylenol which helped some. She denies swelling in the knee. She is extra weak and feels that this caused the fall.   Review of Systems  Constitutional: Positive for activity change. Negative for appetite change, chills, fatigue, fever and unexpected weight change.  Respiratory: Negative.   Cardiovascular: Negative.   Gastrointestinal: Negative.   Musculoskeletal: Positive for arthralgias, gait problem and myalgias. Negative for back pain, joint swelling, neck pain and neck stiffness.  Neurological: Positive for weakness.      Objective:   Physical Exam  Constitutional: She is oriented to person, place, and time. She appears well-developed and well-nourished.  HENT:  Head: Normocephalic and atraumatic.  Eyes: EOM are normal.  Cardiovascular: Normal rate and regular rhythm.   Pulmonary/Chest: Effort normal.  Abdominal: Soft.  Musculoskeletal: She exhibits tenderness. She exhibits no edema.  Right knee without effusion and ACL/PCL intact. Some pain medially without instability.   Neurological: She is alert and oriented to person, place, and time. Coordination abnormal.  Wheelchair and unable to stand without assistance.   Skin: Skin is warm and dry.   Vitals:   04/30/17 1112  BP: 140/70  Pulse: 63  Resp: 12  Temp: 97.6 F (36.4 C)  TempSrc: Oral  SpO2: 98%  Weight: 108 lb (49 kg)  Height: 5\' 5"  (1.651 m)      Assessment & Plan:

## 2017-05-01 NOTE — Assessment & Plan Note (Signed)
Referral to home health for PT/OT evaluation. She is mobile within the home but unstable and several falls recently. Supposed to be using cane in the home and uses wheelchair as she is unable to travel outside the home without assistance.

## 2017-05-01 NOTE — Assessment & Plan Note (Signed)
Rx for voltaren gel and suspect mild sprain. She does not need imaging today and no instability or tendon tear noted on exam.

## 2017-05-07 ENCOUNTER — Ambulatory Visit: Payer: Medicare PPO | Admitting: Podiatry

## 2017-05-22 ENCOUNTER — Telehealth: Payer: Self-pay | Admitting: Internal Medicine

## 2017-05-22 NOTE — Telephone Encounter (Signed)
LVM with Roe Coombs with verbal ok

## 2017-05-22 NOTE — Telephone Encounter (Signed)
Okay to give. 

## 2017-05-22 NOTE — Telephone Encounter (Signed)
Needs verbals for occpational therapy 1x3 for ADL trasnfers and exercise and ok to dishcarge when goals are met or at max potential

## 2017-05-26 DIAGNOSIS — R531 Weakness: Secondary | ICD-10-CM | POA: Diagnosis not present

## 2017-06-04 ENCOUNTER — Ambulatory Visit: Payer: Medicare Other | Admitting: Podiatry

## 2017-06-06 ENCOUNTER — Ambulatory Visit: Payer: Medicare Other | Admitting: Podiatry

## 2017-07-11 ENCOUNTER — Ambulatory Visit (INDEPENDENT_AMBULATORY_CARE_PROVIDER_SITE_OTHER): Payer: Medicare Other | Admitting: Podiatry

## 2017-07-11 ENCOUNTER — Encounter: Payer: Self-pay | Admitting: Podiatry

## 2017-07-11 DIAGNOSIS — M79676 Pain in unspecified toe(s): Secondary | ICD-10-CM | POA: Diagnosis not present

## 2017-07-11 DIAGNOSIS — B351 Tinea unguium: Secondary | ICD-10-CM | POA: Diagnosis not present

## 2017-07-11 DIAGNOSIS — G629 Polyneuropathy, unspecified: Secondary | ICD-10-CM

## 2017-07-11 NOTE — Progress Notes (Signed)
Patient ID: Lurlean Nanny, female   DOB: 1911-11-18, 83 y.o.   MRN: [id number] Complaint:  Visit Type: Patient returns to my office for continued preventative foot care services. Complaint: Patient states" my nails have grown long and thick and become painful to walk and wear shoes.. The patient presents for preventative foot care services. No changes to ROS  Podiatric Exam: Vascular: dorsalis pedis and posterior tibial pulses are not  palpable bilateral due to swelling. Capillary return is immediate.  Sensorium: Normal Semmes Weinstein monofilament test. Normal tactile sensation bilaterally. Nail Exam: Pt has thick disfigured discolored nails with subungual debris noted bilateral entire nail hallux through fifth toenails Ulcer Exam: There is no evidence of ulcer or pre-ulcerative changes or infection. Orthopedic Exam: Muscle tone and strength are WNL. No limitations in general ROM. No crepitus or effusions noted. Foot type and digits show no abnormalities. Bony prominences are unremarkable. Skin: No Porokeratosis. No infection or ulcers  Diagnosis:  Onychomycosis, , Pain in right toe, pain in left toes  Treatment & Plan Procedures and Treatment: Consent by patient was obtained for treatment procedures. The patient understood the discussion of treatment and procedures well. All questions were answered thoroughly reviewed. Debridement of mycotic and hypertrophic toenails, 1 through 5 bilateral and clearing of subungual debris. No ulceration, no infection noted.  Return Visit-Office Procedure: Patient instructed to return to the office for a follow up visit 6 months for continued evaluation and treatment.   Helane Gunther DPM

## 2017-08-28 ENCOUNTER — Encounter: Payer: Medicare Other | Admitting: Internal Medicine

## 2017-09-01 ENCOUNTER — Other Ambulatory Visit: Payer: Self-pay | Admitting: Internal Medicine

## 2017-09-11 ENCOUNTER — Ambulatory Visit (INDEPENDENT_AMBULATORY_CARE_PROVIDER_SITE_OTHER): Payer: Medicare Other | Admitting: Internal Medicine

## 2017-09-11 ENCOUNTER — Encounter: Payer: Self-pay | Admitting: Internal Medicine

## 2017-09-11 VITALS — BP 140/60 | HR 56 | Temp 97.9°F

## 2017-09-11 DIAGNOSIS — F0390 Unspecified dementia without behavioral disturbance: Secondary | ICD-10-CM

## 2017-09-11 DIAGNOSIS — N183 Chronic kidney disease, stage 3 unspecified: Secondary | ICD-10-CM

## 2017-09-11 DIAGNOSIS — Z0000 Encounter for general adult medical examination without abnormal findings: Secondary | ICD-10-CM

## 2017-09-11 DIAGNOSIS — E7800 Pure hypercholesterolemia, unspecified: Secondary | ICD-10-CM

## 2017-09-11 DIAGNOSIS — I10 Essential (primary) hypertension: Secondary | ICD-10-CM

## 2017-09-11 DIAGNOSIS — Z23 Encounter for immunization: Secondary | ICD-10-CM | POA: Diagnosis not present

## 2017-09-11 NOTE — Assessment & Plan Note (Signed)
Due to age will not check screening lipid panel, not on current treatment.

## 2017-09-11 NOTE — Assessment & Plan Note (Signed)
Continue Aricept 10 mg daily.

## 2017-09-11 NOTE — Assessment & Plan Note (Signed)
Takes lisinopril 5 mg daily, checking CMP,  Adjust as needed.

## 2017-09-11 NOTE — Patient Instructions (Signed)
Stop taking the multivitamin.

## 2017-09-11 NOTE — Assessment & Plan Note (Signed)
Checking CMP for stability, BP at goal, no diabetes.

## 2017-09-11 NOTE — Progress Notes (Signed)
   Subjective:    Patient ID: Jessica [name], female    DOB: September 18, 1911, 83 y.o.   MRN: [id number]  HPI Here for medicare wellness and physical, no new complaints. Please see A/P for status and treatment of chronic medical problems.   Diet: heart healthy Physical activity: sedentary Depression/mood screen: negative recent loss of spouse 2 weeks ago Hearing: intact to whispered voice Visual acuity: grossly normal with lenses, performs annual eye exam  ADLs: capable Fall risk: medium Home safety: good Cognitive evaluation: intact to orientation, naming, recall and repetition EOL planning: adv directives discussed, in place  I have personally reviewed and have noted 1. The patient's medical and social history - reviewed today no changes 2. Their use of alcohol, tobacco or illicit drugs 3. Their current medications and supplements 4. The patient's functional ability including ADL's, fall risks, home safety risks and hearing or visual impairment. 5. Diet and physical activities 6. Evidence for depression or mood disorders 7. Care team reviewed and updated (available in snapshot)  Review of Systems  Constitutional: Negative.   HENT: Positive for postnasal drip and rhinorrhea.   Eyes: Negative.   Respiratory: Positive for cough. Negative for chest tightness and shortness of breath.   Cardiovascular: Negative for chest pain, palpitations and leg swelling.  Gastrointestinal: Negative for abdominal distention, abdominal pain, constipation, diarrhea, nausea and vomiting.  Musculoskeletal: Negative.   Skin: Negative.   Neurological: Negative.   Psychiatric/Behavioral: Negative.       Objective:   Physical Exam  Constitutional: She is oriented to person, place, and time. She appears well-developed and well-nourished.  Frail   HENT:  Head: Normocephalic and atraumatic.  Eyes: EOM are normal.  Neck: Normal range of motion.  Cardiovascular: Normal rate and regular rhythm.     Pulmonary/Chest: Effort normal and breath sounds normal. No respiratory distress. She has no wheezes. She has no rales.  Abdominal: Soft. Bowel sounds are normal. She exhibits no distension. There is no tenderness. There is no rebound.  Musculoskeletal: She exhibits no edema.  Neurological: She is alert and oriented to person, place, and time. Coordination abnormal.  Unstable gait, slow to rise  Skin: Skin is warm and dry.  Psychiatric: She has a normal mood and affect.   Vitals:   09/11/17 1043  BP: 140/60  Pulse: (!) 56  Temp: 97.9 F (36.6 C)  TempSrc: Oral  SpO2: 99%      Assessment & Plan:  Flu shot given at visit

## 2017-09-11 NOTE — Assessment & Plan Note (Signed)
Aged out of colonoscopy, mammogram. Pneumonia, tetanus up-to-date. Flu shot given at visit. Counseled on mole surveillance, home safety and fall prevention. Given 10 year screening recommendations.

## 2017-10-05 ENCOUNTER — Other Ambulatory Visit: Payer: Self-pay | Admitting: Internal Medicine

## 2017-10-17 ENCOUNTER — Other Ambulatory Visit: Payer: Self-pay | Admitting: Cardiovascular Disease

## 2017-10-18 ENCOUNTER — Other Ambulatory Visit: Payer: Self-pay | Admitting: Internal Medicine

## 2017-10-31 ENCOUNTER — Other Ambulatory Visit: Payer: Self-pay | Admitting: Internal Medicine

## 2017-10-31 ENCOUNTER — Telehealth: Payer: Self-pay | Admitting: Cardiovascular Disease

## 2017-10-31 NOTE — Telephone Encounter (Signed)
New message     *STAT* If patient is at the pharmacy, call can be transferred to refill team.   1. Which medications need to be refilled? (please list name of each medication and dose if known) lisinopril (PRINIVIL,ZESTRIL) 5 MG tablet  2. Which pharmacy/location (including street and city if local pharmacy) is medication to be sent to? [facility] [address]  3. Do they need a 30 day or 90 day supply? 30

## 2017-11-04 ENCOUNTER — Telehealth: Payer: Self-pay | Admitting: Internal Medicine

## 2017-11-04 DIAGNOSIS — G301 Alzheimer's disease with late onset: Principal | ICD-10-CM

## 2017-11-04 DIAGNOSIS — F0280 Dementia in other diseases classified elsewhere without behavioral disturbance: Secondary | ICD-10-CM

## 2017-11-04 NOTE — Telephone Encounter (Signed)
Amber from [facility] and [facility] called stating that the pt's caregiver reached out to them saying she has had a decline in her health and would like for her to be evaluated by hospice with Dr Okey Dupre as the attending if possible. Please advise.

## 2017-11-05 NOTE — Telephone Encounter (Signed)
Called and gave verbals to Amy at hospice

## 2017-11-05 NOTE — Telephone Encounter (Signed)
Placed order, okay with being attending. Please call hospice

## 2017-11-05 NOTE — Telephone Encounter (Signed)
Amber is calling to follow up on this.   [phone]

## 2017-11-10 DIAGNOSIS — I2510 Atherosclerotic heart disease of native coronary artery without angina pectoris: Secondary | ICD-10-CM | POA: Diagnosis not present

## 2017-11-10 DIAGNOSIS — I10 Essential (primary) hypertension: Secondary | ICD-10-CM | POA: Diagnosis not present

## 2017-11-10 DIAGNOSIS — N189 Chronic kidney disease, unspecified: Secondary | ICD-10-CM | POA: Diagnosis not present

## 2017-11-10 DIAGNOSIS — I509 Heart failure, unspecified: Secondary | ICD-10-CM | POA: Diagnosis not present

## 2017-11-10 DIAGNOSIS — I519 Heart disease, unspecified: Secondary | ICD-10-CM | POA: Diagnosis not present

## 2017-11-10 DIAGNOSIS — G309 Alzheimer's disease, unspecified: Secondary | ICD-10-CM | POA: Diagnosis not present

## 2017-11-10 DIAGNOSIS — R001 Bradycardia, unspecified: Secondary | ICD-10-CM | POA: Diagnosis not present

## 2017-11-11 DIAGNOSIS — I519 Heart disease, unspecified: Secondary | ICD-10-CM | POA: Diagnosis not present

## 2017-11-11 DIAGNOSIS — R001 Bradycardia, unspecified: Secondary | ICD-10-CM | POA: Diagnosis not present

## 2017-11-11 DIAGNOSIS — I10 Essential (primary) hypertension: Secondary | ICD-10-CM | POA: Diagnosis not present

## 2017-11-11 DIAGNOSIS — N189 Chronic kidney disease, unspecified: Secondary | ICD-10-CM | POA: Diagnosis not present

## 2017-11-11 DIAGNOSIS — I2510 Atherosclerotic heart disease of native coronary artery without angina pectoris: Secondary | ICD-10-CM | POA: Diagnosis not present

## 2017-11-11 DIAGNOSIS — I509 Heart failure, unspecified: Secondary | ICD-10-CM | POA: Diagnosis not present

## 2017-11-12 DIAGNOSIS — I509 Heart failure, unspecified: Secondary | ICD-10-CM | POA: Diagnosis not present

## 2017-11-12 DIAGNOSIS — I2510 Atherosclerotic heart disease of native coronary artery without angina pectoris: Secondary | ICD-10-CM | POA: Diagnosis not present

## 2017-11-12 DIAGNOSIS — N189 Chronic kidney disease, unspecified: Secondary | ICD-10-CM | POA: Diagnosis not present

## 2017-11-12 DIAGNOSIS — I10 Essential (primary) hypertension: Secondary | ICD-10-CM | POA: Diagnosis not present

## 2017-11-12 DIAGNOSIS — R001 Bradycardia, unspecified: Secondary | ICD-10-CM | POA: Diagnosis not present

## 2017-11-12 DIAGNOSIS — I519 Heart disease, unspecified: Secondary | ICD-10-CM | POA: Diagnosis not present

## 2017-11-18 ENCOUNTER — Telehealth: Payer: Self-pay | Admitting: Internal Medicine

## 2017-11-18 ENCOUNTER — Other Ambulatory Visit: Payer: Self-pay | Admitting: Internal Medicine

## 2017-11-18 NOTE — Telephone Encounter (Signed)
[name] wants to know if patient should continue taking potassium daily or only when taking lasix 20neq daily is what she was taking

## 2017-11-18 NOTE — Telephone Encounter (Signed)
Copied from CRM [id number]. Topic: Quick Communication - See Telephone Encounter >> Nov 18, 2017  1:42 PM Herby Abraham C wrote:     CRM for notification. See Telephone encounter for: [name] -- [facility] nurse @ [phone] called in to be advised. She has being seeing pt and noticed that pt has fluid in her feet/ankle, she would like to have orders for pt to take Tylenol. ALSO she would like to know if pt could take a low dose of lasix with as needed directions.    Please advise.   11/18/17.

## 2017-11-18 NOTE — Telephone Encounter (Signed)
Okay to do lasix 20 mg daily prn for swelling. Okay to take tylenol [phone] mg TID prn up to 3000 mg max daily dosage.

## 2017-11-19 NOTE — Telephone Encounter (Signed)
Would not add potassium unless taking more than 3 times per week, then only on days with lasix.

## 2017-11-19 NOTE — Telephone Encounter (Signed)
[name] was Notified of MD response

## 2017-11-24 ENCOUNTER — Other Ambulatory Visit: Payer: Self-pay | Admitting: Internal Medicine

## 2017-11-28 ENCOUNTER — Other Ambulatory Visit: Payer: Self-pay | Admitting: Internal Medicine

## 2017-12-01 ENCOUNTER — Telehealth: Payer: Self-pay | Admitting: Internal Medicine

## 2017-12-01 NOTE — Telephone Encounter (Signed)
It states, Refill not appropriate - Patient was last seen on 08/2017, Please advise.

## 2017-12-01 NOTE — Telephone Encounter (Signed)
Copied from CRM [id number]. Topic: Quick Communication - See Telephone Encounter >> Dec 01, 2017 11:24 AM Louie Bun, Rosey Bath D wrote: CRM for notification. See Telephone encounter for: 12/01/17. Patient son Reginal Feldhaus called and would like to know why the provider has denied his mom's Potassium Chloride Crys ER 20 mEq Oral 2 times daily? Please call him back [phone].

## 2017-12-01 NOTE — Telephone Encounter (Signed)
Contacted son and informed him that the potassium was supposed to be stopped back in march and that I recently had spoken to hospice nurse karen and informed her that Dr. Okey Dupre did not advise adding the potassium back unless she was taking lasix more than 3 times per week, and then to only take the potassium when taking the lasix. Patients son states that she does not take the lasix that much and understands that she does not need to be taking it. States he will call back if mother starts taking lasix more than 3 times per week.

## 2017-12-05 ENCOUNTER — Other Ambulatory Visit: Payer: Self-pay | Admitting: *Deleted

## 2017-12-05 ENCOUNTER — Telehealth: Payer: Self-pay | Admitting: Internal Medicine

## 2017-12-05 MED ORDER — KLOR-CON M20 20 MEQ PO TBCR: 20.0000 meq | EXTENDED_RELEASE_TABLET | Freq: Two times a day (BID) | ORAL | 1 refills | Status: DC | PRN

## 2017-12-05 MED ORDER — FUROSEMIDE 40 MG PO TABS: 80.0000 mg | ORAL_TABLET | ORAL | 1 refills | Status: DC

## 2017-12-05 NOTE — Telephone Encounter (Signed)
This is fine. Lasix with potassium only

## 2017-12-05 NOTE — Telephone Encounter (Signed)
Copied from CRM [id number]. Topic: Inquiry >> Dec 05, 2017 12:15 PM Alexander Bergeron B wrote: Reason for CRM: mary from hospice and [facility] called to inform she is on Lasix once a day as needed for lower extremity swelling, her son has been giving her potassium 20 meq 2x a day. I told him to give potassium when he gives dose of lasix wanted to make sure its okay w/ Dr. Okey Dupre, contact mary if needed

## 2017-12-05 NOTE — Telephone Encounter (Signed)
Updated med list to reflect MD response...Jessica [name]

## 2017-12-13 ENCOUNTER — Other Ambulatory Visit: Payer: Self-pay | Admitting: Internal Medicine

## 2017-12-19 ENCOUNTER — Other Ambulatory Visit: Payer: Self-pay | Admitting: Internal Medicine

## 2018-01-13 ENCOUNTER — Ambulatory Visit: Payer: Medicare Other | Admitting: Podiatry

## 2018-01-26 ENCOUNTER — Other Ambulatory Visit: Payer: Self-pay | Admitting: *Deleted

## 2018-01-26 MED ORDER — LISINOPRIL 5 MG PO TABS: ORAL_TABLET | ORAL | 2 refills | Status: DC

## 2018-01-29 ENCOUNTER — Other Ambulatory Visit: Payer: Self-pay | Admitting: Internal Medicine

## 2018-02-16 ENCOUNTER — Other Ambulatory Visit: Payer: Self-pay | Admitting: Internal Medicine

## 2018-02-18 ENCOUNTER — Ambulatory Visit (INDEPENDENT_AMBULATORY_CARE_PROVIDER_SITE_OTHER): Payer: Medicare Other | Admitting: Podiatry

## 2018-02-18 ENCOUNTER — Encounter: Payer: Self-pay | Admitting: Podiatry

## 2018-02-18 DIAGNOSIS — M79676 Pain in unspecified toe(s): Secondary | ICD-10-CM

## 2018-02-18 DIAGNOSIS — G629 Polyneuropathy, unspecified: Secondary | ICD-10-CM

## 2018-02-18 DIAGNOSIS — B351 Tinea unguium: Secondary | ICD-10-CM

## 2018-02-18 NOTE — Progress Notes (Signed)
Patient ID: Lurlean Nanny, female   DOB: 1911-04-03, 84 y.o.   MRN: [id number] Complaint:  Visit Type: Patient returns to my office for continued preventative foot care services. Complaint: Patient states" my nails have grown long and thick and become painful to walk and wear shoes.. The patient presents for preventative foot care services. No changes to ROS  Podiatric Exam: Vascular: dorsalis pedis and posterior tibial pulses are not  palpable bilateral due to swelling. Capillary return is immediate.  Sensorium: Normal Semmes Weinstein monofilament test. Normal tactile sensation bilaterally. Nail Exam: Pt has thick disfigured discolored nails with subungual debris noted bilateral entire nail hallux through fifth toenails Ulcer Exam: There is no evidence of ulcer or pre-ulcerative changes or infection. Orthopedic Exam: Muscle tone and strength are WNL. No limitations in general ROM. No crepitus or effusions noted. Foot type and digits show no abnormalities. Bony prominences are unremarkable. Skin: No Porokeratosis. No infection or ulcers  Diagnosis:  Onychomycosis, , Pain in right toe, pain in left toes  Treatment & Plan Procedures and Treatment: Consent by patient was obtained for treatment procedures. The patient understood the discussion of treatment and procedures well. All questions were answered thoroughly reviewed. Debridement of mycotic and hypertrophic toenails, 1 through 5 bilateral and clearing of subungual debris. No ulceration, no infection noted.  Return Visit-Office Procedure: Patient instructed to return to the office for a follow up visit 4 months for continued evaluation and treatment.   Helane Gunther DPM

## 2018-03-05 ENCOUNTER — Telehealth: Payer: Self-pay | Admitting: Internal Medicine

## 2018-03-05 NOTE — Telephone Encounter (Signed)
Copied from CRM [id number]. Topic: Quick Communication - See Telephone Encounter >> Mar 05, 2018  3:37 PM Floria Raveling A wrote: CRM for notification. See Telephone encounter for: 03/05/18.  [name] with [facility] [phone]  [name] was in home with pt and left leg larger then he right.  Pt has been taking furosemide (LASIX) 40 MG tablet [[id number]]  to reduce fluid.  She is wanting to know if she may need to increase lasix to 1 to 2 tab daily?

## 2018-03-06 NOTE — Telephone Encounter (Signed)
Spoke to son Tiburcio Bash and gave him Dr. Lawerance Bach recommendation. Pt son agrees and will call back on Monday if there is no improvement.

## 2018-03-06 NOTE — Telephone Encounter (Signed)
Let them know since one leg is larger than the other which may indicate a blood clot - we could evaluate it further if family wants, but since she is on hospice and we want to keep her comfortable I would recommend increasing the lasix to 2 tabs daily.  If improved can consider decreasing back to 1 tab daily.

## 2018-03-06 NOTE — Telephone Encounter (Signed)
Routing to dr burns, please advise in the absence of dr crawford, thanks 

## 2018-03-11 ENCOUNTER — Ambulatory Visit: Payer: Medicare Other | Admitting: Internal Medicine

## 2018-03-11 DIAGNOSIS — Z0289 Encounter for other administrative examinations: Secondary | ICD-10-CM

## 2018-03-17 ENCOUNTER — Telehealth: Payer: Self-pay | Admitting: Internal Medicine

## 2018-03-17 NOTE — Telephone Encounter (Signed)
Copied from CRM [id number]. Topic: Quick Communication - See Telephone Encounter >> Mar 17, 2018  9:22 AM Oneal Grout wrote: CRM for notification. See Telephone encounter for: 03/17/18. Hospice calling swelling in lower extremities has decreased with help of lasix. Urine output is slowing down. Request advise of change of lasix 1-2 tabs daily as needed. Please avise

## 2018-03-17 NOTE — Telephone Encounter (Signed)
Advise 1 pill daily as needed for swelling only.

## 2018-03-17 NOTE — Telephone Encounter (Signed)
LVM informing karen of MD response

## 2018-04-22 ENCOUNTER — Telehealth: Payer: Self-pay | Admitting: Internal Medicine

## 2018-04-22 NOTE — Telephone Encounter (Signed)
Copied from CRM [id number]. Topic: Quick Communication - See Telephone Encounter >> Apr 22, 2018  2:13 PM Arlyss Gandy, NT wrote: CRM for notification. See Telephone encounter for: 04/22/18. [name] with [facility] is needing a verbal order to re certify this pts hospice care. CB#: [phone]

## 2018-04-23 NOTE — Telephone Encounter (Signed)
Fine

## 2018-04-23 NOTE — Telephone Encounter (Signed)
Verbals given  

## 2018-04-30 ENCOUNTER — Other Ambulatory Visit: Payer: Self-pay | Admitting: Cardiovascular Disease

## 2018-05-07 ENCOUNTER — Non-Acute Institutional Stay (SKILLED_NURSING_FACILITY): Admitting: Internal Medicine

## 2018-05-07 ENCOUNTER — Encounter: Payer: Self-pay | Admitting: Internal Medicine

## 2018-05-07 DIAGNOSIS — R627 Adult failure to thrive: Secondary | ICD-10-CM

## 2018-05-07 DIAGNOSIS — I10 Essential (primary) hypertension: Secondary | ICD-10-CM

## 2018-05-07 DIAGNOSIS — F0390 Unspecified dementia without behavioral disturbance: Secondary | ICD-10-CM | POA: Diagnosis not present

## 2018-05-07 NOTE — Patient Instructions (Signed)
See assessment and plan under each diagnosis in the problem list and acutely for this visit 

## 2018-05-07 NOTE — Progress Notes (Signed)
NURSING HOME LOCATION:  [facility] ROOM NUMBER:  [id number]  CODE STATUS:  DNR  PCP:  Myrlene Broker, MD  [address] [place] [address]  This is a  Hospice/Respite admission note to [facility] performed on this date less than 30 days from date of admission. Included are preadmission medical/surgical history;reconciled medication list; family history; social history and comprehensive review of systems.  Corrections and additions to the records were documented. Comprehensive physical exam was also performed. Additionally a clinical summary was entered for each active diagnosis pertinent to this admission in the Problem List to enhance continuity of care.  HPI:   Records provided by Hospice  document diagnoses of unspecified heart disease ad heart failure, chronic kidney diease , essential hypertension, ad cognitive deficit. Other diagnoses gleaned from Epic include peripheral neuropathy, history of stroke , dyslipidemia , GERD,and history of of diverticulosis. Surgeries include abdominal hysterectomy.  Social history:nondrinker, never smked  Family history: noncontributory due to age   Review of systems:  Her son Tiburcio Bash states that she's been excessively somnolent over the last 1-2 months. There is no associated excessive snoring or sleep apnea phenomena. Last TSH on record was therapeutic at 2.48 on 08/29/14. Her level of activity is also decreased and she is sedentary for  the most part. She will walk with her walker and other times use a wheelchair. A sitter is present in the home 5 hours a day. The patient gave the date as 05/12/2018 but was unable to name the president. She was unable to give me her son's birth date.   Constitutional: No fever,significant weight change  Eyes: No redness, discharge, pain, vision change ENT/mouth: No nasal congestion, purulent discharge, earache, change in hearing, sore throat  Cardiovascular: No chest pain, palpitations,  paroxysmal nocturnal dyspnea, claudication, edema  Respiratory: No cough, sputum production, hemoptysis, DOE Gastrointestinal: No heartburn, dysphagia, abdominal pain, nausea /vomiting, rectal bleeding, melena, change in bowels Genitourinary: No dysuria, hematuria, pyuria, incontinence, nocturia Musculoskeletal: No joint stiffness, joint swelling, pain Dermatologic: No rash, pruritus, change in appearance of skin Neurologic: No dizziness, headache, syncope, seizures, numbness, tingling Psychiatric: No significant anxiety, depression, insomnia, anorexia Endocrine: No change in hair/skin/ nails, excessive thirst, excessive hunger, excessive urination  Hematologic/lymphatic: No significant bruising, lymphadenopathy, abnormal bleeding Allergy/immunology: No itchy/watery eyes, significant sneezing, urticaria, angioedema  Physical exam:  Pertinent or positive findings: She appears younger than her stated age. She does appear somewhat cachectic with wasting of the temple & extremity musculature. Dental work is very good.She exhibits an intermittent minor nonproductive cough. A grade 1 systolic murmur is noted at the right base. Bilateral carotid bruits, louder on the left. She has minor rales in the lower lobes. She has 1+ edema to the sock line. Pedal pulses are decreased. She's weak to opposition in all extremities.  General appearance: no acute distress, increased work of breathing is present.   Lymphatic: No lymphadenopathy about the head, neck, axilla. Eyes: No conjunctival inflammation or lid edema is present. There is no scleral icterus. Ears:  External ear exam shows no significant lesions or deformities.   Nose:  External nasal examination shows no deformity or inflammation. Nasal mucosa are pink and moist without lesions, exudates Oral exam: Lips and gums are healthy appearing.There is no oropharyngeal erythema or exudate. Neck:  No thyromegaly, masses, tenderness noted.    Heart:  Normal  rate and regular rhythm. S1 and S2 normal without gallop, click, rub .  Lungs:  without wheezes, rhonchi,  rubs. Abdomen: Bowel sounds are normal.  Abdomen is soft and nontender with no organomegaly, hernias, masses. GU: Deferred  Extremities:  No cyanosis, clubbing  Neurologic exam:  Balance, Rhomberg, finger to nose testing could not be completed due to clinical state Deep tendon reflexes are equal in UE Skin: Warm & dry w/o tenting. No significant lesions or rash.  See clinical summary under each active problem in the Problem List with associated updated therapeutic plan

## 2018-05-07 NOTE — Assessment & Plan Note (Signed)
BP controlled on low dose ACE-I ; no change in antihypertensive medications  

## 2018-05-07 NOTE — Assessment & Plan Note (Signed)
Son describes excess somnolence 1-2 months; defer to PCP and Hospice as to updating TSH

## 2018-05-07 NOTE — Assessment & Plan Note (Addendum)
05/07/18 : date given as 05/12/2018 but unable to name the President or correctly identify her son's date of birth  Clinically question need for Aricept @ 104, but I defer to Hospice

## 2018-05-18 ENCOUNTER — Telehealth: Payer: Self-pay | Admitting: Emergency Medicine

## 2018-05-18 NOTE — Telephone Encounter (Signed)
Called patient to schedule AWV. Patient will call back at later date to schedule. 

## 2018-06-03 ENCOUNTER — Other Ambulatory Visit: Payer: Self-pay | Admitting: Cardiovascular Disease

## 2018-06-17 ENCOUNTER — Ambulatory Visit: Payer: Medicare Other | Admitting: Podiatry

## 2018-06-17 ENCOUNTER — Telehealth: Payer: Self-pay | Admitting: Internal Medicine

## 2018-06-17 NOTE — Telephone Encounter (Signed)
Copied from CRM [id number]. Topic: Quick Communication - See Telephone Encounter >> Jun 17, 2018 12:12 PM Herby Abraham C wrote: CRM for notification. See Telephone encounter for: 06/17/18.  Pt's primary RN [name]) at hospice called in for verbal orders for Continuation of hospice care.  -- [phone]

## 2018-06-19 NOTE — Telephone Encounter (Signed)
fine

## 2018-06-19 NOTE — Telephone Encounter (Signed)
Left vm to call back for verbal orders to continue hospice.

## 2018-07-16 ENCOUNTER — Other Ambulatory Visit: Payer: Self-pay | Admitting: Cardiovascular Disease

## 2018-07-16 NOTE — Telephone Encounter (Signed)
Rx sent to pharmacy   

## 2018-09-08 ENCOUNTER — Non-Acute Institutional Stay (SKILLED_NURSING_FACILITY): Admitting: Internal Medicine

## 2018-09-08 ENCOUNTER — Encounter: Payer: Self-pay | Admitting: Internal Medicine

## 2018-09-08 DIAGNOSIS — I5042 Chronic combined systolic (congestive) and diastolic (congestive) heart failure: Secondary | ICD-10-CM | POA: Diagnosis not present

## 2018-09-08 DIAGNOSIS — R627 Adult failure to thrive: Secondary | ICD-10-CM | POA: Diagnosis not present

## 2018-09-08 NOTE — Assessment & Plan Note (Signed)
Chronic debilitation as expected without acute decompensation Hospice/respite care appropriate

## 2018-09-08 NOTE — Progress Notes (Signed)
NURSING HOME LOCATION:  [facility] ROOM NUMBER:  [id number]  CODE STATUS:  DNR  PCP:  Myrlene Broker, MD  [address] [place] [address]  This is a comprehensive admission note to [facility] performed on this date less than 30 days from date of admission.  Included are preadmission medical/surgical history; reconciled medication list; family history; social history and comprehensive review of systems.   Corrections and additions to the records were documented. Comprehensive physical exam was also performed. Additionally a clinical summary was entered for each active diagnosis pertinent to this admission in the Problem List to enhance continuity of care.  HPI: The patient is 84 year old who was admitted to the SNF for Hospice respite.  Diagnoses include adult failure to thrive, history of stroke, peripheral neuropathy, GERD, history of diverticulosis, and dyslipidemia.  She has had a hysterectomy.  Social history: Never smoked and did not drink.  Family history: Obviously noncontributory due to age   Review of systems: Patient has no complaints other than she wants dental assessment as "the places (in teeth) catch food on the right".  Although somnolent she is essentially oriented giving the date as September 04, 2018.  Constitutional: No fever, significant weight change, fatigue  Eyes: No redness, discharge, pain, vision change ENT/mouth: No nasal congestion, purulent discharge, earache, change in hearing, sore throat  Cardiovascular: No chest pain, palpitations, paroxysmal nocturnal dyspnea, claudication, edema  Respiratory: No cough, sputum production, hemoptysis, DOE, significant snoring, apnea Gastrointestinal: No heartburn, dysphagia, abdominal pain, nausea /vomiting, rectal bleeding, melena, change in bowels Genitourinary: No dysuria, hematuria, pyuria, incontinence, nocturia Musculoskeletal: No joint stiffness, joint swelling, weakness,  pain Dermatologic: No rash, pruritus, change in appearance of skin Neurologic: No dizziness, headache, syncope, seizures, numbness, tingling Psychiatric: No significant anxiety, depression, insomnia, anorexia Endocrine: No change in hair/skin/nails, excessive thirst, excessive hunger, excessive urination  Hematologic/lymphatic: No significant bruising, lymphadenopathy, abnormal bleeding Allergy/immunology: No itchy/watery eyes, significant sneezing, urticaria, angioedema  Physical exam:  Pertinent or positive findings: Dental care and hygiene is surprisingly good.  There are some gaps between the teeth but no obvious decay or erosions are noted.  She lies in bed in the left lateral decubitus position.  She appears frail and chronically undernourished.  She has bilateral ptosis.  There is temporal wasting.  Heart sounds are markedly distant and irregular.  Intermittent low-grade flow murmur suggested.  Breath sounds are decreased.  There is marked atrophy of the limbs.  She has profound weakness diffusely.  Pedal pulses are decreased.  General appearance: no acute distress, increased work of breathing is present.   Lymphatic: No lymphadenopathy about the head, neck, axilla. Eyes: No conjunctival inflammation or lid edema is present. There is no scleral icterus. Ears:  External ear exam shows no significant lesions or deformities.   Nose:  External nasal examination shows no deformity or inflammation. Nasal mucosa are pink and moist without lesions, exudates Oral exam: Lips and gums are healthy appearing.There is no oropharyngeal erythema or exudate. Neck:  No thyromegaly, masses, tenderness noted.    Heart:  No gallop, click, rub.  Lungs:  without wheezes, rhonchi, rales, rubs. Abdomen: Bowel sounds are normal.  Abdomen is soft and nontender with no organomegaly, hernias, masses. GU: Deferred  Extremities:  No cyanosis, clubbing, edema. Neurologic exam:  Balance, Rhomberg, finger to nose  testing could not be completed due to clinical state Skin: Warm & dry w/o tenting. No significant lesions or rash.  See clinical summary under each  active problem in the Problem List with associated updated therapeutic plan

## 2018-09-08 NOTE — Patient Instructions (Signed)
See assessment and plan under each diagnosis in the problem list and acutely for this visit 

## 2018-09-08 NOTE — Assessment & Plan Note (Addendum)
09/08/2018 clinically heart failure is compensated.  Clinically possible A. fib is suggested with pulse deficit & controlled rate.

## 2018-09-09 ENCOUNTER — Encounter: Payer: Self-pay | Admitting: Internal Medicine

## 2018-09-30 ENCOUNTER — Telehealth: Payer: Self-pay | Admitting: Internal Medicine

## 2018-09-30 NOTE — Telephone Encounter (Signed)
Error

## 2018-11-11 ENCOUNTER — Other Ambulatory Visit: Payer: Self-pay | Admitting: Internal Medicine

## 2018-12-13 ENCOUNTER — Other Ambulatory Visit: Payer: Self-pay | Admitting: Internal Medicine

## 2019-05-31 ENCOUNTER — Telehealth: Payer: Self-pay | Admitting: Internal Medicine

## 2019-05-31 NOTE — Telephone Encounter (Signed)
I suspect this is a patient I had contact with prior to start of Epic since there are no recent records; she would not be an established patient any longer if that ws the case, but thanks for the update

## 2019-05-31 NOTE — Telephone Encounter (Signed)
Routing to dr john, fyi 

## 2019-05-31 NOTE — Telephone Encounter (Signed)
Copied from [place] [id number]. Topic: General - Other >> May 31, 2019  2:44 PM Berneta Levins wrote: Reason for CRM:   Santiago Glad from [facility] calling to notify PCP (states that is Dr. Jenny Reichmann - but I do not see that in [place]) that pt was transferred to [facility] for end of life care today. Santiago Glad can be reached at [phone]

## 2019-06-01 ENCOUNTER — Telehealth: Payer: Self-pay

## 2019-06-01 NOTE — Telephone Encounter (Signed)
Informed that I do not see any forms that needed to be signed and they are faxing them over again

## 2019-06-01 NOTE — Telephone Encounter (Signed)
Copied from [place] [id number]. Topic: Quick Communication - Home Health Verbal Orders >> May 31, 2019  1:06 PM Carolyn Stare wrote: Caller/Agency Lockie Mola with Hospice  Callback Number [phone]  Call to say plan of care orders was sent over on 05/04/2019 and she is requesting the orders to be sent back to her

## 2019-06-01 NOTE — Telephone Encounter (Signed)
Appears this CRM was linked to the incorrect chart when the call was taken. The date of birth that [name] has on file does not match what we have on file. CORRECT MRN: [id number] Jessica [name] November 05, 1911

## 2019-06-02 NOTE — Telephone Encounter (Signed)
Routing to dr john, fyi 

## 2019-06-07 ENCOUNTER — Telehealth: Payer: Self-pay

## 2019-06-07 NOTE — Telephone Encounter (Signed)
Received dc from [employer] DC is for burial and a patient of Doctor Crawford.  DC will be taken to [facility] [place] for signature.  On 06/08/2019 Received dc back from Doctor Sharlet [place].  I called the funeral home to let them know the dc is read for pickup.

## 2019-06-16 NOTE — Telephone Encounter (Signed)
[place] [facility] called back to check the status of forms.  Please advise. Call back # [phone] Please Fax to # [phone]

## 2019-06-16 NOTE — Telephone Encounter (Signed)
Noted thanks °

## 2019-06-16 NOTE — Telephone Encounter (Signed)
Placed in MD folder to look over and sign

## 2019-06-16 NOTE — Telephone Encounter (Signed)
PCP is Dr. Crawford 

## 2019-06-16 NOTE — Telephone Encounter (Signed)
fyi

## 2019-06-16 DEATH — deceased
